# Patient Record
Sex: Female | Born: 1970
Health system: Southern US, Community
[De-identification: ages and names within clinical notes are randomized; demographics above are authoritative.]

## PROBLEM LIST (undated history)

## (undated) DIAGNOSIS — K594 Anal spasm: Secondary | ICD-10-CM

## (undated) DIAGNOSIS — G4730 Sleep apnea, unspecified: Secondary | ICD-10-CM

## (undated) DIAGNOSIS — M545 Low back pain, unspecified: Secondary | ICD-10-CM

## (undated) DIAGNOSIS — M1990 Unspecified osteoarthritis, unspecified site: Secondary | ICD-10-CM

## (undated) DIAGNOSIS — K219 Gastro-esophageal reflux disease without esophagitis: Secondary | ICD-10-CM

## (undated) DIAGNOSIS — K2960 Other gastritis without bleeding: Secondary | ICD-10-CM

## (undated) DIAGNOSIS — N809 Endometriosis, unspecified: Secondary | ICD-10-CM

## (undated) DIAGNOSIS — G4733 Obstructive sleep apnea (adult) (pediatric): Secondary | ICD-10-CM

## (undated) DIAGNOSIS — G8929 Other chronic pain: Secondary | ICD-10-CM

## (undated) DIAGNOSIS — R519 Headache, unspecified: Secondary | ICD-10-CM

## (undated) DIAGNOSIS — G43909 Migraine, unspecified, not intractable, without status migrainosus: Secondary | ICD-10-CM

## (undated) DIAGNOSIS — K644 Residual hemorrhoidal skin tags: Secondary | ICD-10-CM

## (undated) DIAGNOSIS — N83202 Unspecified ovarian cyst, left side: Secondary | ICD-10-CM

## (undated) DIAGNOSIS — T7840XA Allergy, unspecified, initial encounter: Secondary | ICD-10-CM

## (undated) DIAGNOSIS — E079 Disorder of thyroid, unspecified: Secondary | ICD-10-CM

## (undated) DIAGNOSIS — D649 Anemia, unspecified: Secondary | ICD-10-CM

## (undated) DIAGNOSIS — J45909 Unspecified asthma, uncomplicated: Secondary | ICD-10-CM

## (undated) DIAGNOSIS — Z9989 Dependence on other enabling machines and devices: Secondary | ICD-10-CM

## (undated) DIAGNOSIS — R51 Headache: Secondary | ICD-10-CM

## (undated) DIAGNOSIS — Z87898 Personal history of other specified conditions: Secondary | ICD-10-CM

## (undated) HISTORY — DX: Allergy, unspecified, initial encounter: T78.40XA

## (undated) HISTORY — DX: Anal spasm: K59.4

## (undated) HISTORY — DX: Unspecified ovarian cyst, left side: N83.202

## (undated) HISTORY — DX: Residual hemorrhoidal skin tags: K64.4

## (undated) HISTORY — DX: Unspecified asthma, uncomplicated: J45.909

## (undated) HISTORY — DX: Other gastritis without bleeding: K29.60

## (undated) HISTORY — PX: NASAL SEPTUM SURGERY: SHX37

## (undated) HISTORY — DX: Disorder of thyroid, unspecified: E07.9

## (undated) HISTORY — PX: LAPAROSCOPIC OVARIAN CYSTECTOMY: SUR786

## (undated) HISTORY — DX: Anemia, unspecified: D64.9

## (undated) HISTORY — PX: TEMPOROMANDIBULAR JOINT SURGERY: SHX35

## (undated) HISTORY — DX: Endometriosis, unspecified: N80.9

## (undated) HISTORY — DX: Personal history of other specified conditions: Z87.898

## (undated) HISTORY — PX: COLONOSCOPY: SHX174

## (undated) HISTORY — DX: Sleep apnea, unspecified: G47.30

## (undated) HISTORY — PX: UPPER GASTROINTESTINAL ENDOSCOPY: SHX188

---
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
## 1997-10-06 ENCOUNTER — Ambulatory Visit (HOSPITAL_COMMUNITY): Admission: RE | Admit: 1997-10-06 | Discharge: 1997-10-06 | Payer: Self-pay | Admitting: Obstetrics and Gynecology

## 1997-10-16 ENCOUNTER — Inpatient Hospital Stay (HOSPITAL_COMMUNITY): Admission: AD | Admit: 1997-10-16 | Discharge: 1997-10-16 | Payer: Self-pay | Admitting: Obstetrics and Gynecology

## 1997-10-23 ENCOUNTER — Inpatient Hospital Stay (HOSPITAL_COMMUNITY): Admission: AD | Admit: 1997-10-23 | Discharge: 1997-10-25 | Payer: Self-pay | Admitting: Obstetrics and Gynecology

## 1998-01-31 ENCOUNTER — Ambulatory Visit (HOSPITAL_COMMUNITY): Admission: RE | Admit: 1998-01-31 | Discharge: 1998-01-31 | Payer: Self-pay | Admitting: Obstetrics and Gynecology

## 1998-06-06 ENCOUNTER — Emergency Department (HOSPITAL_COMMUNITY): Admission: EM | Admit: 1998-06-06 | Discharge: 1998-06-06 | Payer: Self-pay | Admitting: Emergency Medicine

## 2000-04-10 ENCOUNTER — Encounter: Admission: RE | Admit: 2000-04-10 | Discharge: 2000-04-10 | Payer: Self-pay | Admitting: Surgery

## 2000-04-10 ENCOUNTER — Encounter: Payer: Self-pay | Admitting: Surgery

## 2000-10-01 ENCOUNTER — Encounter: Payer: Self-pay | Admitting: Surgery

## 2000-10-01 ENCOUNTER — Encounter: Admission: RE | Admit: 2000-10-01 | Discharge: 2000-10-01 | Payer: Self-pay | Admitting: Surgery

## 2000-10-05 ENCOUNTER — Other Ambulatory Visit: Admission: RE | Admit: 2000-10-05 | Discharge: 2000-10-05 | Payer: Self-pay | Admitting: Surgery

## 2000-10-05 ENCOUNTER — Encounter: Admission: RE | Admit: 2000-10-05 | Discharge: 2000-10-05 | Payer: Self-pay | Admitting: Surgery

## 2000-10-05 ENCOUNTER — Encounter: Payer: Self-pay | Admitting: Surgery

## 2000-10-05 ENCOUNTER — Encounter (INDEPENDENT_AMBULATORY_CARE_PROVIDER_SITE_OTHER): Payer: Self-pay | Admitting: *Deleted

## 2000-10-05 HISTORY — PX: BREAST BIOPSY: SHX20

## 2000-10-14 ENCOUNTER — Emergency Department (HOSPITAL_COMMUNITY): Admission: EM | Admit: 2000-10-14 | Discharge: 2000-10-15 | Payer: Self-pay | Admitting: Emergency Medicine

## 2000-10-15 ENCOUNTER — Encounter: Payer: Self-pay | Admitting: Emergency Medicine

## 2003-08-30 ENCOUNTER — Ambulatory Visit (HOSPITAL_COMMUNITY): Admission: RE | Admit: 2003-08-30 | Discharge: 2003-08-30 | Payer: Self-pay | Admitting: Family Medicine

## 2007-07-27 ENCOUNTER — Emergency Department (HOSPITAL_COMMUNITY): Admission: EM | Admit: 2007-07-27 | Discharge: 2007-07-27 | Payer: Self-pay | Admitting: Emergency Medicine

## 2007-11-04 ENCOUNTER — Ambulatory Visit (HOSPITAL_COMMUNITY): Admission: EM | Admit: 2007-11-04 | Discharge: 2007-11-04 | Payer: Self-pay | Admitting: Emergency Medicine

## 2007-11-07 ENCOUNTER — Emergency Department (HOSPITAL_COMMUNITY): Admission: EM | Admit: 2007-11-07 | Discharge: 2007-11-07 | Payer: Self-pay | Admitting: Emergency Medicine

## 2007-11-09 ENCOUNTER — Ambulatory Visit (HOSPITAL_COMMUNITY): Admission: RE | Admit: 2007-11-09 | Discharge: 2007-11-09 | Payer: Self-pay | Admitting: Internal Medicine

## 2007-11-10 ENCOUNTER — Ambulatory Visit (HOSPITAL_COMMUNITY): Admission: RE | Admit: 2007-11-10 | Discharge: 2007-11-10 | Payer: Self-pay | Admitting: Internal Medicine

## 2007-11-11 DIAGNOSIS — N83209 Unspecified ovarian cyst, unspecified side: Secondary | ICD-10-CM | POA: Insufficient documentation

## 2007-11-15 ENCOUNTER — Ambulatory Visit: Payer: Self-pay | Admitting: Gastroenterology

## 2007-11-15 DIAGNOSIS — K219 Gastro-esophageal reflux disease without esophagitis: Secondary | ICD-10-CM | POA: Insufficient documentation

## 2007-11-15 DIAGNOSIS — R079 Chest pain, unspecified: Secondary | ICD-10-CM | POA: Insufficient documentation

## 2007-11-15 DIAGNOSIS — R1011 Right upper quadrant pain: Secondary | ICD-10-CM | POA: Insufficient documentation

## 2007-11-15 DIAGNOSIS — K5900 Constipation, unspecified: Secondary | ICD-10-CM | POA: Insufficient documentation

## 2007-11-16 ENCOUNTER — Encounter: Payer: Self-pay | Admitting: Internal Medicine

## 2007-11-16 ENCOUNTER — Ambulatory Visit: Payer: Self-pay | Admitting: Gastroenterology

## 2007-12-07 ENCOUNTER — Telehealth: Payer: Self-pay | Admitting: Gastroenterology

## 2008-01-06 ENCOUNTER — Ambulatory Visit: Payer: Self-pay | Admitting: Gastroenterology

## 2008-01-06 DIAGNOSIS — R109 Unspecified abdominal pain: Secondary | ICD-10-CM | POA: Insufficient documentation

## 2008-01-06 DIAGNOSIS — F411 Generalized anxiety disorder: Secondary | ICD-10-CM | POA: Insufficient documentation

## 2008-01-06 DIAGNOSIS — K594 Anal spasm: Secondary | ICD-10-CM | POA: Insufficient documentation

## 2008-01-10 ENCOUNTER — Telehealth: Payer: Self-pay | Admitting: Gastroenterology

## 2008-01-11 ENCOUNTER — Telehealth: Payer: Self-pay | Admitting: Gastroenterology

## 2008-01-11 ENCOUNTER — Ambulatory Visit: Payer: Self-pay | Admitting: Gastroenterology

## 2008-01-24 ENCOUNTER — Emergency Department (HOSPITAL_COMMUNITY): Admission: EM | Admit: 2008-01-24 | Discharge: 2008-01-24 | Payer: Self-pay | Admitting: Emergency Medicine

## 2008-02-23 ENCOUNTER — Encounter: Admission: RE | Admit: 2008-02-23 | Discharge: 2008-04-24 | Payer: Self-pay | Admitting: Neurology

## 2008-06-19 ENCOUNTER — Emergency Department (HOSPITAL_COMMUNITY): Admission: EM | Admit: 2008-06-19 | Discharge: 2008-06-19 | Payer: Self-pay | Admitting: Emergency Medicine

## 2008-07-25 ENCOUNTER — Telehealth: Payer: Self-pay | Admitting: Gastroenterology

## 2008-08-08 ENCOUNTER — Telehealth: Payer: Self-pay | Admitting: Internal Medicine

## 2008-08-08 ENCOUNTER — Encounter: Payer: Self-pay | Admitting: Gastroenterology

## 2008-08-10 ENCOUNTER — Ambulatory Visit: Payer: Self-pay | Admitting: Gastroenterology

## 2008-08-10 DIAGNOSIS — K2970 Gastritis, unspecified, without bleeding: Secondary | ICD-10-CM | POA: Insufficient documentation

## 2008-08-10 DIAGNOSIS — K2990 Gastroduodenitis, unspecified, without bleeding: Secondary | ICD-10-CM

## 2008-10-09 ENCOUNTER — Emergency Department (HOSPITAL_COMMUNITY): Admission: EM | Admit: 2008-10-09 | Discharge: 2008-10-09 | Payer: Self-pay | Admitting: Emergency Medicine

## 2008-11-10 ENCOUNTER — Ambulatory Visit: Payer: Self-pay | Admitting: Pulmonary Disease

## 2008-11-10 ENCOUNTER — Telehealth: Payer: Self-pay | Admitting: Pulmonary Disease

## 2008-11-10 DIAGNOSIS — R091 Pleurisy: Secondary | ICD-10-CM | POA: Insufficient documentation

## 2008-11-16 LAB — CONVERTED CEMR LAB: Anti Nuclear Antibody(ANA): NEGATIVE

## 2009-01-15 ENCOUNTER — Encounter: Admission: RE | Admit: 2009-01-15 | Discharge: 2009-01-15 | Payer: Self-pay | Admitting: Surgery

## 2010-05-16 ENCOUNTER — Emergency Department (HOSPITAL_COMMUNITY)
Admission: EM | Admit: 2010-05-16 | Discharge: 2010-05-16 | Payer: Self-pay | Source: Home / Self Care | Admitting: Emergency Medicine

## 2010-08-20 LAB — COMPREHENSIVE METABOLIC PANEL
ALT: 13 U/L (ref 0–35)
Alkaline Phosphatase: 80 U/L (ref 39–117)
BUN: 8 mg/dL (ref 6–23)
CO2: 28 mEq/L (ref 19–32)
Chloride: 107 mEq/L (ref 96–112)
GFR calc non Af Amer: >60 mL/min (ref >60)
Glucose, Bld: 95 mg/dL (ref 70–99)
Potassium: 4 mEq/L (ref 3.5–5.1)
Sodium: 140 mEq/L (ref 135–145)
Total Bilirubin: 0.4 mg/dL (ref 0.3–1.2)

## 2010-08-20 LAB — POCT CARDIAC MARKERS: Myoglobin, poc: 49.8 ng/mL (ref 12–200)

## 2010-09-23 LAB — POCT I-STAT, CHEM 8
BUN: 13 mg/dL (ref 6–23)
Calcium, Ion: 1.14 mmol/L (ref 1.12–1.32)
Chloride: 105 mEq/L (ref 96–112)
Glucose, Bld: 91 mg/dL (ref 70–99)
TCO2: 23 mmol/L (ref 0–100)

## 2010-09-23 LAB — CBC
HCT: 34.6 % — ABNORMAL LOW (ref 36.0–46.0)
MCHC: 33.9 g/dL (ref 30.0–36.0)
MCV: 80.5 fL (ref 78.0–100.0)
Platelets: 358 10*3/uL (ref 150–400)

## 2010-09-23 LAB — DIFFERENTIAL
Basophils Relative: 0 % (ref 0–1)
Eosinophils Absolute: 0.2 10*3/uL (ref 0.0–0.7)
Eosinophils Relative: 2 % (ref 0–5)
Lymphs Abs: 1.7 10*3/uL (ref 0.7–4.0)
Monocytes Relative: 7 % (ref 3–12)
Neutrophils Relative %: 73 % (ref 43–77)

## 2010-09-23 LAB — URINALYSIS, ROUTINE W REFLEX MICROSCOPIC
Glucose, UA: NEGATIVE mg/dL
Hgb urine dipstick: NEGATIVE
Ketones, ur: NEGATIVE mg/dL
Protein, ur: NEGATIVE mg/dL
Urobilinogen, UA: 0.2 mg/dL (ref 0.0–1.0)

## 2010-09-23 LAB — POCT PREGNANCY, URINE: Preg Test, Ur: NEGATIVE

## 2011-02-18 ENCOUNTER — Emergency Department (HOSPITAL_BASED_OUTPATIENT_CLINIC_OR_DEPARTMENT_OTHER)
Admission: EM | Admit: 2011-02-18 | Discharge: 2011-02-18 | Disposition: A | Discharge status: Home / Self Care | Payer: BC Managed Care – PPO | Attending: Emergency Medicine | Admitting: Emergency Medicine

## 2011-02-18 ENCOUNTER — Emergency Department (INDEPENDENT_AMBULATORY_CARE_PROVIDER_SITE_OTHER): Payer: BC Managed Care – PPO

## 2011-02-18 ENCOUNTER — Encounter (HOSPITAL_BASED_OUTPATIENT_CLINIC_OR_DEPARTMENT_OTHER): Payer: Self-pay | Admitting: Emergency Medicine

## 2011-02-18 DIAGNOSIS — W208XXA Other cause of strike by thrown, projected or falling object, initial encounter: Secondary | ICD-10-CM | POA: Insufficient documentation

## 2011-02-18 DIAGNOSIS — R110 Nausea: Secondary | ICD-10-CM

## 2011-02-18 DIAGNOSIS — R42 Dizziness and giddiness: Secondary | ICD-10-CM

## 2011-02-18 DIAGNOSIS — S060X9A Concussion with loss of consciousness of unspecified duration, initial encounter: Secondary | ICD-10-CM

## 2011-02-18 DIAGNOSIS — H538 Other visual disturbances: Secondary | ICD-10-CM

## 2011-02-18 DIAGNOSIS — S098XXA Other specified injuries of head, initial encounter: Secondary | ICD-10-CM

## 2011-02-18 DIAGNOSIS — R51 Headache: Secondary | ICD-10-CM

## 2011-02-18 DIAGNOSIS — S060XAA Concussion with loss of consciousness status unknown, initial encounter: Secondary | ICD-10-CM | POA: Insufficient documentation

## 2011-02-18 LAB — CBC
HCT: 37 % (ref 36.0–46.0)
MCH: 26.7 pg (ref 26.0–34.0)
MCHC: 32.7 g/dL (ref 30.0–36.0)
MCV: 81.5 fL (ref 78.0–100.0)
Platelets: 387 10*3/uL (ref 150–400)
RDW: 14.7 % (ref 11.5–15.5)

## 2011-02-18 LAB — DIFFERENTIAL
Basophils Absolute: 0 10*3/uL (ref 0.0–0.1)
Basophils Relative: 1 % (ref 0–1)
Eosinophils Absolute: 0.2 10*3/uL (ref 0.0–0.7)
Eosinophils Relative: 2 % (ref 0–5)
Monocytes Absolute: 0.6 10*3/uL (ref 0.1–1.0)

## 2011-02-18 MED ORDER — HYDROCODONE-ACETAMINOPHEN 5-500 MG PO TABS: 1.0000 | ORAL_TABLET | Freq: Four times a day (QID) | ORAL | Status: AC | PRN

## 2011-02-18 MED ORDER — SODIUM CHLORIDE 0.9 % IV BOLUS (SEPSIS)
1000.0000 mL | Freq: Once | INTRAVENOUS | Status: AC
  Administered 2011-02-18: 1000 mL via INTRAVENOUS

## 2011-02-18 MED ORDER — ONDANSETRON HCL 4 MG/2ML IJ SOLN
4.0000 mg | Freq: Once | INTRAMUSCULAR | Status: AC
  Administered 2011-02-18: 4 mg via INTRAVENOUS
  Filled 2011-02-18: qty 2

## 2011-02-18 MED ORDER — KETOROLAC TROMETHAMINE 30 MG/ML IJ SOLN
30.0000 mg | Freq: Once | INTRAMUSCULAR | Status: AC
  Administered 2011-02-18: 30 mg via INTRAVENOUS
  Filled 2011-02-18: qty 1

## 2011-02-18 MED ORDER — IBUPROFEN 600 MG PO TABS: 600.0000 mg | ORAL_TABLET | Freq: Four times a day (QID) | ORAL | Status: AC | PRN

## 2011-02-18 MED ORDER — MORPHINE SULFATE 4 MG/ML IJ SOLN
4.0000 mg | Freq: Once | INTRAMUSCULAR | Status: AC
  Administered 2011-02-18: 4 mg via INTRAVENOUS
  Filled 2011-02-18: qty 1

## 2011-02-18 MED ORDER — ONDANSETRON 4 MG PO TBDP: 4.0000 mg | ORAL_TABLET | Freq: Three times a day (TID) | ORAL | Status: AC | PRN

## 2011-02-18 NOTE — ED Provider Notes (Signed)
History     CSN: [id number] Arrival date & time: 02/18/2011 10:51 AM  Chief Complaint  Patient presents with  . Head Injury    Pt reports being hit by solid glass to R side of Body and head   HPI Comments: 40yoF previously healthy pw headache and nausea. Per pt 2 days ago she was working on [place] cabinet and the door fell on her back/occiput. The glass did shatter. C/O headache since that time, diffuse. Worse with movement. +Nausea, no vomiting. Denies numbness/tingling/weakness extremities. C/O min numbness R face. C/O diffuse upper back soreness +laceration to R shoulder which is now healing. Denies change in vision, fever/chills/neck stiffness. No recent illness. Family requesting check CBC as family member recently died 2 days after being diagnosed with leukemia Tet UTD. The injury was not to the Rt side of the body as noted below  Marinell Blight, NT 02/18/2011 10:45     Pt reports Blunt injury to R side of Body with increased headache and body aches    Patient is a 40 y.o. female presenting with head injury.  Head Injury     History reviewed. No pertinent past medical history.  Past Surgical History  Procedure Date  . Cesarean section     History reviewed. No pertinent family history.  History  Substance Use Topics  . Smoking status: Never Smoker   . Smokeless tobacco: Not on file  . Alcohol Use: No    OB History    Grav Para Term Preterm Abortions TAB SAB Ect Mult Living                  Review of Systems  All other systems reviewed and are negative.  except as noted HPI   Physical Exam  BP 127/77  Pulse 88  Temp(Src) 98.6 F (37 C) (Oral)  Resp 18  SpO2 100%  LMP 02/18/2011  Physical Exam  Nursing note and vitals reviewed. Constitutional: She is oriented to person, place, and time. She appears well-developed.  HENT:  Head: Normocephalic and atraumatic.  Right Ear: External ear normal.  Left Ear: External ear normal.  Nose: Nose normal.    Mouth/Throat: Oropharynx is clear and moist. No oropharyngeal exudate.       No scalp hematoma +ttp posterior occiput No wound noted  Eyes: Conjunctivae and EOM are normal. Pupils are equal, round, and reactive to light.  Neck: Normal range of motion. Neck supple.       No c/t/l/s ttp  Cardiovascular: Normal rate, regular rhythm, normal heart sounds and intact distal pulses.   Pulmonary/Chest: Effort normal and breath sounds normal. No respiratory distress. She has no wheezes. She has no rales.  Abdominal: Soft. She exhibits no distension. There is no tenderness. There is no rebound and no guarding.  Musculoskeletal: Normal range of motion. She exhibits tenderness. She exhibits no edema.       +diffuse upper back ttp  Neurological: She is alert and oriented to person, place, and time. No cranial nerve deficit. She exhibits normal muscle tone. Coordination normal.       Strength 5/5 all extremities No pronator drift No facial droop   Skin: Skin is warm and dry. No rash noted.       R shoulder with healing superficial abrasion. No surrounding erythema   Psychiatric: She has a normal mood and affect.   Results for orders placed during the hospital encounter of 02/18/11  CBC      Component Value Range  WBC 8.0  4.0 - 10.5 (K/uL)   RBC 4.54  3.87 - 5.11 (MIL/uL)   Hemoglobin 12.1  12.0 - 15.0 (g/dL)   HCT 16.1  09.6 - 04.5 (%)   MCV 81.5  78.0 - 100.0 (fL)   MCH 26.7  26.0 - 34.0 (pg)   MCHC 32.7  30.0 - 36.0 (g/dL)   RDW 40.9  81.1 - 91.4 (%)   Platelets 387  150 - 400 (K/uL)  DIFFERENTIAL      Component Value Range   Neutrophils Relative 66  43 - 77 (%)   Neutro Abs 5.3  1.7 - 7.7 (K/uL)   Lymphocytes Relative 23  12 - 46 (%)   Lymphs Abs 1.9  0.7 - 4.0 (K/uL)   Monocytes Relative 8  3 - 12 (%)   Monocytes Absolute 0.6  0.1 - 1.0 (K/uL)   Eosinophils Relative 2  0 - 5 (%)   Eosinophils Absolute 0.2  0.0 - 0.7 (K/uL)   Basophils Relative 1  0 - 1 (%)   Basophils Absolute  0.0  0.0 - 0.1 (K/uL)   Ct Head Wo Contrast  02/18/2011  *RADIOLOGY REPORT*  Clinical Data: Blunt trauma to head two days ago.  Headache, dizziness, blurred vision and nausea.  CT HEAD WITHOUT CONTRAST  Technique:  Contiguous axial images were obtained from the base of the skull through the vertex without contrast.  Comparison: MRI brain 01/28/2008.  Findings: There is no evidence for acute infarction, intracranial hemorrhage, mass lesion, hydrocephalus, or extra-axial fluid. There is no atrophy or white matter disease.  I see no radiopaque foreign body in the scalp or significant scalp hematoma.  Calvarium is intact.  Sinuses and mastoids are clear. There is been previous facial surgery and there is evidence of prior nasal fractures.  IMPRESSION: Negative exam.  No change from prior normal MR.  Original Report Authenticated By: Elsie Stain, M.D.     ED Course  Procedures  MDM S/p blunt head trauma pw persistent headache, nausea.  DDx incl post concussive syndrome v/s ICH. Will treat with morphine, IVF, zofran. Reassess. Will check CBC per family's request. Anticipate d/c home with PMD f/u  Stefano Gaul, MD   1:20 PM  CT reviewed. No ICH. Pain improved but cont 5/10. Will give toradol. Pain control and dc home   2:02 PM  Feeling better. Still has "some burning at the back of my head". Feels comfortable with discharge. Will have her f/u PMD  Stefano Gaul, MD   Forbes Cellar, MD 02/18/11 [phone]

## 2011-02-18 NOTE — ED Notes (Signed)
Pt reports Blunt injury to R side of Body with increased headache and body aches

## 2011-02-18 NOTE — ED Notes (Signed)
Patient is resting comfortably.Markedly Improved overall

## 2011-02-18 NOTE — ED Notes (Signed)
Patient is resting comfortably.Pt states "medication helped a lot Im feeling better"

## 2011-02-18 NOTE — ED Notes (Signed)
Pt reports "feeling Better" tolerating PO fluids well

## 2011-03-05 LAB — COMPREHENSIVE METABOLIC PANEL
ALT: 11
AST: 16
Albumin: 3.7
Calcium: 8.7
Chloride: 103
Creatinine, Ser: 0.85
GFR calc Af Amer: >60
Sodium: 138
Total Bilirubin: 0.7

## 2011-03-05 LAB — URINALYSIS, ROUTINE W REFLEX MICROSCOPIC
Glucose, UA: NEGATIVE
Ketones, ur: NEGATIVE
Leukocytes, UA: NEGATIVE
Nitrite: NEGATIVE
Specific Gravity, Urine: 1.011
pH: 6

## 2011-03-05 LAB — URINE MICROSCOPIC-ADD ON

## 2011-03-05 LAB — CBC
MCV: 84.7
Platelets: 354
WBC: 9.8

## 2011-03-05 LAB — DIFFERENTIAL
Eosinophils Absolute: 0.2
Eosinophils Relative: 3
Lymphocytes Relative: 29
Lymphs Abs: 2.8
Monocytes Absolute: 0.7

## 2011-07-14 ENCOUNTER — Other Ambulatory Visit: Payer: Self-pay | Admitting: Specialist

## 2011-07-14 DIAGNOSIS — M5126 Other intervertebral disc displacement, lumbar region: Secondary | ICD-10-CM

## 2011-07-31 ENCOUNTER — Other Ambulatory Visit: Payer: BC Managed Care – PPO

## 2011-07-31 ENCOUNTER — Ambulatory Visit
Admission: RE | Admit: 2011-07-31 | Discharge: 2011-07-31 | Disposition: A | Discharge status: Home / Self Care | Payer: BC Managed Care – PPO | Source: Ambulatory Visit | Attending: Specialist | Admitting: Specialist

## 2011-07-31 VITALS — BP 106/60 | HR 73

## 2011-07-31 DIAGNOSIS — M549 Dorsalgia, unspecified: Secondary | ICD-10-CM

## 2011-07-31 DIAGNOSIS — M5126 Other intervertebral disc displacement, lumbar region: Secondary | ICD-10-CM

## 2011-07-31 MED ORDER — DIAZEPAM 5 MG PO TABS
10.0000 mg | ORAL_TABLET | Freq: Once | ORAL | Status: AC
  Administered 2011-07-31: 10 mg via ORAL

## 2011-07-31 MED ORDER — MEPERIDINE HCL 100 MG/ML IJ SOLN
75.0000 mg | Freq: Once | INTRAMUSCULAR | Status: AC
  Administered 2011-07-31: 75 mg via INTRAMUSCULAR

## 2011-07-31 MED ORDER — RANITIDINE HCL 150 MG/10ML PO SYRP
150.0000 mg | ORAL_SOLUTION | Freq: Once | ORAL | Status: AC
  Administered 2011-07-31: 150 mg via ORAL

## 2011-07-31 MED ORDER — ONDANSETRON HCL 8 MG PO TABS
8.0000 mg | ORAL_TABLET | Freq: Once | ORAL | Status: AC
  Administered 2011-07-31: 8 mg via ORAL

## 2011-07-31 NOTE — Discharge Instructions (Signed)
Myelogram Discharge Instructions  1. Go home and rest quietly for the next 24 hours.  It is important to lie flat for the next 24 hours.  Get up only to go to the restroom.  You may lie in the bed or on a couch on your back, your stomach, your left side or your right side.  You may have one pillow under your head.  You may have pillows between your knees while you are on your side or under your knees while you are on your back.  2. DO NOT drive today.  Recline the seat as far back as it will go, while still wearing your seat belt, on the way home.  3. You may get up to go to the bathroom as needed.  You may sit up for 10 minutes to eat.  You may resume your normal diet and medications unless otherwise indicated.  Drink lots of extra fluids today and tomorrow.  4. The incidence of headache, nausea, or vomiting is about 5% (one in 20 patients).  If you develop a headache, lie flat and drink plenty of fluids until the headache goes away.  Caffeinated beverages may be helpful.  If you develop severe nausea and vomiting or a headache that does not go away with flat bed rest, call [phone].  5. You may resume normal activities after your 24 hours of bed rest is over; however, do not exert yourself strongly or do any heavy lifting tomorrow.  6. Call your physician for a follow-up appointment.  The results of your myelogram will be sent directly to your physician by the following day.  7. If you have any questions or if complications develop after you arrive home, please call [phone].  Discharge instructions have been explained to the patient.  The patient, or the person responsible for the patient, fully understands these instructions.      

## 2012-04-05 ENCOUNTER — Encounter (HOSPITAL_BASED_OUTPATIENT_CLINIC_OR_DEPARTMENT_OTHER): Payer: Self-pay | Admitting: *Deleted

## 2012-04-05 ENCOUNTER — Emergency Department (HOSPITAL_BASED_OUTPATIENT_CLINIC_OR_DEPARTMENT_OTHER)
Admission: EM | Admit: 2012-04-05 | Discharge: 2012-04-06 | Disposition: A | Discharge status: Home / Self Care | Payer: BC Managed Care – PPO | Attending: Emergency Medicine | Admitting: Emergency Medicine

## 2012-04-05 ENCOUNTER — Emergency Department (HOSPITAL_BASED_OUTPATIENT_CLINIC_OR_DEPARTMENT_OTHER): Payer: BC Managed Care – PPO

## 2012-04-05 DIAGNOSIS — Z791 Long term (current) use of non-steroidal anti-inflammatories (NSAID): Secondary | ICD-10-CM | POA: Insufficient documentation

## 2012-04-05 DIAGNOSIS — R109 Unspecified abdominal pain: Secondary | ICD-10-CM | POA: Insufficient documentation

## 2012-04-05 LAB — URINALYSIS, ROUTINE W REFLEX MICROSCOPIC
Bilirubin Urine: NEGATIVE
Nitrite: NEGATIVE
Specific Gravity, Urine: 1.017 (ref 1.005–1.030)
Urobilinogen, UA: 0.2 mg/dL (ref 0.0–1.0)

## 2012-04-05 NOTE — ED Notes (Signed)
Left upper leg pain x 5 days. Pain has gradually gotten worse over the past 2 days. No known injury. Pain goes into her vagina. She denies vaginal discharge or swelling.

## 2012-04-05 NOTE — ED Provider Notes (Addendum)
History   This chart was scribed for [name]. This patient was seen in room Room/bed info not found and the patient's care was started at 10:34 PM.    CSN: [id number]  Arrival date & time 04/05/12  2207   None     Chief Complaint  Patient presents with  . Leg Pain    The history is provided by the patient. No language interpreter was used.    Lorraine Conrad is a 41 y.o. female who presents to the Emergency Department complaining of 5 days of gradual onset, intermittent leg pain that radiates to her suprapubic region. Pain has gradually gotten worse for the past 2 days. Pt rates her pain as 10/10 when she first got here and now she rates it as 8/10. She denies any known injuries. She states frequent urination started tonight. Pt denies fever, sore thorat, cough, CP, vomit, diarrhea, and rash. Pt denies smoking and alcohol use.    History reviewed. No pertinent past medical history.  Past Surgical History  Procedure Date  . Cesarean section     No family history on file.  History  Substance Use Topics  . Smoking status: Never Smoker   . Smokeless tobacco: Not on file  . Alcohol Use: No    No OB histroy provided  Review of Systems  Constitutional: Negative for fever and chills.  HENT: Negative for sore throat, rhinorrhea and neck pain.   Gastrointestinal: Negative for nausea, vomiting, diarrhea and blood in stool.  Genitourinary: Positive for frequency (started tonight).    Allergies  Promethazine hcl; Codeine; and Azithromycin  Home Medications   Current Outpatient Rx  Name Route Sig Dispense Refill  . NABUMETONE 500 MG PO TABS Oral Take 500 mg by mouth 2 (two) times daily.      BP 141/91  Pulse 95  Temp 98 F (36.7 C) (Oral)  Resp 20  SpO2 99%  LMP 03/22/2012  Physical Exam  Constitutional: She is oriented to person, place, and time. She appears distressed (moderate ).       Mild obesity   HENT:  Head:  Normocephalic and atraumatic.  Right Ear: External ear normal.  Left Ear: External ear normal.  Mouth/Throat: Oropharynx is clear and moist.  Eyes: EOM are normal. Pupils are equal, round, and reactive to light.  Neck: Normal range of motion. Neck supple.  Cardiovascular: Normal rate, regular rhythm and normal heart sounds.   Pulmonary/Chest: Effort normal and breath sounds normal.  Abdominal: Soft. She exhibits no distension.       Mild suprapubic tenderness, no mass or rebound.  Genitourinary:       Suprapubic region pain with no mass or tenderness.   Musculoskeletal: She exhibits no edema and no tenderness.       Mild tenderness over left thigh over saphenous vein.   Neurological: She is alert and oriented to person, place, and time.       No sensory or motor deficit.  Skin: Skin is warm and dry.  Psychiatric: She has a normal mood and affect. Her behavior is normal.    ED Course  Procedures (including critical care time)  Labs Reviewed - No data to display No results found.  DIAGNOSTIC STUDIES: Oxygen Saturation is 99% on room air, normal by my interpretation.    COORDINATION OF CARE: 10:37 PM Discussed treatment plan which includes test for DVT with pt at bedside and pt agreed to plan.  12:23 AM  UA and UPG negative.  Venous doppler study of left leg negative.  CBC normal.    12:36 AM Results for orders placed during the hospital encounter of 04/05/12  CBC WITH DIFFERENTIAL      Component Value Range   WBC 9.9  4.0 - 10.5 K/uL   RBC 4.41  3.87 - 5.11 MIL/uL   Hemoglobin 11.8 (*) 12.0 - 15.0 g/dL   HCT 16.1  09.6 - 04.5 %   MCV 82.3  78.0 - 100.0 fL   MCH 26.8  26.0 - 34.0 pg   MCHC 32.5  30.0 - 36.0 g/dL   RDW 40.9  81.1 - 91.4 %   Platelets 406 (*) 150 - 400 K/uL   Neutrophils Relative 65  43 - 77 %   Neutro Abs 6.5  1.7 - 7.7 K/uL   Lymphocytes Relative 25  12 - 46 %   Lymphs Abs 2.5  0.7 - 4.0 K/uL   Monocytes Relative 7  3 - 12 %   Monocytes Absolute 0.7   0.1 - 1.0 K/uL   Eosinophils Relative 2  0 - 5 %   Eosinophils Absolute 0.2  0.0 - 0.7 K/uL   Basophils Relative 0  0 - 1 %   Basophils Absolute 0.0  0.0 - 0.1 K/uL  COMPREHENSIVE METABOLIC PANEL      Component Value Range   Sodium 138  135 - 145 mEq/L   Potassium 3.7  3.5 - 5.1 mEq/L   Chloride 103  96 - 112 mEq/L   CO2 27  19 - 32 mEq/L   Glucose, Bld 101 (*) 70 - 99 mg/dL   BUN 7  6 - 23 mg/dL   Creatinine, Ser 7.82  0.50 - 1.10 mg/dL   Calcium 9.0  8.4 - 95.6 mg/dL   Total Protein 7.1  6.0 - 8.3 g/dL   Albumin 3.6  3.5 - 5.2 g/dL   AST 12  0 - 37 U/L   ALT 12  0 - 35 U/L   Alkaline Phosphatase 86  39 - 117 U/L   Total Bilirubin 0.2 (*) 0.3 - 1.2 mg/dL   GFR calc non Af Amer 80 (*) >90 mL/min   GFR calc Af Amer >90  >90 mL/min  LIPASE, BLOOD      Component Value Range   Lipase 26  11 - 59 U/L  URINALYSIS, ROUTINE W REFLEX MICROSCOPIC      Component Value Range   Color, Urine YELLOW  YELLOW   APPearance CLEAR  CLEAR   Specific Gravity, Urine 1.017  1.005 - 1.030   pH 7.0  5.0 - 8.0   Glucose, UA NEGATIVE  NEGATIVE mg/dL   Hgb urine dipstick NEGATIVE  NEGATIVE   Bilirubin Urine NEGATIVE  NEGATIVE   Ketones, ur NEGATIVE  NEGATIVE mg/dL   Protein, ur NEGATIVE  NEGATIVE mg/dL   Urobilinogen, UA 0.2  0.0 - 1.0 mg/dL   Nitrite NEGATIVE  NEGATIVE   Leukocytes, UA NEGATIVE  NEGATIVE  PREGNANCY, URINE      Component Value Range   Preg Test, Ur NEGATIVE  NEGATIVE   US Venous Img Lower Unilateral Left  04/05/2012  *RADIOLOGY REPORT*  Clinical Data: Leg pain.  LEFT LOWER EXTREMITY VENOUS DUPLEX ULTRASOUND  Technique:  Gray-scale sonography with graded compression, as well as color Doppler and duplex ultrasound were performed to evaluate the deep venous system of the lower extremity from the level of the common femoral vein through the popliteal and proximal  calf veins. Spectral Doppler was utilized to evaluate flow at rest and with distal augmentation maneuvers.  Comparison:   None.  Findings:  Normal compressibility of the common femoral, superficial femoral, and popliteal veins is demonstrated, as well as the visualized proximal calf veins.  No filling defects to suggest DVT on grayscale or color Doppler imaging.  Doppler waveforms show normal direction of venous flow, normal respiratory phasicity and response to augmentation.  IMPRESSION: No evidence of lower extremity deep vein thrombosis.   Original Report Authenticated By: Bernadene Bell. Maricela Curet, M.D.     Lab tests did not show a cause for pt's pain in the lower abdomen and left thigh.  I advised that we could to pelvic exam to check for a cause of her pain in her female organs, but she preferred to followup that avenue with her OBGYN, Dr. Allena Katz.  Rx hydrocodone-acetaminophen q4h prn pain.    1. Abdominal pain of unknown etiology    I personally performed the services described in this documentation, which was scribed in my presence. The recorded information has been reviewed and considered.  Osvaldo Human, MD          Carleene Cooper III, MD 04/06/12 0045  Carleene Cooper III, MD 04/06/12 [phone]

## 2012-04-06 LAB — CBC WITH DIFFERENTIAL/PLATELET
Eosinophils Absolute: 0.2 10*3/uL (ref 0.0–0.7)
Eosinophils Relative: 2 % (ref 0–5)
Hemoglobin: 11.8 g/dL — ABNORMAL LOW (ref 12.0–15.0)
Lymphs Abs: 2.5 10*3/uL (ref 0.7–4.0)
MCH: 26.8 pg (ref 26.0–34.0)
MCV: 82.3 fL (ref 78.0–100.0)
Monocytes Absolute: 0.7 10*3/uL (ref 0.1–1.0)
Monocytes Relative: 7 % (ref 3–12)
RBC: 4.41 MIL/uL (ref 3.87–5.11)

## 2012-04-06 LAB — COMPREHENSIVE METABOLIC PANEL
Alkaline Phosphatase: 86 U/L (ref 39–117)
BUN: 7 mg/dL (ref 6–23)
Calcium: 9 mg/dL (ref 8.4–10.5)
Creatinine, Ser: 0.9 mg/dL (ref 0.50–1.10)
GFR calc Af Amer: >90 mL/min (ref >90)
Glucose, Bld: 101 mg/dL — ABNORMAL HIGH (ref 70–99)
Total Protein: 7.1 g/dL (ref 6.0–8.3)

## 2012-04-06 LAB — LIPASE, BLOOD: Lipase: 26 U/L (ref 11–59)

## 2012-04-06 MED ORDER — HYDROCODONE-ACETAMINOPHEN 5-325 MG PO TABS
1.0000 | ORAL_TABLET | Freq: Once | ORAL | Status: AC
  Administered 2012-04-06: 1 via ORAL
  Filled 2012-04-06: qty 1

## 2012-04-06 MED ORDER — HYDROCODONE-ACETAMINOPHEN 5-325 MG PO TABS: 1.0000 | ORAL_TABLET | ORAL | Status: DC | PRN

## 2012-04-07 LAB — URINE CULTURE

## 2012-06-09 DIAGNOSIS — Z87898 Personal history of other specified conditions: Secondary | ICD-10-CM

## 2012-06-09 HISTORY — DX: Personal history of other specified conditions: Z87.898

## 2012-09-09 ENCOUNTER — Other Ambulatory Visit: Payer: Self-pay

## 2012-09-09 DIAGNOSIS — Z1231 Encounter for screening mammogram for malignant neoplasm of breast: Secondary | ICD-10-CM

## 2012-10-09 ENCOUNTER — Emergency Department (HOSPITAL_COMMUNITY)
Admission: EM | Admit: 2012-10-09 | Discharge: 2012-10-09 | Disposition: A | Discharge status: Home / Self Care | Payer: BC Managed Care – PPO | Attending: Emergency Medicine | Admitting: Emergency Medicine

## 2012-10-09 ENCOUNTER — Emergency Department (HOSPITAL_COMMUNITY): Payer: BC Managed Care – PPO

## 2012-10-09 ENCOUNTER — Encounter (HOSPITAL_COMMUNITY): Payer: Self-pay | Admitting: Emergency Medicine

## 2012-10-09 DIAGNOSIS — R110 Nausea: Secondary | ICD-10-CM | POA: Insufficient documentation

## 2012-10-09 DIAGNOSIS — K2100 Gastro-esophageal reflux disease with esophagitis, without bleeding: Secondary | ICD-10-CM

## 2012-10-09 DIAGNOSIS — G43909 Migraine, unspecified, not intractable, without status migrainosus: Secondary | ICD-10-CM | POA: Insufficient documentation

## 2012-10-09 HISTORY — DX: Gastro-esophageal reflux disease without esophagitis: K21.9

## 2012-10-09 LAB — CBC WITH DIFFERENTIAL/PLATELET
Eosinophils Absolute: 0.2 10*3/uL (ref 0.0–0.7)
Eosinophils Relative: 2 % (ref 0–5)
HCT: 36 % (ref 36.0–46.0)
Lymphocytes Relative: 26 % (ref 12–46)
Lymphs Abs: 2.7 10*3/uL (ref 0.7–4.0)
MCH: 27.8 pg (ref 26.0–34.0)
MCV: 82 fL (ref 78.0–100.0)
Monocytes Absolute: 0.6 10*3/uL (ref 0.1–1.0)
RBC: 4.39 MIL/uL (ref 3.87–5.11)
RDW: 14.1 % (ref 11.5–15.5)
WBC: 10.5 10*3/uL (ref 4.0–10.5)

## 2012-10-09 LAB — COMPREHENSIVE METABOLIC PANEL
AST: 14 U/L (ref 0–37)
Albumin: 3.8 g/dL (ref 3.5–5.2)
Alkaline Phosphatase: 92 U/L (ref 39–117)
BUN: 6 mg/dL (ref 6–23)
Chloride: 100 mEq/L (ref 96–112)
Potassium: 4 mEq/L (ref 3.5–5.1)
Sodium: 137 mEq/L (ref 135–145)
Total Bilirubin: 0.2 mg/dL — ABNORMAL LOW (ref 0.3–1.2)
Total Protein: 7.5 g/dL (ref 6.0–8.3)

## 2012-10-09 MED ORDER — OMEPRAZOLE 20 MG PO CPDR: 20.0000 mg | DELAYED_RELEASE_CAPSULE | Freq: Every day | ORAL | Status: DC

## 2012-10-09 MED ORDER — ONDANSETRON 4 MG PO TBDP
4.0000 mg | ORAL_TABLET | Freq: Once | ORAL | Status: AC
  Administered 2012-10-09: 4 mg via ORAL
  Filled 2012-10-09: qty 1

## 2012-10-09 MED ORDER — HYDROCODONE-ACETAMINOPHEN 5-325 MG PO TABS: 2.0000 | ORAL_TABLET | ORAL | Status: DC | PRN

## 2012-10-09 MED ORDER — ONDANSETRON 4 MG PO TBDP: 4.0000 mg | ORAL_TABLET | Freq: Once | ORAL | Status: DC

## 2012-10-09 NOTE — ED Notes (Signed)
Pt c/o center chest pain that radiated down left arm and through shoulder blades. Pain accompanied by diaphoresis, shortness of breath and nausea onset last Saturday. Pt reports experienced headache on Monday and Tuesday. Family reports pt has had chest pain on and off all week. Pt took 1000 mg of tylenol earlier today.

## 2012-10-09 NOTE — ED Provider Notes (Signed)
History     CSN: [id number]  Arrival date & time 10/09/12  1753   First MD Initiated Contact with Patient 10/09/12 1759      Chief Complaint  Patient presents with  . Chest Pain  . Nausea     HPI Pt c/o center chest pain that radiated down left arm and through shoulder blades. Pain accompanied by diaphoresis, shortness of breath and nausea onset last Saturday. Pt reports experienced headache on Monday and Tuesday. Family reports pt has had chest pain on and off all week. Pt took 1000 mg of tylenol earlier today.  Past Medical History  Diagnosis Date  . GERD (gastroesophageal reflux disease)   . Headache     Past Surgical History  Procedure Laterality Date  . Cesarean section      No family history on file.  History  Substance Use Topics  . Smoking status: Never Smoker   . Smokeless tobacco: Not on file  . Alcohol Use: No    OB History   Grav Para Term Preterm Abortions TAB SAB Ect Mult Living                  Review of Systems All other systems reviewed and are negative Allergies  Promethazine hcl; Codeine; and Azithromycin  Home Medications   Current Outpatient Rx  Name  Route  Sig  Dispense  Refill  . acetaminophen (TYLENOL) 500 MG tablet   Oral   Take 1,000 mg by mouth 2 (two) times daily as needed for pain. For pain         . ibuprofen (ADVIL,MOTRIN) 200 MG tablet   Oral   Take 400 mg by mouth every 6 (six) hours as needed for pain. For pain         . HYDROcodone-acetaminophen (NORCO/VICODIN) 5-325 MG per tablet   Oral   Take 2 tablets by mouth every 4 (four) hours as needed for pain.   10 tablet   0   . omeprazole (PRILOSEC) 20 MG capsule   Oral   Take 1 capsule (20 mg total) by mouth daily.   30 capsule   0   . ondansetron (ZOFRAN-ODT) 4 MG disintegrating tablet   Oral   Take 1 tablet (4 mg total) by mouth once.   30 tablet   1     BP 129/83  Pulse 91  Resp 20  SpO2 98%  LMP 09/21/2012  Physical Exam  Nursing note and  vitals reviewed. Constitutional: She is oriented to person, place, and time. She appears well-developed and well-nourished. No distress.  HENT:  Head: Normocephalic and atraumatic.  Eyes: Pupils are equal, round, and reactive to light.  Neck: Normal range of motion.  Cardiovascular: Normal rate and intact distal pulses.   Pulmonary/Chest: No respiratory distress.  Abdominal: Normal appearance. She exhibits no distension. There is no tenderness. There is no rebound.  Musculoskeletal: Normal range of motion.  Neurological: She is alert and oriented to person, place, and time. No cranial nerve deficit.  Skin: Skin is warm and dry. No rash noted.  Psychiatric: She has a normal mood and affect. Her behavior is normal.    ED Course  Procedures (including critical care time)  Date: 10/09/2012  Rate: 88  Rhythm: normal sinus rhythm  QRS Axis: normal  Intervals: normal  ST/T Wave abnormalities: Nonspecific T wave abnormality in lead 3  Conduction Disutrbances: none  Narrative Interpretation: Nonspecific T wave abnormality     Labs Reviewed  COMPREHENSIVE METABOLIC  PANEL - Abnormal; Notable for the following:    Total Bilirubin 0.2 (*)    GFR calc non Af Amer 77 (*)    GFR calc Af Amer 89 (*)    All other components within normal limits  CBC WITH DIFFERENTIAL - Abnormal; Notable for the following:    Platelets 418 (*)    All other components within normal limits  D-DIMER, QUANTITATIVE  POCT I-STAT TROPONIN I   Ct Head Wo Contrast  10/09/2012  *RADIOLOGY REPORT*  Clinical Data: Head pain, pressure  CT HEAD WITHOUT CONTRAST  Technique:  Contiguous axial images were obtained from the base of the skull through the vertex without contrast.  Comparison: Prior head CT 02/18/2011  Findings: No acute intracranial hemorrhage, acute infarction, mass lesion, mass effect, midline shift or hydrocephalus.  Gray-white differentiation is preserved throughout.  No focal soft tissue or calvarial  abnormality.  The globes are intact, the orbits are unremarkable.  Normal aeration of the mastoid air cells and paranasal sinuses.  IMPRESSION: Negative head CT.   Original Report Authenticated By: Malachy Moan, M.D.      1. Reflux esophagitis   2. Migraine       MDM         [name], MD 10/09/12 2056

## 2012-10-09 NOTE — ED Notes (Signed)
Assisted up to the bathroom.  Back to bed and stated that her head was throbbing.

## 2012-10-09 NOTE — ED Notes (Addendum)
Patient transported to CT. Pt reports that when she was getting the pressure on the right side of her head that the right side of her face would feel numb. Pt reports waking up yesterday with heaviness feeling on both sides of her face. Dr. Radford Pax made aware

## 2012-10-19 ENCOUNTER — Other Ambulatory Visit: Payer: Self-pay | Admitting: Obstetrics and Gynecology

## 2012-10-19 ENCOUNTER — Ambulatory Visit
Admission: RE | Admit: 2012-10-19 | Discharge: 2012-10-19 | Disposition: A | Discharge status: Home / Self Care | Payer: BC Managed Care – PPO | Source: Ambulatory Visit

## 2012-10-19 DIAGNOSIS — N63 Unspecified lump in unspecified breast: Secondary | ICD-10-CM

## 2012-10-19 DIAGNOSIS — Z1231 Encounter for screening mammogram for malignant neoplasm of breast: Secondary | ICD-10-CM

## 2012-11-02 ENCOUNTER — Ambulatory Visit
Admission: RE | Admit: 2012-11-02 | Discharge: 2012-11-02 | Disposition: A | Discharge status: Home / Self Care | Payer: BC Managed Care – PPO | Source: Ambulatory Visit | Attending: Obstetrics and Gynecology | Admitting: Obstetrics and Gynecology

## 2012-11-02 DIAGNOSIS — N63 Unspecified lump in unspecified breast: Secondary | ICD-10-CM

## 2012-11-22 ENCOUNTER — Telehealth: Payer: Self-pay | Admitting: *Deleted

## 2012-11-22 NOTE — Telephone Encounter (Signed)
Message copied by Marella Bile on Mon Nov 22, 2012 10:16 AM ------      Message from: Theador Hawthorne      Created: Tue Nov 16, 2012 12:57 PM       She is our patient.  I am showing that we received an order on 4/2 to get a download asap and we have left her 3 messages since then to try and get one.  She has not called [place] back.            Thx      Betsy            ----- Message -----         From: Marella Bile, RN         Sent: 11/16/2012  12:34 PM           To: Theador Hawthorne            Faythe Dingwall! Can you check and see if you guys follow this lady.  If you do, has she had a recent download?  Thx!       ------

## 2012-11-22 NOTE — Telephone Encounter (Signed)
I called patient to find out what was the situation with her CPAP machine.  She needs to have a download because she was having problems at last office visit

## 2013-02-15 ENCOUNTER — Telehealth: Payer: Self-pay | Admitting: Gastroenterology

## 2013-02-15 NOTE — Telephone Encounter (Signed)
Patient with Ruq pain.  Would like to be seen asap.  She will come in and see Willette Cluster RNP tomorrow at 76

## 2013-02-16 ENCOUNTER — Encounter: Payer: Self-pay | Admitting: *Deleted

## 2013-02-16 ENCOUNTER — Encounter: Payer: Self-pay | Admitting: Nurse Practitioner

## 2013-02-16 ENCOUNTER — Other Ambulatory Visit (INDEPENDENT_AMBULATORY_CARE_PROVIDER_SITE_OTHER): Payer: BC Managed Care – PPO

## 2013-02-16 ENCOUNTER — Ambulatory Visit (INDEPENDENT_AMBULATORY_CARE_PROVIDER_SITE_OTHER): Payer: BC Managed Care – PPO | Admitting: Nurse Practitioner

## 2013-02-16 VITALS — BP 130/74 | HR 88 | Ht 64.0 in | Wt 209.7 lb

## 2013-02-16 DIAGNOSIS — R109 Unspecified abdominal pain: Secondary | ICD-10-CM

## 2013-02-16 DIAGNOSIS — R112 Nausea with vomiting, unspecified: Secondary | ICD-10-CM

## 2013-02-16 DIAGNOSIS — G8929 Other chronic pain: Secondary | ICD-10-CM

## 2013-02-16 DIAGNOSIS — R110 Nausea: Secondary | ICD-10-CM

## 2013-02-16 DIAGNOSIS — R1011 Right upper quadrant pain: Secondary | ICD-10-CM

## 2013-02-16 DIAGNOSIS — R1031 Right lower quadrant pain: Secondary | ICD-10-CM

## 2013-02-16 DIAGNOSIS — K5900 Constipation, unspecified: Secondary | ICD-10-CM

## 2013-02-16 LAB — CBC WITH DIFFERENTIAL/PLATELET
Basophils Relative: 0.4 % (ref 0.0–3.0)
Eosinophils Absolute: 0.1 10*3/uL (ref 0.0–0.7)
Eosinophils Relative: 1 % (ref 0.0–5.0)
HCT: 40.8 % (ref 36.0–46.0)
Lymphs Abs: 2.7 10*3/uL (ref 0.7–4.0)
MCHC: 33.7 g/dL (ref 30.0–36.0)
MCV: 81.7 fl (ref 78.0–100.0)
Monocytes Absolute: 0.6 10*3/uL (ref 0.1–1.0)
Neutro Abs: 9.2 10*3/uL — ABNORMAL HIGH (ref 1.4–7.7)
RBC: 4.99 Mil/uL (ref 3.87–5.11)
WBC: 12.7 10*3/uL — ABNORMAL HIGH (ref 4.5–10.5)

## 2013-02-16 LAB — COMPREHENSIVE METABOLIC PANEL
Albumin: 4.3 g/dL (ref 3.5–5.2)
Alkaline Phosphatase: 86 U/L (ref 39–117)
BUN: 10 mg/dL (ref 6–23)
Creatinine, Ser: 1 mg/dL (ref 0.4–1.2)
Glucose, Bld: 91 mg/dL (ref 70–99)
Total Bilirubin: 0.8 mg/dL (ref 0.3–1.2)

## 2013-02-16 MED ORDER — ONDANSETRON 4 MG PO TBDP: 4.0000 mg | ORAL_TABLET | Freq: Once | ORAL | Status: DC

## 2013-02-16 NOTE — Progress Notes (Signed)
HPI :  Patient is a 42 year old female known remotely to Dr. Russella Dar. In June 2009 she underwent upper endoscopy for evaluation of right upper quadrant pain and reflux symptoms. Findings included erosive gastritis. Following that, in August 2009 patient had a colonoscopy for a family history of colon polyps as well as for evaluation of abdominal pain, bloating, and bowel change. Colonoscopy was normal.  Patient here with husband for evaluation of abdominal pain and nausea. A few weeks ago she developed nausea and RLQ pain. Evaluation by GYN,including what sounds like a transvaginal ultrasound and urinalysis was normal.  Patient's menstrual cycle began and pain improved, mainly just noticeable when in sitting position. Unfortunately the nausea persisted and she developed RUQ pain several days ago. She went back to GYN this am. He apparently did an abdominal ultrasound and ruled out gallstones. Pain is almost constant but definitely worse with meals. Patient cannot recall if pain similar to what she was evaluated for in 2009.  Patient takes NSAIDS several days a week. She takes PPI only as needed. No black stools or rectal bleeding. She has been slightly constipated for a week or so. No urinary symptoms.  Patient does recall falling down several weeks ago. She did land on right side but had no significant pain following the fall. Her right sided abdominal pain didn't start for several days later.   Past Medical History  Diagnosis Date  . GERD (gastroesophageal reflux disease)   . Headache(784.0)   . Endometriosis   . Ovarian cyst, left   . External hemorrhoids   . Erosive gastritis   . Proctalgia fugax   . Anxiety     Family History  Problem Relation Age of Onset  . Breast cancer Maternal Grandmother   . Colon cancer Neg Hx   . Prostate cancer Paternal Grandfather   . Colon polyps Father    History  Substance Use Topics  . Smoking status: Never Smoker   . Smokeless tobacco: Not on file  .  Alcohol Use: Yes   Current Outpatient Prescriptions  Medication Sig Dispense Refill  . acetaminophen (TYLENOL) 500 MG tablet Take 1,000 mg by mouth 2 (two) times daily as needed for pain. For pain      . ibuprofen (ADVIL,MOTRIN) 200 MG tablet Take 400 mg by mouth every 6 (six) hours as needed for pain. For pain      . omeprazole (PRILOSEC) 20 MG capsule Take 1 capsule (20 mg total) by mouth daily.  30 capsule  0  . ondansetron (ZOFRAN-ODT) 4 MG disintegrating tablet Take 1 tablet (4 mg total) by mouth once.  30 tablet  1   No current facility-administered medications for this visit.   Allergies  Allergen Reactions  . Promethazine Hcl Other (See Comments)    Violent tremors   . Codeine Other (See Comments)    Was a child when she took this.  . Azithromycin Rash    Head to toe rash   Review of Systems: Positive for allergy and sinus trouble, fatigue, headaches, Mr. Suzie Portela, muscle pain, night sweats, nosebleed, shortness of breath, skin rash and urine leakage. Positive for swelling of feet and legs. All other systems reviewed and negative except where noted in HPI.    Physical Exam: BP 130/74  Pulse 88  Ht 5\' 4"  (1.626 m)  Wt 209 lb 11.2 oz (95.119 kg)  BMI 35.98 kg/m2 Constitutional: Pleasant,obese white female in no acute distress. HEENT: Normocephalic and atraumatic. Conjunctivae are normal. No scleral icterus. Neck  supple.  Cardiovascular: Normal rate, regular rhythm.  Pulmonary/chest: Effort normal and breath sounds normal. No wheezing, rales or rhonchi. Abdominal: Soft, nondistended, RUQ and RLQ tenderness. Bowel sounds active throughout. There are no masses palpable. No hepatomegaly. Extremities: no edema Lymphadenopathy: No cervical adenopathy noted. Neurological: Alert and oriented to person place and time. Skin: Skin is warm and dry. No rashes noted. Psychiatric: Normal mood and affect. Behavior is normal.   ASSESSMENT AND PLAN:  67. 42 year old female with a  several week history of right sided abdominal pain and nausea. Etiology not clear as pain initially started in RLQ, now predominantly RUQ and worse with meals. Assuming upper and lower abdominal pains are unrelated then PUD should be excluded given her nausea and NSAID use.  Advised discontinuation of NSAIDS, begin BID Prilosec. Will obtain basic labs. I have requested the ultrasound report from GYN's office. For further evaluation patient will be scheduled for EGD. The benefits, risks, and potential complications of EGD with possible biopsies were discussed with the patient and she agrees to proceed. If EGD negative then perhaps CT scan would be helpful. Of note, musculoskeletal pain not excluded given fall several weeks ago.   2. Constipation. Recommended daily MIralax. Abdominal pain preceded constipation but need to make sure constipation not exacerbating her symptoms.

## 2013-02-16 NOTE — Patient Instructions (Addendum)
Please go to the basement level to have your labs drawn.  We sent a refill to [facility] [address] for the Zofran ( Ondansetron ).   Take Miralax daily, 17 grams in 8 oz of water of juice. Hold the ibuprofen for now.  You have been scheduled for an endoscopy with propofol. Please follow written instructions given to you at your visit today. If you use inhalers (even only as needed), please bring them with you on the day of your procedure. Your physician has requested that you go to www.startemmi.com and enter the access code given to you at your visit today. This web site gives a general overview about your procedure. However, you should still follow specific instructions given to you by our office regarding your preparation for the procedure.

## 2013-02-17 ENCOUNTER — Telehealth: Payer: Self-pay | Admitting: Nurse Practitioner

## 2013-02-17 DIAGNOSIS — R1011 Right upper quadrant pain: Secondary | ICD-10-CM

## 2013-02-17 DIAGNOSIS — R110 Nausea: Secondary | ICD-10-CM | POA: Insufficient documentation

## 2013-02-17 NOTE — Telephone Encounter (Signed)
Patient wants to know if her WBC's being elevated could mean she had appendicitis. Please, advise.

## 2013-02-17 NOTE — Telephone Encounter (Signed)
Per Willette Cluster, NP,would not have a high suspicion due to 3 week long episode, lower abdominal pain got better after she had a period and the pain being upper abdominal that is worse with eating. Spoke with patient and gave her this information. She states she is in bed and still has some lower abdominal pain. States she is not eating much either. States she is not worse but not any better either.

## 2013-02-17 NOTE — Telephone Encounter (Signed)
Per Willette Cluster, NP,get CT abdomen, pelvis. Patient aware that we will be scheduling this.

## 2013-02-17 NOTE — Progress Notes (Signed)
Reviewed and agree with management plan.  Blakeleigh Domek T. Ashiya Kinkead, MD FACG 

## 2013-02-18 ENCOUNTER — Telehealth: Payer: Self-pay | Admitting: Nurse Practitioner

## 2013-02-18 ENCOUNTER — Ambulatory Visit (INDEPENDENT_AMBULATORY_CARE_PROVIDER_SITE_OTHER)
Admission: RE | Admit: 2013-02-18 | Discharge: 2013-02-18 | Disposition: A | Discharge status: Home / Self Care | Payer: BC Managed Care – PPO | Source: Ambulatory Visit | Attending: Nurse Practitioner | Admitting: Nurse Practitioner

## 2013-02-18 DIAGNOSIS — R1011 Right upper quadrant pain: Secondary | ICD-10-CM

## 2013-02-18 MED ORDER — IOHEXOL 300 MG/ML  SOLN
100.0000 mL | Freq: Once | INTRAMUSCULAR | Status: AC | PRN
  Administered 2013-02-18: 100 mL via INTRAVENOUS

## 2013-02-18 NOTE — Telephone Encounter (Signed)
Scheduled CT of abd/pelvis today at 2:30 PM. Contrast 2 and 1 hour prior. NPO 4 hours prior. Patient aware. LMP 02/08/13. Rose notified. Contrast up front for pick up.

## 2013-02-18 NOTE — Telephone Encounter (Signed)
Per Willette Cluster, NP, negative CT scan. Keep procedure as scheduled. Patient notified.

## 2013-02-21 ENCOUNTER — Ambulatory Visit (AMBULATORY_SURGERY_CENTER): Payer: BC Managed Care – PPO | Admitting: Gastroenterology

## 2013-02-21 ENCOUNTER — Encounter: Payer: Self-pay | Admitting: Gastroenterology

## 2013-02-21 VITALS — BP 114/62 | HR 77 | Temp 97.5°F | Resp 21 | Ht 64.0 in | Wt 209.0 lb

## 2013-02-21 DIAGNOSIS — R109 Unspecified abdominal pain: Secondary | ICD-10-CM

## 2013-02-21 DIAGNOSIS — R1011 Right upper quadrant pain: Secondary | ICD-10-CM

## 2013-02-21 DIAGNOSIS — R112 Nausea with vomiting, unspecified: Secondary | ICD-10-CM

## 2013-02-21 DIAGNOSIS — R110 Nausea: Secondary | ICD-10-CM

## 2013-02-21 DIAGNOSIS — R1031 Right lower quadrant pain: Secondary | ICD-10-CM

## 2013-02-21 MED ORDER — GLYCOPYRROLATE 1 MG PO TABS: 1.0000 mg | ORAL_TABLET | Freq: Two times a day (BID) | ORAL | Status: DC

## 2013-02-21 MED ORDER — SODIUM CHLORIDE 0.9 % IV SOLN: 500.0000 mL | INTRAVENOUS | Status: DC

## 2013-02-21 NOTE — Progress Notes (Signed)
Jennye Boroughs, RN went over [employer] with pt and her husband.  No complaints noted in the recovery room. Maw

## 2013-02-21 NOTE — Op Note (Signed)
[facility] [address] [place], [address]   ENDOSCOPY PROCEDURE REPORT  PATIENT: Lorraine Conrad, Lorraine Conrad  MR#: [id number] BIRTHDATE: September 16, 1970 , 42  yrs. old GENDER: Female ENDOSCOPIST: Meryl Dare, MD, [facility] PROCEDURE DATE:  02/21/2013 PROCEDURE:  EGD, diagnostic ASA CLASS:     Class II INDICATIONS:  abdominal pain in the upper right quadrant. abdominal pain in the lower right quadrant.   Nausea. MEDICATIONS: MAC sedation, administered by CRNA and propofol (Diprivan) 200mg  IV TOPICAL ANESTHETIC: none DESCRIPTION OF PROCEDURE: After the risks benefits and alternatives of the procedure were thoroughly explained, informed consent was obtained.  The LB [id number] endoscope was introduced through the mouth and advanced to the second portion of the duodenum  without limitations.  The instrument was slowly withdrawn as the mucosa was fully examined.  DUODENUM: Mild duodenal inflammation was found in the duodenal bulb. The duodenal mucosa showed no abnormalities in the 2nd part of the duodenum. STOMACH: The mucosa and folds of the stomach appeared normal. ESOPHAGUS: The mucosa of the esophagus appeared normal. Retroflexed views revealed no abnormalities. The scope was then withdrawn from the patient and the procedure completed.  COMPLICATIONS: There were no complications.  ENDOSCOPIC IMPRESSION: 1.   Mild duodenitis in the bulb  RECOMMENDATIONS: 1.  Anti-reflux regimen 2.  Continue PPI 3.  Robinul 1 mg po bid, 1 year of refills   eSigned:  Meryl Dare, MD, [facility] 02/21/2013 10:20 AM

## 2013-02-21 NOTE — Progress Notes (Signed)
  [facility] Anesthesia Post-op Note  Patient: Lorraine Conrad  Procedure(s) Performed: endoscopy  Patient Location: [facility] - Recovery Area  Anesthesia Type: Deep Sedation/Propofol  Level of Consciousness: awake, oriented and patient cooperative  Airway and Oxygen Therapy: Patient Spontanous Breathing  Post-op Pain: none  Post-op Assessment:  Post-op Vital signs reviewed, Patient's Cardiovascular Status Stable, Respiratory Function Stable, Patent Airway, No signs of Nausea or vomiting and Pain level controlled  Post-op Vital Signs: Reviewed and stable  Complications: No apparent anesthesia complications  Zalma Channing E 10:18 AM

## 2013-02-21 NOTE — Patient Instructions (Addendum)

## 2013-02-22 ENCOUNTER — Telehealth: Payer: Self-pay

## 2013-02-22 NOTE — Telephone Encounter (Signed)
  Follow up Call-  Call back number 02/21/2013  Post procedure Call Back phone  # [phone]  Permission to leave phone message Yes     Patient questions:  Do you have a fever, pain , or abdominal swelling? no Pain Score  0 *  Have you tolerated food without any problems? yes  Have you been able to return to your normal activities? yes  Do you have any questions about your discharge instructions: Diet   no Medications  no Follow up visit  no  Do you have questions or concerns about your Care? no  Actions: * If pain score is 4 or above: No action needed, pain <4.  The pt had questions about the robinul prescribed ans also previced.  I answered to the best of my knowledge.  I recommeded she call the pharmacist for futher knowledge about these meds.  She said she would.maw

## 2013-03-15 ENCOUNTER — Other Ambulatory Visit: Payer: Self-pay | Admitting: Obstetrics and Gynecology

## 2013-03-15 DIAGNOSIS — N6001 Solitary cyst of right breast: Secondary | ICD-10-CM

## 2013-03-18 ENCOUNTER — Encounter: Payer: Self-pay | Admitting: Family Medicine

## 2013-03-18 ENCOUNTER — Ambulatory Visit (INDEPENDENT_AMBULATORY_CARE_PROVIDER_SITE_OTHER): Payer: BC Managed Care – PPO | Admitting: Family Medicine

## 2013-03-18 ENCOUNTER — Other Ambulatory Visit: Payer: Self-pay | Admitting: Family Medicine

## 2013-03-18 VITALS — BP 130/80 | HR 85 | Temp 98.1°F | Resp 16 | Ht 65.0 in | Wt 216.1 lb

## 2013-03-18 DIAGNOSIS — K589 Irritable bowel syndrome without diarrhea: Secondary | ICD-10-CM | POA: Insufficient documentation

## 2013-03-18 DIAGNOSIS — R1031 Right lower quadrant pain: Secondary | ICD-10-CM

## 2013-03-18 DIAGNOSIS — F411 Generalized anxiety disorder: Secondary | ICD-10-CM

## 2013-03-18 DIAGNOSIS — M722 Plantar fascial fibromatosis: Secondary | ICD-10-CM

## 2013-03-18 LAB — CBC WITH DIFFERENTIAL/PLATELET
Basophils Absolute: 0.1 10*3/uL (ref 0.0–0.1)
Eosinophils Absolute: 0.1 10*3/uL (ref 0.0–0.7)
Lymphocytes Relative: 17.2 % (ref 12.0–46.0)
MCHC: 33.4 g/dL (ref 30.0–36.0)
Monocytes Relative: 4.8 % (ref 3.0–12.0)
Neutrophils Relative %: 76.1 % (ref 43.0–77.0)
Platelets: 469 10*3/uL — ABNORMAL HIGH (ref 150.0–400.0)
RDW: 15.1 % — ABNORMAL HIGH (ref 11.5–14.6)

## 2013-03-18 MED ORDER — DICYCLOMINE HCL 20 MG PO TABS: 20.0000 mg | ORAL_TABLET | Freq: Four times a day (QID) | ORAL | Status: DC

## 2013-03-18 NOTE — Assessment & Plan Note (Signed)
New.  Pt reports alternating bouts of diarrhea and constipation along w/ abdominal cramping.  Start Bentyl.  Reviewed lifestyle modifications.  Will follow.

## 2013-03-18 NOTE — Assessment & Plan Note (Signed)
New.  Following w/ foot center in [place].  They supposedly referred pt to me.  Currently in foot/ankle brace and has separate brace for night.  Also receiving injxns.  Will follow along.

## 2013-03-18 NOTE — Progress Notes (Signed)
  Subjective:    Patient ID: Lorraine Conrad, female    DOB: 1971-04-11, 42 y.o.   MRN: [id number]  HPI New to establish.  Previous MD- [facility]  GI- Russella Dar  GYN- Dr Allena Katz ([facility])  Pt preparing for hysterectomy.  1 month ago fell, developed back pain on R side.  Within a few days 'i couldn't stand up, i couldn't eat anything.  i had extreme pain'.  Saw GYN- had R ovarian inflammation and adhesions to uterus.  Saw Dr Russella Dar.  Continues to have 'extreme pain' on R side- occurred this AM when lying down.  6 yrs ago had episode of severe back pain.  Was 'dragging my legs'.  Saw neuro, saw MS specialist.  Had multiple MRIs- no clear results w/ exception of bulging discs at L4-5, 'something at the braline and upper neck'.  'i've never gotten back to where i was'.  Was referred here by 'Dr Maisie Fus something at the [facility] in [place]'.  Also referred to Neuro (appt next week).  Has seen cards ([facility]) for SOB work up- this was normal.  Current sxs are R abd pain.  Has seen GI, has hysterectomy pending.  Pain is associated w/ nausea, intermittent diarrhea/constipation.  Had CT scan last month that was normal.  Currently on Prilosec.  Pain will worsen w/ fatty foods.   Review of Systems For ROS see HPI     Objective:   Physical Exam  Vitals reviewed. Constitutional: She is oriented to person, place, and time. She appears well-developed and well-nourished. No distress.  HENT:  Head: Normocephalic and atraumatic.  Neck: Normal range of motion. Neck supple. No thyromegaly present.  Cardiovascular: Normal rate, regular rhythm and normal heart sounds.   Pulmonary/Chest: Effort normal and breath sounds normal. No respiratory distress. She has no wheezes. She has no rales.  Abdominal: Soft. Bowel sounds are normal. She exhibits no distension. There is no tenderness. There is no rebound and no guarding.  Musculoskeletal: She exhibits no edema.  L foot/ankle brace in place  Neurological: She is alert and  oriented to person, place, and time.  Skin: Skin is warm and dry.  Psychiatric:  Flat affect          Assessment & Plan:

## 2013-03-18 NOTE — Patient Instructions (Signed)
We'll notify you of your lab results and notify you of your next steps Increase your water and fiber intake Add the bentyl- this is as needed for abdominal/colon spasm Please have your doctors send me copies of their notes so I can follow along Call with any questions or concerns Hang in there!

## 2013-03-18 NOTE — Assessment & Plan Note (Signed)
New to provider.  Ongoing for pt.  Has seen GI, GYN.  Has upcoming hysterectomy.  Pain is more R central than either upper or lower.  Check labs to r/o pancreatitis, liver dysfunction, infxn.  Pt may need HIDA scan to assess gallbladder function in setting of normal CT scan.  Will follow.

## 2013-03-18 NOTE — Assessment & Plan Note (Signed)
This seems to play a large role in pt's list of medical concerns- particularly if she has seen multiple specialists as described, had multiple imaging studies and tests, and she has yet to get a diagnosis.  Pt denies sxs but has a flat affect and appears to be withdrawn.  Will follow closely.

## 2013-03-21 LAB — BASIC METABOLIC PANEL
BUN: 9 mg/dL (ref 6–23)
Calcium: 9.2 mg/dL (ref 8.4–10.5)
Chloride: 102 mEq/L (ref 96–112)
Creat: 0.95 mg/dL (ref 0.50–1.10)
Glucose, Bld: 90 mg/dL (ref 70–99)

## 2013-03-21 LAB — HEPATIC FUNCTION PANEL
ALT: 8 U/L (ref 0–35)
AST: 10 U/L (ref 0–37)
Albumin: 3.9 g/dL (ref 3.5–5.2)
Bilirubin, Direct: 0.1 mg/dL (ref 0.0–0.3)
Total Protein: 6.9 g/dL (ref 6.0–8.3)

## 2013-03-21 LAB — LIPASE: Lipase: 17 U/L (ref 0–75)

## 2013-03-21 LAB — ANA: Anti Nuclear Antibody(ANA): NEGATIVE

## 2013-03-21 LAB — TSH: TSH: 1.949 u[IU]/mL (ref 0.350–4.500)

## 2013-03-22 ENCOUNTER — Other Ambulatory Visit: Payer: Self-pay | Admitting: Family Medicine

## 2013-03-22 DIAGNOSIS — D72829 Elevated white blood cell count, unspecified: Secondary | ICD-10-CM

## 2013-03-28 ENCOUNTER — Encounter: Payer: Self-pay | Admitting: General Practice

## 2013-03-28 ENCOUNTER — Other Ambulatory Visit (INDEPENDENT_AMBULATORY_CARE_PROVIDER_SITE_OTHER): Payer: BC Managed Care – PPO

## 2013-03-28 DIAGNOSIS — D72829 Elevated white blood cell count, unspecified: Secondary | ICD-10-CM

## 2013-03-28 LAB — CBC WITH DIFFERENTIAL/PLATELET
Basophils Absolute: 0 10*3/uL (ref 0.0–0.1)
Hemoglobin: 11.8 g/dL — ABNORMAL LOW (ref 12.0–15.0)
Lymphocytes Relative: 23.1 % (ref 12.0–46.0)
MCHC: 33.4 g/dL (ref 30.0–36.0)
Monocytes Relative: 5.7 % (ref 3.0–12.0)
Platelets: 420 10*3/uL — ABNORMAL HIGH (ref 150.0–400.0)
RDW: 15.2 % — ABNORMAL HIGH (ref 11.5–14.6)
WBC: 9.1 10*3/uL (ref 4.5–10.5)

## 2013-03-30 ENCOUNTER — Telehealth: Payer: Self-pay | Admitting: *Deleted

## 2013-03-30 NOTE — Telephone Encounter (Signed)
Called and LMOVM for pt to return call.  

## 2013-03-30 NOTE — Telephone Encounter (Signed)
Patient called regarding lab results. Results were given. Patient wanted to know if she needed to come back to get her CBC rechecked in a few weeks to ensure that her WBC is staying within the normal range. Please advise.

## 2013-03-30 NOTE — Telephone Encounter (Signed)
No need to repeat CBC.  We will do this regularly at physicals.

## 2013-03-31 NOTE — Telephone Encounter (Signed)
Called pt on both home and cell phones. Could not leave a message. Left a call back number. Closing encounter until pt return call.

## 2013-04-04 ENCOUNTER — Other Ambulatory Visit: Payer: BC Managed Care – PPO

## 2013-04-09 HISTORY — PX: ABDOMINAL HYSTERECTOMY: SHX81

## 2013-04-11 ENCOUNTER — Ambulatory Visit
Admission: RE | Admit: 2013-04-11 | Discharge: 2013-04-11 | Disposition: A | Discharge status: Home / Self Care | Payer: BC Managed Care – PPO | Source: Ambulatory Visit | Attending: Obstetrics and Gynecology | Admitting: Obstetrics and Gynecology

## 2013-04-11 ENCOUNTER — Other Ambulatory Visit: Payer: Self-pay | Admitting: Obstetrics and Gynecology

## 2013-04-11 DIAGNOSIS — N6001 Solitary cyst of right breast: Secondary | ICD-10-CM

## 2013-06-09 HISTORY — PX: OOPHORECTOMY: SHX86

## 2013-09-21 ENCOUNTER — Other Ambulatory Visit: Payer: Self-pay | Admitting: Internal Medicine

## 2013-09-21 ENCOUNTER — Other Ambulatory Visit: Payer: Self-pay | Admitting: *Deleted

## 2013-09-21 DIAGNOSIS — D249 Benign neoplasm of unspecified breast: Secondary | ICD-10-CM

## 2013-09-21 DIAGNOSIS — N6001 Solitary cyst of right breast: Secondary | ICD-10-CM

## 2013-09-22 ENCOUNTER — Other Ambulatory Visit: Payer: Self-pay | Admitting: *Deleted

## 2013-09-22 DIAGNOSIS — N6001 Solitary cyst of right breast: Secondary | ICD-10-CM

## 2013-09-26 ENCOUNTER — Other Ambulatory Visit: Payer: Self-pay | Admitting: Obstetrics and Gynecology

## 2013-09-26 DIAGNOSIS — N6001 Solitary cyst of right breast: Secondary | ICD-10-CM

## 2013-09-29 ENCOUNTER — Ambulatory Visit: Payer: BC Managed Care – PPO | Admitting: Family Medicine

## 2013-10-11 ENCOUNTER — Ambulatory Visit (INDEPENDENT_AMBULATORY_CARE_PROVIDER_SITE_OTHER): Payer: BC Managed Care – PPO | Admitting: Family Medicine

## 2013-10-11 ENCOUNTER — Encounter: Payer: Self-pay | Admitting: Family Medicine

## 2013-10-11 VITALS — BP 128/80 | HR 80 | Temp 98.4°F | Resp 16 | Wt 213.0 lb

## 2013-10-11 DIAGNOSIS — R300 Dysuria: Secondary | ICD-10-CM

## 2013-10-11 DIAGNOSIS — H04209 Unspecified epiphora, unspecified lacrimal gland: Secondary | ICD-10-CM

## 2013-10-11 DIAGNOSIS — H04203 Unspecified epiphora, bilateral lacrimal glands: Secondary | ICD-10-CM

## 2013-10-11 DIAGNOSIS — R7989 Other specified abnormal findings of blood chemistry: Secondary | ICD-10-CM

## 2013-10-11 DIAGNOSIS — R319 Hematuria, unspecified: Secondary | ICD-10-CM

## 2013-10-11 LAB — POCT URINALYSIS DIPSTICK
Bilirubin, UA: NEGATIVE
Glucose, UA: NEGATIVE
Ketones, UA: NEGATIVE
Leukocytes, UA: NEGATIVE
NITRITE UA: NEGATIVE
SPEC GRAV UA: 1.01
Urobilinogen, UA: 0.2
pH, UA: 6.5

## 2013-10-11 LAB — CBC WITH DIFFERENTIAL/PLATELET
Basophils Absolute: 0 10*3/uL (ref 0.0–0.1)
Basophils Relative: 0.3 % (ref 0.0–3.0)
EOS ABS: 0.1 10*3/uL (ref 0.0–0.7)
Eosinophils Relative: 1.4 % (ref 0.0–5.0)
HCT: 38 % (ref 36.0–46.0)
Hemoglobin: 12.5 g/dL (ref 12.0–15.0)
Lymphocytes Relative: 20.3 % (ref 12.0–46.0)
Lymphs Abs: 1.9 10*3/uL (ref 0.7–4.0)
MCHC: 33 g/dL (ref 30.0–36.0)
MCV: 84.4 fl (ref 78.0–100.0)
MONO ABS: 0.6 10*3/uL (ref 0.1–1.0)
Monocytes Relative: 6.6 % (ref 3.0–12.0)
NEUTROS PCT: 71.4 % (ref 43.0–77.0)
Neutro Abs: 6.7 10*3/uL (ref 1.4–7.7)
PLATELETS: 389 10*3/uL (ref 150.0–400.0)
RBC: 4.51 Mil/uL (ref 3.87–5.11)
RDW: 14.8 % (ref 11.5–15.5)
WBC: 9.4 10*3/uL (ref 4.0–10.5)

## 2013-10-11 MED ORDER — CEPHALEXIN 500 MG PO CAPS: 500.0000 mg | ORAL_CAPSULE | Freq: Two times a day (BID) | ORAL | Status: AC

## 2013-10-11 NOTE — Patient Instructions (Signed)
Schedule your complete physical in 3 months We'll notify you of your lab results and make any changes if needed Start OTC Claritin or Zyrtec for the allergy component of your watery eyes Start the Keflex for probably UTI- follow up w/ urology as scheduled Call with any questions or concerns [place]!

## 2013-10-11 NOTE — Progress Notes (Signed)
   Subjective:    Patient ID: Lorraine Conrad, female    DOB: 19-Nov-1970, 43 y.o.   MRN: [id number]  HPI Dysuria- ongoing issue since hysterectomy Nov 2014.  Pt reports burning w/ each urination.  Has appt w/ [facility] upcoming.  Had multiple UTIs after surgery.  Pt doesn't feel like she has current UTI.  Watery eyes- L sided predominant.  Hx of seasonal allergies.  Difficulty breathing through nose.  Eyes do not itch.  Do not feel gritty.   Review of Systems For ROS see HPI     Objective:   Physical Exam  Vitals reviewed. Constitutional: She appears well-developed and well-nourished. No distress.  HENT:  Head: Normocephalic and atraumatic.  Right Ear: Tympanic membrane normal.  Left Ear: Tympanic membrane normal.  Nose: Mucosal edema and rhinorrhea present. Right sinus exhibits no maxillary sinus tenderness and no frontal sinus tenderness. Left sinus exhibits no maxillary sinus tenderness and no frontal sinus tenderness.  Mouth/Throat: Mucous membranes are normal. Posterior oropharyngeal erythema (w/ PND) present.  Eyes: Conjunctivae and EOM are normal. Pupils are equal, round, and reactive to light.  Neck: Normal range of motion. Neck supple.  Cardiovascular: Normal rate, regular rhythm and normal heart sounds.   Pulmonary/Chest: Effort normal and breath sounds normal. No respiratory distress. She has no wheezes. She has no rales.  Abdominal: She exhibits no distension. There is no tenderness. There is no rebound.  Lymphadenopathy:    She has no cervical adenopathy.          Assessment & Plan:

## 2013-10-11 NOTE — Assessment & Plan Note (Signed)
New.  Suspect this is due to pt's untreated seasonal allergies.  Start OTC antihistamine.  Monitor for symptom improvement.

## 2013-10-11 NOTE — Progress Notes (Signed)
Pre visit review using our clinic review tool, if applicable. No additional management support is needed unless otherwise documented below in the visit note. 

## 2013-10-11 NOTE — Assessment & Plan Note (Signed)
Noted on previous labs.  Pt interested in repeating today to assess.  Will follow.

## 2013-10-11 NOTE — Assessment & Plan Note (Signed)
New.  Pt's UA suspicious for infxn.  Start abx.  Pt to f/u w/ urology as already scheduled.

## 2013-10-12 ENCOUNTER — Other Ambulatory Visit: Payer: BC Managed Care – PPO

## 2013-10-12 ENCOUNTER — Encounter: Payer: Self-pay | Admitting: General Practice

## 2013-10-13 LAB — URINE CULTURE: Colony Count: 5000

## 2013-10-14 ENCOUNTER — Encounter: Payer: Self-pay | Admitting: General Practice

## 2013-11-04 ENCOUNTER — Ambulatory Visit
Admission: RE | Admit: 2013-11-04 | Discharge: 2013-11-04 | Disposition: A | Discharge status: Home / Self Care | Payer: BC Managed Care – PPO | Source: Ambulatory Visit | Attending: *Deleted | Admitting: *Deleted

## 2013-11-04 DIAGNOSIS — N6001 Solitary cyst of right breast: Secondary | ICD-10-CM

## 2013-11-14 DIAGNOSIS — N3010 Interstitial cystitis (chronic) without hematuria: Secondary | ICD-10-CM | POA: Insufficient documentation

## 2013-11-22 ENCOUNTER — Encounter: Payer: Self-pay | Admitting: Family Medicine

## 2013-12-26 ENCOUNTER — Ambulatory Visit (HOSPITAL_BASED_OUTPATIENT_CLINIC_OR_DEPARTMENT_OTHER)
Admission: RE | Admit: 2013-12-26 | Discharge: 2013-12-26 | Disposition: A | Discharge status: Home / Self Care | Payer: BC Managed Care – PPO | Source: Ambulatory Visit | Attending: Physician Assistant | Admitting: Physician Assistant

## 2013-12-26 ENCOUNTER — Ambulatory Visit (INDEPENDENT_AMBULATORY_CARE_PROVIDER_SITE_OTHER): Payer: BC Managed Care – PPO | Admitting: Physician Assistant

## 2013-12-26 ENCOUNTER — Encounter: Payer: Self-pay | Admitting: Physician Assistant

## 2013-12-26 VITALS — BP 117/79 | HR 85 | Temp 98.6°F | Resp 16 | Ht 65.0 in | Wt 208.4 lb

## 2013-12-26 DIAGNOSIS — M542 Cervicalgia: Secondary | ICD-10-CM

## 2013-12-26 DIAGNOSIS — M62838 Other muscle spasm: Secondary | ICD-10-CM

## 2013-12-26 MED ORDER — CYCLOBENZAPRINE HCL 10 MG PO TABS: 10.0000 mg | ORAL_TABLET | Freq: Three times a day (TID) | ORAL | Status: DC | PRN

## 2013-12-26 NOTE — Patient Instructions (Signed)
Please rest.  Move around as tolerated.  Use Flexeril as directed for muscle spasm.  Aleve daily with food. Apply topical Aspercreme or Icy Hot to neck. Please go to [facility] for x-ray.  I will call you with your results.
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
## 2013-12-26 NOTE — Progress Notes (Signed)
Pre visit review using our clinic review tool, if applicable. No additional management support is needed unless otherwise documented below in the visit note/SLS  

## 2013-12-27 ENCOUNTER — Telehealth: Payer: Self-pay

## 2013-12-27 NOTE — Telephone Encounter (Signed)
Pt was contacted and VM was left asking Pt to contact office to recieve results from X-Ray

## 2014-01-01 DIAGNOSIS — M542 Cervicalgia: Secondary | ICD-10-CM | POA: Insufficient documentation

## 2014-01-01 NOTE — Assessment & Plan Note (Addendum)
Also with LUE and LLE pain with ROM and palpation.  MSK in nature with some noted spasm.  Will obtain x-ray of cervical spine. Rx medications given.  Stay well-hydrated.  Topical Aspercreme.  Avoid heavy lifting or overexertion.  Activity as tolerated.  Return precautions discussed with patient.

## 2014-01-16 ENCOUNTER — Ambulatory Visit (INDEPENDENT_AMBULATORY_CARE_PROVIDER_SITE_OTHER): Payer: BC Managed Care – PPO | Admitting: Family Medicine

## 2014-01-16 ENCOUNTER — Encounter: Payer: Self-pay | Admitting: Family Medicine

## 2014-01-16 VITALS — BP 118/78 | HR 93 | Temp 98.0°F | Ht 64.75 in | Wt 212.6 lb

## 2014-01-16 DIAGNOSIS — Z23 Encounter for immunization: Secondary | ICD-10-CM

## 2014-01-16 DIAGNOSIS — L408 Other psoriasis: Secondary | ICD-10-CM

## 2014-01-16 DIAGNOSIS — Z0000 Encounter for general adult medical examination without abnormal findings: Secondary | ICD-10-CM | POA: Insufficient documentation

## 2014-01-16 DIAGNOSIS — L409 Psoriasis, unspecified: Secondary | ICD-10-CM

## 2014-01-16 DIAGNOSIS — E669 Obesity, unspecified: Secondary | ICD-10-CM | POA: Insufficient documentation

## 2014-01-16 NOTE — Patient Instructions (Signed)
Follow up in 1 year or as needed We'll notify you of your lab results and make any changes if needed We'll call you with your dermatology appt Try and make healthy food choices and get regular exercise Call with any questions or concerns Enjoy the rest of your summer!!!

## 2014-01-16 NOTE — Assessment & Plan Note (Signed)
New.  No strong clinical evidence today but pt was told by podiatry that she had 'borderline blood work' and he suspected her rash was psoriasis.  Will refer to derm for complete evaluation.

## 2014-01-16 NOTE — Assessment & Plan Note (Signed)
Pt's PE WNL w/ exception of obesity.  UTD on GYN.  Check labs.  Anticipatory guidance provided. Tdap updated.

## 2014-01-16 NOTE — Addendum Note (Signed)
Addended by: Harl [name] on: 01/16/2014 10:15 AM   Modules accepted: Orders

## 2014-01-16 NOTE — Progress Notes (Signed)
Pre visit review using our clinic review tool, if applicable. No additional management support is needed unless otherwise documented below in the visit note. 

## 2014-01-16 NOTE — Progress Notes (Signed)
   Subjective:    Patient ID: Lorraine Conrad, female    DOB: Apr 02, 1971, 43 y.o.   MRN: [id number]  HPI CPE- UTD on GYN w/ Dr Posey Pronto ([facility]).  No concerns   Review of Systems Patient reports no vision/ hearing changes, adenopathy,fever, weight change,  persistant/recurrent hoarseness , swallowing issues, chest pain, palpitations, edema, persistant/recurrent cough, hemoptysis, dyspnea (rest/exertional/paroxysmal nocturnal), gastrointestinal bleeding (melena, rectal bleeding), abdominal pain, significant heartburn, bowel changes, GU symptoms (dysuria, hematuria, incontinence), Gyn symptoms (abnormal  bleeding, pain),  syncope, focal weakness, memory loss, numbness & tingling, hair/nail changes, abnormal bruising or bleeding, anxiety, or depression.  Skin changes- pt was told by Podiatrist that she has psoriasis.  Improves w/ topical steroid creams.     Objective:   Physical Exam General Appearance:    Alert, cooperative, no distress, appears stated age  Head:    Normocephalic, without obvious abnormality, atraumatic  Eyes:    PERRL, conjunctiva/corneas clear, EOM's intact, fundi    benign, both eyes  Ears:    Normal TM's and external ear canals, both ears  Nose:   Nares normal, septum midline, mucosa normal, no drainage    or sinus tenderness  Throat:   Lips, mucosa, and tongue normal; teeth and gums normal  Neck:   Supple, symmetrical, trachea midline, no adenopathy;    Thyroid: no enlargement/tenderness/nodules  Back:     Symmetric, no curvature, ROM normal, no CVA tenderness  Lungs:     Clear to auscultation bilaterally, respirations unlabored  Chest Wall:    No tenderness or deformity   Heart:    Regular rate and rhythm, S1 and S2 normal, no murmur, rub   or gallop  Breast Exam:    Deferred to GYN  Abdomen:     Soft, non-tender, bowel sounds active all four quadrants,    no masses, no organomegaly  Genitalia:    Deferred to GYN  Rectal:    Extremities:   Extremities normal,  atraumatic, no cyanosis or edema  Pulses:   2+ and symmetric all extremities  Skin:   Skin color, texture, turgor normal, faint rash on feet bilaterally  Lymph nodes:   Cervical, supraclavicular, and axillary nodes normal  Neurologic:   CNII-XII intact, normal strength, sensation and reflexes    throughout          Assessment & Plan:

## 2014-01-17 ENCOUNTER — Encounter: Payer: Self-pay | Admitting: General Practice

## 2014-01-17 LAB — LIPID PANEL
Cholesterol: 223 mg/dL — ABNORMAL HIGH (ref 0–200)
HDL: 37.5 mg/dL — ABNORMAL LOW (ref >39.00)
LDL Cholesterol: 150 mg/dL — ABNORMAL HIGH (ref 0–99)
NonHDL: 185.5
Total CHOL/HDL Ratio: 6
Triglycerides: 178 mg/dL — ABNORMAL HIGH (ref 0.0–149.0)
VLDL: 35.6 mg/dL (ref 0.0–40.0)

## 2014-01-17 LAB — BASIC METABOLIC PANEL
BUN: 7 mg/dL (ref 6–23)
CHLORIDE: 104 meq/L (ref 96–112)
CO2: 24 meq/L (ref 19–32)
Calcium: 8.9 mg/dL (ref 8.4–10.5)
Creatinine, Ser: 0.9 mg/dL (ref 0.4–1.2)
GFR: 77.1 mL/min (ref >60.00)
Glucose, Bld: 64 mg/dL — ABNORMAL LOW (ref 70–99)
Potassium: 4.3 mEq/L (ref 3.5–5.1)
SODIUM: 137 meq/L (ref 135–145)

## 2014-01-17 LAB — CBC WITH DIFFERENTIAL/PLATELET
BASOS PCT: 0.4 % (ref 0.0–3.0)
Basophils Absolute: 0 10*3/uL (ref 0.0–0.1)
Eosinophils Absolute: 0.2 10*3/uL (ref 0.0–0.7)
Eosinophils Relative: 1.9 % (ref 0.0–5.0)
HCT: 40.7 % (ref 36.0–46.0)
Hemoglobin: 13.4 g/dL (ref 12.0–15.0)
Lymphocytes Relative: 22.7 % (ref 12.0–46.0)
Lymphs Abs: 2.1 10*3/uL (ref 0.7–4.0)
MCHC: 33.1 g/dL (ref 30.0–36.0)
MCV: 86.3 fl (ref 78.0–100.0)
MONO ABS: 0.4 10*3/uL (ref 0.1–1.0)
Monocytes Relative: 4.8 % (ref 3.0–12.0)
NEUTROS PCT: 70.2 % (ref 43.0–77.0)
Neutro Abs: 6.5 10*3/uL (ref 1.4–7.7)
PLATELETS: 406 10*3/uL — AB (ref 150.0–400.0)
RBC: 4.71 Mil/uL (ref 3.87–5.11)
RDW: 15.3 % (ref 11.5–15.5)
WBC: 9.2 10*3/uL (ref 4.0–10.5)

## 2014-01-17 LAB — HEPATIC FUNCTION PANEL
ALT: 12 U/L (ref 0–35)
AST: 17 U/L (ref 0–37)
Albumin: 3.6 g/dL (ref 3.5–5.2)
Alkaline Phosphatase: 75 U/L (ref 39–117)
BILIRUBIN DIRECT: 0 mg/dL (ref 0.0–0.3)
BILIRUBIN TOTAL: 0.5 mg/dL (ref 0.2–1.2)
Total Protein: 7.3 g/dL (ref 6.0–8.3)

## 2014-01-17 LAB — TSH: TSH: 0.43 u[IU]/mL (ref 0.35–4.50)

## 2014-01-17 LAB — VITAMIN D 25 HYDROXY (VIT D DEFICIENCY, FRACTURES): VITD: 30.18 ng/mL (ref 30.00–100.00)

## 2014-02-04 DIAGNOSIS — N809 Endometriosis, unspecified: Secondary | ICD-10-CM | POA: Insufficient documentation

## 2014-02-23 ENCOUNTER — Encounter: Payer: Self-pay | Admitting: Family Medicine

## 2014-02-23 ENCOUNTER — Ambulatory Visit (INDEPENDENT_AMBULATORY_CARE_PROVIDER_SITE_OTHER): Payer: BC Managed Care – PPO | Admitting: Family Medicine

## 2014-02-23 VITALS — BP 134/84 | HR 110 | Temp 98.4°F | Resp 16 | Wt 209.4 lb

## 2014-02-23 DIAGNOSIS — M62838 Other muscle spasm: Secondary | ICD-10-CM

## 2014-02-23 DIAGNOSIS — R519 Headache, unspecified: Secondary | ICD-10-CM | POA: Insufficient documentation

## 2014-02-23 DIAGNOSIS — M549 Dorsalgia, unspecified: Secondary | ICD-10-CM

## 2014-02-23 DIAGNOSIS — R319 Hematuria, unspecified: Secondary | ICD-10-CM

## 2014-02-23 DIAGNOSIS — R51 Headache: Secondary | ICD-10-CM | POA: Insufficient documentation

## 2014-02-23 DIAGNOSIS — IMO0001 Reserved for inherently not codable concepts without codable children: Secondary | ICD-10-CM

## 2014-02-23 LAB — CBC WITH DIFFERENTIAL/PLATELET
Basophils Absolute: 0 10*3/uL (ref 0.0–0.1)
Basophils Relative: 0.3 % (ref 0.0–3.0)
Eosinophils Absolute: 0.1 10*3/uL (ref 0.0–0.7)
Eosinophils Relative: 0.9 % (ref 0.0–5.0)
HEMATOCRIT: 39.7 % (ref 36.0–46.0)
Hemoglobin: 13.1 g/dL (ref 12.0–15.0)
Lymphocytes Relative: 15.2 % (ref 12.0–46.0)
Lymphs Abs: 1.6 10*3/uL (ref 0.7–4.0)
MCHC: 33.1 g/dL (ref 30.0–36.0)
MCV: 85.7 fl (ref 78.0–100.0)
MONO ABS: 0.5 10*3/uL (ref 0.1–1.0)
MONOS PCT: 4.6 % (ref 3.0–12.0)
Neutro Abs: 8.5 10*3/uL — ABNORMAL HIGH (ref 1.4–7.7)
Neutrophils Relative %: 79 % — ABNORMAL HIGH (ref 43.0–77.0)
PLATELETS: 429 10*3/uL — AB (ref 150.0–400.0)
RBC: 4.63 Mil/uL (ref 3.87–5.11)
RDW: 13.6 % (ref 11.5–15.5)
WBC: 10.8 10*3/uL — ABNORMAL HIGH (ref 4.0–10.5)

## 2014-02-23 LAB — POCT URINALYSIS DIPSTICK
Bilirubin, UA: NEGATIVE
Glucose, UA: NEGATIVE
Ketones, UA: NEGATIVE
LEUKOCYTES UA: NEGATIVE
Nitrite, UA: NEGATIVE
PH UA: 7
Protein, UA: NEGATIVE
Spec Grav, UA: 1.01
Urobilinogen, UA: 0.2

## 2014-02-23 LAB — SEDIMENTATION RATE: SED RATE: 7 mm/h (ref 0–22)

## 2014-02-23 MED ORDER — NAPROXEN 500 MG PO TABS: 500.0000 mg | ORAL_TABLET | Freq: Two times a day (BID) | ORAL | Status: DC

## 2014-02-23 MED ORDER — CYCLOBENZAPRINE HCL 10 MG PO TABS: 10.0000 mg | ORAL_TABLET | Freq: Three times a day (TID) | ORAL | Status: DC | PRN

## 2014-02-23 MED ORDER — MELOXICAM 15 MG PO TABS: 15.0000 mg | ORAL_TABLET | Freq: Every day | ORAL | Status: DC

## 2014-02-23 MED ORDER — KETOROLAC TROMETHAMINE 60 MG/2ML IM SOLN
60.0000 mg | Freq: Once | INTRAMUSCULAR | Status: AC
  Administered 2014-02-23: 60 mg via INTRAMUSCULAR

## 2014-02-23 NOTE — Patient Instructions (Signed)
Follow up as needed Start the Meloxicam (Mobic) tomorrow morning- take w/ food to avoid upset stomach Use the flexeril (muscle relaxer) as needed for pain and spasm- will cause drowsiness We'll notify you of your lab results and make any changes if needed HEAT for pain relief If your symptoms change or worsen, please call or go to the ER Tylenol as needed for fever- no ibuprofen since you had the shot and start the mobic tomorrow Call with any questions or concerns Hang in there!

## 2014-02-23 NOTE — Progress Notes (Signed)
   Subjective:    Patient ID: Lorraine Conrad, female    DOB: Dec 03, 1970, 43 y.o.   MRN: [id number]  HPI sxs started Sunday w/ leg pain.  Now w/ severe HA since Monday.  No sinus pain/pressure.  Bilateral ear pain.  Mild sore throat.  No cough.  Tm 100.4 last night w/ chills.  Increased body aches.  Mild nausea, no vomiting.  No diarrhea.  Pt having very difficult time answering any questions- does this feel like your previous migraines?  'i don't know'.  Which symptom is most concerning?  'i don't know'.  What made you schedule this appt today?  'i can't say'.  Tangential thought process- 'i started playing tennis 2 weeks ago'.   Review of Systems For ROS see HPI     Objective:   Physical Exam  Vitals reviewed. Constitutional: She is oriented to person, place, and time. She appears well-developed and well-nourished. No distress.  HENT:  Head: Normocephalic and atraumatic.  Mouth/Throat: Oropharynx is clear and moist. No oropharyngeal exudate.  No TTP over sinuses Normal TMs bilaterally  Eyes: Conjunctivae and EOM are normal. Pupils are equal, round, and reactive to light.  Neck: Normal range of motion. Neck supple.  No meningeal signs  Cardiovascular: Normal rate, regular rhythm, normal heart sounds and intact distal pulses.   Pulmonary/Chest: Effort normal and breath sounds normal. No respiratory distress. She has no wheezes. She has no rales.  Abdominal: Soft. Bowel sounds are normal. She exhibits no distension. There is no tenderness. There is no rebound.  Musculoskeletal: She exhibits no edema.  Lymphadenopathy:    She has no cervical adenopathy.  Neurological: She is alert and oriented to person, place, and time. No cranial nerve deficit.  Antalgic gait  Skin: Skin is warm and dry.  Psychiatric:  Flat affect, withdrawn Tangential thought process Difficulty answering questions          Assessment & Plan:

## 2014-02-23 NOTE — Progress Notes (Signed)
Pre visit review using our clinic review tool, if applicable. No additional management support is needed unless otherwise documented below in the visit note. 

## 2014-02-24 ENCOUNTER — Encounter (HOSPITAL_BASED_OUTPATIENT_CLINIC_OR_DEPARTMENT_OTHER): Payer: Self-pay | Admitting: Emergency Medicine

## 2014-02-24 ENCOUNTER — Telehealth: Payer: Self-pay | Admitting: Family Medicine

## 2014-02-24 ENCOUNTER — Emergency Department (HOSPITAL_BASED_OUTPATIENT_CLINIC_OR_DEPARTMENT_OTHER)
Admission: EM | Admit: 2014-02-24 | Discharge: 2014-02-24 | Disposition: A | Discharge status: Home / Self Care | Payer: BC Managed Care – PPO | Attending: Emergency Medicine | Admitting: Emergency Medicine

## 2014-02-24 DIAGNOSIS — Z8659 Personal history of other mental and behavioral disorders: Secondary | ICD-10-CM | POA: Insufficient documentation

## 2014-02-24 DIAGNOSIS — G44209 Tension-type headache, unspecified, not intractable: Secondary | ICD-10-CM | POA: Diagnosis not present

## 2014-02-24 DIAGNOSIS — Z8742 Personal history of other diseases of the female genital tract: Secondary | ICD-10-CM | POA: Diagnosis not present

## 2014-02-24 DIAGNOSIS — Z862 Personal history of diseases of the blood and blood-forming organs and certain disorders involving the immune mechanism: Secondary | ICD-10-CM | POA: Insufficient documentation

## 2014-02-24 DIAGNOSIS — K219 Gastro-esophageal reflux disease without esophagitis: Secondary | ICD-10-CM | POA: Insufficient documentation

## 2014-02-24 DIAGNOSIS — Z79899 Other long term (current) drug therapy: Secondary | ICD-10-CM | POA: Insufficient documentation

## 2014-02-24 DIAGNOSIS — M129 Arthropathy, unspecified: Secondary | ICD-10-CM | POA: Diagnosis not present

## 2014-02-24 DIAGNOSIS — M542 Cervicalgia: Secondary | ICD-10-CM | POA: Insufficient documentation

## 2014-02-24 DIAGNOSIS — Z8679 Personal history of other diseases of the circulatory system: Secondary | ICD-10-CM | POA: Diagnosis not present

## 2014-02-24 DIAGNOSIS — IMO0002 Reserved for concepts with insufficient information to code with codable children: Secondary | ICD-10-CM | POA: Diagnosis not present

## 2014-02-24 DIAGNOSIS — Z791 Long term (current) use of non-steroidal anti-inflammatories (NSAID): Secondary | ICD-10-CM | POA: Insufficient documentation

## 2014-02-24 LAB — CBC WITH DIFFERENTIAL/PLATELET
BASOS PCT: 0 % (ref 0–1)
Basophils Absolute: 0 10*3/uL (ref 0.0–0.1)
Eosinophils Absolute: 0.3 10*3/uL (ref 0.0–0.7)
Eosinophils Relative: 3 % (ref 0–5)
HCT: 40.5 % (ref 36.0–46.0)
Hemoglobin: 13.2 g/dL (ref 12.0–15.0)
Lymphocytes Relative: 23 % (ref 12–46)
Lymphs Abs: 2.3 10*3/uL (ref 0.7–4.0)
MCH: 28.8 pg (ref 26.0–34.0)
MCHC: 32.6 g/dL (ref 30.0–36.0)
MCV: 88.2 fL (ref 78.0–100.0)
Monocytes Absolute: 0.8 10*3/uL (ref 0.1–1.0)
Monocytes Relative: 8 % (ref 3–12)
NEUTROS PCT: 66 % (ref 43–77)
Neutro Abs: 6.7 10*3/uL (ref 1.7–7.7)
PLATELETS: 428 10*3/uL — AB (ref 150–400)
RBC: 4.59 MIL/uL (ref 3.87–5.11)
RDW: 13.8 % (ref 11.5–15.5)
WBC: 10.2 10*3/uL (ref 4.0–10.5)

## 2014-02-24 LAB — BASIC METABOLIC PANEL
Anion gap: 13 (ref 5–15)
BUN: 6 mg/dL (ref 6–23)
CO2: 25 mEq/L (ref 19–32)
Calcium: 9.1 mg/dL (ref 8.4–10.5)
Chloride: 101 mEq/L (ref 96–112)
Creatinine, Ser: 0.9 mg/dL (ref 0.50–1.10)
GFR calc Af Amer: >90 mL/min (ref >90)
GFR, EST NON AFRICAN AMERICAN: 78 mL/min — AB (ref >90)
Glucose, Bld: 96 mg/dL (ref 70–99)
POTASSIUM: 3.8 meq/L (ref 3.7–5.3)
SODIUM: 139 meq/L (ref 137–147)

## 2014-02-24 MED ORDER — DEXAMETHASONE SODIUM PHOSPHATE 4 MG/ML IJ SOLN
INTRAMUSCULAR | Status: AC
  Administered 2014-02-24: 10 mg
  Filled 2014-02-24: qty 3

## 2014-02-24 MED ORDER — MORPHINE SULFATE 4 MG/ML IJ SOLN
4.0000 mg | Freq: Once | INTRAMUSCULAR | Status: AC
  Administered 2014-02-24: 4 mg via INTRAVENOUS
  Filled 2014-02-24: qty 1

## 2014-02-24 MED ORDER — DIPHENHYDRAMINE HCL 50 MG/ML IJ SOLN
12.5000 mg | Freq: Once | INTRAMUSCULAR | Status: AC
  Administered 2014-02-24: 12.5 mg via INTRAVENOUS
  Filled 2014-02-24: qty 1

## 2014-02-24 MED ORDER — METOCLOPRAMIDE HCL 5 MG/ML IJ SOLN
10.0000 mg | Freq: Once | INTRAMUSCULAR | Status: AC
  Administered 2014-02-24: 10 mg via INTRAVENOUS
  Filled 2014-02-24: qty 2

## 2014-02-24 MED ORDER — KETOROLAC TROMETHAMINE 30 MG/ML IJ SOLN
30.0000 mg | Freq: Once | INTRAMUSCULAR | Status: AC
  Administered 2014-02-24: 30 mg via INTRAVENOUS
  Filled 2014-02-24: qty 1

## 2014-02-24 MED ORDER — SODIUM CHLORIDE 0.9 % IV BOLUS (SEPSIS)
1000.0000 mL | Freq: Once | INTRAVENOUS | Status: AC
  Administered 2014-02-24: 1000 mL via INTRAVENOUS

## 2014-02-24 MED ORDER — DEXAMETHASONE SODIUM PHOSPHATE 10 MG/ML IJ SOLN: 10.0000 mg | Freq: Once | INTRAMUSCULAR | Status: AC

## 2014-02-24 MED ORDER — BUTALBITAL-APAP-CAFFEINE 50-325-40 MG PO TABS
1.0000 | ORAL_TABLET | ORAL | Status: DC
  Filled 2014-02-24: qty 1

## 2014-02-24 MED ORDER — BUTALBITAL-APAP-CAFFEINE 50-325-40 MG PO TABS: 1.0000 | ORAL_TABLET | Freq: Four times a day (QID) | ORAL | Status: DC | PRN

## 2014-02-24 NOTE — Telephone Encounter (Signed)
Headache and body aches are back, the pain is so bad she can barely open her eyes. Can something be called in? Does she need to go to er? Please advise

## 2014-02-24 NOTE — Discharge Instructions (Signed)
Tension Headache °A tension headache is pain, pressure, or aching felt over the front and sides of the head. Tension headaches often come after stress, feeling worried (anxiety), or feeling sad or down for a while (depressed). °HOME CARE °· Only take medicine as told by your doctor. °· Lie down in a dark, quiet room when you have a headache. °· Keep a journal to find out if certain things bring on headaches. For example, write down: °¨ What you eat and drink. °¨ How much sleep you get. °¨ Any change to your diet or medicines. °· Relax by getting a massage or doing other relaxing activities. °· Put ice or heat packs on the head and neck area as told by your doctor. °· Lessen stress. °· Sit up straight. Do not tighten (tense) your muscles. °· Quit smoking if you smoke. °· Lessen how much alcohol you drink. °· Lessen how much caffeine you drink, or stop drinking caffeine. °· Eat and exercise regularly. °· Get enough sleep. °· Avoid using too much pain medicine. °GET HELP RIGHT AWAY IF:  °· Your headache becomes really bad. °· You have a fever. °· You have a stiff neck. °· You have trouble seeing. °· Your muscles are weak, or you lose muscle control. °· You lose your balance or have trouble walking. °· You feel like you will pass out (faint), or you pass out. °· You have really bad symptoms that are different than your first symptoms. °· You have problems with the medicines given to you by your doctor. °· Your medicines do not work. °· Your headache feels different than the other headaches. °· You feel sick to your stomach (nauseous) or throw up (vomit). °MAKE SURE YOU:  °· Understand these instructions. °· Will watch your condition. °· Will get help right away if you are not doing well or get worse. °Document Released: 08/20/2009 Document Revised: 08/18/2011 Document Reviewed: 05/16/2011 °[employer]® Patient Information ©2015 [employer]. This information is not intended to replace advice given to you by your health  care provider. Make sure you discuss any questions you have with your health care provider. ° °

## 2014-02-24 NOTE — Assessment & Plan Note (Signed)
Pt has hx of migraines but pt is unable to relate whether this is similar.  No meningeal signs on PE.  Pt does have photophobia in office.  Will give toradol for HA and pain.  Get labs to r/o infxn and systemic inflammation.  Reviewed supportive care and red flags that should prompt return.  Pt expressed understanding and is in agreement w/ plan.

## 2014-02-24 NOTE — Telephone Encounter (Signed)
Based on pt's description of visual changes, 9/10 pain, and associated vomiting- pt needs to go to ER for evaluation

## 2014-02-24 NOTE — ED Notes (Addendum)
Pt c/o neck pain and upper back pain x 6 days fever at home 100.4

## 2014-02-24 NOTE — ED Notes (Signed)
MD at bedside. 

## 2014-02-24 NOTE — ED Provider Notes (Signed)
CSN: [id number]     Arrival date & time 02/24/14  1743 History   First MD Initiated Contact with Patient 02/24/14 1801     Chief Complaint  Patient presents with  . Neck Pain     (Consider location/radiation/quality/duration/timing/severity/associated sxs/prior Treatment) Patient is a 43 y.o. female presenting with neck pain. The history is provided by the patient and the spouse. No language interpreter was used.  Neck Pain Pain location:  Occipital region Quality:  Aching Pain severity:  Moderate Onset quality:  Gradual Duration:  6 days Timing:  Constant Associated symptoms: headaches   Associated symptoms: no chest pain, no fever and no weakness   Associated symptoms comment:  Pain in neck radiating forward to parietal and temporal scalp causing a moderate to severe headache for the past 6 days. She has had chills with report of low grade fever at home. She has a remote history of headaches but 'different' than current headache. Nausea without vomiting. No cough, congestion or sore throat. She was seen by her PCP Birdie Riddle) yesterday and given IM Toradol which offered partial and temporary relief, but pain returned over last night and she was instructed to come to ED for further evaluation today by her doctor. No rash, known tick exposure. No sick contacts.    Past Medical History  Diagnosis Date  . GERD (gastroesophageal reflux disease)   . Headache(784.0)   . Endometriosis   . Ovarian cyst, left   . External hemorrhoids   . Erosive gastritis   . Proctalgia fugax   . Allergy     SEASONAL  . Anemia   . Anxiety     PT. DENIES   Past Surgical History  Procedure Laterality Date  . Cesarean section      x 2  . Temporomandibular joint surgery    . Sinus surgery with instatrak    . Laparoscopic ovarian cystectomy    . Upper gastrointestinal endoscopy    . Colonoscopy    . Abdominal hysterectomy     Family History  Problem Relation Age of Onset  . Breast cancer Maternal  Grandmother   . Colon cancer Neg Hx   . Prostate cancer Paternal Grandfather   . Colon polyps Father   . Heart disease Father    History  Substance Use Topics  . Smoking status: Never Smoker   . Smokeless tobacco: Never Used  . Alcohol Use: Yes     Comment: SOCIALLY   OB History   Grav Para Term Preterm Abortions TAB SAB Ect Mult Living                 Review of Systems  Constitutional: Positive for chills. Negative for fever.  HENT: Negative for congestion, sore throat and trouble swallowing.   Respiratory: Negative.  Negative for cough and shortness of breath.   Cardiovascular: Negative.  Negative for chest pain.  Gastrointestinal: Positive for nausea. Negative for vomiting and abdominal pain.  Genitourinary: Negative.  Negative for dysuria.  Musculoskeletal: Positive for neck pain.  Skin: Negative for rash.  Neurological: Positive for headaches. Negative for dizziness, syncope, speech difficulty and weakness.      Allergies  Promethazine hcl; Codeine; Dilaudid; and Azithromycin  Home Medications   Prior to Admission medications   Medication Sig Start Date End Date Taking? Authorizing Provider  acetaminophen (TYLENOL) 500 MG tablet Take 1,000 mg by mouth 2 (two) times daily as needed for pain. For pain    Historical Provider, MD  clobetasol cream (TEMOVATE)  0.05 %  11/17/12   Historical Provider, MD  cyclobenzaprine (FLEXERIL) 10 MG tablet Take 1 tablet (10 mg total) by mouth 3 (three) times daily as needed for muscle spasms. 02/23/14   Midge Minium, MD  dicyclomine (BENTYL) 20 MG tablet Take 20 mg by mouth every 6 (six) hours as needed. 03/18/13   Midge Minium, MD  glycopyrrolate (ROBINUL) 1 MG tablet Take 1 mg by mouth 2 (two) times daily as needed. 02/21/13   Ladene Artist, MD  ibuprofen (ADVIL,MOTRIN) 200 MG tablet Take 400 mg by mouth every 6 (six) hours as needed for pain. For pain    Historical Provider, MD  Meth-Hyo-M Bl-Na Phos-Ph Sal (URIBEL) 118 MG  CAPS Take 2 capsules by mouth as needed. 10/28/13   Historical Provider, MD  naproxen (NAPROSYN) 500 MG tablet Take 1 tablet (500 mg total) by mouth 2 (two) times daily with a meal. 02/23/14 02/23/15  Midge Minium, MD  omeprazole (PRILOSEC) 20 MG capsule Take 1 capsule (20 mg total) by mouth daily. 10/09/12   Dot Lanes, MD  ondansetron (ZOFRAN-ODT) 4 MG disintegrating tablet Take 4 mg by mouth as needed. 02/16/13   Willia Craze, NP   BP 138/88  Pulse 113  Temp(Src) 99.2 F (37.3 C) (Oral)  Resp 18  Ht 5\' 4"  (1.626 m)  SpO2 100%  LMP 02/08/2013 Physical Exam  Constitutional: She is oriented to person, place, and time. She appears well-developed and well-nourished.  HENT:  Head: Normocephalic.  Neck: Normal range of motion. Neck supple.  No meningeal signs.   Cardiovascular: Normal rate and regular rhythm.   Pulmonary/Chest: Effort normal and breath sounds normal.  Abdominal: Soft. Bowel sounds are normal. There is no tenderness. There is no rebound and no guarding.  Musculoskeletal: Normal range of motion.  Mild neck tenderness bilaterally without swelling.  Lymphadenopathy:    She has no cervical adenopathy.  Neurological: She is alert and oriented to person, place, and time.  Skin: Skin is warm and dry. No rash noted.  Psychiatric: She has a normal mood and affect.    ED Course  Procedures (including critical care time) Labs Review Labs Reviewed  CBC WITH DIFFERENTIAL - Abnormal; Notable for the following:    Platelets 428 (*)    All other components within normal limits  BASIC METABOLIC PANEL - Abnormal; Notable for the following:    GFR calc non Af Amer 78 (*)    All other components within normal limits   Results for orders placed during the hospital encounter of 02/24/14  CBC WITH DIFFERENTIAL      Result Value Ref Range   WBC 10.2  4.0 - 10.5 K/uL   RBC 4.59  3.87 - 5.11 MIL/uL   Hemoglobin 13.2  12.0 - 15.0 g/dL   HCT 40.5  36.0 - 46.0 %   MCV 88.2   78.0 - 100.0 fL   MCH 28.8  26.0 - 34.0 pg   MCHC 32.6  30.0 - 36.0 g/dL   RDW 13.8  11.5 - 15.5 %   Platelets 428 (*) 150 - 400 K/uL   Neutrophils Relative % 66  43 - 77 %   Neutro Abs 6.7  1.7 - 7.7 K/uL   Lymphocytes Relative 23  12 - 46 %   Lymphs Abs 2.3  0.7 - 4.0 K/uL   Monocytes Relative 8  3 - 12 %   Monocytes Absolute 0.8  0.1 - 1.0 K/uL   Eosinophils  Relative 3  0 - 5 %   Eosinophils Absolute 0.3  0.0 - 0.7 K/uL   Basophils Relative 0  0 - 1 %   Basophils Absolute 0.0  0.0 - 0.1 K/uL  BASIC METABOLIC PANEL      Result Value Ref Range   Sodium 139  137 - 147 mEq/L   Potassium 3.8  3.7 - 5.3 mEq/L   Chloride 101  96 - 112 mEq/L   CO2 25  19 - 32 mEq/L   Glucose, Bld 96  70 - 99 mg/dL   BUN 6  6 - 23 mg/dL   Creatinine, Ser 0.90  0.50 - 1.10 mg/dL   Calcium 9.1  8.4 - 10.5 mg/dL   GFR calc non Af Amer 78 (*) >90 mL/min   GFR calc Af Amer >90  >90 mL/min   Anion gap 13  5 - 15    Imaging Review No results found.   EKG Interpretation None      MDM   Final diagnoses:  None    1. Tension headache  No meningeal signs on exam. No fever, or abnormal lab studies. Do not suspect infectious process. There is neck and scalp tenderness to suggest muscular component, ?tension type headache.   Pain medications in ED (Toradol, Reglan, Benadryl, Morphine and Decadron) with improvement in symptoms. The patient is examined by Dr. Aline Brochure. Plan of care, including discharge home and PCP follow up discussed and agreeable to patient and husband. Vital signs improved.     Dewaine Oats, PA-C 02/24/14 2150

## 2014-02-24 NOTE — Telephone Encounter (Signed)
C/o: Pt continues to experience excruciating HA, neck pain, and pain down her back and legs.  Overall pain rated 9/10. Describes HA as a constant pressure.  It hurts to open her eyes. Eyes are sensitive to lights.  States that when she turns her head to the side she sees iridescent colors similars to the colors seen on bubbles.  Denies n/v today, however during the middle of the night last night she was nauseated and was vomiting bile.  Low grade fever-100.4.  Lack of appetite and energy.  She has been laying around all day. Denies  Pt was seen yesterday and was given Toradol.  Toradol helped, but pain returned during the middle of the night last night.    Please advise.

## 2014-02-24 NOTE — Assessment & Plan Note (Signed)
New.  Pt is having diffuse body aches- leg, back, headache.  Pt unable to provide much of a history.  Suspect viral etiology but will get labs to r/o systemic inflammation.  toradol for pain.  Reviewed supportive care and red flags that should prompt return.  Pt expressed understanding and is in agreement w/ plan.

## 2014-02-24 NOTE — ED Notes (Signed)
Pt reports diffuse headache x 1 week. Alternating tylenol and ibuprofen with no relief.  Reports light sensitivity and some nausea. Toradol provided yesterday gave some relief but came back in the night.

## 2014-02-24 NOTE — ED Notes (Signed)
PA at bedside.

## 2014-02-24 NOTE — Telephone Encounter (Signed)
Pt notified and made aware.  She stated understanding and agreed with plan.  Pt plans to call her husband and go to the nearest ER.

## 2014-02-24 NOTE — Telephone Encounter (Signed)
Please call and triage pt.  sxs were very vague in office yesterday and labs were normal.

## 2014-02-25 LAB — URINE CULTURE: Colony Count: 9000

## 2014-02-25 NOTE — ED Provider Notes (Signed)
Medical screening examination/treatment/procedure(s) were conducted as a shared visit with non-physician practitioner(s) and myself.  I personally evaluated the patient during the encounter.   EKG Interpretation None      I interviewed and examined the patient. Lungs are CTAB. Cardiac exam wnl. Abdomen soft.  Normal rom of neck. Pt w/ occipital HA. Pt w/ hx of HA's. Do not think imaging warranted and I had this discussion w/ the pt who agrees. Pain improved w/ tx here. Will rec f/u w/ pcp as needed, return for worsening.   Pamella Pert, MD 02/25/14 1017

## 2014-02-27 ENCOUNTER — Encounter: Payer: Self-pay | Admitting: Medical

## 2014-02-27 ENCOUNTER — Encounter (HOSPITAL_BASED_OUTPATIENT_CLINIC_OR_DEPARTMENT_OTHER): Payer: Self-pay | Admitting: Emergency Medicine

## 2014-02-27 ENCOUNTER — Ambulatory Visit (INDEPENDENT_AMBULATORY_CARE_PROVIDER_SITE_OTHER): Payer: BC Managed Care – PPO | Admitting: Medical

## 2014-02-27 ENCOUNTER — Emergency Department (HOSPITAL_BASED_OUTPATIENT_CLINIC_OR_DEPARTMENT_OTHER)
Admission: EM | Admit: 2014-02-27 | Discharge: 2014-02-27 | Disposition: A | Discharge status: Home / Self Care | Payer: BC Managed Care – PPO | Attending: Emergency Medicine | Admitting: Emergency Medicine

## 2014-02-27 ENCOUNTER — Ambulatory Visit (HOSPITAL_BASED_OUTPATIENT_CLINIC_OR_DEPARTMENT_OTHER)
Admission: RE | Admit: 2014-02-27 | Discharge: 2014-02-27 | Disposition: A | Discharge status: Home / Self Care | Payer: BC Managed Care – PPO | Source: Ambulatory Visit | Attending: Medical | Admitting: Medical

## 2014-02-27 VITALS — BP 118/88 | HR 106 | Temp 98.5°F | Ht 64.75 in | Wt 212.2 lb

## 2014-02-27 DIAGNOSIS — R51 Headache: Secondary | ICD-10-CM

## 2014-02-27 DIAGNOSIS — K2960 Other gastritis without bleeding: Secondary | ICD-10-CM | POA: Diagnosis not present

## 2014-02-27 DIAGNOSIS — Z8742 Personal history of other diseases of the female genital tract: Secondary | ICD-10-CM | POA: Diagnosis not present

## 2014-02-27 DIAGNOSIS — K219 Gastro-esophageal reflux disease without esophagitis: Secondary | ICD-10-CM | POA: Insufficient documentation

## 2014-02-27 DIAGNOSIS — Z862 Personal history of diseases of the blood and blood-forming organs and certain disorders involving the immune mechanism: Secondary | ICD-10-CM | POA: Diagnosis not present

## 2014-02-27 DIAGNOSIS — Z79899 Other long term (current) drug therapy: Secondary | ICD-10-CM | POA: Insufficient documentation

## 2014-02-27 DIAGNOSIS — Z8679 Personal history of other diseases of the circulatory system: Secondary | ICD-10-CM | POA: Diagnosis not present

## 2014-02-27 DIAGNOSIS — Z8659 Personal history of other mental and behavioral disorders: Secondary | ICD-10-CM | POA: Insufficient documentation

## 2014-02-27 DIAGNOSIS — G4459 Other complicated headache syndrome: Secondary | ICD-10-CM

## 2014-02-27 DIAGNOSIS — Z791 Long term (current) use of non-steroidal anti-inflammatories (NSAID): Secondary | ICD-10-CM | POA: Diagnosis not present

## 2014-02-27 LAB — BASIC METABOLIC PANEL
Anion gap: 11 (ref 5–15)
BUN: 8 mg/dL (ref 6–23)
CO2: 27 mEq/L (ref 19–32)
Calcium: 8.9 mg/dL (ref 8.4–10.5)
Chloride: 100 mEq/L (ref 96–112)
Creatinine, Ser: 0.9 mg/dL (ref 0.50–1.10)
GFR calc Af Amer: >90 mL/min (ref >90)
GFR calc non Af Amer: 78 mL/min — ABNORMAL LOW (ref >90)
Glucose, Bld: 89 mg/dL (ref 70–99)
Potassium: 4.1 mEq/L (ref 3.7–5.3)
Sodium: 138 mEq/L (ref 137–147)

## 2014-02-27 LAB — CBC WITH DIFFERENTIAL/PLATELET
Basophils Absolute: 0 10*3/uL (ref 0.0–0.1)
Basophils Relative: 0 % (ref 0–1)
EOS ABS: 0.2 10*3/uL (ref 0.0–0.7)
Eosinophils Relative: 2 % (ref 0–5)
HCT: 38.1 % (ref 36.0–46.0)
Hemoglobin: 12.4 g/dL (ref 12.0–15.0)
LYMPHS ABS: 2.4 10*3/uL (ref 0.7–4.0)
Lymphocytes Relative: 22 % (ref 12–46)
MCH: 28.7 pg (ref 26.0–34.0)
MCHC: 32.5 g/dL (ref 30.0–36.0)
MCV: 88.2 fL (ref 78.0–100.0)
Monocytes Absolute: 0.7 10*3/uL (ref 0.1–1.0)
Monocytes Relative: 7 % (ref 3–12)
Neutro Abs: 7.3 10*3/uL (ref 1.7–7.7)
Neutrophils Relative %: 69 % (ref 43–77)
PLATELETS: 390 10*3/uL (ref 150–400)
RBC: 4.32 MIL/uL (ref 3.87–5.11)
RDW: 13.7 % (ref 11.5–15.5)
WBC: 10.7 10*3/uL — AB (ref 4.0–10.5)

## 2014-02-27 MED ORDER — METOCLOPRAMIDE HCL 5 MG/ML IJ SOLN
10.0000 mg | Freq: Once | INTRAMUSCULAR | Status: AC
  Administered 2014-02-27: 10 mg via INTRAVENOUS
  Filled 2014-02-27: qty 2

## 2014-02-27 MED ORDER — MORPHINE SULFATE 2 MG/ML IJ SOLN
2.0000 mg | Freq: Once | INTRAMUSCULAR | Status: AC
  Administered 2014-02-27: 2 mg via INTRAVENOUS
  Filled 2014-02-27: qty 1

## 2014-02-27 MED ORDER — SODIUM CHLORIDE 0.9 % IV SOLN
INTRAVENOUS | Status: DC
  Administered 2014-02-27: 18:00:00 via INTRAVENOUS

## 2014-02-27 MED ORDER — KETOROLAC TROMETHAMINE 60 MG/2ML IM SOLN
60.0000 mg | Freq: Once | INTRAMUSCULAR | Status: AC
  Administered 2014-02-27: 60 mg via INTRAMUSCULAR

## 2014-02-27 MED ORDER — SUMATRIPTAN SUCCINATE 50 MG PO TABS: 50.0000 mg | ORAL_TABLET | ORAL | Status: DC | PRN

## 2014-02-27 NOTE — ED Provider Notes (Signed)
CSN: [id number]     Arrival date & time 02/27/14  1637 History   First MD Initiated Contact with Patient 02/27/14 1732     Chief Complaint  Patient presents with  . Headache     (Consider location/radiation/quality/duration/timing/severity/associated sxs/prior Treatment) Patient is a 43 y.o. female presenting with headaches. The history is provided by the patient.  Headache Associated symptoms: nausea    Lorraine Conrad is a 43 y.o. female who presents to the ED with a headache. She was evaluated by her PCP today and sent for a CT of her head. She was evaluated here in the ED 3 days ago for same. She has been taking ibuprofen and flexeril without relief. She was given Rx for Fioricet but has not started it. She was given Rx for Imitrex by her PCP.  Past Medical History  Diagnosis Date  . GERD (gastroesophageal reflux disease)   . Headache(784.0)   . Endometriosis   . Ovarian cyst, left   . External hemorrhoids   . Erosive gastritis   . Proctalgia fugax   . Allergy     SEASONAL  . Anemia   . Anxiety     PT. DENIES   Past Surgical History  Procedure Laterality Date  . Cesarean section      x 2  . Temporomandibular joint surgery    . Sinus surgery with instatrak    . Laparoscopic ovarian cystectomy    . Upper gastrointestinal endoscopy    . Colonoscopy    . Abdominal hysterectomy     Family History  Problem Relation Age of Onset  . Breast cancer Maternal Grandmother   . Colon cancer Neg Hx   . Prostate cancer Paternal Grandfather   . Colon polyps Father   . Heart disease Father    History  Substance Use Topics  . Smoking status: Never Smoker   . Smokeless tobacco: Never Used  . Alcohol Use: Yes     Comment: SOCIALLY   OB History   Grav Para Term Preterm Abortions TAB SAB Ect Mult Living                 Review of Systems  Gastrointestinal: Positive for nausea.  Neurological: Positive for headaches.  All other systems negative    Allergies  Promethazine  hcl; Codeine; Dilaudid; and Azithromycin  Home Medications   Prior to Admission medications   Medication Sig Start Date End Date Taking? Authorizing Provider  acetaminophen (TYLENOL) 500 MG tablet Take 1,000 mg by mouth 2 (two) times daily as needed for pain. For pain    Historical Provider, MD  butalbital-acetaminophen-caffeine (FIORICET) 50-325-40 MG per tablet Take 1-2 tablets by mouth every 6 (six) hours as needed for headache. 02/24/14 02/24/15  Nehemiah Settle A Upstill, PA-C  clobetasol cream (TEMOVATE) 0.05 %  11/17/12   Historical Provider, MD  cyclobenzaprine (FLEXERIL) 10 MG tablet Take 1 tablet (10 mg total) by mouth 3 (three) times daily as needed for muscle spasms. 02/23/14   Midge Minium, MD  dicyclomine (BENTYL) 20 MG tablet Take 20 mg by mouth every 6 (six) hours as needed. 03/18/13   Midge Minium, MD  glycopyrrolate (ROBINUL) 1 MG tablet Take 1 mg by mouth 2 (two) times daily as needed. 02/21/13   Ladene Artist, MD  ibuprofen (ADVIL,MOTRIN) 200 MG tablet Take 400 mg by mouth every 6 (six) hours as needed for pain. For pain    Historical Provider, MD  lansoprazole (PREVACID) 30 MG capsule Take 30  mg by mouth daily at 12 noon.    Historical Provider, MD  Meth-Hyo-M Bl-Na Phos-Ph Sal (URIBEL) 118 MG CAPS Take 2 capsules by mouth as needed. 10/28/13   Historical Provider, MD  naproxen (NAPROSYN) 500 MG tablet Take 1 tablet (500 mg total) by mouth 2 (two) times daily with a meal. 02/23/14 02/23/15  Midge Minium, MD  ondansetron (ZOFRAN-ODT) 4 MG disintegrating tablet Take 4 mg by mouth as needed. 02/16/13   Willia Craze, NP  SUMAtriptan (IMITREX) 50 MG tablet Take 1 tablet (50 mg total) by mouth every 2 (two) hours as needed for migraine or headache. May repeat in 2 hours if headache persists or recurs. Max dose is 2 tablets within 24 hour period. 02/27/14   Meriam Sprague Saguier, PA-C   BP 119/87  Pulse 94  Temp(Src) 98.2 F (36.8 C) (Oral)  Resp 16  Ht _0  (1.626 m)  Wt 212  lb (96.163 kg)  BMI 36.37 kg/m2  SpO2 100%  LMP 02/08/2013 Physical Exam  Constitutional: She is oriented to person, place, and time. She appears well-developed and well-nourished. No distress.  HENT:  Head: Normocephalic and atraumatic.  Right Ear: Tympanic membrane normal.  Left Ear: Tympanic membrane normal.  Nose: Nose normal.  Mouth/Throat: Uvula is midline, oropharynx is clear and moist and mucous membranes are normal.  Eyes: Conjunctivae and EOM are normal.  Neck: Normal range of motion. Neck supple.  Cardiovascular: Normal rate and regular rhythm.   Pulmonary/Chest: Effort normal. She has no wheezes. She has no rales.  Abdominal: Soft. Bowel sounds are normal. She exhibits no mass. There is no tenderness.  Musculoskeletal: She exhibits no edema.  Radial and pedal pulses strong, adequate circulation, good touch sensation.  Neurological: She is alert and oriented to person, place, and time. She has normal strength. No cranial nerve deficit or sensory deficit. She displays a negative Romberg sign. Gait normal.  Reflex Scores:      Bicep reflexes are 2+ on the right side and 2+ on the left side.      Brachioradialis reflexes are 2+ on the right side and 2+ on the left side.      Patellar reflexes are 2+ on the right side and 2+ on the left side.      Achilles reflexes are 2+ on the right side and 2+ on the left side. Rapid alternating movement without difficulty. Stands on one foot without difficulty.  Psychiatric: She has a normal mood and affect. Her behavior is normal.    ED Course  Procedures (including critical care time) Results for orders placed during the hospital encounter of 02/27/14 (from the past 72 hour(s))  CBC WITH DIFFERENTIAL     Status: Abnormal   Collection Time    02/27/14  6:15 PM      Result Value Ref Range   WBC 10.7 (*) 4.0 - 10.5 K/uL   RBC 4.32  3.87 - 5.11 MIL/uL   Hemoglobin 12.4  12.0 - 15.0 g/dL   HCT 38.1  36.0 - 46.0 %   MCV 88.2  78.0 -  100.0 fL   MCH 28.7  26.0 - 34.0 pg   MCHC 32.5  30.0 - 36.0 g/dL   RDW 13.7  11.5 - 15.5 %   Platelets 390  150 - 400 K/uL   Neutrophils Relative % 69  43 - 77 %   Neutro Abs 7.3  1.7 - 7.7 K/uL   Lymphocytes Relative 22  12 - 46 %  Lymphs Abs 2.4  0.7 - 4.0 K/uL   Monocytes Relative 7  3 - 12 %   Monocytes Absolute 0.7  0.1 - 1.0 K/uL   Eosinophils Relative 2  0 - 5 %   Eosinophils Absolute 0.2  0.0 - 0.7 K/uL   Basophils Relative 0  0 - 1 %   Basophils Absolute 0.0  0.0 - 0.1 K/uL  BASIC METABOLIC PANEL     Status: Abnormal   Collection Time    02/27/14  6:15 PM      Result Value Ref Range   Sodium 138  137 - 147 mEq/L   Potassium 4.1  3.7 - 5.3 mEq/L   Chloride 100  96 - 112 mEq/L   CO2 27  19 - 32 mEq/L   Glucose, Bld 89  70 - 99 mg/dL   BUN 8  6 - 23 mg/dL   Creatinine, Ser 0.90  0.50 - 1.10 mg/dL   Calcium 8.9  8.4 - 10.5 mg/dL   GFR calc non Af Amer 78 (*) >90 mL/min   GFR calc Af Amer >90  >90 mL/min   Comment: (NOTE)     The eGFR has been calculated using the CKD EPI equation.     This calculation has not been validated in all clinical situations.     eGFR's persistently <90 mL/min signify possible Chronic Kidney     Disease.   Anion gap 11  5 - 15   Iabs, IV fluids, pain management Labs Review Labs Reviewed - No data to display  Imaging Review Ct Head Wo Contrast  02/27/2014   CLINICAL DATA:  Headaches.  EXAM: CT HEAD WITHOUT CONTRAST  TECHNIQUE: Contiguous axial images were obtained from the base of the skull through the vertex without intravenous contrast.  COMPARISON:  10/09/2012.  FINDINGS: The ventricles are normal in size and configuration. No extra-axial fluid collections are identified. The gray-white differentiation is normal. No CT findings for acute intracranial process such as hemorrhage or infarction. No mass lesions. The brainstem and cerebellum are grossly normal.  The bony structures are intact. The paranasal sinuses and mastoid air cells are  clear. The globes are intact.  IMPRESSION: No acute intracranial findings or mass lesions to account for the patient's headaches.   Electronically Signed   By: Kalman Jewels M.D.   On: 02/27/2014 16:08   Re evaluation: after IV fluids, pain medication and medication for nausea the patient is feeling better and stable for discharge. Patient is scheduled for referral to a neurologist for further evaluation.  MDM  43 y.o. female with headache. Stable for discharge without neuro deficits. Patient to follow up with neuro as planned. She will return here as needed for any problems. I have reviewed this patient's vital signs, nurses notes, appropriate labs and imaging.  I have discussed findings and plan of care with the patient and she voices understanding.      [facility] Bunnie Pion, NP 03/02/14 1020

## 2014-02-27 NOTE — Assessment & Plan Note (Signed)
Headache has been moderate to severe since past Thursday. This is relatively new recurrent headache since Thursday. Very remote headache in the past about 17 years ago and then suddenly she got recurrent headaches again. She's been diet related by Dr. Birdie Riddle, the emergency department and myself. I gave her Toradol in the office and sent her down for stat CT of the head without contrast. I had a plan to treat her if her headache has subsided some and the test was negative. However she she told may on the way down to the CT she felt like she almost didn't make it. She states her headache was 9 at 10 despite the use of Toradol. So I decided to send her to the emergency department for further evaluation since her headache was severe. It is possible that the did an MRI or they may have evaluated her for meningitis to the fact that she does have some neck pain on exam.

## 2014-02-27 NOTE — ED Notes (Signed)
Pts PMD called, stating that the CT head was NEGATIVE

## 2014-02-27 NOTE — ED Notes (Addendum)
C/o HA x 1 week-was seen by PCP in the building-received toradol injection and sent to the ED

## 2014-02-27 NOTE — Discharge Instructions (Signed)
CONTINUE TO TAKE THE MEDICATIONS AS DIRECTED FOR MUSCLE RELAXANT AND PAIN. FOLLOW UP WITH YOUR DOCTOR. RETURN HERE AS NEEDED.

## 2014-02-27 NOTE — Patient Instructions (Addendum)
I want you to get a ct of your head stat now in radiology. Stay there until I go over results and see how your are doing. There is a chance if your ha worsens or symptoms change despite toradol im then will advise to go to ED. Continue flexeril and try imitrex as I rx'd. Update me tomorrow on your level of HA. If pain not diminished significantly or resolved will likely refer to neurologist.   Also if you get ha with stiff neck that would be ED evalution.  Follow up in 7 days or as needed.   The above was my plan before she had no improvement whatsoever of headache with the Toradol. So I did send her to the emergency department.

## 2014-02-27 NOTE — Progress Notes (Signed)
   Subjective:    Patient ID: Lorraine Conrad, female    DOB: 1971/03/11, 43 y.o.   MRN: [id number]  HPI  Pt states saw Dr. Birdie Riddle on this past Thursday. Pt got toradol im injection on Thursday. By Friday, she did not get better. Ha returned in early am on Friday. She called here and our office told her to go to the emergency department. Pt went to ED and no imaging of her head was done. Only given medications. She was diagnosed with tension type ha.Pt was given some decadron and morphine in the ED. Also they gave her some toradol. Pt was told possible migraine. Pt used some butalbital over the weekend and flexeril. She states it did not seem to help much. Pt states HA intensity started to progressively get worse on Sunday.  Pt states sound senstitive  over weekend did bother her ha and light senstitive ha.  Pt states history of HA when about 17 yrs ago. But now ha returns and lasting a lot longer. And stronger.   Hysterectomy in November.   Level 9 ha.      Review of Systems     Objective:   Physical Exam  General Mental Status- Alert. General Appearance- Not in acute distress.   Skin General: Color- Normal Color. Moisture- Normal Moisture.  Neck Carotid Arteries- Normal color. Moisture- Normal Moisture. No carotid bruits. No JVD. Pt posterior neck. Trapezius where inserts into occipital region is tender.  Chest and Lung Exam Auscultation: Breath Sounds:-Normal.  Cardiovascular Auscultation:Rythm- Regular. Murmurs & Other Heart Sounds:Auscultation of the heart reveals- No Murmurs.  Abdomen Inspection:-Inspeection Normal. Palpation/Percussion:Note:No mass. Palpation and Percussion of the abdomen reveal- Non Tender, Non Distended + BS, no rebound or guarding.    Neurologic Cranial Nerve exam:- CN III-XII intact(No nystagmus), symmetric smile. Drift Test:- No drift. Romberg Exam:- Negative.  Heal to Toe Gait exam:-Normal. Finger to Nose:- Normal/Intact Strength:- 5/5  equal and symmetric strength both upper and lower extremities.        Assessment & Plan:

## 2014-02-28 ENCOUNTER — Encounter (HOSPITAL_COMMUNITY): Payer: Self-pay | Admitting: Emergency Medicine

## 2014-02-28 ENCOUNTER — Telehealth: Payer: Self-pay

## 2014-02-28 DIAGNOSIS — G43901 Migraine, unspecified, not intractable, with status migrainosus: Secondary | ICD-10-CM | POA: Diagnosis present

## 2014-02-28 DIAGNOSIS — K219 Gastro-esophageal reflux disease without esophagitis: Secondary | ICD-10-CM | POA: Diagnosis present

## 2014-02-28 DIAGNOSIS — R51 Headache: Secondary | ICD-10-CM | POA: Diagnosis not present

## 2014-02-28 DIAGNOSIS — D72829 Elevated white blood cell count, unspecified: Secondary | ICD-10-CM | POA: Diagnosis present

## 2014-02-28 DIAGNOSIS — D473 Essential (hemorrhagic) thrombocythemia: Secondary | ICD-10-CM | POA: Diagnosis present

## 2014-02-28 DIAGNOSIS — A879 Viral meningitis, unspecified: Principal | ICD-10-CM | POA: Diagnosis present

## 2014-02-28 DIAGNOSIS — G4733 Obstructive sleep apnea (adult) (pediatric): Secondary | ICD-10-CM | POA: Diagnosis present

## 2014-02-28 DIAGNOSIS — Z79899 Other long term (current) drug therapy: Secondary | ICD-10-CM

## 2014-02-28 MED ORDER — FENTANYL CITRATE 0.05 MG/ML IJ SOLN
50.0000 ug | Freq: Once | INTRAMUSCULAR | Status: AC
  Administered 2014-02-28: 50 ug via NASAL
  Filled 2014-02-28: qty 2

## 2014-02-28 MED ORDER — ONDANSETRON 4 MG PO TBDP
8.0000 mg | ORAL_TABLET | Freq: Once | ORAL | Status: AC
  Administered 2014-02-28: 8 mg via ORAL
  Filled 2014-02-28: qty 2

## 2014-02-28 MED ORDER — ONDANSETRON 4 MG PO TBDP: 4.0000 mg | ORAL_TABLET | ORAL | Status: DC | PRN

## 2014-02-28 NOTE — Telephone Encounter (Signed)
Lorraine Conrad [phone] [facility]  Lorraine Conrad has called back about Lorraine Conrad, she is throwing up about every hour since last night when they returned from ED, can we call some Zofram in for her.

## 2014-02-28 NOTE — Telephone Encounter (Signed)
Lakira Ogando Spouse [phone]  Corene Cornea called to say that Zoeie had been up all night throwing up from injection she received in emergency room. He was wondering is there any way we could call in some Zofran, she is allergic to [employer]

## 2014-02-28 NOTE — ED Notes (Signed)
Pt. reports persistent migraine headache with emesis and photophobia for 1 1/2 weeks , seen at [facility] yesterday - CT head done and received Morphine and Reglan . Denies fever or chills. No head injury.  Alert and oriented /respirations unlabored .

## 2014-02-28 NOTE — Telephone Encounter (Signed)
Please advise 

## 2014-02-28 NOTE — Addendum Note (Signed)
Addended by: Debbrah Alar on: 02/28/2014 02:01 PM   Modules accepted: Orders

## 2014-02-28 NOTE — Telephone Encounter (Signed)
Handled under separate encounter.

## 2014-02-28 NOTE — Telephone Encounter (Signed)
Medication ordered by Debbrah Alar. Spouse notified.

## 2014-02-28 NOTE — Telephone Encounter (Signed)
Caller name: Kandler,Jason Relation to pt: spouse  Call back number:[phone]   Reason for call: please call pt spouse mobile regarding wife condition.

## 2014-03-01 ENCOUNTER — Inpatient Hospital Stay (HOSPITAL_COMMUNITY)
Admission: EM | Admit: 2014-03-01 | Discharge: 2014-03-04 | DRG: 076 | Disposition: A | Discharge status: Home / Self Care | Payer: BC Managed Care – PPO | Attending: Internal Medicine | Admitting: Internal Medicine

## 2014-03-01 ENCOUNTER — Encounter (HOSPITAL_COMMUNITY): Payer: Self-pay | Admitting: General Practice

## 2014-03-01 ENCOUNTER — Inpatient Hospital Stay (HOSPITAL_COMMUNITY): Payer: BC Managed Care – PPO

## 2014-03-01 DIAGNOSIS — G43911 Migraine, unspecified, intractable, with status migrainosus: Secondary | ICD-10-CM

## 2014-03-01 DIAGNOSIS — K219 Gastro-esophageal reflux disease without esophagitis: Secondary | ICD-10-CM | POA: Diagnosis present

## 2014-03-01 DIAGNOSIS — G43901 Migraine, unspecified, not intractable, with status migrainosus: Secondary | ICD-10-CM | POA: Insufficient documentation

## 2014-03-01 DIAGNOSIS — R51 Headache: Secondary | ICD-10-CM | POA: Diagnosis present

## 2014-03-01 DIAGNOSIS — K2100 Gastro-esophageal reflux disease with esophagitis, without bleeding: Secondary | ICD-10-CM

## 2014-03-01 DIAGNOSIS — G4733 Obstructive sleep apnea (adult) (pediatric): Secondary | ICD-10-CM | POA: Diagnosis present

## 2014-03-01 DIAGNOSIS — R519 Headache, unspecified: Secondary | ICD-10-CM | POA: Diagnosis present

## 2014-03-01 DIAGNOSIS — D72829 Elevated white blood cell count, unspecified: Secondary | ICD-10-CM | POA: Diagnosis present

## 2014-03-01 DIAGNOSIS — A879 Viral meningitis, unspecified: Secondary | ICD-10-CM

## 2014-03-01 DIAGNOSIS — D473 Essential (hemorrhagic) thrombocythemia: Secondary | ICD-10-CM | POA: Diagnosis present

## 2014-03-01 DIAGNOSIS — Z79899 Other long term (current) drug therapy: Secondary | ICD-10-CM | POA: Diagnosis not present

## 2014-03-01 DIAGNOSIS — K589 Irritable bowel syndrome without diarrhea: Secondary | ICD-10-CM

## 2014-03-01 HISTORY — DX: Headache: R51

## 2014-03-01 HISTORY — DX: Low back pain, unspecified: M54.50

## 2014-03-01 HISTORY — DX: Obstructive sleep apnea (adult) (pediatric): G47.33

## 2014-03-01 HISTORY — DX: Other chronic pain: G89.29

## 2014-03-01 HISTORY — DX: Migraine, unspecified, not intractable, without status migrainosus: G43.909

## 2014-03-01 HISTORY — DX: Low back pain: M54.5

## 2014-03-01 HISTORY — DX: Dependence on other enabling machines and devices: Z99.89

## 2014-03-01 HISTORY — DX: Unspecified osteoarthritis, unspecified site: M19.90

## 2014-03-01 HISTORY — DX: Headache, unspecified: R51.9

## 2014-03-01 LAB — CSF CELL COUNT WITH DIFFERENTIAL
EOS CSF: 0 % (ref 0–1)
LYMPHS CSF: 93 % — AB (ref 40–80)
Monocyte-Macrophage-Spinal Fluid: 6 % — ABNORMAL LOW (ref 15–45)
RBC Count, CSF: 6 /mm3 — ABNORMAL HIGH
Segmented Neutrophils-CSF: 1 % (ref 0–6)
TUBE #: 4
WBC, CSF: 610 /mm3 (ref 0–5)

## 2014-03-01 LAB — COMPREHENSIVE METABOLIC PANEL
ALK PHOS: 74 U/L (ref 39–117)
ALT: 7 U/L (ref 0–35)
AST: 10 U/L (ref 0–37)
Albumin: 3.5 g/dL (ref 3.5–5.2)
Anion gap: 13 (ref 5–15)
BUN: 9 mg/dL (ref 6–23)
CO2: 24 meq/L (ref 19–32)
Calcium: 8.6 mg/dL (ref 8.4–10.5)
Chloride: 100 mEq/L (ref 96–112)
Creatinine, Ser: 0.72 mg/dL (ref 0.50–1.10)
Glucose, Bld: 101 mg/dL — ABNORMAL HIGH (ref 70–99)
POTASSIUM: 3.8 meq/L (ref 3.7–5.3)
SODIUM: 137 meq/L (ref 137–147)
Total Bilirubin: 0.3 mg/dL (ref 0.3–1.2)
Total Protein: 7.2 g/dL (ref 6.0–8.3)

## 2014-03-01 LAB — URINALYSIS, ROUTINE W REFLEX MICROSCOPIC
Bilirubin Urine: NEGATIVE
Glucose, UA: NEGATIVE mg/dL
Ketones, ur: >80 mg/dL — AB
Leukocytes, UA: NEGATIVE
Nitrite: NEGATIVE
PH: 6 (ref 5.0–8.0)
Protein, ur: NEGATIVE mg/dL
SPECIFIC GRAVITY, URINE: 1.016 (ref 1.005–1.030)
Urobilinogen, UA: 0.2 mg/dL (ref 0.0–1.0)

## 2014-03-01 LAB — CBC WITH DIFFERENTIAL/PLATELET
Basophils Absolute: 0 10*3/uL (ref 0.0–0.1)
Basophils Relative: 0 % (ref 0–1)
Eosinophils Absolute: 0 10*3/uL (ref 0.0–0.7)
Eosinophils Relative: 0 % (ref 0–5)
HCT: 37.2 % (ref 36.0–46.0)
Hemoglobin: 12.6 g/dL (ref 12.0–15.0)
LYMPHS PCT: 8 % — AB (ref 12–46)
Lymphs Abs: 1.3 10*3/uL (ref 0.7–4.0)
MCH: 28.7 pg (ref 26.0–34.0)
MCHC: 33.9 g/dL (ref 30.0–36.0)
MCV: 84.7 fL (ref 78.0–100.0)
Monocytes Absolute: 0.7 10*3/uL (ref 0.1–1.0)
Monocytes Relative: 4 % (ref 3–12)
NEUTROS PCT: 88 % — AB (ref 43–77)
Neutro Abs: 14.2 10*3/uL — ABNORMAL HIGH (ref 1.7–7.7)
PLATELETS: 414 10*3/uL — AB (ref 150–400)
RBC: 4.39 MIL/uL (ref 3.87–5.11)
RDW: 13.5 % (ref 11.5–15.5)
WBC: 16.3 10*3/uL — ABNORMAL HIGH (ref 4.0–10.5)

## 2014-03-01 LAB — PROTEIN AND GLUCOSE, CSF
Glucose, CSF: 53 mg/dL (ref 43–76)
Total  Protein, CSF: 135 mg/dL — ABNORMAL HIGH (ref 15–45)

## 2014-03-01 LAB — HIV ANTIBODY (ROUTINE TESTING W REFLEX): HIV: NONREACTIVE

## 2014-03-01 LAB — GRAM STAIN

## 2014-03-01 LAB — PROTIME-INR
INR: 1.07 (ref 0.00–1.49)
Prothrombin Time: 13.9 seconds (ref 11.6–15.2)

## 2014-03-01 LAB — LACTIC ACID, PLASMA: Lactic Acid, Venous: 1.1 mmol/L (ref 0.5–2.2)

## 2014-03-01 LAB — LIPASE, BLOOD: LIPASE: 17 U/L (ref 11–59)

## 2014-03-01 LAB — PREGNANCY, URINE: Preg Test, Ur: NEGATIVE

## 2014-03-01 LAB — URINE MICROSCOPIC-ADD ON

## 2014-03-01 LAB — RPR

## 2014-03-01 MED ORDER — HEPARIN SODIUM (PORCINE) 5000 UNIT/ML IJ SOLN
5000.0000 [IU] | Freq: Three times a day (TID) | INTRAMUSCULAR | Status: DC
  Administered 2014-03-01 – 2014-03-04 (×9): 5000 [IU] via SUBCUTANEOUS
  Filled 2014-03-01 (×14): qty 1

## 2014-03-01 MED ORDER — DEXTROSE 5 % IV SOLN
2.0000 g | Freq: Once | INTRAVENOUS | Status: AC
  Administered 2014-03-01: 2 g via INTRAVENOUS
  Filled 2014-03-01: qty 2

## 2014-03-01 MED ORDER — METOCLOPRAMIDE HCL 5 MG/ML IJ SOLN
10.0000 mg | Freq: Once | INTRAMUSCULAR | Status: AC
  Administered 2014-03-01: 10 mg via INTRAVENOUS
  Filled 2014-03-01: qty 2

## 2014-03-01 MED ORDER — DEXAMETHASONE SODIUM PHOSPHATE 10 MG/ML IJ SOLN
10.0000 mg | Freq: Four times a day (QID) | INTRAMUSCULAR | Status: DC
  Administered 2014-03-01 (×2): 10 mg via INTRAVENOUS
  Filled 2014-03-01 (×5): qty 1

## 2014-03-01 MED ORDER — KETOROLAC TROMETHAMINE 30 MG/ML IJ SOLN
30.0000 mg | Freq: Once | INTRAMUSCULAR | Status: AC
  Administered 2014-03-01: 30 mg via INTRAVENOUS
  Filled 2014-03-01: qty 1

## 2014-03-01 MED ORDER — SODIUM CHLORIDE 0.9 % IV BOLUS (SEPSIS)
1000.0000 mL | Freq: Once | INTRAVENOUS | Status: AC
  Administered 2014-03-01: 1000 mL via INTRAVENOUS

## 2014-03-01 MED ORDER — MORPHINE SULFATE 2 MG/ML IJ SOLN
1.0000 mg | INTRAMUSCULAR | Status: DC | PRN
  Administered 2014-03-01 – 2014-03-02 (×5): 1 mg via INTRAVENOUS
  Filled 2014-03-01 (×5): qty 1

## 2014-03-01 MED ORDER — LIDOCAINE-EPINEPHRINE 1 %-1:100000 IJ SOLN
10.0000 mL | Freq: Once | INTRAMUSCULAR | Status: AC
  Administered 2014-03-01: 10 mL
  Filled 2014-03-01: qty 1

## 2014-03-01 MED ORDER — PANTOPRAZOLE SODIUM 40 MG PO TBEC
40.0000 mg | DELAYED_RELEASE_TABLET | Freq: Every day | ORAL | Status: DC
  Administered 2014-03-01 – 2014-03-04 (×4): 40 mg via ORAL
  Filled 2014-03-01 (×4): qty 1

## 2014-03-01 MED ORDER — DIPHENHYDRAMINE HCL 50 MG/ML IJ SOLN
25.0000 mg | Freq: Once | INTRAMUSCULAR | Status: AC
  Administered 2014-03-01: 25 mg via INTRAVENOUS
  Filled 2014-03-01: qty 1

## 2014-03-01 MED ORDER — ACYCLOVIR SODIUM 50 MG/ML IV SOLN
550.0000 mg | Freq: Three times a day (TID) | INTRAVENOUS | Status: DC
  Administered 2014-03-01 – 2014-03-02 (×4): 550 mg via INTRAVENOUS
  Filled 2014-03-01 (×8): qty 11

## 2014-03-01 MED ORDER — DEXAMETHASONE SODIUM PHOSPHATE 4 MG/ML IJ SOLN
10.0000 mg | Freq: Four times a day (QID) | INTRAMUSCULAR | Status: DC
  Administered 2014-03-01 – 2014-03-03 (×7): 10 mg via INTRAVENOUS
  Filled 2014-03-01 (×11): qty 2.5

## 2014-03-01 MED ORDER — CYCLOBENZAPRINE HCL 10 MG PO TABS
10.0000 mg | ORAL_TABLET | Freq: Three times a day (TID) | ORAL | Status: DC | PRN
  Administered 2014-03-01 (×2): 10 mg via ORAL
  Filled 2014-03-01 (×3): qty 1

## 2014-03-01 MED ORDER — SODIUM CHLORIDE 0.9 % IV SOLN: 250.0000 mL | INTRAVENOUS | Status: DC | PRN

## 2014-03-01 MED ORDER — SODIUM CHLORIDE 0.9 % IJ SOLN: 3.0000 mL | INTRAMUSCULAR | Status: DC | PRN

## 2014-03-01 MED ORDER — SODIUM CHLORIDE 0.9 % IV SOLN
INTRAVENOUS | Status: DC
  Administered 2014-03-01: 08:00:00 via INTRAVENOUS
  Administered 2014-03-01: 1000 mL via INTRAVENOUS
  Administered 2014-03-03 (×3): via INTRAVENOUS

## 2014-03-01 MED ORDER — DEXTROSE 5 % IV SOLN
2.0000 g | Freq: Two times a day (BID) | INTRAVENOUS | Status: DC
  Administered 2014-03-01 – 2014-03-03 (×4): 2 g via INTRAVENOUS
  Filled 2014-03-01 (×5): qty 2

## 2014-03-01 MED ORDER — VANCOMYCIN HCL 10 G IV SOLR
2000.0000 mg | Freq: Once | INTRAVENOUS | Status: AC
  Administered 2014-03-01: 2000 mg via INTRAVENOUS
  Filled 2014-03-01: qty 2000

## 2014-03-01 MED ORDER — SODIUM CHLORIDE 0.9 % IJ SOLN
3.0000 mL | Freq: Two times a day (BID) | INTRAMUSCULAR | Status: DC
  Administered 2014-03-03 – 2014-03-04 (×2): 3 mL via INTRAVENOUS

## 2014-03-01 MED ORDER — ONDANSETRON HCL 4 MG PO TABS: 4.0000 mg | ORAL_TABLET | Freq: Four times a day (QID) | ORAL | Status: DC | PRN

## 2014-03-01 MED ORDER — VANCOMYCIN HCL IN DEXTROSE 1-5 GM/200ML-% IV SOLN
1000.0000 mg | Freq: Three times a day (TID) | INTRAVENOUS | Status: DC
  Administered 2014-03-01 – 2014-03-03 (×5): 1000 mg via INTRAVENOUS
  Filled 2014-03-01 (×7): qty 200

## 2014-03-01 MED ORDER — ONDANSETRON HCL 4 MG/2ML IJ SOLN
4.0000 mg | Freq: Four times a day (QID) | INTRAMUSCULAR | Status: DC | PRN
  Administered 2014-03-01 – 2014-03-03 (×3): 4 mg via INTRAVENOUS
  Filled 2014-03-01 (×3): qty 2

## 2014-03-01 NOTE — Progress Notes (Signed)
Patient trasfered from ED to [phone] via stretcher; alert and oriented x 4;  complaints of headache; IV in RAC running NS@100cc /hr; skin intact. Orient patient to room and unit; watch safety video; gave patient care guide; instructed how to use the call bell and  fall risk precautions. Will continue to monitor the patient.

## 2014-03-01 NOTE — ED Notes (Signed)
Patient transported to X-ray 

## 2014-03-01 NOTE — Consult Note (Signed)
NEURO HOSPITALIST CONSULT NOTE    Reason for Consult: HA and possible meningitis  HPI:                                                                                                                                          Lorraine Conrad is an 43 y.o. female who has history of HA in past but states she has never been diagnosed with migraine.  Her past HA usually lasted one day and required her to go into a dark room and sleep for about one night. She states about ten days ago she started to have a HA.  Last Friday she noted the HA was worsening and she had sensitivity to light and a constant HA in the back of her head and in her neck.  She went to urgent care where she received (Toradol, Reglan, Benadryl, Morphine and Decadron)  which relieved her HA for a short period of time but she was again woken by the HA later that night. Over the weekend she used some Butalbital and Flexeril with some relief but states it did not seem to help much.  Due to not having relief, she went to her PCP and again received a migraine cocktail with no relief.  She was to see a neurologist but due to HA not improving she came to ED.  Currently she is stating she has a 6/10 HA which is constant and located in the back of her head along with posterior neck discomfort which is bilateral.  While in ED labs were drawn showing elevated WBC 16.3, 88% neutrophils, 8 lymphocytes. Due to concern for meningitis patient has been placed on Acyclovir, Ceftriaxone and Vancomycin. LP was attempted in ED but unsuccessful and patient will be going to IR for LP.   Past Medical History  Diagnosis Date  . GERD (gastroesophageal reflux disease)   . Headache(784.0)   . Endometriosis   . Ovarian cyst, left   . External hemorrhoids   . Erosive gastritis   . Proctalgia fugax   . Allergy     SEASONAL  . Anemia   . Anxiety     PT. DENIES    Past Surgical History  Procedure Laterality Date  . Cesarean section      x  2  . Temporomandibular joint surgery    . Sinus surgery with instatrak    . Laparoscopic ovarian cystectomy    . Upper gastrointestinal endoscopy    . Colonoscopy    . Abdominal hysterectomy      Family History  Problem Relation Age of Onset  . Breast cancer Maternal Grandmother   . Colon cancer Neg Hx   . Prostate cancer Paternal Grandfather   . Colon polyps Father   . Heart disease Father  Social History:  reports that she has never smoked. She has never used smokeless tobacco. She reports that she drinks alcohol. She reports that she does not use illicit drugs.  Allergies  Allergen Reactions  . Promethazine Hcl Other (See Comments)    Violent tremors   . Codeine Other (See Comments)    Was a child when she took this.  . Dilaudid [Hydromorphone] Nausea And Vomiting  . Azithromycin Rash    Head to toe rash    MEDICATIONS:                                                                                                                     Current Facility-Administered Medications  Medication Dose Route Frequency Provider Last Rate Last Dose  . 0.9 %  sodium chloride infusion   Intravenous Continuous Domenic Polite, MD 125 mL/hr at 03/01/14 [phone]    . 0.9 %  sodium chloride infusion  250 mL Intravenous PRN Ivor Costa, MD      . acyclovir (ZOVIRAX) 550 mg in dextrose 5 % 100 mL IVPB  550 mg Intravenous Q8H Domenic Polite, MD 111 mL/hr at 03/01/14 1140 550 mg at 03/01/14 1140  . cefTRIAXone (ROCEPHIN) 2 g in dextrose 5 % 50 mL IVPB  2 g Intravenous Q12H Domenic Polite, MD      . cyclobenzaprine (FLEXERIL) tablet 10 mg  10 mg Oral TID PRN Ivor Costa, MD   10 mg at 03/01/14 1006  . dexamethasone (DECADRON) injection 10 mg  10 mg Intravenous 4 times per day Ivor Costa, MD   10 mg at 03/01/14 1138  . heparin injection 5,000 Units  5,000 Units Subcutaneous 3 times per day Ivor Costa, MD   5,000 Units at 03/01/14 [phone]  . morphine 2 MG/ML injection 1 mg  1 mg Intravenous Q4H PRN Domenic Polite, MD      . ondansetron [facility]) tablet 4 mg  4 mg Oral Q6H PRN Ivor Costa, MD       Or  . ondansetron [facility]) injection 4 mg  4 mg Intravenous Q6H PRN Ivor Costa, MD      . pantoprazole (PROTONIX) EC tablet 40 mg  40 mg Oral Daily Ivor Costa, MD   40 mg at 03/01/14 1012  . sodium chloride 0.9 % injection 3 mL  3 mL Intravenous Q12H Ivor Costa, MD      . sodium chloride 0.9 % injection 3 mL  3 mL Intravenous PRN Ivor Costa, MD      . vancomycin (VANCOCIN) IVPB 1000 mg/200 mL premix  1,000 mg Intravenous Q8H Domenic Polite, MD       Current Outpatient Prescriptions  Medication Sig Dispense Refill  . acetaminophen (TYLENOL) 500 MG tablet Take 1,000 mg by mouth 2 (two) times daily as needed for pain. For pain      . butalbital-acetaminophen-caffeine (FIORICET) 50-325-40 MG per tablet Take 1-2 tablets by mouth every 6 (six) hours as needed for headache.  20 tablet  0  .  cyclobenzaprine (FLEXERIL) 10 MG tablet Take 1 tablet (10 mg total) by mouth 3 (three) times daily as needed for muscle spasms.  45 tablet  0  . ibuprofen (ADVIL,MOTRIN) 200 MG tablet Take 400 mg by mouth every 6 (six) hours as needed for pain. For pain      . lansoprazole (PREVACID) 30 MG capsule Take 30 mg by mouth daily at 12 noon.      . naproxen (NAPROSYN) 500 MG tablet Take 1 tablet (500 mg total) by mouth 2 (two) times daily with a meal.  60 tablet  0  . ondansetron (ZOFRAN-ODT) 4 MG disintegrating tablet Take 4 mg by mouth every 8 (eight) hours as needed for nausea or vomiting.      . SUMAtriptan (IMITREX) 50 MG tablet Take 1 tablet (50 mg total) by mouth every 2 (two) hours as needed for migraine or headache. May repeat in 2 hours if headache persists or recurs. Max dose is 2 tablets within 24 hour period.  10 tablet  0      ROS:                                                                                                                                       History obtained from the patient  General ROS: negative  for - chills, fatigue, fever, night sweats, weight gain or weight loss Psychological ROS: negative for - behavioral disorder, hallucinations, memory difficulties, mood swings or suicidal ideation Ophthalmic ROS: negative for - blurry vision, double vision, eye pain or loss of vision ENT ROS: negative for - epistaxis, nasal discharge, oral lesions, sore throat, tinnitus or vertigo Allergy and Immunology ROS: negative for - hives or itchy/watery eyes Hematological and Lymphatic ROS: negative for - bleeding problems, bruising or swollen lymph nodes Endocrine ROS: negative for - galactorrhea, hair pattern changes, polydipsia/polyuria or temperature intolerance Respiratory ROS: negative for - cough, hemoptysis, shortness of breath or wheezing Cardiovascular ROS: negative for - chest pain, dyspnea on exertion, edema or irregular heartbeat Gastrointestinal ROS: negative for - abdominal pain, diarrhea, hematemesis, nausea/vomiting or stool incontinence Genito-Urinary ROS: negative for - dysuria, hematuria, incontinence or urinary frequency/urgency Musculoskeletal ROS: negative for - joint swelling or muscular weakness Neurological ROS: as noted in HPI Dermatological ROS: negative for rash and skin lesion changes   Blood pressure 94/47, pulse 72, temperature 98.7 F (37.1 C), temperature source Oral, resp. rate 22, height 5\' 4"  (1.626 m), weight 96.163 kg (212 lb), last menstrual period 02/08/2013, SpO2 95.00%.   Neurologic Examination:  General: NAD Mental Status: Alert, oriented, thought content appropriate.  Speech fluent without evidence of aphasia.  Able to follow 3 step commands without difficulty. Cranial Nerves: II: Discs flat bilaterally; Visual fields grossly normal, pupils equal, round, reactive to light and accommodation III,IV, VI: ptosis not present, extra-ocular motions intact  bilaterally V,VII: smile symmetric, facial light touch sensation normal bilaterally VIII: hearing normal bilaterally IX,X: gag reflex present XI: bilateral shoulder shrug XII: midline tongue extension without atrophy or fasciculations  Motor: Right : Upper extremity   5/5    Left:     Upper extremity   5/5  Lower extremity   5/5     Lower extremity   5/5 Tone and bulk:normal tone throughout; no atrophy noted Sensory: Pinprick and light touch intact throughout, bilaterally Deep Tendon Reflexes:  Right: Upper Extremity   Left: Upper extremity   biceps (C-5 to C-6) 2/4   biceps (C-5 to C-6) 2/4 tricep (C7) 2/4    triceps (C7) 2/4 Brachioradialis (C6) 2/4  Brachioradialis (C6) 2/4  Lower Extremity Lower Extremity  quadriceps (L-2 to L-4) 2/4   quadriceps (L-2 to L-4) 2/4 Achilles (S1) 2/4   Achilles (S1) 2/4  Plantars: Right: downgoing   Left: downgoing Cerebellar: normal finger-to-nose,  normal heel-to-shin test Gait: not tested CV: pulses palpable throughout    Lab Results: Basic Metabolic Panel:  Recent Labs Lab 02/24/14 1915 02/27/14 1815 03/01/14 0130  NA 139 138 137  K 3.8 4.1 3.8  CL 101 100 100  CO2 25 27 24   GLUCOSE 96 89 101*  BUN 6 8 9   CREATININE 0.90 0.90 0.72  CALCIUM 9.1 8.9 8.6    Liver Function Tests:  Recent Labs Lab 03/01/14 0130  AST 10  ALT 7  ALKPHOS 74  BILITOT 0.3  PROT 7.2  ALBUMIN 3.5    Recent Labs Lab 03/01/14 0130  LIPASE 17   No results found for this basename: AMMONIA,  in the last 168 hours  CBC:  Recent Labs Lab 02/23/14 1359 02/24/14 1915 02/27/14 1815 03/01/14 0130  WBC 10.8* 10.2 10.7* 16.3*  NEUTROABS 8.5* 6.7 7.3 14.2*  HGB 13.1 13.2 12.4 12.6  HCT 39.7 40.5 38.1 37.2  MCV 85.7 88.2 88.2 84.7  PLT 429.0* 428* 390 414*    Cardiac Enzymes: No results found for this basename: CKTOTAL, CKMB, CKMBINDEX, TROPONINI,  in the last 168 hours  Lipid Panel: No results found for this basename: CHOL, TRIG,  HDL, CHOLHDL, VLDL, LDLCALC,  in the last 168 hours  CBG: No results found for this basename: GLUCAP,  in the last 168 hours  Microbiology: Results for orders placed in visit on 02/23/14  URINE CULTURE     Status: None   Collection Time    02/23/14  2:00 PM      Result Value Ref Range Status   Colony Count 9,000 COLONIES/ML   Final   Organism ID, Bacteria Insignificant Growth   Final    Coagulation Studies:  Recent Labs  03/01/14 0130  LABPROT 13.9  INR 1.07    Imaging: Ct Head Wo Contrast  02/27/2014   CLINICAL DATA:  Headaches.  EXAM: CT HEAD WITHOUT CONTRAST  TECHNIQUE: Contiguous axial images were obtained from the base of the skull through the vertex without intravenous contrast.  COMPARISON:  10/09/2012.  FINDINGS: The ventricles are normal in size and configuration. No extra-axial fluid collections are identified. The gray-white differentiation is normal. No CT findings for acute intracranial process such as hemorrhage or  infarction. No mass lesions. The brainstem and cerebellum are grossly normal.  The bony structures are intact. The paranasal sinuses and mastoid air cells are clear. The globes are intact.  IMPRESSION: No acute intracranial findings or mass lesions to account for the patient's headaches.   Electronically Signed   By: Kalman Jewels M.D.   On: 02/27/2014 16:08    Etta Quill PA-C [facility] [phone]  03/01/2014, 1:48 PM  Patient seen and examined.  Clinical course and management discussed.  Necessary edits performed.  I agree with the above.  Assessment and plan of care developed and discussed below.     Assessment/Plan:  43 YO female with a ten day history of headache with associated posterior neck discomfort. She has been treated symptomatically at multiple facilities with no significant relief. Outpatient head CT performed on 02/27/14 reviewed and shows no abnormalities.  Neurological examination is non focal.  Suspect status  migrainosus.  Infection less likely with patient's length of symptoms and less than malignant presentation.    Recommendations: 1.  MRI of the brain with and without contrast 2.  Depacon 500mg  q 8hrs tro 15 minutes 3.  Continue Flexeril 4.  Will follow up results of LP 5.  Consider discontinuation of antibiotics and antiviral therapy   Alexis Goodell, MD [facility] [phone]  03/01/2014  2:52 PM

## 2014-03-01 NOTE — Progress Notes (Signed)
Pt seen and examined admitted this am per Dr.Niu with Severe headache, photophobia, neck stiffness, low grade fevers and chills with some nausea and vomiting. Head CT 9/21 unremarkable Mild leukocytosis and thrombocytosis noted on labs Will request LP per IR under fluro, attempted per EDP unsuccessful Continue empiric Abx for now Neuro consulted, d/w dr.Reynolds Check RMSF titres, Lymes serology, ehrlichia panel Supportive care  Domenic Polite, MD [phone]

## 2014-03-01 NOTE — Progress Notes (Signed)
CRITICAL VALUE ALERT  Critical value received:  WBC spinal fluid 610  Date of notification:  03/01/2014  Time of notification:  16:34  Critical value read back:Yes.    Nurse who received alert:  Alphonsus Sias  MD notified (1st page):  Fanny Bien  Time of first page:  16:35  MD notified (2nd page):  Time of second page:  Responding MD:  Continue ABx  Time MD responded:  16:40

## 2014-03-01 NOTE — ED Notes (Signed)
Pt doesn't want morphine at this time. Made aware available if needed.

## 2014-03-01 NOTE — H&P (Addendum)
[facility] History and Physical  Gwenna Fuston RXV:[id number] DOB: [birth year] DOA: 03/01/2014  Referring physician: ED physician PCP: Annye Asa, MD  Specialists:   Chief Complaint: Severe headache   HPI: Lorraine Conrad is a 43 y.o. female with past medical history of GERD, IBS, migraine headaches, who presents with worsening headache.  Patient reports that she has migraine headache which was moderate usually. In the past 10 days, she has been having severe headache which is much worse than her usual migraine headache. Headache is constant, 10 out of 10 in severity, pressure-like, radiating to the back of her neck. It is not aggravated or alleviated with any known factors. She also has nausea and frequent vomiting. She has photophobia, fever and chills. Denies abdominal pain or diarrhea. No rashes. Patient does not have shortness of breath, cough, runny nose, sore throat, chest pain.   She was seen by her PCP on Monday and treated with Toradol injection after she had negative CT head. She was given referral to neurology, but not seen yet. Because of worsening headache, patient comes to the emergency room for further evaluation. Patient was found to have leukocytosis with WBC 16.3. Physical examination is consistent with meningitis. IV antibiotics were started in ED. Patient is admitted to inpatient for further evaluation and treatment.   Review of Systems: As presented in the history of presenting illness, rest negative.  Where does patient live?  Lives with her husband at home Can patient participate in ADLs? Yes  Allergy:  Allergies  Allergen Reactions  . Promethazine Hcl Other (See Comments)    Violent tremors   . Codeine Other (See Comments)    Was a child when she took this.  . Dilaudid [Hydromorphone] Nausea And Vomiting  . Azithromycin Rash    Head to toe rash    Past Medical History  Diagnosis Date  . GERD (gastroesophageal reflux disease)   . Headache(784.0)    . Endometriosis   . Ovarian cyst, left   . External hemorrhoids   . Erosive gastritis   . Proctalgia fugax   . Allergy     SEASONAL  . Anemia   . Anxiety     PT. DENIES    Past Surgical History  Procedure Laterality Date  . Cesarean section      x 2  . Temporomandibular joint surgery    . Sinus surgery with instatrak    . Laparoscopic ovarian cystectomy    . Upper gastrointestinal endoscopy    . Colonoscopy    . Abdominal hysterectomy      Social History:  reports that she has never smoked. She has never used smokeless tobacco. She reports that she drinks alcohol. She reports that she does not use illicit drugs.  Family History:  Family History  Problem Relation Age of Onset  . Breast cancer Maternal Grandmother   . Colon cancer Neg Hx   . Prostate cancer Paternal Grandfather   . Colon polyps Father   . Heart disease Father      Prior to Admission medications   Medication Sig Start Date End Date Taking? Authorizing Provider  acetaminophen (TYLENOL) 500 MG tablet Take 1,000 mg by mouth 2 (two) times daily as needed for pain. For pain   Yes Historical Provider, MD  butalbital-acetaminophen-caffeine (FIORICET) 50-325-40 MG per tablet Take 1-2 tablets by mouth every 6 (six) hours as needed for headache. 02/24/14 02/24/15 Yes Shari A Upstill, PA-C  cyclobenzaprine (FLEXERIL) 10 MG tablet Take 1 tablet (10 mg total)  by mouth 3 (three) times daily as needed for muscle spasms. 02/23/14  Yes Midge Minium, MD  ibuprofen (ADVIL,MOTRIN) 200 MG tablet Take 400 mg by mouth every 6 (six) hours as needed for pain. For pain   Yes Historical Provider, MD  lansoprazole (PREVACID) 30 MG capsule Take 30 mg by mouth daily at 12 noon.   Yes Historical Provider, MD  naproxen (NAPROSYN) 500 MG tablet Take 1 tablet (500 mg total) by mouth 2 (two) times daily with a meal. 02/23/14 02/23/15 Yes Midge Minium, MD  ondansetron (ZOFRAN-ODT) 4 MG disintegrating tablet Take 4 mg by mouth every 8  (eight) hours as needed for nausea or vomiting.   Yes Historical Provider, MD  SUMAtriptan (IMITREX) 50 MG tablet Take 1 tablet (50 mg total) by mouth every 2 (two) hours as needed for migraine or headache. May repeat in 2 hours if headache persists or recurs. Max dose is 2 tablets within 24 hour period. 02/27/14   [place], PA-C    Physical Exam: Marcelline Mates:   03/01/14 0545 03/01/14 0600 03/01/14 0606 03/01/14 0615  BP: 100/77 98/55  116/68  Pulse: 89 63  67  Temp:   98 F (36.7 C)   TempSrc:   Oral   Resp: 20 21  24   Height:      Weight:      SpO2: 98% 97%  97%   General: Not in acute distress HEENT:       Eyes: PERRL, EOMI, no scleral icterus       ENT: No discharge from the ears and nose, no pharynx injection, no tonsillar enlargement.        Neck: No JVD, no bruit, no mass felt. Cardiac: S1/S2, RRR, No murmurs, gallops or rubs Pulm: Good air movement bilaterally. Clear to auscultation bilaterally. No rales, wheezing, rhonchi or rubs. Abd: Soft, nondistended, nontender, no rebound pain, no organomegaly, BS present Ext: No edema. 2+DP/PT pulse bilaterally Musculoskeletal: No joint deformities, erythema, or stiffness, ROM full Skin: No rashes.  Neuro: Alert and oriented X3, cranial nerves II-XII grossly intact, muscle strength 5/5 in all extremeties, sensation to light touch intact. Brachial reflex 2+ bilaterally. Knee reflex 1+ bilaterally. Negative Babinski's sign. Positive Brudzinski sign. Psych: Patient is not psychotic, no suicidal or hemocidal ideation.  Labs on Admission:  Basic Metabolic Panel:  Recent Labs Lab 02/24/14 1915 02/27/14 1815 03/01/14 0130  NA 139 138 137  K 3.8 4.1 3.8  CL 101 100 100  CO2 25 27 24   GLUCOSE 96 89 101*  BUN 6 8 9   CREATININE 0.90 0.90 0.72  CALCIUM 9.1 8.9 8.6   Liver Function Tests:  Recent Labs Lab 03/01/14 0130  AST 10  ALT 7  ALKPHOS 74  BILITOT 0.3  PROT 7.2  ALBUMIN 3.5    Recent Labs Lab  03/01/14 0130  LIPASE 17   No results found for this basename: AMMONIA,  in the last 168 hours CBC:  Recent Labs Lab 02/23/14 1359 02/24/14 1915 02/27/14 1815 03/01/14 0130  WBC 10.8* 10.2 10.7* 16.3*  NEUTROABS 8.5* 6.7 7.3 14.2*  HGB 13.1 13.2 12.4 12.6  HCT 39.7 40.5 38.1 37.2  MCV 85.7 88.2 88.2 84.7  PLT 429.0* 428* 390 414*   Cardiac Enzymes: No results found for this basename: CKTOTAL, CKMB, CKMBINDEX, TROPONINI,  in the last 168 hours  BNP (last 3 results) No results found for this basename: PROBNP,  in the last 8760 hours CBG: No results found for  this basename: GLUCAP,  in the last 168 hours  Radiological Exams on Admission: Ct Head Wo Contrast  02/27/2014   CLINICAL DATA:  Headaches.  EXAM: CT HEAD WITHOUT CONTRAST  TECHNIQUE: Contiguous axial images were obtained from the base of the skull through the vertex without intravenous contrast.  COMPARISON:  10/09/2012.  FINDINGS: The ventricles are normal in size and configuration. No extra-axial fluid collections are identified. The gray-white differentiation is normal. No CT findings for acute intracranial process such as hemorrhage or infarction. No mass lesions. The brainstem and cerebellum are grossly normal.  The bony structures are intact. The paranasal sinuses and mastoid air cells are clear. The globes are intact.  IMPRESSION: No acute intracranial findings or mass lesions to account for the patient's headaches.   Electronically Signed   By: Kalman Jewels M.D.   On: 02/27/2014 16:08    EKG: Independently reviewed.   Assessment/Plan Principal Problem:   Headache(784.0) Active Problems:   GERD  1. severe headache: Her severe headache, leukocytosis, fever, plus positive Brudzinski sign on physical examination indicate that patient likely to have meningitis. Dr. Claudine Mouton attempted lumbar puncture without success due to big body habitus. Another differential diagnoses is severe migraine headache given her history of  migraine headache, but her fever and leukocytosis are not consistent with this diagnosis.  - admit to med-surg bed - Empiric IV vancomycin, IV Rocephin, IV acyclovir - IV Dexamethasone (DECADRON) injection 10 mg, q6h - Aggressive IV fluid hydration, - Culture:  blood X 2. - need to consult to  critical care or intervention radiology for lumbar puncture in AM - check HIV, RPR before csf can be obtained. - zofran for nausea  2. GERD: stable - continue PPI  DVT ppx: SQ Heparin  Code Status: Full code Family Communication:  Yes, patient's husband   at bed side Disposition Plan: Admit to inpatient  Ivor Costa [facility] Pager [phone]  If 7PM-7AM, please contact night-coverage [email or web address] Password [email or web address] 03/01/2014, 7:07 AM

## 2014-03-01 NOTE — Progress Notes (Signed)
ANTIBIOTIC CONSULT NOTE - INITIAL  Pharmacy Consult for Vancomycin/Rocephin/Acyclovir Indication:meningitis  Allergies  Allergen Reactions  . Promethazine Hcl Other (See Comments)    Violent tremors   . Codeine Other (See Comments)    Was a child when she took this.  . Dilaudid [Hydromorphone] Nausea And Vomiting  . Azithromycin Rash    Head to toe rash    Patient Measurements: Height: 5\' 4"  (162.6 cm) Weight: 212 lb (96.163 kg) IBW/kg (Calculated) : 54.7 Adjusted Body Weight: 75 kg  Vital Signs: Temp: 98 F (36.7 C) (09/23 0606) Temp src: Oral (09/23 0606) BP: 116/68 mmHg (09/23 0615) Pulse Rate: 67 (09/23 0615) Intake/Output from previous day:   Intake/Output from this shift:    Labs:  Recent Labs  02/27/14 1815 03/01/14 0130  WBC 10.7* 16.3*  HGB 12.4 12.6  PLT 390 414*  CREATININE 0.90 0.72   Estimated Creatinine Clearance: 104.2 ml/min (by C-G formula based on Cr of 0.72). No results found for this basename: VANCOTROUGH, Corlis Leak, VANCORANDOM, GENTTROUGH, GENTPEAK, GENTRANDOM, TOBRATROUGH, TOBRAPEAK, TOBRARND, AMIKACINPEAK, AMIKACINTROU, AMIKACIN,  in the last 72 hours   Microbiology: Recent Results (from the past 720 hour(s))  URINE CULTURE     Status: None   Collection Time    02/23/14  2:00 PM      Result Value Ref Range Status   Colony Count 9,000 COLONIES/ML   Final   Organism ID, Bacteria Insignificant Growth   Final    Medical History: Past Medical History  Diagnosis Date  . GERD (gastroesophageal reflux disease)   . Headache(784.0)   . Endometriosis   . Ovarian cyst, left   . External hemorrhoids   . Erosive gastritis   . Proctalgia fugax   . Allergy     SEASONAL  . Anemia   . Anxiety     PT. DENIES    Medications:  APAP  Fioricet  FLexeril  Motrin  Prevacid  Imitrex    Assessment: 43 yo female with fever/headache, possible meningitis, for empiric antibiotics  Vancomycin 2 g IV given in ED at  0445  Goal of Therapy:   Vancomycin trough level 15-20 mcg/ml  Plan:  Vancomycin 1 g IV q8h Rocephin 2 g IV q12h Acyclovir 550 mg IV q8h  Caryl Pina 03/01/2014,7:20 AM

## 2014-03-01 NOTE — ED Notes (Signed)
Spoke with IR. Reports 30 more minutes before, pt coming over to unit. Pt made aware. Neuro is at bedside.

## 2014-03-01 NOTE — ED Notes (Signed)
hospitalist at the bedside 

## 2014-03-01 NOTE — ED Provider Notes (Signed)
CSN: [id number]     Arrival date & time 02/28/14  2019 History   First MD Initiated Contact with Patient 03/01/14 0032     Chief Complaint  Patient presents with  . Migraine     (Consider location/radiation/quality/duration/timing/severity/associated sxs/prior Treatment) HPI Lorraine Conrad is a 43 y.o. female with no significant past medical history coming in with headache. Patient states she normally does not get headaches and her last one was when she was 43 years old. Her headache is diffuse and radiates into her neck. This has been going on for the past week. She admits to fevers and chills during the interval as well. Last night she began vomiting, every hour all way up until today. She does admit to next is Mrs. well. At home her temperature was as high as 100.4. Patient has been seen by primary care physician and obtain a CT scan that was negative. She has no rales he followup, presents emergency department out of concern for persistent headache and vomiting. She denies any other neurological symptoms such as blurry vision weakness or numbness. Patient has no further complaints.  10 Systems reviewed and are negative for acute change except as noted in the HPI.     Past Medical History  Diagnosis Date  . GERD (gastroesophageal reflux disease)   . Headache(784.0)   . Endometriosis   . Ovarian cyst, left   . External hemorrhoids   . Erosive gastritis   . Proctalgia fugax   . Allergy     SEASONAL  . Anemia   . Anxiety     PT. DENIES   Past Surgical History  Procedure Laterality Date  . Cesarean section      x 2  . Temporomandibular joint surgery    . Sinus surgery with instatrak    . Laparoscopic ovarian cystectomy    . Upper gastrointestinal endoscopy    . Colonoscopy    . Abdominal hysterectomy     Family History  Problem Relation Age of Onset  . Breast cancer Maternal Grandmother   . Colon cancer Neg Hx   . Prostate cancer Paternal Grandfather   . Colon polyps  Father   . Heart disease Father    History  Substance Use Topics  . Smoking status: Never Smoker   . Smokeless tobacco: Never Used  . Alcohol Use: Yes     Comment: SOCIALLY   OB History   Grav Para Term Preterm Abortions TAB SAB Ect Mult Living                 Review of Systems    Allergies  Promethazine hcl; Codeine; Dilaudid; and Azithromycin  Home Medications   Prior to Admission medications   Medication Sig Start Date End Date Taking? Authorizing Provider  acetaminophen (TYLENOL) 500 MG tablet Take 1,000 mg by mouth 2 (two) times daily as needed for pain. For pain   Yes Historical Provider, MD  butalbital-acetaminophen-caffeine (FIORICET) 50-325-40 MG per tablet Take 1-2 tablets by mouth every 6 (six) hours as needed for headache. 02/24/14 02/24/15 Yes Shari A Upstill, PA-C  cyclobenzaprine (FLEXERIL) 10 MG tablet Take 1 tablet (10 mg total) by mouth 3 (three) times daily as needed for muscle spasms. 02/23/14  Yes Midge Minium, MD  ibuprofen (ADVIL,MOTRIN) 200 MG tablet Take 400 mg by mouth every 6 (six) hours as needed for pain. For pain   Yes Historical Provider, MD  lansoprazole (PREVACID) 30 MG capsule Take 30 mg by mouth daily at 12  noon.   Yes Historical Provider, MD  naproxen (NAPROSYN) 500 MG tablet Take 1 tablet (500 mg total) by mouth 2 (two) times daily with a meal. 02/23/14 02/23/15 Yes Midge Minium, MD  ondansetron (ZOFRAN-ODT) 4 MG disintegrating tablet Take 4 mg by mouth every 8 (eight) hours as needed for nausea or vomiting.   Yes Historical Provider, MD  SUMAtriptan (IMITREX) 50 MG tablet Take 1 tablet (50 mg total) by mouth every 2 (two) hours as needed for migraine or headache. May repeat in 2 hours if headache persists or recurs. Max dose is 2 tablets within 24 hour period. 02/27/14   Meriam Sprague Saguier, PA-C   BP 121/82  Pulse 58  Temp(Src) 97.9 F (36.6 C) (Oral)  Resp 21  Ht 5\' 4"  (1.626 m)  Wt 212 lb (96.163 kg)  BMI 36.37 kg/m2  SpO2 98%   LMP 02/08/2013 Physical Exam  Nursing note and vitals reviewed. Constitutional: She is oriented to person, place, and time. She appears well-developed and well-nourished. No distress.  HENT:  Head: Normocephalic and atraumatic.  Nose: Nose normal.  Mouth/Throat: Oropharynx is clear and moist. No oropharyngeal exudate.  Eyes: Conjunctivae and EOM are normal. Pupils are equal, round, and reactive to light. No scleral icterus.  Delayed extraocular movements on right lateral gaze.  Neck: Normal range of motion. Neck supple. No JVD present. No tracheal deviation present. No thyromegaly present.  Cardiovascular: Normal rate, regular rhythm and normal heart sounds.  Exam reveals no gallop and no friction rub.   No murmur heard. Pulmonary/Chest: Effort normal and breath sounds normal. No respiratory distress. She has no wheezes. She exhibits no tenderness.  Abdominal: Soft. Bowel sounds are normal. She exhibits no distension and no mass. There is no tenderness. There is no rebound and no guarding.  Musculoskeletal: Normal range of motion. She exhibits no edema and no tenderness.  Lymphadenopathy:    She has no cervical adenopathy.  Neurological: She is alert and oriented to person, place, and time. No cranial nerve deficit. She exhibits normal muscle tone. Coordination normal.  Normal strength and sensation x4 extremities. Normal cerebellar testing.  Skin: Skin is warm and dry. No rash noted. She is not diaphoretic. No erythema. No pallor.    ED Course  LUMBAR PUNCTURE Date/Time: 03/01/2014 7:25 PM Performed by: Everlene Balls Authorized by: Everlene Balls Consent: written consent obtained. Risks and benefits: risks, benefits and alternatives were discussed Consent given by: patient Patient understanding: patient states understanding of the procedure being performed Patient consent: the patient's understanding of the procedure matches consent given Procedure consent: procedure consent matches  procedure scheduled Relevant documents: relevant documents present and verified Test results: test results available and properly labeled Site marked: the operative site was marked Imaging studies: imaging studies available Patient identity confirmed: verbally with patient, arm band and hospital-assigned identification number Time out: Immediately prior to procedure a "time out" was called to verify the correct patient, procedure, equipment, support staff and site/side marked as required. Indications: evaluation for infection Anesthesia: local infiltration Local anesthetic: lidocaine 1% with epinephrine Anesthetic total: 10 ml Patient sedated: no Preparation: Patient was prepped and draped in the usual sterile fashion. Lumbar space: L3-L4 interspace Patient's position: left lateral decubitus Needle gauge: 20 Needle type: spinal needle - Quincke tip Needle length: 3.5 in Number of attempts: 4 Post-procedure: site cleaned, pressure dressing applied and adhesive bandage applied Patient tolerance: Patient tolerated the procedure well with no immediate complications. Comments: Failed attempts   (including critical care  time) Labs Review Labs Reviewed  CBC WITH DIFFERENTIAL - Abnormal; Notable for the following:    WBC 16.3 (*)    Platelets 414 (*)    Neutrophils Relative % 88 (*)    Neutro Abs 14.2 (*)    Lymphocytes Relative 8 (*)    All other components within normal limits  COMPREHENSIVE METABOLIC PANEL - Abnormal; Notable for the following:    Glucose, Bld 101 (*)    All other components within normal limits  CSF CULTURE  GRAM STAIN  LACTIC ACID, PLASMA  LIPASE, BLOOD  PROTIME-INR  URINALYSIS, ROUTINE W REFLEX MICROSCOPIC  PREGNANCY, URINE  CSF CELL COUNT WITH DIFFERENTIAL  GLUCOSE, CSF  PROTEIN, CSF    Imaging Review Ct Head Wo Contrast  02/27/2014   CLINICAL DATA:  Headaches.  EXAM: CT HEAD WITHOUT CONTRAST  TECHNIQUE: Contiguous axial images were obtained from the  base of the skull through the vertex without intravenous contrast.  COMPARISON:  10/09/2012.  FINDINGS: The ventricles are normal in size and configuration. No extra-axial fluid collections are identified. The gray-white differentiation is normal. No CT findings for acute intracranial process such as hemorrhage or infarction. No mass lesions. The brainstem and cerebellum are grossly normal.  The bony structures are intact. The paranasal sinuses and mastoid air cells are clear. The globes are intact.  IMPRESSION: No acute intracranial findings or mass lesions to account for the patient's headaches.   Electronically Signed   By: Kalman Jewels M.D.   On: 02/27/2014 16:08     EKG Interpretation None      MDM   Final diagnoses:  None    Patient does emergency department out of concern for fever headache and vomiting. I do have concern for meningitis in this patient. He also has an abnormal neurological exam with a delayed and lagging right lateral gaze. This often causes her headache and makes her close her eyes when I test this. CT scan from 2 days ago was negative, doubt the need for repeat today. Will obtain lumbar puncture and admit the patient.  LP was attempted but failed.  Patient placed on antibiotics for suspicion of meningitis and admitted for LP done by radiology.  Her symptoms persist despite headache therapy.  WBC is 16, no documented fever here.  She will be admitted to hospitalist service.  Everlene Balls, MD 03/01/14 [phone]

## 2014-03-02 ENCOUNTER — Inpatient Hospital Stay (HOSPITAL_COMMUNITY): Payer: BC Managed Care – PPO

## 2014-03-02 DIAGNOSIS — A879 Viral meningitis, unspecified: Secondary | ICD-10-CM

## 2014-03-02 LAB — COMPREHENSIVE METABOLIC PANEL
ALBUMIN: 3.2 g/dL — AB (ref 3.5–5.2)
ALT: 26 U/L (ref 0–35)
AST: 21 U/L (ref 0–37)
Alkaline Phosphatase: 80 U/L (ref 39–117)
Anion gap: 11 (ref 5–15)
BUN: 12 mg/dL (ref 6–23)
CALCIUM: 8.7 mg/dL (ref 8.4–10.5)
CHLORIDE: 102 meq/L (ref 96–112)
CO2: 23 meq/L (ref 19–32)
CREATININE: 0.73 mg/dL (ref 0.50–1.10)
GFR calc Af Amer: >90 mL/min (ref >90)
Glucose, Bld: 165 mg/dL — ABNORMAL HIGH (ref 70–99)
Potassium: 3.9 mEq/L (ref 3.7–5.3)
SODIUM: 136 meq/L — AB (ref 137–147)
Total Bilirubin: <0.2 mg/dL — ABNORMAL LOW (ref 0.3–1.2)
Total Protein: 7 g/dL (ref 6.0–8.3)

## 2014-03-02 LAB — CBC
HCT: 39.1 % (ref 36.0–46.0)
HEMOGLOBIN: 12.6 g/dL (ref 12.0–15.0)
MCH: 28.1 pg (ref 26.0–34.0)
MCHC: 32.2 g/dL (ref 30.0–36.0)
MCV: 87.1 fL (ref 78.0–100.0)
Platelets: 419 10*3/uL — ABNORMAL HIGH (ref 150–400)
RBC: 4.49 MIL/uL (ref 3.87–5.11)
RDW: 13.6 % (ref 11.5–15.5)
WBC: 15.5 10*3/uL — ABNORMAL HIGH (ref 4.0–10.5)

## 2014-03-02 LAB — PATHOLOGIST SMEAR REVIEW: PATH REVIEW: INCREASED

## 2014-03-02 LAB — ROCKY MTN SPOTTED FVR AB, IGM-BLOOD: RMSF IgM: 0.33 IV (ref 0.00–0.89)

## 2014-03-02 LAB — APTT: APTT: 31 s (ref 24–37)

## 2014-03-02 LAB — ROCKY MTN SPOTTED FVR AB, IGG-BLOOD: RMSF IgG: 0.13 IV

## 2014-03-02 MED ORDER — VALPROATE SODIUM 500 MG/5ML IV SOLN
500.0000 mg | Freq: Three times a day (TID) | INTRAVENOUS | Status: AC
  Administered 2014-03-02 – 2014-03-03 (×3): 500 mg via INTRAVENOUS
  Filled 2014-03-02 (×3): qty 5

## 2014-03-02 MED ORDER — GADOBENATE DIMEGLUMINE 529 MG/ML IV SOLN
20.0000 mL | Freq: Once | INTRAVENOUS | Status: AC | PRN
Start: 2014-03-02 — End: 2014-03-02
  Administered 2014-03-02: 20 mL via INTRAVENOUS

## 2014-03-02 NOTE — Progress Notes (Signed)
Utilization review completed.  

## 2014-03-02 NOTE — Progress Notes (Addendum)
[facility] PROGRESS NOTE  Lorraine Conrad WER:[id number] DOB: 23-Feb-1971 DOA: 03/01/2014 PCP: Annye Asa, MD  Assessment/Plan: 1. Viral meningitis -LP with lymphocytosis -asked lab to add on HSV PCR to CSF -check MRI -await Cx, if negative DC Abx -neuro following -supportive care -decadron  2. GERD -continue PPI  Code Status: Full COde Family Communication: d/w spouse at bedside Disposition Plan: home when improved   Consultants:  Neuro  Procedures:  LP  Antibiotics:  Vanc/Rocephin/Acyclovir  HPI/Subjective: Starting to feel better now  Objective: Filed Vitals:   03/02/14 1452  BP: 122/75  Pulse: 91  Temp: 98.3 F (36.8 C)  Resp: 20    Intake/Output Summary (Last 24 hours) at 03/02/14 1507 Last data filed at 03/02/14 1046  Gross per 24 hour  Intake    944 ml  Output      0 ml  Net    944 ml   Filed Weights   02/28/14 2024 03/01/14 1543  Weight: 96.163 kg (212 lb) 96.163 kg (212 lb)    Exam:   General:  AAOx3, more comfortable appearing  Cardiovascular: S1S2/RRR  Respiratory: CTAB  Abdomen: soft, NT, BS present  Musculoskeletal: no edema c/c   Data Reviewed: Basic Metabolic Panel:  Recent Labs Lab 02/24/14 1915 02/27/14 1815 03/01/14 0130 03/02/14 0711  NA 139 138 137 136*  K 3.8 4.1 3.8 3.9  CL 101 100 100 102  CO2 25 27 24 23   GLUCOSE 96 89 101* 165*  BUN 6 8 9 12   CREATININE 0.90 0.90 0.72 0.73  CALCIUM 9.1 8.9 8.6 8.7   Liver Function Tests:  Recent Labs Lab 03/01/14 0130 03/02/14 0711  AST 10 21  ALT 7 26  ALKPHOS 74 80  BILITOT 0.3 <0.2*  PROT 7.2 7.0  ALBUMIN 3.5 3.2*    Recent Labs Lab 03/01/14 0130  LIPASE 17   No results found for this basename: AMMONIA,  in the last 168 hours CBC:  Recent Labs Lab 02/24/14 1915 02/27/14 1815 03/01/14 0130 03/02/14 0711  WBC 10.2 10.7* 16.3* 15.5*  NEUTROABS 6.7 7.3 14.2*  --   HGB 13.2 12.4 12.6 12.6  HCT 40.5 38.1 37.2 39.1  MCV 88.2 88.2  84.7 87.1  PLT 428* 390 414* 419*   Cardiac Enzymes: No results found for this basename: CKTOTAL, CKMB, CKMBINDEX, TROPONINI,  in the last 168 hours BNP (last 3 results) No results found for this basename: PROBNP,  in the last 8760 hours CBG: No results found for this basename: GLUCAP,  in the last 168 hours  Recent Results (from the past 240 hour(s))  URINE CULTURE     Status: None   Collection Time    02/23/14  2:00 PM      Result Value Ref Range Status   Colony Count 9,000 COLONIES/ML   Final   Organism ID, Bacteria Insignificant Growth   Final  CULTURE, BLOOD (ROUTINE X 2)     Status: None   Collection Time    03/01/14  4:15 AM      Result Value Ref Range Status   Specimen Description BLOOD LEFT ANTECUBITAL   Final   Special Requests BOTTLES DRAWN AEROBIC ONLY 10CC   Final   Culture  Setup Time     Final   Value: 03/01/2014 08:38     Performed at [facility]   Culture     Final   Value:        BLOOD CULTURE RECEIVED NO GROWTH TO DATE CULTURE  WILL BE HELD FOR 5 DAYS BEFORE ISSUING A FINAL NEGATIVE REPORT     Performed at [facility]   Report Status PENDING   Incomplete  CULTURE, BLOOD (ROUTINE X 2)     Status: None   Collection Time    03/01/14  4:37 AM      Result Value Ref Range Status   Specimen Description BLOOD HAND RIGHT   Final   Special Requests BOTTLES DRAWN AEROBIC ONLY 5CC   Final   Culture  Setup Time     Final   Value: 03/01/2014 08:38     Performed at [facility]   Culture     Final   Value:        BLOOD CULTURE RECEIVED NO GROWTH TO DATE CULTURE WILL BE HELD FOR 5 DAYS BEFORE ISSUING A FINAL NEGATIVE REPORT     Performed at [facility]   Report Status PENDING   Incomplete  CSF CULTURE     Status: None   Collection Time    03/01/14  3:00 PM      Result Value Ref Range Status   Specimen Description CSF   Final   Special Requests 3CC   Final   Gram Stain     Final   Value: WBC PRESENT, PREDOMINANTLY MONONUCLEAR      NO ORGANISMS SEEN     CYTOSPIN Performed at [facility]     Performed at [facility]   Culture PENDING   Incomplete   Report Status PENDING   Incomplete  GRAM STAIN     Status: None   Collection Time    03/01/14  3:19 PM      Result Value Ref Range Status   Specimen Description CSF   Final   Special Requests NONE 3CC   Final   Gram Stain     Final   Value: CYTOSPIN PREP     WBC PRESENT, PREDOMINANTLY MONONUCLEAR     NO ORGANISMS SEEN   Report Status 03/01/2014 FINAL   Final     Studies: Dg Fluoro Guide Lumbar Puncture  03/01/2014   CLINICAL DATA:  Headache.  Possible meningitis.  EXAM: DIAGNOSTIC LUMBAR PUNCTURE UNDER FLUOROSCOPIC GUIDANCE  FLUOROSCOPY TIME:  0 min 46 seconds.  PROCEDURE: Informed consent was obtained from the patient prior to the procedure, including potential complications of headache, allergy, and pain. With the patient prone, the lower back was prepped with Betadine. 1% Lidocaine was used for local anesthesia. Lumbar puncture was performed at the L3-L4 level using a 22 gauge needle with return of CSF CSF with an opening pressure of 14 cm water. 12.61ml of CSF were obtained for laboratory studies. The patient tolerated the procedure well and there were no apparent complications.  IMPRESSION: Successful fluoroscopic directed lumbar puncture.   Electronically Signed   By: Marcello Moores  Register   On: 03/01/2014 15:35    Scheduled Meds: . cefTRIAXone (ROCEPHIN)  IV  2 g Intravenous Q12H  . dexamethasone  10 mg Intravenous 4 times per day  . heparin  5,000 Units Subcutaneous 3 times per day  . pantoprazole  40 mg Oral Daily  . sodium chloride  3 mL Intravenous Q12H  . valproate sodium  500 mg Intravenous 3 times per day  . vancomycin  1,000 mg Intravenous Q8H   Continuous Infusions: . sodium chloride 1,000 mL (03/01/14 1719)   Antibiotics Given (last 72 hours)   Date/Time Action Medication Dose Rate   03/01/14 1815  Given   cefTRIAXone (ROCEPHIN) 2 g in  dextrose 5 % 50 mL IVPB 2 g 100 mL/hr   03/01/14 2157 Given   vancomycin (VANCOCIN) IVPB 1000 mg/200 mL premix 1,000 mg 200 mL/hr   03/02/14 8563 Given   cefTRIAXone (ROCEPHIN) 2 g in dextrose 5 % 50 mL IVPB 2 g 100 mL/hr   03/02/14 0618 Given   vancomycin (VANCOCIN) IVPB 1000 mg/200 mL premix 1,000 mg 200 mL/hr      Principal Problem:   Headache(784.0) Active Problems:   GERD   Migraine with status migrainosus   Meningitis, viral    Time spent: 4min    Lorraine Conrad  [facility] Pager [phone]. If 7PM-7AM, please contact night-coverage at [email or web address], password [facility] 03/02/2014, 3:07 PM  LOS: 1 day

## 2014-03-02 NOTE — Care Management Note (Signed)
    Page 1 of 1   03/02/2014     2:22:42 PM CARE MANAGEMENT NOTE 03/02/2014  Patient:  Lorraine Conrad, Lorraine Conrad   Account Number:  [id number]  Date Initiated:  03/02/2014  Documentation initiated by:  Tomi Bamberger  Subjective/Objective Assessment:   dx ha  admit- lives with family.     Action/Plan:   Anticipated DC Date:  03/04/2014   Anticipated DC Plan:  [place]  CM consult      Choice offered to / List presented to:             Status of service:  In process, will continue to follow Medicare Important Message given?  NO (If response is "NO", the following Medicare IM given date fields will be blank) Date Medicare IM given:   Medicare IM given by:   Date Additional Medicare IM given:   Additional Medicare IM given by:    Discharge Disposition:    Per UR Regulation:  Reviewed for med. necessity/level of care/duration of stay  If discussed at [place] of Stay Meetings, dates discussed:    Comments:

## 2014-03-02 NOTE — Progress Notes (Signed)
Subjective: Patient reports that headache waxes and wanes.  Rates current headache at a 6/10.  Was able to eat without nausea and vomiting last night.  Feels better overall.    Objective: Current vital signs: BP 117/68  Pulse 61  Temp(Src) 97.8 F (36.6 C) (Oral)  Resp 20  Ht 5\' 4"  (1.626 m)  Wt 96.163 kg (212 lb)  BMI 36.37 kg/m2  SpO2 95%  LMP 02/08/2013 Vital signs in last 24 hours: Temp:  [97.8 F (36.6 C)-98.8 F (37.1 C)] 97.8 F (36.6 C) (09/24 0532) Pulse Rate:  [61-82] 61 (09/24 0532) Resp:  [20-22] 20 (09/24 0532) BP: (94-117)/(47-68) 117/68 mmHg (09/24 0532) SpO2:  [94 %-98 %] 95 % (09/24 0532) Weight:  [96.163 kg (212 lb)] 96.163 kg (212 lb) (09/23 1543)  Intake/Output from previous day: 09/23 0701 - 09/24 0700 In: 833 [IV Piggyback:833] Out: -  Intake/Output this shift:   Nutritional status: General  Neurologic Exam: Mental Status:  Alert, oriented, thought content appropriate. Speech fluent without evidence of aphasia. Able to follow 3 step commands without difficulty.  Cranial Nerves:  II: Discs flat bilaterally; Visual fields grossly normal, pupils equal, round, reactive to light and accommodation  III,IV, VI: ptosis not present, extra-ocular motions intact bilaterally  V,VII: smile symmetric, facial light touch sensation normal bilaterally  VIII: hearing normal bilaterally  IX,X: gag reflex present  XI: bilateral shoulder shrug  XII: midline tongue extension without atrophy or fasciculations  Motor:  5/5 throughout; no atrophy noted  Sensory: Pinprick and light touch intact throughout, bilaterally  Deep Tendon Reflexes:  2+ throughout  Plantars:  Right: downgoing Left: downgoing  Cerebellar:  normal finger-to-nose, normal heel-to-shin testing   Lab Results: Basic Metabolic Panel:  Recent Labs Lab 02/24/14 1915 02/27/14 1815 03/01/14 0130 03/02/14 0711  NA 139 138 137 136*  K 3.8 4.1 3.8 3.9  CL 101 100 100 102  CO2 25 27 24 23    GLUCOSE 96 89 101* 165*  BUN 6 8 9 12   CREATININE 0.90 0.90 0.72 0.73  CALCIUM 9.1 8.9 8.6 8.7    Liver Function Tests:  Recent Labs Lab 03/01/14 0130 03/02/14 0711  AST 10 21  ALT 7 26  ALKPHOS 74 80  BILITOT 0.3 <0.2*  PROT 7.2 7.0  ALBUMIN 3.5 3.2*    Recent Labs Lab 03/01/14 0130  LIPASE 17   No results found for this basename: AMMONIA,  in the last 168 hours  CBC:  Recent Labs Lab 02/23/14 1359 02/24/14 1915 02/27/14 1815 03/01/14 0130 03/02/14 0711  WBC 10.8* 10.2 10.7* 16.3* 15.5*  NEUTROABS 8.5* 6.7 7.3 14.2*  --   HGB 13.1 13.2 12.4 12.6 12.6  HCT 39.7 40.5 38.1 37.2 39.1  MCV 85.7 88.2 88.2 84.7 87.1  PLT 429.0* 428* 390 414* 419*    Cardiac Enzymes: No results found for this basename: CKTOTAL, CKMB, CKMBINDEX, TROPONINI,  in the last 168 hours  Lipid Panel: No results found for this basename: CHOL, TRIG, HDL, CHOLHDL, VLDL, LDLCALC,  in the last 168 hours  CBG: No results found for this basename: GLUCAP,  in the last 168 hours  Microbiology: Results for orders placed during the hospital encounter of 03/01/14  CULTURE, BLOOD (ROUTINE X 2)     Status: None   Collection Time    03/01/14  4:15 AM      Result Value Ref Range Status   Specimen Description BLOOD LEFT ANTECUBITAL   Final   Special Requests BOTTLES DRAWN AEROBIC ONLY  10CC   Final   Culture  Setup Time     Final   Value: 03/01/2014 08:38     Performed at [facility]   Culture     Final   Value:        BLOOD CULTURE RECEIVED NO GROWTH TO DATE CULTURE WILL BE HELD FOR 5 DAYS BEFORE ISSUING A FINAL NEGATIVE REPORT     Performed at [facility]   Report Status PENDING   Incomplete  CULTURE, BLOOD (ROUTINE X 2)     Status: None   Collection Time    03/01/14  4:37 AM      Result Value Ref Range Status   Specimen Description BLOOD HAND RIGHT   Final   Special Requests BOTTLES DRAWN AEROBIC ONLY 5CC   Final   Culture  Setup Time     Final   Value: 03/01/2014 08:38      Performed at [facility]   Culture     Final   Value:        BLOOD CULTURE RECEIVED NO GROWTH TO DATE CULTURE WILL BE HELD FOR 5 DAYS BEFORE ISSUING A FINAL NEGATIVE REPORT     Performed at [facility]   Report Status PENDING   Incomplete  CSF CULTURE     Status: None   Collection Time    03/01/14  3:00 PM      Result Value Ref Range Status   Specimen Description CSF   Final   Special Requests 3CC   Final   Gram Stain     Final   Value: WBC PRESENT, PREDOMINANTLY MONONUCLEAR     NO ORGANISMS SEEN     CYTOSPIN Performed at [facility]     Performed at [facility]   Culture PENDING   Incomplete   Report Status PENDING   Incomplete  GRAM STAIN     Status: None   Collection Time    03/01/14  3:19 PM      Result Value Ref Range Status   Specimen Description CSF   Final   Special Requests NONE 3CC   Final   Gram Stain     Final   Value: CYTOSPIN PREP     WBC PRESENT, PREDOMINANTLY MONONUCLEAR     NO ORGANISMS SEEN   Report Status 03/01/2014 FINAL   Final    Coagulation Studies:  Recent Labs  03/01/14 0130  LABPROT 13.9  INR 1.07    Imaging: Dg Fluoro Guide Lumbar Puncture  03/01/2014   CLINICAL DATA:  Headache.  Possible meningitis.  EXAM: DIAGNOSTIC LUMBAR PUNCTURE UNDER FLUOROSCOPIC GUIDANCE  FLUOROSCOPY TIME:  0 min 46 seconds.  PROCEDURE: Informed consent was obtained from the patient prior to the procedure, including potential complications of headache, allergy, and pain. With the patient prone, the lower back was prepped with Betadine. 1% Lidocaine was used for local anesthesia. Lumbar puncture was performed at the L3-L4 level using a 22 gauge needle with return of CSF CSF with an opening pressure of 14 cm water. 12.72ml of CSF were obtained for laboratory studies. The patient tolerated the procedure well and there were no apparent complications.  IMPRESSION: Successful fluoroscopic directed lumbar puncture.   Electronically Signed    By: Marcello Moores  Register   On: 03/01/2014 15:35    Medications:  I have reviewed the patient's current medications. Scheduled: . acyclovir  550 mg Intravenous Q8H  . cefTRIAXone (ROCEPHIN)  IV  2 g Intravenous Q12H  .  dexamethasone  10 mg Intravenous 4 times per day  . heparin  5,000 Units Subcutaneous 3 times per day  . pantoprazole  40 mg Oral Daily  . sodium chloride  3 mL Intravenous Q12H  . vancomycin  1,000 mg Intravenous Q8H    Assessment/Plan: Neurological examination remains non focal.  LP results reviewed and show an elevated protein at 135.  Glucose is normal.  WBC's 610 (93% lymphs) and RBC's 6.  Findings consistent with a viral meningitis.  Doubt herpes encephalitis based on duration of symptoms prior to presentation.    Recommendations: 1.  Would D/C Acyclovir 2.  After culture negative for 24 hours would discontinue other antibiotics as well if remain negative  3.  Depacon 500mg  q 8 hours 4.  Would continue Flexeril prn and consider po analgesia to obtain pain control po prior to discharge.   LOS: 1 day   Alexis Goodell, MD [facility] [phone] 03/02/2014  10:22 AM

## 2014-03-02 NOTE — ED Provider Notes (Signed)
Medical screening examination/treatment/procedure(s) were performed by non-physician practitioner and as supervising physician I was immediately available for consultation/collaboration.     Veryl Speak, MD 03/02/14 [phone]

## 2014-03-03 LAB — HERPES SIMPLEX VIRUS(HSV) DNA BY PCR
HSV 1 DNA: NOT DETECTED
HSV 2 DNA: NOT DETECTED

## 2014-03-03 LAB — LYME DISEASE DNA BY PCR(BORRELIA BURG): Lyme Disease(B.burgdorferi)PCR: NOT DETECTED

## 2014-03-03 MED ORDER — DEXAMETHASONE SODIUM PHOSPHATE 4 MG/ML IJ SOLN
4.0000 mg | Freq: Two times a day (BID) | INTRAMUSCULAR | Status: AC
  Administered 2014-03-03: 4 mg via INTRAVENOUS
  Filled 2014-03-03: qty 1

## 2014-03-03 MED ORDER — DEXAMETHASONE SODIUM PHOSPHATE 4 MG/ML IJ SOLN
4.0000 mg | Freq: Two times a day (BID) | INTRAMUSCULAR | Status: DC
  Filled 2014-03-03: qty 1

## 2014-03-03 MED ORDER — ACETAMINOPHEN 325 MG PO TABS
650.0000 mg | ORAL_TABLET | Freq: Four times a day (QID) | ORAL | Status: DC | PRN
  Administered 2014-03-03 – 2014-03-04 (×2): 650 mg via ORAL
  Filled 2014-03-03 (×2): qty 2

## 2014-03-03 NOTE — Progress Notes (Signed)
Subjective: Headache started to improve as of yesterday. Patient rates headache this morning at a 2/10.  Has been out of bed and ambulating around the room.  Able to eat.    Objective: Current vital signs: BP 113/64  Pulse 65  Temp(Src) 98 F (36.7 C) (Oral)  Resp 21  Ht 5\' 4"  (1.626 m)  Wt 96.163 kg (212 lb)  BMI 36.37 kg/m2  SpO2 93%  LMP 02/08/2013 Vital signs in last 24 hours: Temp:  [97.5 F (36.4 C)-98.5 F (36.9 C)] 98 F (36.7 C) (09/25 0612) Pulse Rate:  [65-91] 65 (09/25 0612) Resp:  [20-24] 21 (09/25 0612) BP: (113-142)/(64-92) 113/64 mmHg (09/25 0612) SpO2:  [93 %-94 %] 93 % (09/25 0612)  Intake/Output from previous day: 09/24 0701 - 09/25 0700 In: 6391 [P.O.:600; I.V.:4870; IV Piggyback:921] Out: -  Intake/Output this shift:   Nutritional status: General  Neurologic Exam: Mental Status:  Alert, oriented, thought content appropriate. Speech fluent without evidence of aphasia. Able to follow 3 step commands without difficulty.  Cranial Nerves:  II: Discs flat bilaterally; Visual fields grossly normal, pupils equal, round, reactive to light and accommodation  III,IV, VI: ptosis not present, extra-ocular motions intact bilaterally  V,VII: smile symmetric, facial light touch sensation normal bilaterally  VIII: hearing normal bilaterally  IX,X: gag reflex present  XI: bilateral shoulder shrug  XII: midline tongue extension without atrophy or fasciculations  Motor:  5/5 throughout; no atrophy noted  Sensory: Pinprick and light touch intact throughout, bilaterally  Deep Tendon Reflexes:  2+ throughout  Plantars:  Right: downgoing Left: downgoing  Cerebellar:  normal finger-to-nose, normal heel-to-shin testing   Lab Results: Basic Metabolic Panel:  Recent Labs Lab 02/24/14 1915 02/27/14 1815 03/01/14 0130 03/02/14 0711  NA 139 138 137 136*  K 3.8 4.1 3.8 3.9  CL 101 100 100 102  CO2 25 27 24 23   GLUCOSE 96 89 101* 165*  BUN 6 8 9 12    CREATININE 0.90 0.90 0.72 0.73  CALCIUM 9.1 8.9 8.6 8.7    Liver Function Tests:  Recent Labs Lab 03/01/14 0130 03/02/14 0711  AST 10 21  ALT 7 26  ALKPHOS 74 80  BILITOT 0.3 <0.2*  PROT 7.2 7.0  ALBUMIN 3.5 3.2*    Recent Labs Lab 03/01/14 0130  LIPASE 17   No results found for this basename: AMMONIA,  in the last 168 hours  CBC:  Recent Labs Lab 02/24/14 1915 02/27/14 1815 03/01/14 0130 03/02/14 0711  WBC 10.2 10.7* 16.3* 15.5*  NEUTROABS 6.7 7.3 14.2*  --   HGB 13.2 12.4 12.6 12.6  HCT 40.5 38.1 37.2 39.1  MCV 88.2 88.2 84.7 87.1  PLT 428* 390 414* 419*    Cardiac Enzymes: No results found for this basename: CKTOTAL, CKMB, CKMBINDEX, TROPONINI,  in the last 168 hours  Lipid Panel: No results found for this basename: CHOL, TRIG, HDL, CHOLHDL, VLDL, LDLCALC,  in the last 168 hours  CBG: No results found for this basename: GLUCAP,  in the last 168 hours  Microbiology: Results for orders placed during the hospital encounter of 03/01/14  CULTURE, BLOOD (ROUTINE X 2)     Status: None   Collection Time    03/01/14  4:15 AM      Result Value Ref Range Status   Specimen Description BLOOD LEFT ANTECUBITAL   Final   Special Requests BOTTLES DRAWN AEROBIC ONLY 10CC   Final   Culture  Setup Time     Final  Value: 03/01/2014 08:38     Performed at [facility]   Culture     Final   Value:        BLOOD CULTURE RECEIVED NO GROWTH TO DATE CULTURE WILL BE HELD FOR 5 DAYS BEFORE ISSUING A FINAL NEGATIVE REPORT     Performed at [facility]   Report Status PENDING   Incomplete  CULTURE, BLOOD (ROUTINE X 2)     Status: None   Collection Time    03/01/14  4:37 AM      Result Value Ref Range Status   Specimen Description BLOOD HAND RIGHT   Final   Special Requests BOTTLES DRAWN AEROBIC ONLY 5CC   Final   Culture  Setup Time     Final   Value: 03/01/2014 08:38     Performed at [facility]   Culture     Final   Value:        BLOOD  CULTURE RECEIVED NO GROWTH TO DATE CULTURE WILL BE HELD FOR 5 DAYS BEFORE ISSUING A FINAL NEGATIVE REPORT     Performed at [facility]   Report Status PENDING   Incomplete  CSF CULTURE     Status: None   Collection Time    03/01/14  3:00 PM      Result Value Ref Range Status   Specimen Description CSF   Final   Special Requests 3CC   Final   Gram Stain     Final   Value: WBC PRESENT, PREDOMINANTLY MONONUCLEAR     NO ORGANISMS SEEN     CYTOSPIN Performed at [facility]     Performed at [facility]   Culture PENDING   Incomplete   Report Status PENDING   Incomplete  GRAM STAIN     Status: None   Collection Time    03/01/14  3:19 PM      Result Value Ref Range Status   Specimen Description CSF   Final   Special Requests NONE 3CC   Final   Gram Stain     Final   Value: CYTOSPIN PREP     WBC PRESENT, PREDOMINANTLY MONONUCLEAR     NO ORGANISMS SEEN   Report Status 03/01/2014 FINAL   Final    Coagulation Studies:  Recent Labs  03/01/14 0130  LABPROT 13.9  INR 1.07    Imaging: Mr Kizzie Fantasia Contrast  03/02/2014   CLINICAL DATA:  43 year old female with severe headache, worsening. Query viral meningitis. Initial encounter.  EXAM: MRI HEAD WITHOUT AND WITH CONTRAST  TECHNIQUE: Multiplanar, multiecho pulse sequences of the brain and surrounding structures were obtained without and with intravenous contrast.  CONTRAST:  10mL MULTIHANCE GADOBENATE DIMEGLUMINE 529 MG/ML IV SOLN  COMPARISON:  Head CT without contrast 02/27/2014. [name] brain MRI 01/28/2008.  FINDINGS: Major intracranial vascular flow voids are stable. Cerebral volume is normal. No restricted diffusion to suggest acute infarction. No midline shift, mass effect, evidence of mass lesion, ventriculomegaly, extra-axial collection or acute intracranial hemorrhage. Cervicomedullary junction and pituitary are within normal limits.  No abnormal enhancement identified. Negative visualized  cervical spine.  Pearline Cables and white matter signal is within normal limits throughout the brain.  Visible internal auditory structures appear normal. Trace right mastoid fluid, new since 2009. Stable and negative visualized nasopharynx. Other Visualized paranasal sinuses and mastoids are clear. Visualized orbit soft tissues are within normal limits. Visualized scalp soft tissues are within normal limits.  IMPRESSION: 1.  Stable and normal MRI appearance of the brain. Note that Brain MRI is generally only positive in cases of severe/complicated meningitis. 2. Trace right mastoid effusion.   Electronically Signed   By: Lars Pinks M.D.   On: 03/02/2014 16:45   Dg Fluoro Guide Lumbar Puncture  03/01/2014   CLINICAL DATA:  Headache.  Possible meningitis.  EXAM: DIAGNOSTIC LUMBAR PUNCTURE UNDER FLUOROSCOPIC GUIDANCE  FLUOROSCOPY TIME:  0 min 46 seconds.  PROCEDURE: Informed consent was obtained from the patient prior to the procedure, including potential complications of headache, allergy, and pain. With the patient prone, the lower back was prepped with Betadine. 1% Lidocaine was used for local anesthesia. Lumbar puncture was performed at the L3-L4 level using a 22 gauge needle with return of CSF CSF with an opening pressure of 14 cm water. 12.55ml of CSF were obtained for laboratory studies. The patient tolerated the procedure well and there were no apparent complications.  IMPRESSION: Successful fluoroscopic directed lumbar puncture.   Electronically Signed   By: Marcello Moores  Register   On: 03/01/2014 15:35    Medications:  I have reviewed the patient's current medications. Scheduled: . cefTRIAXone (ROCEPHIN)  IV  2 g Intravenous Q12H  . dexamethasone  10 mg Intravenous 4 times per day  . heparin  5,000 Units Subcutaneous 3 times per day  . pantoprazole  40 mg Oral Daily  . sodium chloride  3 mL Intravenous Q12H  . vancomycin  1,000 mg Intravenous Q8H    Assessment/Plan: Headache improving.  Preliminary cultures  are negative.  Bacterial meningitis unlikely.  [place]y [place] spotted fever negative.  Herpes titer pending.    Recommendations: 1.  Discontinue Rocephin and Vancomycin 2.  Taper Dexamethasone 3.  Patient OOB today  Case discussed with Dr. Broadus John   LOS: 2 days   Alexis Goodell, MD [facility] [phone] 03/03/2014  9:00 AM

## 2014-03-03 NOTE — Progress Notes (Signed)
[facility] PROGRESS NOTE  Lorraine Conrad WNI:[id number] DOB: 22-Sep-1970 DOA: 03/01/2014 PCP: Annye Asa, MD  Assessment/Plan: 1. Viral meningitis -LP with lymphocytosis -asked lab to add on HSV PCR to CSF yesterday, still pending -Cx negative, will DC Abx -neuro following -supportive care -wean off decadron -improving  2. GERD -continue PPI  Code Status: Full COde Family Communication: d/w spouse at bedside Disposition Plan: home tomorrow   Consultants:  Neuro  Procedures:  LP  Antibiotics:  Vanc/Rocephin/Acyclovir-9/24  HPI/Subjective: Feels much better, HA 2/10  Objective: Filed Vitals:   03/03/14 0612  BP: 113/64  Pulse: 65  Temp: 98 F (36.7 C)  Resp: 21    Intake/Output Summary (Last 24 hours) at 03/03/14 1153 Last data filed at 03/03/14 0900  Gross per 24 hour  Intake   6280 ml  Output      0 ml  Net   6280 ml   Filed Weights   02/28/14 2024 03/01/14 1543  Weight: 96.163 kg (212 lb) 96.163 kg (212 lb)    Exam:   General:  AAOx3, more comfortable appearing  Cardiovascular: S1S2/RRR  Respiratory: CTAB  Abdomen: soft, NT, BS present  Musculoskeletal: no edema c/c   Data Reviewed: Basic Metabolic Panel:  Recent Labs Lab 02/24/14 1915 02/27/14 1815 03/01/14 0130 03/02/14 0711  NA 139 138 137 136*  K 3.8 4.1 3.8 3.9  CL 101 100 100 102  CO2 25 27 24 23   GLUCOSE 96 89 101* 165*  BUN 6 8 9 12   CREATININE 0.90 0.90 0.72 0.73  CALCIUM 9.1 8.9 8.6 8.7   Liver Function Tests:  Recent Labs Lab 03/01/14 0130 03/02/14 0711  AST 10 21  ALT 7 26  ALKPHOS 74 80  BILITOT 0.3 <0.2*  PROT 7.2 7.0  ALBUMIN 3.5 3.2*    Recent Labs Lab 03/01/14 0130  LIPASE 17   No results found for this basename: AMMONIA,  in the last 168 hours CBC:  Recent Labs Lab 02/24/14 1915 02/27/14 1815 03/01/14 0130 03/02/14 0711  WBC 10.2 10.7* 16.3* 15.5*  NEUTROABS 6.7 7.3 14.2*  --   HGB 13.2 12.4 12.6 12.6  HCT 40.5 38.1  37.2 39.1  MCV 88.2 88.2 84.7 87.1  PLT 428* 390 414* 419*   Cardiac Enzymes: No results found for this basename: CKTOTAL, CKMB, CKMBINDEX, TROPONINI,  in the last 168 hours BNP (last 3 results) No results found for this basename: PROBNP,  in the last 8760 hours CBG: No results found for this basename: GLUCAP,  in the last 168 hours  Recent Results (from the past 240 hour(s))  URINE CULTURE     Status: None   Collection Time    02/23/14  2:00 PM      Result Value Ref Range Status   Colony Count 9,000 COLONIES/ML   Final   Organism ID, Bacteria Insignificant Growth   Final  CULTURE, BLOOD (ROUTINE X 2)     Status: None   Collection Time    03/01/14  4:15 AM      Result Value Ref Range Status   Specimen Description BLOOD LEFT ANTECUBITAL   Final   Special Requests BOTTLES DRAWN AEROBIC ONLY 10CC   Final   Culture  Setup Time     Final   Value: 03/01/2014 08:38     Performed at [facility]   Culture     Final   Value:        BLOOD CULTURE RECEIVED NO GROWTH TO DATE CULTURE  WILL BE HELD FOR 5 DAYS BEFORE ISSUING A FINAL NEGATIVE REPORT     Performed at [facility]   Report Status PENDING   Incomplete  CULTURE, BLOOD (ROUTINE X 2)     Status: None   Collection Time    03/01/14  4:37 AM      Result Value Ref Range Status   Specimen Description BLOOD HAND RIGHT   Final   Special Requests BOTTLES DRAWN AEROBIC ONLY 5CC   Final   Culture  Setup Time     Final   Value: 03/01/2014 08:38     Performed at [facility]   Culture     Final   Value:        BLOOD CULTURE RECEIVED NO GROWTH TO DATE CULTURE WILL BE HELD FOR 5 DAYS BEFORE ISSUING A FINAL NEGATIVE REPORT     Performed at [facility]   Report Status PENDING   Incomplete  CSF CULTURE     Status: None   Collection Time    03/01/14  3:00 PM      Result Value Ref Range Status   Specimen Description CSF   Final   Special Requests 3CC   Final   Gram Stain     Final   Value: WBC PRESENT,  PREDOMINANTLY MONONUCLEAR     NO ORGANISMS SEEN     CYTOSPIN Performed at [facility]     Performed at [facility]   Culture     Final   Value: NO GROWTH 1 DAY     Performed at [facility]   Report Status PENDING   Incomplete  GRAM STAIN     Status: None   Collection Time    03/01/14  3:19 PM      Result Value Ref Range Status   Specimen Description CSF   Final   Special Requests NONE 3CC   Final   Gram Stain     Final   Value: CYTOSPIN PREP     WBC PRESENT, PREDOMINANTLY MONONUCLEAR     NO ORGANISMS SEEN   Report Status 03/01/2014 FINAL   Final     Studies: Mr Lorraine Conrad Contrast  March 08, 2014   CLINICAL DATA:  43 year old female with severe headache, worsening. Query viral meningitis. Initial encounter.  EXAM: MRI HEAD WITHOUT AND WITH CONTRAST  TECHNIQUE: Multiplanar, multiecho pulse sequences of the brain and surrounding structures were obtained without and with intravenous contrast.  CONTRAST:  57mL MULTIHANCE GADOBENATE DIMEGLUMINE 529 MG/ML IV SOLN  COMPARISON:  Head CT without contrast 02/27/2014. [facility] brain MRI 01/28/2008.  FINDINGS: Major intracranial vascular flow voids are stable. Cerebral volume is normal. No restricted diffusion to suggest acute infarction. No midline shift, mass effect, evidence of mass lesion, ventriculomegaly, extra-axial collection or acute intracranial hemorrhage. Cervicomedullary junction and pituitary are within normal limits.  No abnormal enhancement identified. Negative visualized cervical spine.  Pearline Cables and white matter signal is within normal limits throughout the brain.  Visible internal auditory structures appear normal. Trace right mastoid fluid, new since 2009. Stable and negative visualized nasopharynx. Other Visualized paranasal sinuses and mastoids are clear. Visualized orbit soft tissues are within normal limits. Visualized scalp soft tissues are within normal limits.  IMPRESSION: 1. Stable and normal  MRI appearance of the brain. Note that Brain MRI is generally only positive in cases of severe/complicated meningitis. 2. Trace right mastoid effusion.   Electronically Signed   By: Lezlie Octave.D.  On: 03/02/2014 16:45   Dg Fluoro Guide Lumbar Puncture  03/01/2014   CLINICAL DATA:  Headache.  Possible meningitis.  EXAM: DIAGNOSTIC LUMBAR PUNCTURE UNDER FLUOROSCOPIC GUIDANCE  FLUOROSCOPY TIME:  0 min 46 seconds.  PROCEDURE: Informed consent was obtained from the patient prior to the procedure, including potential complications of headache, allergy, and pain. With the patient prone, the lower back was prepped with Betadine. 1% Lidocaine was used for local anesthesia. Lumbar puncture was performed at the L3-L4 level using a 22 gauge needle with return of CSF CSF with an opening pressure of 14 cm water. 12.57ml of CSF were obtained for laboratory studies. The patient tolerated the procedure well and there were no apparent complications.  IMPRESSION: Successful fluoroscopic directed lumbar puncture.   Electronically Signed   By: Marcello Moores  Register   On: 03/01/2014 15:35    Scheduled Meds: . cefTRIAXone (ROCEPHIN)  IV  2 g Intravenous Q12H  . dexamethasone  4 mg Intravenous Q12H  . heparin  5,000 Units Subcutaneous 3 times per day  . pantoprazole  40 mg Oral Daily  . sodium chloride  3 mL Intravenous Q12H  . vancomycin  1,000 mg Intravenous Q8H   Continuous Infusions: . sodium chloride 100 mL/hr at 03/03/14 0356   Antibiotics Given (last 72 hours)   Date/Time Action Medication Dose Rate   03/01/14 1815 Given   cefTRIAXone (ROCEPHIN) 2 g in dextrose 5 % 50 mL IVPB 2 g 100 mL/hr   03/01/14 2157 Given   vancomycin (VANCOCIN) IVPB 1000 mg/200 mL premix 1,000 mg 200 mL/hr   03/02/14 0527 Given   cefTRIAXone (ROCEPHIN) 2 g in dextrose 5 % 50 mL IVPB 2 g 100 mL/hr   03/02/14 0618 Given   vancomycin (VANCOCIN) IVPB 1000 mg/200 mL premix 1,000 mg 200 mL/hr   03/02/14 1716 Given   vancomycin (VANCOCIN)  IVPB 1000 mg/200 mL premix 1,000 mg 200 mL/hr   03/02/14 2014 Given   cefTRIAXone (ROCEPHIN) 2 g in dextrose 5 % 50 mL IVPB 2 g 100 mL/hr   03/03/14 0012 Given   vancomycin (VANCOCIN) IVPB 1000 mg/200 mL premix 1,000 mg 200 mL/hr   03/03/14 5035 Given   cefTRIAXone (ROCEPHIN) 2 g in dextrose 5 % 50 mL IVPB 2 g 100 mL/hr   03/03/14 0601 Given   vancomycin (VANCOCIN) IVPB 1000 mg/200 mL premix 1,000 mg 200 mL/hr      Principal Problem:   Headache(784.0) Active Problems:   GERD   Migraine with status migrainosus   Meningitis, viral    Time spent: 38min    Cayman Kielbasa  [facility] Pager [phone]. If 7PM-7AM, please contact night-coverage at [email or web address], password [facility] 03/03/2014, 11:53 AM  LOS: 2 days

## 2014-03-04 MED ORDER — ACETAMINOPHEN 500 MG PO TABS: 500.0000 mg | ORAL_TABLET | Freq: Four times a day (QID) | ORAL | Status: DC | PRN

## 2014-03-04 NOTE — Progress Notes (Signed)
03/04/14 Patient discharge home today, IV site removed, and discharge instructions reviewed with patient.

## 2014-03-04 NOTE — Discharge Summary (Signed)
Physician Discharge Summary  Lorraine Conrad YSA:[id number] DOB: 1970-09-14 DOA: 03/01/2014  PCP: Annye Asa, MD  Admit date: 03/01/2014 Discharge date: 03/04/2014  Time spent: 45 minutes  Recommendations for Outpatient Follow-up:  1. PCP in 1 week  Discharge Diagnoses:  Principal Problem:   Headache(784.0)   Viral meningitis   GERD   OSA   Remote h/o migraines   Discharge Condition: stable  Diet recommendation: regular  Filed Weights   02/28/14 2024 03/01/14 1543  Weight: 96.163 kg (212 lb) 96.163 kg (212 lb)    History of present illness:  Lorraine Conrad is a 43 y.o. female with past medical history of GERD, IBS, migraine headaches, who presented with worsening headache.  In the past 10 days, she has been having severe headache which is much worse than her usual migraine headache. Headache is constant, 10 out of 10 in severity, pressure-like, radiating to the back of her neck. It is not aggravated or alleviated with any known factors. She also has nausea and frequent vomiting. She has photophobia, fever and chills.   She was seen by her PCP on Monday and treated with Toradol injection after she had negative CT head. She was given referral to neurology, but has not been seen yet. Because of worsening headache, patient came to the emergency room for further evaluation  Hospital Course:  1. Viral meningitis -LP with elevated WBC and protein and differential with lymphocytosis  -HSV PCR of CSF negative -CSF cultures negative, hence Antibiotics stopped -Followed by Neurology as well -improved with supportive care -discharged home in a stable condition  2. GERD  -continue PPI   Procedures:  LP  Consultations:  Neurology  Discharge Exam: Filed Vitals:   03/04/14 0622  BP: 131/84  Pulse: 56  Temp: 98.3 F (36.8 C)  Resp: 17    General: AAOx3 Cardiovascular: S1S2/RRR Respiratory: CTAB  Discharge Instructions You were cared for by a hospitalist during your  hospital stay. If you have any questions about your discharge medications or the care you received while you were in the hospital after you are discharged, you can call the unit and asked to speak with the hospitalist on call if the hospitalist that took care of you is not available. Once you are discharged, your primary care physician will handle any further medical issues. Please note that NO REFILLS for any discharge medications will be authorized once you are discharged, as it is imperative that you return to your primary care physician (or establish a relationship with a primary care physician if you do not have one) for your aftercare needs so that they can reassess your need for medications and monitor your lab values.  Discharge Instructions   Diet general    Complete by:  As directed      Increase activity slowly    Complete by:  As directed           Current Discharge Medication List    CONTINUE these medications which have CHANGED   Details  acetaminophen (TYLENOL) 500 MG tablet Take 1 tablet (500 mg total) by mouth every 6 (six) hours as needed for mild pain or headache. For pain Qty: 30 tablet, Refills: 0      CONTINUE these medications which have NOT CHANGED   Details  ibuprofen (ADVIL,MOTRIN) 200 MG tablet Take 400 mg by mouth every 6 (six) hours as needed for pain. For pain    lansoprazole (PREVACID) 30 MG capsule Take 30 mg by mouth daily at 12 noon.  ondansetron (ZOFRAN-ODT) 4 MG disintegrating tablet Take 4 mg by mouth every 8 (eight) hours as needed for nausea or vomiting.      STOP taking these medications     butalbital-acetaminophen-caffeine (FIORICET) 50-325-40 MG per tablet      cyclobenzaprine (FLEXERIL) 10 MG tablet      naproxen (NAPROSYN) 500 MG tablet      SUMAtriptan (IMITREX) 50 MG tablet        Allergies  Allergen Reactions  . Promethazine Hcl Other (See Comments)    Violent tremors   . Codeine Other (See Comments)    Was a child when  she took this.  . Dilaudid [Hydromorphone] Nausea And Vomiting  . Azithromycin Rash    Head to toe rash   Follow-up Information   Follow up with Annye Asa, MD. Schedule an appointment as soon as possible for a visit in 1 week.   Specialty:  Family Medicine   Contact information:   [place] [phone]        The results of significant diagnostics from this hospitalization (including imaging, microbiology, ancillary and laboratory) are listed below for reference.    Significant Diagnostic Studies: Ct Head Wo Contrast  02/27/2014   CLINICAL DATA:  Headaches.  EXAM: CT HEAD WITHOUT CONTRAST  TECHNIQUE: Contiguous axial images were obtained from the base of the skull through the vertex without intravenous contrast.  COMPARISON:  10/09/2012.  FINDINGS: The ventricles are normal in size and configuration. No extra-axial fluid collections are identified. The gray-white differentiation is normal. No CT findings for acute intracranial process such as hemorrhage or infarction. No mass lesions. The brainstem and cerebellum are grossly normal.  The bony structures are intact. The paranasal sinuses and mastoid air cells are clear. The globes are intact.  IMPRESSION: No acute intracranial findings or mass lesions to account for the patient's headaches.   Electronically Signed   By: Kalman Jewels M.D.   On: 02/27/2014 16:08   Mr Jeri Cos SW Contrast  03/02/2014   CLINICAL DATA:  43 year old female with severe headache, worsening. Query viral meningitis. Initial encounter.  EXAM: MRI HEAD WITHOUT AND WITH CONTRAST  TECHNIQUE: Multiplanar, multiecho pulse sequences of the brain and surrounding structures were obtained without and with intravenous contrast.  CONTRAST:  57mL MULTIHANCE GADOBENATE DIMEGLUMINE 529 MG/ML IV SOLN  COMPARISON:  Head CT without contrast 02/27/2014. [name] brain MRI 01/28/2008.  FINDINGS: Major intracranial vascular  flow voids are stable. Cerebral volume is normal. No restricted diffusion to suggest acute infarction. No midline shift, mass effect, evidence of mass lesion, ventriculomegaly, extra-axial collection or acute intracranial hemorrhage. Cervicomedullary junction and pituitary are within normal limits.  No abnormal enhancement identified. Negative visualized cervical spine.  Pearline Cables and white matter signal is within normal limits throughout the brain.  Visible internal auditory structures appear normal. Trace right mastoid fluid, new since 2009. Stable and negative visualized nasopharynx. Other Visualized paranasal sinuses and mastoids are clear. Visualized orbit soft tissues are within normal limits. Visualized scalp soft tissues are within normal limits.  IMPRESSION: 1. Stable and normal MRI appearance of the brain. Note that Brain MRI is generally only positive in cases of severe/complicated meningitis. 2. Trace right mastoid effusion.   Electronically Signed   By: Lars Pinks M.D.   On: 03/02/2014 16:45   Dg Fluoro Guide Lumbar Puncture  03/01/2014   CLINICAL DATA:  Headache.  Possible meningitis.  EXAM: DIAGNOSTIC LUMBAR PUNCTURE UNDER FLUOROSCOPIC  GUIDANCE  FLUOROSCOPY TIME:  0 min 46 seconds.  PROCEDURE: Informed consent was obtained from the patient prior to the procedure, including potential complications of headache, allergy, and pain. With the patient prone, the lower back was prepped with Betadine. 1% Lidocaine was used for local anesthesia. Lumbar puncture was performed at the L3-L4 level using a 22 gauge needle with return of CSF CSF with an opening pressure of 14 cm water. 12.17ml of CSF were obtained for laboratory studies. The patient tolerated the procedure well and there were no apparent complications.  IMPRESSION: Successful fluoroscopic directed lumbar puncture.   Electronically Signed   By: Marcello Moores  Register   On: 03/01/2014 15:35    Microbiology: Recent Results (from the past 240 hour(s))  URINE  CULTURE     Status: None   Collection Time    02/23/14  2:00 PM      Result Value Ref Range Status   Colony Count 9,000 COLONIES/ML   Final   Organism ID, Bacteria Insignificant Growth   Final  CULTURE, BLOOD (ROUTINE X 2)     Status: None   Collection Time    03/01/14  4:15 AM      Result Value Ref Range Status   Specimen Description BLOOD LEFT ANTECUBITAL   Final   Special Requests BOTTLES DRAWN AEROBIC ONLY 10CC   Final   Culture  Setup Time     Final   Value: 03/01/2014 08:38     Performed at [facility]   Culture     Final   Value:        BLOOD CULTURE RECEIVED NO GROWTH TO DATE CULTURE WILL BE HELD FOR 5 DAYS BEFORE ISSUING A FINAL NEGATIVE REPORT     Performed at [facility]   Report Status PENDING   Incomplete  CULTURE, BLOOD (ROUTINE X 2)     Status: None   Collection Time    03/01/14  4:37 AM      Result Value Ref Range Status   Specimen Description BLOOD HAND RIGHT   Final   Special Requests BOTTLES DRAWN AEROBIC ONLY 5CC   Final   Culture  Setup Time     Final   Value: 03/01/2014 08:38     Performed at [facility]   Culture     Final   Value:        BLOOD CULTURE RECEIVED NO GROWTH TO DATE CULTURE WILL BE HELD FOR 5 DAYS BEFORE ISSUING A FINAL NEGATIVE REPORT     Performed at [facility]   Report Status PENDING   Incomplete  CSF CULTURE     Status: None   Collection Time    03/01/14  3:00 PM      Result Value Ref Range Status   Specimen Description CSF   Final   Special Requests 3CC   Final   Gram Stain     Final   Value: WBC PRESENT, PREDOMINANTLY MONONUCLEAR     NO ORGANISMS SEEN     CYTOSPIN Performed at [facility]     Performed at [facility]   Culture     Final   Value: NO GROWTH 1 DAY     Performed at [facility]   Report Status PENDING   Incomplete  GRAM STAIN     Status: None   Collection Time    03/01/14  3:19 PM      Result Value Ref Range Status   Specimen  Description CSF   Final    Special Requests NONE 3CC   Final   Gram Stain     Final   Value: CYTOSPIN PREP     WBC PRESENT, PREDOMINANTLY MONONUCLEAR     NO ORGANISMS SEEN   Report Status 03/01/2014 FINAL   Final     Labs: Basic Metabolic Panel:  Recent Labs Lab 02/27/14 1815 03/01/14 0130 03/02/14 0711  NA 138 137 136*  K 4.1 3.8 3.9  CL 100 100 102  CO2 27 24 23   GLUCOSE 89 101* 165*  BUN 8 9 12   CREATININE 0.90 0.72 0.73  CALCIUM 8.9 8.6 8.7   Liver Function Tests:  Recent Labs Lab 03/01/14 0130 03/02/14 0711  AST 10 21  ALT 7 26  ALKPHOS 74 80  BILITOT 0.3 <0.2*  PROT 7.2 7.0  ALBUMIN 3.5 3.2*    Recent Labs Lab 03/01/14 0130  LIPASE 17   No results found for this basename: AMMONIA,  in the last 168 hours CBC:  Recent Labs Lab 02/27/14 1815 03/01/14 0130 03/02/14 0711  WBC 10.7* 16.3* 15.5*  NEUTROABS 7.3 14.2*  --   HGB 12.4 12.6 12.6  HCT 38.1 37.2 39.1  MCV 88.2 84.7 87.1  PLT 390 414* 419*   Cardiac Enzymes: No results found for this basename: CKTOTAL, CKMB, CKMBINDEX, TROPONINI,  in the last 168 hours BNP: BNP (last 3 results) No results found for this basename: PROBNP,  in the last 8760 hours CBG: No results found for this basename: GLUCAP,  in the last 168 hours     Signed:  Lindsey Hommel  [facility] 03/04/2014, 9:57 AM

## 2014-03-04 NOTE — Progress Notes (Signed)
Subjective: Patient reports that she continues to improve.  She was able to get up on yesterday and walk the hallways.  Headache is intermittent.    Objective: Current vital signs: BP 131/84  Pulse 56  Temp(Src) 98.3 F (36.8 C) (Oral)  Resp 17  Ht 5\' 4"  (1.626 m)  Wt 96.163 kg (212 lb)  BMI 36.37 kg/m2  SpO2 95%  LMP 02/08/2013 Vital signs in last 24 hours: Temp:  [98.3 F (36.8 C)-98.7 F (37.1 C)] 98.3 F (36.8 C) (09/26 0622) Pulse Rate:  [56-80] 56 (09/26 0622) Resp:  [17-20] 17 (09/26 0622) BP: (121-140)/(66-84) 131/84 mmHg (09/26 0622) SpO2:  [94 %-95 %] 95 % (09/26 0622)  Intake/Output from previous day: 09/25 0701 - 09/26 0700 In: 2665.3 [P.O.:1062; I.V.:1603.3] Out: -  Intake/Output this shift: Total I/O In: 251.7 [I.V.:251.7] Out: -  Nutritional status: General  Neurologic Exam: Mental Status:  Alert, oriented, thought content appropriate. Speech fluent without evidence of aphasia. Able to follow 3 step commands without difficulty.  Cranial Nerves:  II: Discs flat bilaterally; Visual fields grossly normal, pupils equal, round, reactive to light and accommodation  III,IV, VI: ptosis not present, extra-ocular motions intact bilaterally  V,VII: smile symmetric, facial light touch sensation normal bilaterally  VIII: hearing normal bilaterally  IX,X: gag reflex present  XI: bilateral shoulder shrug  XII: midline tongue extension without atrophy or fasciculations  Motor:  5/5 throughout; no atrophy noted  Sensory: Pinprick and light touch intact throughout, bilaterally  Deep Tendon Reflexes:  2+ throughout  Plantars:  Right: downgoing   Left: downgoing  Cerebellar:  normal finger-to-nose, normal heel-to-shin testing      Lab Results: Basic Metabolic Panel:  Recent Labs Lab 02/27/14 1815 03/01/14 0130 03/02/14 0711  NA 138 137 136*  K 4.1 3.8 3.9  CL 100 100 102  CO2 27 24 23   GLUCOSE 89 101* 165*  BUN 8 9 12   CREATININE 0.90 0.72 0.73   CALCIUM 8.9 8.6 8.7    Liver Function Tests:  Recent Labs Lab 03/01/14 0130 03/02/14 0711  AST 10 21  ALT 7 26  ALKPHOS 74 80  BILITOT 0.3 <0.2*  PROT 7.2 7.0  ALBUMIN 3.5 3.2*    Recent Labs Lab 03/01/14 0130  LIPASE 17   No results found for this basename: AMMONIA,  in the last 168 hours  CBC:  Recent Labs Lab 02/27/14 1815 03/01/14 0130 03/02/14 0711  WBC 10.7* 16.3* 15.5*  NEUTROABS 7.3 14.2*  --   HGB 12.4 12.6 12.6  HCT 38.1 37.2 39.1  MCV 88.2 84.7 87.1  PLT 390 414* 419*    Cardiac Enzymes: No results found for this basename: CKTOTAL, CKMB, CKMBINDEX, TROPONINI,  in the last 168 hours  Lipid Panel: No results found for this basename: CHOL, TRIG, HDL, CHOLHDL, VLDL, LDLCALC,  in the last 168 hours  CBG: No results found for this basename: GLUCAP,  in the last 168 hours  Microbiology: Results for orders placed during the hospital encounter of 03/01/14  CULTURE, BLOOD (ROUTINE X 2)     Status: None   Collection Time    03/01/14  4:15 AM      Result Value Ref Range Status   Specimen Description BLOOD LEFT ANTECUBITAL   Final   Special Requests BOTTLES DRAWN AEROBIC ONLY 10CC   Final   Culture  Setup Time     Final   Value: 03/01/2014 08:38     Performed at [facility]  Final   Value:        BLOOD CULTURE RECEIVED NO GROWTH TO DATE CULTURE WILL BE HELD FOR 5 DAYS BEFORE ISSUING A FINAL NEGATIVE REPORT     Performed at [facility]   Report Status PENDING   Incomplete  CULTURE, BLOOD (ROUTINE X 2)     Status: None   Collection Time    03/01/14  4:37 AM      Result Value Ref Range Status   Specimen Description BLOOD HAND RIGHT   Final   Special Requests BOTTLES DRAWN AEROBIC ONLY 5CC   Final   Culture  Setup Time     Final   Value: 03/01/2014 08:38     Performed at [facility]   Culture     Final   Value:        BLOOD CULTURE RECEIVED NO GROWTH TO DATE CULTURE WILL BE HELD FOR 5 DAYS BEFORE ISSUING A  FINAL NEGATIVE REPORT     Performed at [facility]   Report Status PENDING   Incomplete  CSF CULTURE     Status: None   Collection Time    03/01/14  3:00 PM      Result Value Ref Range Status   Specimen Description CSF   Final   Special Requests 3CC   Final   Gram Stain     Final   Value: WBC PRESENT, PREDOMINANTLY MONONUCLEAR     NO ORGANISMS SEEN     CYTOSPIN Performed at [facility]     Performed at [facility]   Culture     Final   Value: NO GROWTH 1 DAY     Performed at [facility]   Report Status PENDING   Incomplete  GRAM STAIN     Status: None   Collection Time    03/01/14  3:19 PM      Result Value Ref Range Status   Specimen Description CSF   Final   Special Requests NONE 3CC   Final   Gram Stain     Final   Value: CYTOSPIN PREP     WBC PRESENT, PREDOMINANTLY MONONUCLEAR     NO ORGANISMS SEEN   Report Status 03/01/2014 FINAL   Final    Coagulation Studies: No results found for this basename: LABPROT, INR,  in the last 72 hours  Imaging: Mr Kizzie Fantasia Contrast  03/02/2014   CLINICAL DATA:  43 year old female with severe headache, worsening. Query viral meningitis. Initial encounter.  EXAM: MRI HEAD WITHOUT AND WITH CONTRAST  TECHNIQUE: Multiplanar, multiecho pulse sequences of the brain and surrounding structures were obtained without and with intravenous contrast.  CONTRAST:  42mL MULTIHANCE GADOBENATE DIMEGLUMINE 529 MG/ML IV SOLN  COMPARISON:  Head CT without contrast 02/27/2014. [name] brain MRI 01/28/2008.  FINDINGS: Major intracranial vascular flow voids are stable. Cerebral volume is normal. No restricted diffusion to suggest acute infarction. No midline shift, mass effect, evidence of mass lesion, ventriculomegaly, extra-axial collection or acute intracranial hemorrhage. Cervicomedullary junction and pituitary are within normal limits.  No abnormal enhancement identified. Negative visualized cervical spine.  Pearline Cables  and white matter signal is within normal limits throughout the brain.  Visible internal auditory structures appear normal. Trace right mastoid fluid, new since 2009. Stable and negative visualized nasopharynx. Other Visualized paranasal sinuses and mastoids are clear. Visualized orbit soft tissues are within normal limits. Visualized scalp soft tissues are within normal limits.  IMPRESSION: 1. Stable and  normal MRI appearance of the brain. Note that Brain MRI is generally only positive in cases of severe/complicated meningitis. 2. Trace right mastoid effusion.   Electronically Signed   By: Lars Pinks M.D.   On: 03/02/2014 16:45    Medications:  I have reviewed the patient's current medications. Scheduled: . heparin  5,000 Units Subcutaneous 3 times per day  . pantoprazole  40 mg Oral Daily  . sodium chloride  3 mL Intravenous Q12H    Assessment/Plan: Patient improved.  Herpes titer and cultures are negative.  RMSF titer is normal as well.  Patient without new complaints.    Recommendations: 1.  No further neurological intervention recommended at this time.  Agree with discharge.   LOS: 3 days   Alexis Goodell, MD [facility] [phone] 03/04/2014  12:05 PM

## 2014-03-06 ENCOUNTER — Telehealth: Payer: Self-pay

## 2014-03-06 DIAGNOSIS — D72829 Elevated white blood cell count, unspecified: Secondary | ICD-10-CM

## 2014-03-06 LAB — CSF CULTURE W GRAM STAIN

## 2014-03-06 LAB — CSF CULTURE: CULTURE: NO GROWTH

## 2014-03-06 NOTE — Telephone Encounter (Signed)
Admit date: 03/01/2014  Discharge date: 03/04/2014  Reason for admission:  Headache---Viral meningitis  Called both numbers listed.  No answer.  Unable to leave a voice message.  Mailbox full.

## 2014-03-07 LAB — CULTURE, BLOOD (ROUTINE X 2)
CULTURE: NO GROWTH
CULTURE: NO GROWTH

## 2014-03-07 NOTE — Telephone Encounter (Signed)
Admit date: 03/01/2014  Discharge date: 03/04/2014   Reason for admission: Headache---Viral meningitis  Transition Care Management Follow-up Telephone Call  How have you been since you were released from the hospital?  Continues to have head and neck pain.  Described as burning sensation.  Feels shaky at times. States that she's eating some and staying hydrated.  She still feels weak and had trouble sleeping last night due to not being able to get cool.  Unsure of whether or not she has a fever.  She is taking tylenol and ibuprofen.      Do you understand why you were in the hospital? yes   Do you understand the discharge instrcutions? yes  Items Reviewed:  Medications reviewed: yes  Allergies reviewed: yes  Dietary changes reviewed: yes  Referrals reviewed: yes, neurology referral?  Referral not in chart.     Functional Questionnaire:   Activities of Daily Living (ADLs):   She states they are independent in the following: all ADLs  States they require assistance with the following: none   Any transportation issues/concerns?: she may have to have someone drive her to her appointment.  She was encouraged to do so.     Any patient concerns? no   Confirmed importance and date/time of follow-up visits scheduled: yes   Confirmed with patient if condition begins to worsen call PCP or go to the ER.  Patient was given the [place] line [phone]: yes  Hospital follow up appointment scheduled for 03/09/14 at 11:30 am with Mackie Pai, PA-C.

## 2014-03-09 ENCOUNTER — Encounter: Payer: Self-pay | Admitting: Medical

## 2014-03-09 ENCOUNTER — Ambulatory Visit (INDEPENDENT_AMBULATORY_CARE_PROVIDER_SITE_OTHER): Payer: BC Managed Care – PPO | Admitting: Medical

## 2014-03-09 VITALS — BP 128/86 | HR 107 | Temp 98.7°F | Ht 65.5 in | Wt 207.0 lb

## 2014-03-09 DIAGNOSIS — R509 Fever, unspecified: Secondary | ICD-10-CM | POA: Insufficient documentation

## 2014-03-09 DIAGNOSIS — R232 Flushing: Secondary | ICD-10-CM | POA: Insufficient documentation

## 2014-03-09 DIAGNOSIS — B029 Zoster without complications: Secondary | ICD-10-CM | POA: Insufficient documentation

## 2014-03-09 DIAGNOSIS — B379 Candidiasis, unspecified: Secondary | ICD-10-CM | POA: Insufficient documentation

## 2014-03-09 DIAGNOSIS — N951 Menopausal and female climacteric states: Secondary | ICD-10-CM

## 2014-03-09 DIAGNOSIS — A879 Viral meningitis, unspecified: Secondary | ICD-10-CM

## 2014-03-09 LAB — CBC WITH DIFFERENTIAL/PLATELET
BASOS ABS: 0 10*3/uL (ref 0.0–0.1)
BASOS PCT: 0.3 % (ref 0.0–3.0)
Eosinophils Absolute: 0.3 10*3/uL (ref 0.0–0.7)
Eosinophils Relative: 2.6 % (ref 0.0–5.0)
HCT: 41.8 % (ref 36.0–46.0)
Hemoglobin: 13.9 g/dL (ref 12.0–15.0)
LYMPHS PCT: 20.5 % (ref 12.0–46.0)
Lymphs Abs: 2.6 10*3/uL (ref 0.7–4.0)
MCHC: 33.4 g/dL (ref 30.0–36.0)
MCV: 85.9 fl (ref 78.0–100.0)
MONOS PCT: 5.4 % (ref 3.0–12.0)
Monocytes Absolute: 0.7 10*3/uL (ref 0.1–1.0)
NEUTROS ABS: 9.1 10*3/uL — AB (ref 1.4–7.7)
Neutrophils Relative %: 71.2 % (ref 43.0–77.0)
Platelets: 437 10*3/uL — ABNORMAL HIGH (ref 150.0–400.0)
RBC: 4.86 Mil/uL (ref 3.87–5.11)
RDW: 14.6 % (ref 11.5–15.5)
WBC: 12.8 10*3/uL — ABNORMAL HIGH (ref 4.0–10.5)

## 2014-03-09 MED ORDER — CYCLOBENZAPRINE HCL 10 MG PO TABS: 10.0000 mg | ORAL_TABLET | Freq: Three times a day (TID) | ORAL | Status: DC | PRN

## 2014-03-09 MED ORDER — CYCLOBENZAPRINE HCL 5 MG PO TABS: 5.0000 mg | ORAL_TABLET | Freq: Three times a day (TID) | ORAL | Status: DC | PRN

## 2014-03-09 MED ORDER — FLUCONAZOLE 150 MG PO TABS: 150.0000 mg | ORAL_TABLET | Freq: Once | ORAL | Status: DC

## 2014-03-09 MED ORDER — FAMCICLOVIR 500 MG PO TABS: 500.0000 mg | ORAL_TABLET | Freq: Three times a day (TID) | ORAL | Status: DC

## 2014-03-09 MED ORDER — DICLOFENAC SODIUM 75 MG PO TBEC: 75.0000 mg | DELAYED_RELEASE_TABLET | Freq: Two times a day (BID) | ORAL | Status: DC

## 2014-03-09 NOTE — Assessment & Plan Note (Signed)
Rx Diflucan for yeast infection

## 2014-03-09 NOTE — Assessment & Plan Note (Addendum)
She reports some subjective fever but no associated infectious symptoms. Did decide to go ahead and do a CBC today and check her WBC. Patient asked to notify us if she has any new signs and symptoms indicating infection. And please note the area on her back is not warm to touch nor is it tender presently. Does not appear cellulitis like.

## 2014-03-09 NOTE — Assessment & Plan Note (Signed)
This area on her left thorax is not classic for shingles but the area did a route 2 days ago and therefore I want her to start and famvir today and notify us if the area changes or worsens.

## 2014-03-09 NOTE — Assessment & Plan Note (Signed)
She was in for followup and doing well. I did go and make a referral to neurology and hopefully she will see them  within the next 2-3 weeks.

## 2014-03-09 NOTE — Assessment & Plan Note (Signed)
Patient today has tension headache type features and prescribe diclofenac. Also prescription of Flexeril given. If her headache features change more migraine-like she does have prescription of Imitrex available at the pharmacy. She will use that as directed. I did go ahead and make a referral to neurology since he had recent meningitis and was hospitalized.

## 2014-03-09 NOTE — Patient Instructions (Signed)
For you recent severe ha and possible meningitis, I will refer you to neurology. If you have increasing severe ha during interim let us know. Any severe as before with neck stiffness then ED evaluation.  Your ha now seem tension type but some of your features when I last saw you seemed migraine like. Currently, I want you to take diclofenac rx nsaid and cyclobenzaprene muscle relaxant. Use imitrex for severe ha with light or sound sensitivity.  Your rt side posterior thorax rash has some features suspicious for shingles so will rx famvir. Start today since you are 48 hours into onset of rash. If rash worsens or changes notify us.  For your yeast infection, I am prescribing diflucan.  For your subjective fever, I will get cbc today. Also since you describe some sweating and only one ovary I will also get fsh today.  Follow up in 2-3 weeks or as needed.

## 2014-03-09 NOTE — Progress Notes (Signed)
   Subjective:    Patient ID: Lorraine Conrad, female    DOB: 03-29-1971, 43 y.o.   MRN: [id number]  HPI  Pt in for follow up from ha and hospitalization. Pt states they though viral meningitis. Pt states no longer severe level ha. She has off and on level 3 ha since discharge. No reported light sensitivity or sound sensitivity. Pt has some back lt side occiptial region pain today. No nausea, no vomiting. No gross motor or sesnory function defictis.  Since dc ha every day mild ha level 3-4. Pain occipital at times. Other times higher up top of scalp. No gross motor or sensory function deficits. Lamonte Sakai will respond to otc nsaids and subsides after brief period.  2 days ago mild itching to skin lt side thorax. Then red area came up. Pt put an icepack. Then hydrocortisone.   Yeast infection- itching and discharge.  Subjective fever. No obvious infection signs and symptoms.   Hot flashes maybe recently.     Review of Systems  Constitutional: Positive for fatigue. Negative for fever, chills and diaphoresis.       Mild slight fatigue since dc.  Respiratory: Negative for cough, choking, chest tightness, shortness of breath and wheezing.   Cardiovascular: Negative for chest pain and palpitations.  Gastrointestinal: Negative.   Genitourinary: Negative.   Musculoskeletal:       Lt trapezius region pain. Up toward posterior occipital region  Skin:       Rash on left side thorax over latisumuss dorsi area.  Neurological: Positive for headaches. Negative for dizziness, tremors, seizures, syncope, facial asymmetry, speech difficulty, light-headedness and numbness.       Minimal faint presently since this am.  Hematological: Negative for adenopathy. Does not bruise/bleed easily.  Psychiatric/Behavioral: Negative for suicidal ideas, behavioral problems, sleep disturbance, dysphoric mood, decreased concentration and agitation. The patient is not nervous/anxious.        Objective:   Physical  Exam  General Mental Status- Alert. General Appearance- Not in acute distress.   Skin General: Color- Normal Color. Moisture- Normal Moisture.  Neck Carotid Arteries- Normal color. Moisture- Normal Moisture. No carotid bruits. No JVD.  Chest and Lung Exam Auscultation: Breath Sounds:-Normal, clear even and unlabored.  Cardiovascular Auscultation:Rythm- Regular, rate and rhythm. Murmurs & Other Heart Sounds:Auscultation of the heart reveals- No Murmurs.  Abdomen Inspection:-Inspeection Normal. Palpation/Percussion:Note:No mass. Palpation and Percussion of the abdomen reveal- Non Tender, Non Distended + BS, no rebound or guarding.    Neurologic Cranial Nerve exam:- CN III-XII intact(No nystagmus), symmetric smile. Drift Test:- No drift. Romberg Exam:- Negative.  Finger to Nose:- Normal/Intact Strength:- 5/5 equal and symmetric strength both upper and lower extremities.  Skin- Lt latisimuss  4cm x 2 cm faint red area with mild scattered appearance of vesicles. Area is not warm or tender to palpation.  Neck- from, no nuccal rigidity. Lt trapezius is tender to palpation. Where inserts into occipital area.      Assessment & Plan:

## 2014-03-09 NOTE — Assessment & Plan Note (Signed)
Patient also states she'll break out in a cold sweat intermittently. As stated I am checking her CBC/WBC. Patient does only have one ovary and based on her description that decided to go ahead and get an [facility] today.

## 2014-03-10 LAB — FOLLICLE STIMULATING HORMONE: FSH: 14 m[IU]/mL

## 2014-03-12 NOTE — Telephone Encounter (Signed)
Future lab in one week. Cbc.

## 2014-03-15 LAB — EHRLICHIA ANTIBODY PANEL: E chaffeensis (HGE) Ab, IgM: <1:20 {titer}

## 2014-03-20 ENCOUNTER — Telehealth: Payer: Self-pay | Admitting: Family Medicine

## 2014-03-20 ENCOUNTER — Other Ambulatory Visit (INDEPENDENT_AMBULATORY_CARE_PROVIDER_SITE_OTHER): Payer: BC Managed Care – PPO

## 2014-03-20 DIAGNOSIS — D72829 Elevated white blood cell count, unspecified: Secondary | ICD-10-CM

## 2014-03-20 LAB — CBC WITH DIFFERENTIAL/PLATELET
BASOS PCT: 0.3 % (ref 0.0–3.0)
Basophils Absolute: 0 10*3/uL (ref 0.0–0.1)
EOS PCT: 4.6 % (ref 0.0–5.0)
Eosinophils Absolute: 0.4 10*3/uL (ref 0.0–0.7)
HCT: 38.6 % (ref 36.0–46.0)
HEMOGLOBIN: 12.5 g/dL (ref 12.0–15.0)
LYMPHS PCT: 23.7 % (ref 12.0–46.0)
Lymphs Abs: 2.1 10*3/uL (ref 0.7–4.0)
MCHC: 32.4 g/dL (ref 30.0–36.0)
MCV: 86.8 fl (ref 78.0–100.0)
Monocytes Absolute: 0.5 10*3/uL (ref 0.1–1.0)
Monocytes Relative: 5.5 % (ref 3.0–12.0)
Neutro Abs: 5.8 10*3/uL (ref 1.4–7.7)
Neutrophils Relative %: 65.9 % (ref 43.0–77.0)
Platelets: 385 10*3/uL (ref 150.0–400.0)
RBC: 4.45 Mil/uL (ref 3.87–5.11)
RDW: 14.5 % (ref 11.5–15.5)
WBC: 8.7 10*3/uL (ref 4.0–10.5)

## 2014-03-20 NOTE — Telephone Encounter (Signed)
Advised patient per note that she needs to be seen in office due to recent Meningitis. Patient scheduled with ES on 03/27/14. Advised patient to go to ER if symptoms worsen before then. States she would prefer to see him since he knows what is going on with her.

## 2014-03-20 NOTE — Telephone Encounter (Signed)
Advising as Lorraine Conrad is out of office.  I have never seen patient.  Usually ringing in the ears can be from a vitamin deficiency or fluid behind ears.  Can also be from other issues.  I recommend she come in for assessment due to her history of recent viral meningitis, unless she is seeing the Neurologist in the next day or so.

## 2014-03-20 NOTE — Telephone Encounter (Signed)
Pt states that she has finished all of her medication however she has a ringing in her ears, mostly the left ear, pt would like to know if this is something she should be concerned about. Pt states she is still taking the meds for inflammation. Pt has been seeing edward.

## 2014-03-20 NOTE — Telephone Encounter (Signed)
[place] returned phone call

## 2014-03-27 ENCOUNTER — Ambulatory Visit (INDEPENDENT_AMBULATORY_CARE_PROVIDER_SITE_OTHER): Payer: BC Managed Care – PPO | Admitting: Medical

## 2014-03-27 ENCOUNTER — Telehealth: Payer: Self-pay | Admitting: Family Medicine

## 2014-03-27 ENCOUNTER — Encounter: Payer: Self-pay | Admitting: Medical

## 2014-03-27 VITALS — BP 118/87 | HR 128 | Temp 98.4°F | Ht 65.5 in | Wt 211.4 lb

## 2014-03-27 DIAGNOSIS — R5382 Chronic fatigue, unspecified: Secondary | ICD-10-CM

## 2014-03-27 DIAGNOSIS — R300 Dysuria: Secondary | ICD-10-CM

## 2014-03-27 DIAGNOSIS — R739 Hyperglycemia, unspecified: Secondary | ICD-10-CM

## 2014-03-27 DIAGNOSIS — M549 Dorsalgia, unspecified: Secondary | ICD-10-CM | POA: Insufficient documentation

## 2014-03-27 DIAGNOSIS — H9319 Tinnitus, unspecified ear: Secondary | ICD-10-CM | POA: Insufficient documentation

## 2014-03-27 DIAGNOSIS — R5383 Other fatigue: Secondary | ICD-10-CM | POA: Insufficient documentation

## 2014-03-27 DIAGNOSIS — H9313 Tinnitus, bilateral: Secondary | ICD-10-CM

## 2014-03-27 DIAGNOSIS — M546 Pain in thoracic spine: Secondary | ICD-10-CM

## 2014-03-27 LAB — COMPREHENSIVE METABOLIC PANEL
ALT: 33 U/L (ref 0–35)
AST: 28 U/L (ref 0–37)
Albumin: 3.6 g/dL (ref 3.5–5.2)
Alkaline Phosphatase: 82 U/L (ref 39–117)
BUN: 7 mg/dL (ref 6–23)
CALCIUM: 9.7 mg/dL (ref 8.4–10.5)
CO2: 26 meq/L (ref 19–32)
Chloride: 100 mEq/L (ref 96–112)
Creatinine, Ser: 0.8 mg/dL (ref 0.4–1.2)
GFR: 79.15 mL/min (ref >60.00)
GLUCOSE: 94 mg/dL (ref 70–99)
POTASSIUM: 4.1 meq/L (ref 3.5–5.1)
Sodium: 137 mEq/L (ref 135–145)
Total Bilirubin: 0.4 mg/dL (ref 0.2–1.2)
Total Protein: 8.1 g/dL (ref 6.0–8.3)

## 2014-03-27 LAB — CBC WITH DIFFERENTIAL/PLATELET
BASOS PCT: 0.4 % (ref 0.0–3.0)
Basophils Absolute: 0 10*3/uL (ref 0.0–0.1)
EOS PCT: 3.3 % (ref 0.0–5.0)
Eosinophils Absolute: 0.4 10*3/uL (ref 0.0–0.7)
HCT: 43 % (ref 36.0–46.0)
Hemoglobin: 14.1 g/dL (ref 12.0–15.0)
LYMPHS PCT: 20.8 % (ref 12.0–46.0)
Lymphs Abs: 2.3 10*3/uL (ref 0.7–4.0)
MCHC: 32.9 g/dL (ref 30.0–36.0)
MCV: 86.2 fl (ref 78.0–100.0)
Monocytes Absolute: 0.7 10*3/uL (ref 0.1–1.0)
Monocytes Relative: 6.6 % (ref 3.0–12.0)
Neutro Abs: 7.5 10*3/uL (ref 1.4–7.7)
Neutrophils Relative %: 68.9 % (ref 43.0–77.0)
Platelets: 502 10*3/uL — ABNORMAL HIGH (ref 150.0–400.0)
RBC: 4.98 Mil/uL (ref 3.87–5.11)
RDW: 14.9 % (ref 11.5–15.5)
WBC: 10.9 10*3/uL — AB (ref 4.0–10.5)

## 2014-03-27 LAB — POCT URINALYSIS DIPSTICK
Bilirubin, UA: NEGATIVE
Blood, UA: NEGATIVE
Glucose, UA: NEGATIVE
KETONES UA: NEGATIVE
LEUKOCYTES UA: NEGATIVE
Nitrite, UA: NEGATIVE
SPEC GRAV UA: 1.01
Urobilinogen, UA: 0.2
pH, UA: 5

## 2014-03-27 LAB — HEMOGLOBIN A1C: Hgb A1c MFr Bld: 5.4 % (ref 4.6–6.5)

## 2014-03-27 MED ORDER — DICLOFENAC SODIUM 75 MG PO TBEC: 75.0000 mg | DELAYED_RELEASE_TABLET | Freq: Two times a day (BID) | ORAL | Status: DC

## 2014-03-27 MED ORDER — CIPROFLOXACIN HCL 250 MG PO TABS: 250.0000 mg | ORAL_TABLET | Freq: Two times a day (BID) | ORAL | Status: DC

## 2014-03-27 NOTE — Progress Notes (Signed)
Pre visit review using our clinic review tool, if applicable. No additional management support is needed unless otherwise documented below in the visit note. 

## 2014-03-27 NOTE — Progress Notes (Signed)
Subjective:    Patient ID: Lorraine Conrad, female    DOB: December 13, 1970, 43 y.o.   MRN: [id number]  HPI  Pt in states she had some ringing in both ears for about weeks. She states history of chronic nasal congestion since youth and had sinus surgery in the past. Pt states ringing is coming and going and sporadic.But 2 weeks ago is constant.  Pt neurology appointment is 1st week of November. Her HA are less frequent and less severe. Last one she had was yesterday and mild. No gross motor or sensory function deficits.  Pt states tires easily since dc from hospital. Mild wbc elevation on last visit. In hospital and low na and mild high bs.  Pt states yesterday pain on urination. Pt states she has some intersititial cystitis. Told some IC November 2014. Pt told this by 2 specialist.    Sunday during day mid thoracic back pain. Worst last night. No fall or trauma. No rash. Was bowling on Friday. Pt states when she takes nsaid I gave her in the past it helps.    Past Medical History  Diagnosis Date  . GERD (gastroesophageal reflux disease)   . Endometriosis   . Ovarian cyst, left   . External hemorrhoids   . Erosive gastritis   . Proctalgia fugax   . Allergy     SEASONAL  . Anemia   . OSA on CPAP   . Migraine 1998; 02/2014    "this one's lasted 10 days straight" (03/01/2014)  . Headache     "probably monthly" (03/01/2014)  . Arthritis     "spine" (03/01/2014)  . Chronic lower back pain     History   Social History  . Marital Status: Married    Spouse Name: N/A    Number of Children: N/A  . Years of Education: N/A   Occupational History  . Not on file.   Social History Main Topics  . Smoking status: Never Smoker   . Smokeless tobacco: Never Used  . Alcohol Use: Yes     Comment: 03/01/2014 "I'll have 1-2 drinks maybe 3-4 times/yr"  . Drug Use: No  . Sexual Activity: Yes   Other Topics Concern  . Not on file   Social History Narrative   Daily caffiene use: 6 cups per day     Patient does not get regular excercise    Past Surgical History  Procedure Laterality Date  . Cesarean section  2002; 2006  . Temporomandibular joint surgery  ~ 1992  . Nasal septum surgery  ~ 1991  . Laparoscopic ovarian cystectomy  ~ 1999  . Upper gastrointestinal endoscopy    . Colonoscopy    . Abdominal hysterectomy  04/2013  . Breast biopsy Bilateral   . Breast lumpectomy Left ~ 1993    Family History  Problem Relation Age of Onset  . Breast cancer Maternal Grandmother   . Colon cancer Neg Hx   . Prostate cancer Paternal Grandfather   . Colon polyps Father   . Heart disease Father     Allergies  Allergen Reactions  . Promethazine Hcl Other (See Comments)    Violent tremors   . Codeine Other (See Comments)    Was a child when she took this.  . Dilaudid [Hydromorphone] Nausea And Vomiting  . Azithromycin Rash    Head to toe rash    Current Outpatient Prescriptions on File Prior to Visit  Medication Sig Dispense Refill  . acetaminophen (TYLENOL) 500 MG tablet  Take 1 tablet (500 mg total) by mouth every 6 (six) hours as needed for mild pain or headache. For pain  30 tablet  0  . cyclobenzaprine (FLEXERIL) 10 MG tablet Take 1 tablet (10 mg total) by mouth 3 (three) times daily as needed for muscle spasms.  30 tablet  0  . cyclobenzaprine (FLEXERIL) 5 MG tablet Take 1 tablet (5 mg total) by mouth 3 (three) times daily as needed for muscle spasms.  30 tablet  1  . famciclovir (FAMVIR) 500 MG tablet Take 1 tablet (500 mg total) by mouth 3 (three) times daily.  21 tablet  0  . fluconazole (DIFLUCAN) 150 MG tablet Take 1 tablet (150 mg total) by mouth once.  1 tablet  0  . ibuprofen (ADVIL,MOTRIN) 200 MG tablet Take 400 mg by mouth every 6 (six) hours as needed for pain. For pain      . lansoprazole (PREVACID) 30 MG capsule Take 30 mg by mouth daily at 12 noon.      . ondansetron (ZOFRAN-ODT) 4 MG disintegrating tablet Take 4 mg by mouth every 8 (eight) hours as needed for  nausea or vomiting.       No current facility-administered medications on file prior to visit.    BP 118/87  Pulse 128  Temp(Src) 98.4 F (36.9 C) (Oral)  Ht 5' 5.5" (1.664 m)  Wt 211 lb 6.4 oz (95.89 kg)  BMI 34.63 kg/m2  SpO2 99%  LMP 02/08/2013     Review of Systems  Constitutional: Positive for fatigue. Negative for fever and chills.  HENT: Positive for congestion and tinnitus.        Chronic but none recently but history of.  Eyes: Negative.   Respiratory: Negative for cough, choking, chest tightness and wheezing.   Cardiovascular: Negative for chest pain and palpitations.  Gastrointestinal: Negative.   Genitourinary: Positive for dysuria. Negative for urgency, frequency, flank pain, difficulty urinating and pelvic pain.  Musculoskeletal: Positive for back pain.  Neurological: Positive for headaches. Negative for dizziness, tremors, seizures, syncope, facial asymmetry, speech difficulty, weakness and numbness.  Hematological: Negative for adenopathy. Does not bruise/bleed easily.  Psychiatric/Behavioral:       None reported today.       Objective:   Physical Exam  General Mental Status- Alert. General Appearance- Not in acute distress.   Skin General: Color- Normal Color. Moisture- Normal Moisture.  Neck Carotid Arteries- Normal color. Moisture- Normal Moisture. No carotid bruits. No JVD.  Chest and Lung Exam Auscultation: Breath Sounds:-Normal, clear even and unlabored.  Cardiovascular Auscultation:Rythm- Regular, rate and rhythm. Murmurs & Other Heart Sounds:Auscultation of the heart reveals- No Murmurs.  Abdomen Inspection:-Inspeection Normal. Palpation/Percussion:Note:No mass. Palpation and Percussion of the abdomen reveal- Non Tender, Non Distended + BS, no rebound or guarding. But some midline suprapubic tenderness to palpation.  Back- mild mid tspine tenderness to palpation.    Neurologic Cranial Nerve exam:- CN III-XII intact(No  nystagmus), symmetric smile. Drift Test:- No drift. Romberg Exam:- Negative.  Heal to Toe Gait exam:-Normal. Finger to Nose:- Normal/Intact Strength:- 5/5 equal and symmetric strength both upper and lower extremities.      Assessment & Plan:

## 2014-03-27 NOTE — Telephone Encounter (Signed)
Caller name: Elois Relation to pt: self Call back number: [phone] Pharmacy:  Reason for call:   Patient wants to know if edward could send in a 15 day supply instead of 7 days of diclofenac since she will be out of town next week??

## 2014-03-27 NOTE — Assessment & Plan Note (Signed)
This is the case post dc from hospital. I am repeating her cbc to check her wbc that have been mild elevated with no obvious source of infection. Her na was low in hospital and her bs was high so I am including cmp and a1-c.

## 2014-03-27 NOTE — Patient Instructions (Addendum)
Your ringing of ears has almost stopped. If it persists or worsens let us know.  By exam you have some nasal congestion(possible allergies). I recommend fluticasone otc nasal spray.  Keep appointment with your neurologist. If you ha features worsen or change notify us. If severe then ED evaluation.  I will repeat cbc, and cmp for your fatigue. Since recent wbc elevation and low na in hospital. Will get a1-c to check your bs average since bs was high in hospital as well.  I am sending your your urine for culture. I am making cipro available if symptoms worsen pending urine culture.  For your back pain, you can take diclofenac for strain. But if pain exceeds 7 more days then recommend t-spine xray.  After hour note(Called and discussed with) Pt has some tachycardia when lpn checked but when I ausculated rate did not sound fast. I called pt and discussed this with her. She is asymptomatic. I asked her to come in on Thursday or sooner. She has appointment scheduled for Thursday and that was not cancelled. So she will keep and come in. She had mild tachycardia on last visit. So I think best to go ahead and get ekg.

## 2014-03-27 NOTE — Assessment & Plan Note (Signed)
Some today but also some history of IC per pt. Her urine does not look real suspicious but has some suprapubic pressure. So rx of cipro to start in event worsens prior to urine culture results.

## 2014-03-27 NOTE — Assessment & Plan Note (Signed)
Recent and started after bowling.(So probable strain injury) So did advise rest and diclofenac. If pain last more than 7 more days then get xray of tspine. Patient states noticed improving moderately already and diclofenac will almost stop pain completely.

## 2014-03-27 NOTE — Assessment & Plan Note (Signed)
Stable and less frequent but still has at times. Intensity much less. Pt has follow up neurologist end of November.

## 2014-03-27 NOTE — Assessment & Plan Note (Signed)
Much improved now than when started. If this persists then refer to ENT. Note this is not associated with HA. Pt did have imaging studies in hospital and studies were negative.

## 2014-03-28 ENCOUNTER — Telehealth: Payer: Self-pay | Admitting: Medical

## 2014-03-28 LAB — URINE CULTURE
COLONY COUNT: NO GROWTH
Organism ID, Bacteria: NO GROWTH

## 2014-03-28 MED ORDER — DICLOFENAC SODIUM 75 MG PO TBEC: 75.0000 mg | DELAYED_RELEASE_TABLET | Freq: Two times a day (BID) | ORAL | Status: DC

## 2014-03-28 NOTE — Telephone Encounter (Signed)
Called patient with lab results. States she will be in tomorrow for ECG.

## 2014-03-28 NOTE — Telephone Encounter (Signed)
Rx more diclofenac. For the week she will be out of town.

## 2014-03-29 ENCOUNTER — Telehealth: Payer: Self-pay | Admitting: Family Medicine

## 2014-03-29 ENCOUNTER — Encounter: Payer: Self-pay | Admitting: Medical

## 2014-03-29 ENCOUNTER — Ambulatory Visit (INDEPENDENT_AMBULATORY_CARE_PROVIDER_SITE_OTHER): Payer: BC Managed Care – PPO | Admitting: Medical

## 2014-03-29 VITALS — BP 132/92 | HR 116 | Temp 98.0°F | Ht 65.5 in | Wt 213.4 lb

## 2014-03-29 DIAGNOSIS — R000 Tachycardia, unspecified: Secondary | ICD-10-CM | POA: Insufficient documentation

## 2014-03-29 NOTE — Assessment & Plan Note (Signed)
Recent on last few visits some symptoms yesterday associated with but now none at all. I did not think current ekg and lack of symptoms warranted any blood work. I think this may be excess caffeine intake related. Explained to decrease caffeine intake significantly and I would go ahead and set up cardiologist referral for possible holter. i was considering beta blocker up until I found out how much caffeine she drinks. If any chest pain prior to cardio referral with any left arm pain then advised pt ED evaluation. She expressed understanding and agreed.

## 2014-03-29 NOTE — Progress Notes (Signed)
Subjective:    Patient ID: Lorraine Conrad, female    DOB: [birth year], 43 y.o.   MRN: [id number]  HPI   Pt in for follow up on her tachycardia. She had slight low grade increase pulse on prior exams. When I reviewed her notes the other day, I asked her to come in for ekg. Last night rolling in bed she could feel her heart race. Now she just feels jittery.  Pt has no chest pain or sob now. She feels ok now.  Pt felt off and on band across her lower rib area yesterday on and off. Pt thought maybe missing reflux medicine caused her symptoms. Yesterday with her lower rib region pain she had some transient pain in her left arm. But that is gone now.  Note at end of exam she admitted to drinking 4 cokes a day and a lot of ice tea.  Past Medical History  Diagnosis Date  . GERD (gastroesophageal reflux disease)   . Endometriosis   . Ovarian cyst, left   . External hemorrhoids   . Erosive gastritis   . Proctalgia fugax   . Allergy     SEASONAL  . Anemia   . OSA on CPAP   . Migraine 1998; 02/2014    "this one's lasted 10 days straight" (03/01/2014)  . Headache     "probably monthly" (03/01/2014)  . Arthritis     "spine" (03/01/2014)  . Chronic lower back pain     History   Social History  . Marital Status: Married    Spouse Name: N/A    Number of Children: N/A  . Years of Education: N/A   Occupational History  . Not on file.   Social History Main Topics  . Smoking status: Never Smoker   . Smokeless tobacco: Never Used  . Alcohol Use: Yes     Comment: 03/01/2014 "I'll have 1-2 drinks maybe 3-4 times/yr"  . Drug Use: No  . Sexual Activity: Yes   Other Topics Concern  . Not on file   Social History Narrative   Daily caffiene use: 6 cups per day   Patient does not get regular excercise    Past Surgical History  Procedure Laterality Date  . Cesarean section  2002; 2006  . Temporomandibular joint surgery  ~ 1992  . Nasal septum surgery  ~ 1991  . Laparoscopic ovarian  cystectomy  ~ 1999  . Upper gastrointestinal endoscopy    . Colonoscopy    . Abdominal hysterectomy  04/2013  . Breast biopsy Bilateral   . Breast lumpectomy Left ~ 1993    Family History  Problem Relation Age of Onset  . Breast cancer Maternal Grandmother   . Colon cancer Neg Hx   . Prostate cancer Paternal Grandfather   . Colon polyps Father   . Heart disease Father     Allergies  Allergen Reactions  . Promethazine Hcl Other (See Comments)    Violent tremors   . Codeine Other (See Comments)    Was a child when she took this.  . Dilaudid [Hydromorphone] Nausea And Vomiting  . Azithromycin Rash    Head to toe rash    Current Outpatient Prescriptions on File Prior to Visit  Medication Sig Dispense Refill  . acetaminophen (TYLENOL) 500 MG tablet Take 1 tablet (500 mg total) by mouth every 6 (six) hours as needed for mild pain or headache. For pain  30 tablet  0  . ciprofloxacin (CIPRO) 250 MG tablet Take  1 tablet (250 mg total) by mouth 2 (two) times daily.  6 tablet  0  . cyclobenzaprine (FLEXERIL) 10 MG tablet Take 1 tablet (10 mg total) by mouth 3 (three) times daily as needed for muscle spasms.  30 tablet  0  . cyclobenzaprine (FLEXERIL) 5 MG tablet Take 1 tablet (5 mg total) by mouth 3 (three) times daily as needed for muscle spasms.  30 tablet  1  . diclofenac (VOLTAREN) 75 MG EC tablet Take 1 tablet (75 mg total) by mouth 2 (two) times daily.  30 tablet  0  . diclofenac (VOLTAREN) 75 MG EC tablet Take 1 tablet (75 mg total) by mouth 2 (two) times daily.  30 tablet  0  . famciclovir (FAMVIR) 500 MG tablet Take 1 tablet (500 mg total) by mouth 3 (three) times daily.  21 tablet  0  . fluconazole (DIFLUCAN) 150 MG tablet Take 1 tablet (150 mg total) by mouth once.  1 tablet  0  . ibuprofen (ADVIL,MOTRIN) 200 MG tablet Take 400 mg by mouth every 6 (six) hours as needed for pain. For pain      . lansoprazole (PREVACID) 30 MG capsule Take 30 mg by mouth daily at 12 noon.      .  ondansetron (ZOFRAN-ODT) 4 MG disintegrating tablet Take 4 mg by mouth every 8 (eight) hours as needed for nausea or vomiting.       No current facility-administered medications on file prior to visit.    BP 132/92  Pulse 116  Temp(Src) 98 F (36.7 C) (Oral)  Ht 5' 5.5" (1.664 m)  Wt 213 lb 6.4 oz (96.798 kg)  BMI 34.96 kg/m2  SpO2 99%  LMP 02/08/2013     Review of Systems  Constitutional: Negative for fever, chills and fatigue.  HENT: Negative.   Respiratory: Negative for cough, choking, chest tightness, shortness of breath and wheezing.   Cardiovascular: Negative for chest pain and palpitations.       Tachycardia. Occasional symptoms.  Gastrointestinal: Negative for nausea, vomiting, abdominal pain, diarrhea, constipation, blood in stool, abdominal distention, anal bleeding and rectal pain.       Painful band across upper stomach and lower rib anterior yesterday but none today.  Musculoskeletal: Negative for back pain, gait problem, myalgias and neck pain.       Mild left arm pain yesterday but none today at all.  Hematological: Negative for adenopathy. Does not bruise/bleed easily.  Psychiatric/Behavioral:       Seems anxious today and sometimes overall demenour is anxious.       Objective:   Physical Exam  Constitutional: She is oriented to person, place, and time. She appears well-developed and well-nourished. No distress.  HENT:  Head: Normocephalic and atraumatic.  Right Ear: External ear normal.  Left Ear: External ear normal.  Eyes: Conjunctivae and EOM are normal. Pupils are equal, round, and reactive to light. Left eye exhibits no discharge.  Neck: Normal range of motion. Neck supple. No JVD present. No tracheal deviation present. No thyromegaly present.  Cardiovascular: Regular rhythm and normal heart sounds.   Mld tachycardic.  Pulmonary/Chest: Effort normal and breath sounds normal. No stridor. No respiratory distress. She has no wheezes. She has no rales.  She exhibits no tenderness.  No pain on palpation chest wall.  Abdominal: Soft. Bowel sounds are normal. She exhibits no distension and no mass. There is no tenderness. There is no rebound and no guarding.  Musculoskeletal:  No left arm pain on palpation  or rom.  Lymphadenopathy:    She has no cervical adenopathy.  Neurological: She is alert and oriented to person, place, and time. No cranial nerve deficit. Coordination normal.  Skin: Skin is warm and dry. She is not diaphoretic.  Psychiatric: She has a normal mood and affect. Her behavior is normal. Judgment and thought content normal.    .       Assessment & Plan:

## 2014-03-29 NOTE — Patient Instructions (Signed)
I will try to refer you quickly to cardiologist for evaluation(Holter monitor). You are currently asymptomatic. If you symptoms worsen as you described yesterday then ED evaluation.  Currently avoid any OTC decongestants and any caffeinated beverage.(no nicotine as well)  If appointment is delayed then I may give you a low dose medication to reduce your pulse.  Follow up with me in 3 wks.(hopefully after cardiology referral)

## 2014-03-29 NOTE — Telephone Encounter (Signed)
Caller name: Jenniferlynn Relation to pt: self Call back number: [phone] Pharmacy: [facility] on [address]  Reason for call:   Patient wants to know if edward was going to put her on a beta blocker? She also wants edward to know that after walking back to her car that her chest did start hurting(she could feel her heatbeat increase).

## 2014-03-29 NOTE — Progress Notes (Signed)
Pre visit review using our clinic review tool, if applicable. No additional management support is needed unless otherwise documented below in the visit note. 

## 2014-03-30 ENCOUNTER — Ambulatory Visit: Payer: BC Managed Care – PPO | Admitting: Medical

## 2014-03-30 ENCOUNTER — Ambulatory Visit: Payer: BC Managed Care – PPO | Admitting: Family Medicine

## 2014-03-31 MED ORDER — PROPRANOLOL HCL 20 MG PO TABS: 20.0000 mg | ORAL_TABLET | Freq: Two times a day (BID) | ORAL | Status: DC

## 2014-03-31 NOTE — Telephone Encounter (Signed)
Called patient regarding message from ES. States he would like for her to take Propanadol 20mg  bid instead of original message for qd. Patient would like to know if she could have something for pain in her shoulder blade. Flexerril not helping.

## 2014-03-31 NOTE — Telephone Encounter (Signed)
Will ask LPN to call pt. Advise will call in propanolol 20 mg q day rx. This may slow pulse little and may have prevent migraine ha. Still keep appointment with cardiologist.

## 2014-04-13 ENCOUNTER — Ambulatory Visit (INDEPENDENT_AMBULATORY_CARE_PROVIDER_SITE_OTHER): Payer: BC Managed Care – PPO | Admitting: Neurology

## 2014-04-13 ENCOUNTER — Encounter: Payer: Self-pay | Admitting: Neurology

## 2014-04-13 VITALS — BP 112/78 | HR 115 | Resp 16 | Ht 65.25 in | Wt 210.0 lb

## 2014-04-13 DIAGNOSIS — A879 Viral meningitis, unspecified: Secondary | ICD-10-CM

## 2014-04-13 DIAGNOSIS — G4452 New daily persistent headache (NDPH): Secondary | ICD-10-CM

## 2014-04-13 MED ORDER — NORTRIPTYLINE HCL 10 MG PO CAPS: ORAL_CAPSULE | ORAL | Status: DC

## 2014-04-13 NOTE — Progress Notes (Addendum)
NEUROLOGY CONSULTATION NOTE  Evanee Lubrano MRN: [id number] DOB: 07-16-1970  Referring provider: Mackie Pai, MA-C Primary care provider: Dr. Annye Asa  Reason for consult:  headaches  Thank you for your kind referral of Stefhanie Kachmar for consultation of the above symptoms. Although her history is well known to you, please allow me to reiterate it for the purpose of our medical record. Records and images were personally reviewed where available.  HISTORY OF PRESENT ILLNESS: This is a 43 year old right-handed woman with a history of migraines, tachycardia, back pain, who was admitted to [facility] last 03/01/14 after she had worsening headaches that had lasted for 10 days with associated vomiting. She had chills that week with a temperature of 101. She was diagnosed with viral meningitis, with CSF showing WBC 610, RBC 6, protein 135, glucose 53. Pathology showed increased mononuclear cells. Gram stain, culture, HSV, Lyme, RMSF, Ehrlichia,  HIV, RPR negative. MRI brain with and without contrast which I personally reviewed was normal. She received supportive care with improvement in symptoms. She denied any recent travels or sick contacts. She was concerned about a recently adopted cat. Since her hospital stay, she continues to have occipital and left parietal headaches, sometimes the inside of her ears start hurting. She reports that she has symptoms daily, with the walk from the parking lot to our office today, she has had 3 separate headaches, each lasting a few minutes. She has woken up with the chills or breathing weird. She has only vomited one more time at the beginning of the month, then had a headache the next day.  She has been taking 1-2 Tylenol daily for the past month.  She reports a history of migraines that started around age 64. She reports her migraines are different from her current headache. She would usually go to a dark room and headache improves. She has chronic back pain, and  has been evaluated at [facility] for episodes where her legs would go cold and she would be unable to walk. Records have been requested for review.   She feels much better since the hospitalization, but gets tired easily. She has noticed shakiness in her legs and hands. There is no family history of tremors. She has been taking Flexeril and Voltaren for "spinal pain," which helps with body pain but causes ankle swelling. She denies any diplopia, dysarthria, dysphagia. She has occasional tingling in her hands. She reports the neck pain has resolved. She has occasional pain on urination since her hysterectomy in November, no incontinence. She has noticed increased tinnitus since the meningitis. Her father had headaches. She has been prescribed Propranolol for tachycardia but has been scared to take this and will be seeing Cardiology at the end of the month. She reports sleep has been good with Flexeril, she has been using a CPAP for OSA for a couple of years.   At the end of the visit, she reported that 17 years ago, she had a band of numbness that went across her forehead, then started going down her face. She then had difficulties moving her legs a few years ago and had seen an MS specialist. She underwent extensive workup, a note from Dr. Diamantina Monks at [facility] in 2010 noted she had EMG/NCV, MRIs, extensive bloodwork (CBC, folate, thiamine, SPEP, ANA, ESR, B12, CK, hepatits, celiac, liver profile) which were unremarkable. According to his note, "this is certainly a rather fantastic and dramatic list of symptoms and pains and evaluations, and I  think it is unlikely there will be a unifying diagnosis, and certainly unlikely that there will be a unifying neurologic diagnosis. It is my impression that some of her symptomatology is likely due to hypervigilance, but I cannot exclude some other additional neurologic conditions." There was note of doing a skin biopsy for small fiber neuropathy, but patient did  not follow through.  Laboratory Data: Lab Results  Component Value Date   WBC 10.9* 03/27/2014   HGB 14.1 03/27/2014   HCT 43.0 03/27/2014   MCV 86.2 03/27/2014   PLT 502.0* 03/27/2014     Chemistry      Component Value Date/Time   NA 137 03/27/2014 1246   K 4.1 03/27/2014 1246   CL 100 03/27/2014 1246   CO2 26 03/27/2014 1246   BUN 7 03/27/2014 1246   CREATININE 0.8 03/27/2014 1246   CREATININE 0.95 03/18/2013 0849      Component Value Date/Time   CALCIUM 9.7 03/27/2014 1246   ALKPHOS 82 03/27/2014 1246   AST 28 03/27/2014 1246   ALT 33 03/27/2014 1246   BILITOT 0.4 03/27/2014 1246       PAST MEDICAL HISTORY: Past Medical History  Diagnosis Date  . GERD (gastroesophageal reflux disease)   . Endometriosis   . Ovarian cyst, left   . External hemorrhoids   . Erosive gastritis   . Proctalgia fugax   . Allergy     SEASONAL  . Anemia   . OSA on CPAP   . Migraine 1998; 02/2014    "this one's lasted 10 days straight" (03/01/2014)  . Headache     "probably monthly" (03/01/2014)  . Arthritis     "spine" (03/01/2014)  . Chronic lower back pain     PAST SURGICAL HISTORY: Past Surgical History  Procedure Laterality Date  . Cesarean section  2002; 2006  . Temporomandibular joint surgery  ~ 1992  . Nasal septum surgery  ~ 1991  . Laparoscopic ovarian cystectomy  ~ 1999  . Upper gastrointestinal endoscopy    . Colonoscopy    . Abdominal hysterectomy  04/2013  . Breast biopsy Bilateral   . Breast lumpectomy Left ~ 1993    MEDICATIONS: Current Outpatient Prescriptions on File Prior to Visit  Medication Sig Dispense Refill  . acetaminophen (TYLENOL) 500 MG tablet Take 1 tablet (500 mg total) by mouth every 6 (six) hours as needed for mild pain or headache. For pain 30 tablet 0  . cyclobenzaprine (FLEXERIL) 10 MG tablet Take 1 tablet (10 mg total) by mouth 3 (three) times daily as needed for muscle spasms. 30 tablet 0  . cyclobenzaprine (FLEXERIL) 5 MG tablet Take 1  tablet (5 mg total) by mouth 3 (three) times daily as needed for muscle spasms. 30 tablet 1  . lansoprazole (PREVACID) 30 MG capsule Take 30 mg by mouth daily at 12 noon.    . ondansetron (ZOFRAN-ODT) 4 MG disintegrating tablet Take 4 mg by mouth every 8 (eight) hours as needed for nausea or vomiting.    . diclofenac (VOLTAREN) 75 MG EC tablet Take 1 tablet (75 mg total) by mouth 2 (two) times daily. 30 tablet 0  . ibuprofen (ADVIL,MOTRIN) 200 MG tablet Take 400 mg by mouth every 6 (six) hours as needed for pain. For pain     No current facility-administered medications on file prior to visit.    ALLERGIES: Allergies  Allergen Reactions  . Promethazine Hcl Other (See Comments)    Violent tremors   . Codeine Other (  See Comments)    Was a child when she took this.  . Dilaudid [Hydromorphone] Nausea And Vomiting  . Azithromycin Rash    Head to toe rash    FAMILY HISTORY: Family History  Problem Relation Age of Onset  . Breast cancer Maternal Grandmother   . Colon cancer Neg Hx   . Prostate cancer Paternal Grandfather   . Colon polyps Father   . Heart disease Father     SOCIAL HISTORY: History   Social History  . Marital Status: Married    Spouse Name: N/A    Number of Children: N/A  . Years of Education: N/A   Occupational History  . Not on file.   Social History Main Topics  . Smoking status: Never Smoker   . Smokeless tobacco: Never Used  . Alcohol Use: 0.0 oz/week    0 Not specified per week     Comment: 03/01/2014 "I'll have 1-2 drinks maybe 3-4 times/yr"  . Drug Use: No  . Sexual Activity: Yes   Other Topics Concern  . Not on file   Social History Narrative   Daily caffiene use: 6 cups per day   Patient does not get regular excercise    REVIEW OF SYSTEMS: Constitutional: No fevers, chills, or sweats, no generalized fatigue, change in appetite Eyes: No visual changes, double vision, eye pain Ear, nose and throat: No hearing loss, ear pain, nasal  congestion, sore throat Cardiovascular: No chest pain, palpitations Respiratory:  No shortness of breath at rest or with exertion, wheezes GastrointestinaI: No nausea, vomiting, diarrhea, abdominal pain, fecal incontinence Genitourinary:  No dysuria, urinary retention or frequency Musculoskeletal:  No neck pain, +back pain Integumentary: No rash, pruritus, skin lesions Neurological: as above Psychiatric: No depression,+ insomnia, anxiety Endocrine: No palpitations, fatigue, diaphoresis, mood swings, change in appetite, change in weight, increased thirst Hematologic/Lymphatic:  No anemia, purpura, petechiae. Allergic/Immunologic: no itchy/runny eyes, nasal congestion, recent allergic reactions, rashes  PHYSICAL EXAM: Filed Vitals:   04/13/14 0957  BP: 112/78  Pulse: 115  Resp: 16   General: No acute distress Head:  Normocephalic/atraumatic Eyes: Fundoscopic exam shows bilateral sharp discs, no vessel changes, exudates, or hemorrhages Neck: supple, no paraspinal tenderness, full range of motion Back: No paraspinal tenderness Heart: regular rate and rhythm Lungs: Clear to auscultation bilaterally. Vascular: No carotid bruits. Skin/Extremities: No rash, no edema Neurological Exam: Mental status: alert and oriented to person, place, and time, no dysarthria or aphasia, Fund of knowledge is appropriate.  Recent and remote memory are intact.  Attention and concentration are normal.    Able to name objects and repeat phrases. Cranial nerves: CN I: not tested CN II: pupils equal, round and reactive to light, visual fields intact, fundi unremarkable. CN III, IV, VI:  full range of motion, no nystagmus, no ptosis CN V: facial sensation intact CN VII: upper and lower face symmetric CN VIII: hearing intact to finger rub CN IX, X: gag intact, uvula midline CN XI: sternocleidomastoid and trapezius muscles intact CN XII: tongue midline Bulk & Tone: normal, no fasciculations. Motor: 5/5  throughout with no pronator drift. Sensation: decreased pin on left forearm, otherwise intact to light touch, cold, pin, vibration and joint position sense.  No extinction to double simultaneous stimulation.  Romberg test negative Deep Tendon Reflexes: +2 throughout, no ankle clonus Plantar responses: downgoing bilaterally Cerebellar: no incoordination on finger to nose, heel to shin. No dysdiadochokinesia Gait: narrow-based and steady, able to tandem walk adequately. Tremor: none  IMPRESSION: This  is a 43 year old right-handed woman with a history of migraines, tachycardia, with extensive neurological workup several years ago for episodes of leg weakness and pain, who had worsening headaches last 03/01/14, found to have viral meningitis. Since hospital discharge, she continues to have frequent headaches. Her MRI brain and neurological exam today are unremarkable. We discussed symptomatic treatment of headaches, she endorses poor sleep as well, and may benefit from starting nortriptyline low dose with uptitration as tolerated. Side effects were discussed. She had been prescribed Propranolol for tachycardia, however she has been hesitant to take this. We discussed that this can help for headache prophylaxis and tremors (none noted today). She will speak with Cardiology first and if tricyclic is ineffective, she will consider beta-blocker. She knows to minimize rescue medication intake to 2-3 a week to avoid rebound headaches. At the end of the visit, she reported prior extensive workup at [facility], records will be requested for review. She will follow-up in 6 weeks.  Thank you for allowing me to participate in the care of this patient. Please do not hesitate to call for any questions or concerns.   Ellouise Newer, M.D.  CC: Dr. Birdie Riddle   Addendum 04/25/14: Records from [facility] reviewed. Patient seen in [facility] from 08/23/2008 to 03/30/2009. She has had an  extensive workup: MRI brain, cervical, lumbar spine within normal limits. She had a battery of evoked responses which were within normal litims. Bloodwork: B12, ANA, copper, ESR, CRP, thyroid function tests normal. Negative Lyme titer. She continued to report intermittent neurologic symptoms lasting several hours that resolve, most of the time takes the form of weakness in the lower extremities described as leg heaviness. No headache. Assessment: 43 year old female with intermittent episodes of leg weakness and other neurological symptoms without confirmed diagnosis as all testing to date has been normal. On one note, Dr. Jacqulynn Cadet wrote: At this point, I doubt that she has MS, but I will continue to follow her every 6 months." Plan was to continue to follow over time.

## 2014-04-13 NOTE — Patient Instructions (Addendum)
1. Start nortriptyline 10mg : Take 1 capsule at bedtime for 2 weeks, then increase to 2 capsules at bedtime 2. Minimize intake of Tylenol or any over the counter medication to 2-3 a week to avoid rebound headaches 3. We will obtain records from [facility] specialist 4. Follow-up in 6 weeks

## 2014-04-15 ENCOUNTER — Encounter: Payer: Self-pay | Admitting: Neurology

## 2014-04-19 ENCOUNTER — Ambulatory Visit: Payer: BC Managed Care – PPO | Admitting: Medical

## 2014-04-28 ENCOUNTER — Encounter: Payer: Self-pay | Admitting: Cardiology

## 2014-04-28 ENCOUNTER — Ambulatory Visit (INDEPENDENT_AMBULATORY_CARE_PROVIDER_SITE_OTHER): Payer: BC Managed Care – PPO | Admitting: Cardiology

## 2014-04-28 VITALS — BP 136/84 | HR 93 | Ht 65.25 in | Wt 214.0 lb

## 2014-04-28 DIAGNOSIS — R002 Palpitations: Secondary | ICD-10-CM

## 2014-04-28 DIAGNOSIS — R079 Chest pain, unspecified: Secondary | ICD-10-CM

## 2014-04-28 DIAGNOSIS — K219 Gastro-esophageal reflux disease without esophagitis: Secondary | ICD-10-CM

## 2014-04-28 DIAGNOSIS — R000 Tachycardia, unspecified: Secondary | ICD-10-CM

## 2014-04-28 NOTE — Progress Notes (Signed)
[place], [place], [place]  [address] Phone: [phone] Fax:  [phone]  Date:  04/28/2014   ID:  Lorraine Conrad, DOB 05/07/1971, MRN [id number]  PCP:  Annye Asa, MD  Cardiologist:  Fransico Him, MD    History of Present Illness: Lorraine Conrad is a 43 y.o. female with a history of GERD, OSA on CPAP and migraine HA's who was hospitalized in September with viral meningitis.  Since then she has had an episode of chest pain that she describes as a band around her chest that was a squeezing and pressure with pain down her left arm.  This occurred while she was in bed and lasted off and on all day.  She felt nauseated but no SOB or diaphoresis with it.  Since then she has not had any further episodes like that but has continued to have episodic pain down her left arm and pain under her left breast that comes and goes and is described as a sharp pain.  She has a very hard time describing it.  This occurs on a daily basis at some point.  She occasionally has some mild DOE.  She has noticed some LE edema for the last week.  She says that she can feel her heart racing when she lays flat in the bed.  She was referred her for further evaluation.   Wt Readings from Last 3 Encounters:  04/28/14 214 lb (97.07 kg)  04/13/14 210 lb (95.255 kg)  03/29/14 213 lb 6.4 oz (96.798 kg)     Past Medical History  Diagnosis Date  . GERD (gastroesophageal reflux disease)   . Endometriosis   . Ovarian cyst, left   . External hemorrhoids   . Erosive gastritis   . Proctalgia fugax   . Allergy     SEASONAL  . Anemia   . OSA on CPAP   . Migraine 1998; 02/2014    "this one's lasted 10 days straight" (03/01/2014)  . Headache     "probably monthly" (03/01/2014)  . Arthritis     "spine" (03/01/2014)  . Chronic lower back pain     Current Outpatient Prescriptions  Medication Sig Dispense Refill  . acetaminophen (TYLENOL) 500 MG tablet Take 1 tablet (500 mg total) by mouth every 6 (six)  hours as needed for mild pain or headache. For pain 30 tablet 0  . cyclobenzaprine (FLEXERIL) 10 MG tablet Take 1 tablet (10 mg total) by mouth 3 (three) times daily as needed for muscle spasms. 30 tablet 0  . cyclobenzaprine (FLEXERIL) 5 MG tablet Take 1 tablet (5 mg total) by mouth 3 (three) times daily as needed for muscle spasms. 30 tablet 1  . diclofenac (VOLTAREN) 75 MG EC tablet Take 1 tablet (75 mg total) by mouth 2 (two) times daily. (Patient taking differently: Take 75 mg by mouth 2 (two) times daily. Takes it every now and then due to the fact of  Thinking it may contribute to  tachycardia and weight gain) 30 tablet 0  . ibuprofen (ADVIL,MOTRIN) 200 MG tablet Take 400 mg by mouth every 6 (six) hours as needed for pain. For pain    . lansoprazole (PREVACID) 30 MG capsule Take 30 mg by mouth daily at 12 noon.    . ondansetron (ZOFRAN-ODT) 4 MG disintegrating tablet Take 4 mg by mouth every 8 (eight) hours as needed for nausea or vomiting.     No current facility-administered medications for this visit.    Allergies:  Allergies  Allergen Reactions  . Promethazine Hcl Other (See Comments)    Violent tremors   . Codeine Other (See Comments)    Was a child when she took this.  . Dilaudid [Hydromorphone] Nausea And Vomiting  . Azithromycin Rash    Head to toe rash    Social History:  The patient  reports that she has never smoked. She has never used smokeless tobacco. She reports that she drinks alcohol. She reports that she does not use illicit drugs.   Family History:  The patient's family history includes Breast cancer in her maternal grandmother; Colon polyps in her father; Heart disease in her father; Prostate cancer in her paternal grandfather. There is no history of Colon cancer.   ROS:  Please see the history of present illness.      All other systems reviewed and negative.   PHYSICAL EXAM: VS:  BP 136/84 mmHg  Pulse 93  Ht 5' 5.25" (1.657 m)  Wt 214 lb (97.07 kg)   BMI 35.35 kg/m2  LMP 02/08/2013 Well nourished, well developed, in no acute distress HEENT: normal Neck: no JVD Cardiac:  normal S1, S2; RRR; no murmur Lungs:  clear to auscultation bilaterally, no wheezing, rhonchi or rales Abd: soft, nontender, no hepatomegaly Ext: no edema Skin: warm and dry Neuro:  CNs 2-12 intact, no focal abnormalities noted  EKG:  NSR at 93bpm with no ST changes     ASSESSMENT AND PLAN:  1. Atypical chest pain that sounds more musculoskeletal but is associated with nausea and SOB.  It can be exertional or nonexetional. Her EKG is nonischemic.  I will get an ETT to rule out ischemia and 2D echo to assess LVF and pericardial effusion. 2. Palpitations with sinus tachycardia in the past.  Today's EKG showed NSR but her HR has gone up in the 120's before.  I will get a 24 hour Holter to assess her average HR 3. GERD  Followup with me PRN  Signed, Fransico Him, MD [facility] 04/28/2014 2:23 PM

## 2014-04-28 NOTE — Patient Instructions (Signed)
Your physician has requested that you have an exercise tolerance test. For further information please visit [email or web address]. Please also follow instruction sheet, as given.  Your physician has requested that you have an echocardiogram. Echocardiography is a painless test that uses sound waves to create images of your heart. It provides your doctor with information about the size and shape of your heart and how well your heart's chambers and valves are working. This procedure takes approximately one hour. There are no restrictions for this procedure.  Your physician has recommended that you wear a holter monitor. Holter monitors are medical devices that record the heart's electrical activity. Doctors most often use these monitors to diagnose arrhythmias. Arrhythmias are problems with the speed or rhythm of the heartbeat. The monitor is a small, portable device. You can wear one while you do your normal daily activities. This is usually used to diagnose what is causing palpitations/syncope (passing out).  Your physician recommends that you schedule a follow-up appointment AS NEEDED with Dr. Radford Pax.

## 2014-05-01 ENCOUNTER — Ambulatory Visit: Payer: BC Managed Care – PPO | Admitting: Neurology

## 2014-05-03 ENCOUNTER — Ambulatory Visit: Payer: BC Managed Care – PPO | Admitting: Medical

## 2014-05-08 ENCOUNTER — Ambulatory Visit (INDEPENDENT_AMBULATORY_CARE_PROVIDER_SITE_OTHER): Payer: BC Managed Care – PPO | Admitting: Medical

## 2014-05-08 ENCOUNTER — Encounter: Payer: Self-pay | Admitting: Medical

## 2014-05-08 ENCOUNTER — Telehealth: Payer: Self-pay | Admitting: Medical

## 2014-05-08 VITALS — BP 132/86 | HR 103 | Temp 98.5°F | Ht 65.0 in | Wt 214.8 lb

## 2014-05-08 DIAGNOSIS — G43009 Migraine without aura, not intractable, without status migrainosus: Secondary | ICD-10-CM

## 2014-05-08 DIAGNOSIS — R000 Tachycardia, unspecified: Secondary | ICD-10-CM

## 2014-05-08 DIAGNOSIS — M2550 Pain in unspecified joint: Secondary | ICD-10-CM

## 2014-05-08 LAB — SEDIMENTATION RATE: Sed Rate: 23 mm/hr — ABNORMAL HIGH (ref 0–22)

## 2014-05-08 LAB — C-REACTIVE PROTEIN: CRP: <0.5 mg/dL (ref 0.5–20.0)

## 2014-05-08 LAB — RHEUMATOID FACTOR

## 2014-05-08 MED ORDER — DICLOFENAC SODIUM 75 MG PO TBEC: 75.0000 mg | DELAYED_RELEASE_TABLET | Freq: Two times a day (BID) | ORAL | Status: DC

## 2014-05-08 NOTE — Assessment & Plan Note (Addendum)
Pt is being followed by neurologist. Pt will continue management by neurologist. Neuro advised nortriptyline and preventative type med if this does not help.

## 2014-05-08 NOTE — Assessment & Plan Note (Signed)
Upcoming appointment this week.  Per pt she will get a holter, echo and a stress test.

## 2014-05-08 NOTE — Progress Notes (Signed)
Subjective:    Patient ID: Lorraine Conrad, female    DOB: 1970-06-30, 43 y.o.   MRN: [id number]  HPI   Pt in states that she is doing better overall.   Pt states she has seen the neurologist and she did not start medications  that neurologist recommended.  I reviewed note last night in computer which I believe neurologist recommended nortriptylene for now and if HA perists then preventative type meds.  Pt is getting some daily milder side ha recently. She will take tylenol and within a hour pain subsides.   Pt does report some occasional joint pain. Reports some low back pain, lt hip pain, bilateral knee pain and hand pain. This has been going on for months. Mom and grand mom both have arthritis  Pt did see cardiologist. She ws having some episodes of tachycardia. Pt will see cardio tomorrow. She will get a holter, echo and a stress test. Pt never took the low dose beta blocker that I called her in.  Past Medical History  Diagnosis Date  . GERD (gastroesophageal reflux disease)   . Endometriosis   . Ovarian cyst, left   . External hemorrhoids   . Erosive gastritis   . Proctalgia fugax   . Allergy     SEASONAL  . Anemia   . OSA on CPAP   . Migraine 1998; 02/2014    "this one's lasted 10 days straight" (03/01/2014)  . Headache     "probably monthly" (03/01/2014)  . Arthritis     "spine" (03/01/2014)  . Chronic lower back pain     History   Social History  . Marital Status: Married    Spouse Name: N/A    Number of Children: N/A  . Years of Education: N/A   Occupational History  . Not on file.   Social History Main Topics  . Smoking status: Never Smoker   . Smokeless tobacco: Never Used  . Alcohol Use: 0.0 oz/week    0 Not specified per week     Comment: 03/01/2014 "I'll have 1-2 drinks maybe 3-4 times/yr"  . Drug Use: No  . Sexual Activity: Yes   Other Topics Concern  . Not on file   Social History Narrative   Daily caffiene use: 6 cups per day   Patient does  not get regular excercise    Past Surgical History  Procedure Laterality Date  . Cesarean section  2002; 2006  . Temporomandibular joint surgery  ~ 1992  . Nasal septum surgery  ~ 1991  . Laparoscopic ovarian cystectomy  ~ 1999  . Upper gastrointestinal endoscopy    . Colonoscopy    . Abdominal hysterectomy  04/2013  . Breast biopsy Bilateral   . Breast lumpectomy Left ~ 1993    Family History  Problem Relation Age of Onset  . Breast cancer Maternal Grandmother   . Colon cancer Neg Hx   . Prostate cancer Paternal Grandfather   . Colon polyps Father   . Heart disease Father     Allergies  Allergen Reactions  . Promethazine Hcl Other (See Comments)    Violent tremors   . Codeine Other (See Comments)    Was a child when she took this.  . Dilaudid [Hydromorphone] Nausea And Vomiting  . Azithromycin Rash    Head to toe rash    Current Outpatient Prescriptions on File Prior to Visit  Medication Sig Dispense Refill  . acetaminophen (TYLENOL) 500 MG tablet Take 1 tablet (500 mg  total) by mouth every 6 (six) hours as needed for mild pain or headache. For pain 30 tablet 0  . cyclobenzaprine (FLEXERIL) 10 MG tablet Take 1 tablet (10 mg total) by mouth 3 (three) times daily as needed for muscle spasms. 30 tablet 0  . cyclobenzaprine (FLEXERIL) 5 MG tablet Take 1 tablet (5 mg total) by mouth 3 (three) times daily as needed for muscle spasms. 30 tablet 1  . lansoprazole (PREVACID) 30 MG capsule Take 30 mg by mouth daily at 12 noon.    . ondansetron (ZOFRAN-ODT) 4 MG disintegrating tablet Take 4 mg by mouth every 8 (eight) hours as needed for nausea or vomiting.     No current facility-administered medications on file prior to visit.    BP 132/86 mmHg  Pulse 103  Temp(Src) 98.5 F (36.9 C) (Oral)  Ht 5\' 5"  (1.651 m)  Wt 214 lb 12.8 oz (97.433 kg)  BMI 35.74 kg/m2  SpO2 97%  LMP 02/08/2013       Review of Systems  Constitutional: Negative for fever, chills and fatigue.   Respiratory: Negative for cough, chest tightness, shortness of breath and wheezing.   Cardiovascular: Negative for chest pain and palpitations.       History of tachycardia.  Gastrointestinal: Negative for nausea, vomiting, abdominal pain and constipation.  Genitourinary: Negative for frequency, flank pain and dyspareunia.  Musculoskeletal: Positive for arthralgias.  Neurological: Negative for dizziness, seizures, facial asymmetry, weakness, light-headedness, numbness and headaches.       No has presently.  But see hpi  Hematological: Negative for adenopathy.  Psychiatric/Behavioral: Negative for suicidal ideas and dysphoric mood. The patient is not nervous/anxious.        Objective:   Physical Exam   General Mental Status- Alert. General Appearance- Not in acute distress.   Skin General: Color- Normal Color. Moisture- Normal Moisture.  Neck Carotid Arteries- Normal color. Moisture- Normal Moisture. No carotid bruits. No JVD.  Chest and Lung Exam Auscultation: Breath Sounds:-Normal. CTA  Cardiovascular Auscultation:Rythm- Regular, Rate and rhythm. Murmurs & Other Heart Sounds:Auscultation of the heart reveals- No Murmurs.  Abdomen Inspection:-Inspeection Normal. Palpation/Percussion:Note:No mass. Palpation and Percussion of the abdomen reveal- Non Tender, Non Distended + BS, no rebound or guarding.    Neurologic Cranial Nerve exam:- CN III-XII intact(No nystagmus), symmetric smile. Drift Test:- No drift. Romberg Exam:- Negative.  Heal to Toe Gait exam:-Normal. Finger to Nose:- Normal/Intact Strength:- 5/5 equal and symmetric strength both upper and lower extremities.  Musculoskeletal- today on range of motion hips and knees no obvious pain on rom. Hands- no obvious arthritic changes.        Assessment & Plan:

## 2014-05-08 NOTE — Patient Instructions (Addendum)
For your headaches which sometimes are migraine like, please follow neurologist advisement. Any severe HA with neurologic signs or symptoms then ED evalluation.  Please attend cardiologist appointment for work up of mild tachycardia.  For you arthralgias, I am ordering labs today and refilling your diclofenac.  Follow up in 2 months or as needed

## 2014-05-08 NOTE — Assessment & Plan Note (Signed)
  Pt does report some occasional joint pain. Reports some low back pain, lt hip pain, bilateral knee pain and hand pain. This has been going on for months. Mom and grandmom both have arthritis  Arthritis panel today and refilling her diclofenac.

## 2014-05-08 NOTE — Progress Notes (Signed)
Pre visit review using our clinic review tool, if applicable. No additional management support is needed unless otherwise documented below in the visit note. 

## 2014-05-08 NOTE — Telephone Encounter (Signed)
Sed rate elevated with some clinical feature of hand, knee and hip pain. Will refer to rheumatologist.

## 2014-05-09 ENCOUNTER — Ambulatory Visit (HOSPITAL_COMMUNITY): Payer: BC Managed Care – PPO | Attending: Cardiology | Admitting: Radiology

## 2014-05-09 ENCOUNTER — Encounter: Payer: Self-pay | Admitting: Radiology

## 2014-05-09 ENCOUNTER — Telehealth: Payer: Self-pay | Admitting: Cardiology

## 2014-05-09 ENCOUNTER — Encounter (INDEPENDENT_AMBULATORY_CARE_PROVIDER_SITE_OTHER): Payer: BC Managed Care – PPO

## 2014-05-09 DIAGNOSIS — R000 Tachycardia, unspecified: Secondary | ICD-10-CM

## 2014-05-09 DIAGNOSIS — R002 Palpitations: Secondary | ICD-10-CM

## 2014-05-09 DIAGNOSIS — R079 Chest pain, unspecified: Secondary | ICD-10-CM | POA: Insufficient documentation

## 2014-05-09 LAB — ANA: Anti Nuclear Antibody(ANA): NEGATIVE

## 2014-05-09 NOTE — Progress Notes (Signed)
E Cardo 24hr holter applied

## 2014-05-09 NOTE — Progress Notes (Signed)
Echocardiogram performed.  

## 2014-05-09 NOTE — Telephone Encounter (Signed)
New Msg  Patient calling with questions on how to read the time on her Holter monitor which she received today. Please contact patient at [phone].

## 2014-05-10 ENCOUNTER — Telehealth: Payer: Self-pay | Admitting: Family Medicine

## 2014-05-10 NOTE — Telephone Encounter (Signed)
Caller name: Reynalda Relation to pt: self Call back number: [phone] Pharmacy:  Reason for call:   Patient states that the medication that her neurologist put her on is nortriptylyn.

## 2014-05-10 NOTE — Telephone Encounter (Signed)
-----   Message from Sueanne Margarita, MD sent at 05/09/2014 11:35 PM EST ----- Please let patient know that echo was normal

## 2014-05-10 NOTE — Telephone Encounter (Signed)
Informed patient of normal ECHO results and verbal understanding expressed.  Patient concerned because she knows she did not have an episode while she had the 24 hour holter monitor on. She is inquiring about getting one for a longer period of time. To Dr. Radford Pax for review and recommendations.

## 2014-05-11 ENCOUNTER — Telehealth: Payer: Self-pay | Admitting: Cardiology

## 2014-05-11 NOTE — Telephone Encounter (Signed)
Walk in pt Form " Pt has concerns Of Monitor Time" Please call gave to Katy/KM

## 2014-05-11 NOTE — Telephone Encounter (Signed)
Informed patient 30 day monitor is being ordered and she will receive a call from [facility] for scheduling.  Patient grateful to Dr. Radford Pax.   Event monitor ordered.

## 2014-05-11 NOTE — Telephone Encounter (Signed)
OK to order 30 day event monitor

## 2014-05-12 ENCOUNTER — Telehealth: Payer: Self-pay

## 2014-05-12 NOTE — Telephone Encounter (Signed)
Patient calling back regarding this.  °

## 2014-05-12 NOTE — Telephone Encounter (Signed)
Lorraine Conrad [phone] [facility]  Wallace has got another yeast infection and she is wondering if you can call something in or does she need to come in.

## 2014-05-15 ENCOUNTER — Other Ambulatory Visit: Payer: Self-pay

## 2014-05-15 NOTE — Telephone Encounter (Signed)
Patient calling back regarding this. Best # [phone]

## 2014-05-15 NOTE — Telephone Encounter (Signed)
Patient called regarding medication be called in for yeast infection. Advised she would need to be seen by provider.  Patient states she will go to her GYN for exam. wanted to know what the name of medication was that she was given in the past. Advised her Fluconizole was the name.

## 2014-05-16 ENCOUNTER — Telehealth: Payer: Self-pay | Admitting: Cardiology

## 2014-05-16 ENCOUNTER — Encounter: Payer: Self-pay | Admitting: *Deleted

## 2014-05-16 ENCOUNTER — Encounter (INDEPENDENT_AMBULATORY_CARE_PROVIDER_SITE_OTHER): Payer: BC Managed Care – PPO

## 2014-05-16 DIAGNOSIS — R000 Tachycardia, unspecified: Secondary | ICD-10-CM

## 2014-05-16 NOTE — Telephone Encounter (Signed)
Please let patient know that holter monitor showed NSR wit average HR 98bpm with sinus tachycardia up to 134 bpm with occasional PAC's which are benign

## 2014-05-16 NOTE — Progress Notes (Signed)
Patient ID: Lorraine Conrad, female   DOB: May 07, 1971, 43 y.o.   MRN: [id number] [employer] 30 day cardiac event monitor applied to patient.

## 2014-05-17 NOTE — Telephone Encounter (Signed)
Patient informed of monitor results and verbal understanding expressed.   

## 2014-05-24 ENCOUNTER — Telehealth (HOSPITAL_COMMUNITY): Payer: Self-pay

## 2014-05-24 ENCOUNTER — Telehealth: Payer: Self-pay | Admitting: Neurology

## 2014-05-24 NOTE — Telephone Encounter (Signed)
Encounter complete. 

## 2014-05-24 NOTE — Telephone Encounter (Signed)
Pt canceled her f/u appt for 05/26/14. She did not give a reason. Pt will call the first of the year to r/s.

## 2014-05-25 ENCOUNTER — Telehealth (HOSPITAL_COMMUNITY): Payer: Self-pay

## 2014-05-25 NOTE — Telephone Encounter (Signed)
Encounter complete. 

## 2014-05-26 ENCOUNTER — Ambulatory Visit (HOSPITAL_COMMUNITY)
Admission: RE | Admit: 2014-05-26 | Discharge: 2014-05-26 | Disposition: A | Discharge status: Home / Self Care | Payer: BC Managed Care – PPO | Source: Ambulatory Visit | Attending: Cardiology | Admitting: Cardiology

## 2014-05-26 ENCOUNTER — Ambulatory Visit: Payer: BC Managed Care – PPO | Admitting: Neurology

## 2014-05-26 DIAGNOSIS — R079 Chest pain, unspecified: Secondary | ICD-10-CM | POA: Diagnosis present

## 2014-05-26 NOTE — Procedures (Signed)
Exercise Treadmill Test   Test  Exercise Tolerance Test Ordering MD: Fransico Him, MD  Interpreting MD:   Unique Test No: 1 Treadmill:  1  Indication for ETT: chest pain - rule out ischemia  Contraindication to ETT: No   Stress Modality: exercise - treadmill  Cardiac Imaging Performed: non   Protocol: standard Bruce - maximal  Max BP:  158/71  Max MPHR (bpm):  179 85% MPR (bpm): 152  MPHR obtained (bpm):  166 % MPHR obtained: 92  Reached 85% MPHR (min:sec):  5:55 Total Exercise Time (min-sec):  8:22  Workload in METS:  10.10 Borg Scale:   Reason ETT Terminated:  fatigue    ST Segment Analysis At Rest: normal ST segments - no evidence of significant ST depression With Exercise: no evidence of significant ST depression  Other Information Arrhythmia:  No Angina during ETT:  absent (0) Quality of ETT:  diagnostic  ETT Interpretation:  normal - no evidence of ischemia by ST analysis  Comments: Occasional PVC's Good exercise tolerance Normal BP response No chest pain  Pixie Casino, MD, [facility] Attending Cardiologist [place]

## 2014-06-08 ENCOUNTER — Telehealth: Payer: Self-pay

## 2014-06-08 MED ORDER — METOPROLOL SUCCINATE ER 25 MG PO TB24: 25.0000 mg | ORAL_TABLET | Freq: Every day | ORAL | Status: DC

## 2014-06-08 NOTE — Telephone Encounter (Signed)
Instructed patient to START Toprol XL 25 mg daily. Appt scheduled with Truitt Merle on 1/26 (soonest available). Instructed patient to call the office if symptoms worsen.

## 2014-06-08 NOTE — Telephone Encounter (Signed)
-----   Message from Sueanne Margarita, MD sent at 06/08/2014  1:21 PM EST ----- Start Toprol XL 25mg  daily and have her see PA in 2 weeks

## 2014-06-21 ENCOUNTER — Telehealth: Payer: Self-pay | Admitting: Cardiology

## 2014-06-21 NOTE — Telephone Encounter (Signed)
Mailbox is full. Will try to contact again later.

## 2014-06-21 NOTE — Telephone Encounter (Signed)
Please let patient know that heart monitor showed NSR with HR 94-119bpm and isolated PAC

## 2014-06-27 NOTE — Telephone Encounter (Signed)
Patient informed of monitor results and verbal understanding expressed.   

## 2014-07-04 ENCOUNTER — Ambulatory Visit: Payer: BC Managed Care – PPO | Admitting: Nurse Practitioner

## 2014-07-05 DIAGNOSIS — Z90710 Acquired absence of both cervix and uterus: Secondary | ICD-10-CM | POA: Insufficient documentation

## 2014-07-10 ENCOUNTER — Encounter: Payer: Self-pay | Admitting: Medical

## 2014-07-10 ENCOUNTER — Telehealth: Payer: Self-pay | Admitting: Gastroenterology

## 2014-07-10 ENCOUNTER — Ambulatory Visit (INDEPENDENT_AMBULATORY_CARE_PROVIDER_SITE_OTHER): Payer: BLUE CROSS/BLUE SHIELD | Admitting: Medical

## 2014-07-10 ENCOUNTER — Telehealth: Payer: Self-pay | Admitting: Medical

## 2014-07-10 VITALS — BP 123/84 | HR 93 | Temp 98.5°F | Ht 65.0 in | Wt 216.8 lb

## 2014-07-10 DIAGNOSIS — M2550 Pain in unspecified joint: Secondary | ICD-10-CM

## 2014-07-10 DIAGNOSIS — K648 Other hemorrhoids: Secondary | ICD-10-CM

## 2014-07-10 DIAGNOSIS — K649 Unspecified hemorrhoids: Secondary | ICD-10-CM

## 2014-07-10 DIAGNOSIS — G43909 Migraine, unspecified, not intractable, without status migrainosus: Secondary | ICD-10-CM | POA: Insufficient documentation

## 2014-07-10 DIAGNOSIS — G43009 Migraine without aura, not intractable, without status migrainosus: Secondary | ICD-10-CM

## 2014-07-10 DIAGNOSIS — R000 Tachycardia, unspecified: Secondary | ICD-10-CM

## 2014-07-10 NOTE — Progress Notes (Signed)
Pre visit review using our clinic review tool, if applicable. No additional management support is needed unless otherwise documented below in the visit note. 

## 2014-07-10 NOTE — Telephone Encounter (Signed)
Left message for patient to call back  

## 2014-07-10 NOTE — Assessment & Plan Note (Signed)
Refer to GI at pt request.

## 2014-07-10 NOTE — Telephone Encounter (Signed)
Referal for hemorrhoid.

## 2014-07-10 NOTE — Assessment & Plan Note (Signed)
Pt saw cardiologist for her tachycardia. They offered her toprol xl. She decided not to use that. She has appointment with them on 17 th of February. Pt admits sometimes feeling heart beating fast at times.

## 2014-07-10 NOTE — Telephone Encounter (Signed)
Patient wants her external hemorrhoids removed.  I explained that we can treat her for internal hemorrhoids with a banding procedure, but not "remove external".  She is advised to have her primary refer her to [facility], since she has had a recent eval with them.  She is not wanting treatment, but removal.  She will call back for any additional questions or concerns.

## 2014-07-10 NOTE — Progress Notes (Signed)
Subjective:    Patient ID: Lorraine Conrad, female    DOB: 27-Feb-1971, 44 y.o.   MRN: [id number]  HPI   Pt did see then neurologist. Pt has hx of migraines. They advised medication slow release med. But pt decided against. When she get ha she only takes tylenol.  Pt saw cardiologist for her tachycardia. They offered her toprol xl. She decided not to use that. She has appointment with them on 17 th of February.  Pt admits sometimes feeling heart beating fast at times.  Pt still has arthralgia.Still has hands and  elbow pain. Pt saw the rheumatologist for mild sed rate elevation. He thought pt may have had fibromyalgia. Pt doe not want to consider this as a diagnosis."Does not want this label".  Pt states does well with anti-inflammatory tabs on some days.    Pt has history of hemorrhoids. On and off for years. Pt feels these areas. Itch and bleed occasionally. Pt declines.     Review of Systems  Constitutional: Negative for fever, chills and fatigue.  Respiratory: Negative for cough, choking and shortness of breath.   Cardiovascular: Negative for chest pain and palpitations.       No tachycardia presently.  Gastrointestinal: Negative for abdominal pain.  Musculoskeletal: Positive for arthralgias. Negative for back pain.  Neurological: Negative for dizziness, tremors, seizures, facial asymmetry, speech difficulty, weakness, light-headedness, numbness and headaches.  Hematological: Negative for adenopathy. Does not bruise/bleed easily.  Psychiatric/Behavioral: Negative for hallucinations, behavioral problems, confusion, sleep disturbance, dysphoric mood and decreased concentration. The patient is not nervous/anxious and is not hyperactive.     Past Medical History  Diagnosis Date  . GERD (gastroesophageal reflux disease)   . Endometriosis   . Ovarian cyst, left   . External hemorrhoids   . Erosive gastritis   . Proctalgia fugax   . Allergy     SEASONAL  . Anemia   . OSA on CPAP    . Migraine 1998; 02/2014    "this one's lasted 10 days straight" (03/01/2014)  . Headache     "probably monthly" (03/01/2014)  . Arthritis     "spine" (03/01/2014)  . Chronic lower back pain     History   Social History  . Marital Status: Married    Spouse Name: N/A    Number of Children: N/A  . Years of Education: N/A   Occupational History  . Not on file.   Social History Main Topics  . Smoking status: Never Smoker   . Smokeless tobacco: Never Used  . Alcohol Use: 0.0 oz/week    0 Not specified per week     Comment: 03/01/2014 "I'll have 1-2 drinks maybe 3-4 times/yr"  . Drug Use: No  . Sexual Activity: Yes   Other Topics Concern  . Not on file   Social History Narrative   Daily caffiene use: 6 cups per day   Patient does not get regular excercise    Past Surgical History  Procedure Laterality Date  . Cesarean section  2002; 2006  . Temporomandibular joint surgery  ~ 1992  . Nasal septum surgery  ~ 1991  . Laparoscopic ovarian cystectomy  ~ 1999  . Upper gastrointestinal endoscopy    . Colonoscopy    . Abdominal hysterectomy  04/2013  . Breast biopsy Bilateral   . Breast lumpectomy Left ~ 1993    Family History  Problem Relation Age of Onset  . Breast cancer Maternal Grandmother   . Colon cancer Neg Hx   .  Prostate cancer Paternal Grandfather   . Colon polyps Father   . Heart disease Father     Allergies  Allergen Reactions  . Promethazine Hcl Other (See Comments)    Violent tremors   . Codeine Other (See Comments)    Was a child when she took this.  . Dilaudid [Hydromorphone] Nausea And Vomiting  . Azithromycin Rash    Head to toe rash    Current Outpatient Prescriptions on File Prior to Visit  Medication Sig Dispense Refill  . acetaminophen (TYLENOL) 500 MG tablet Take 1 tablet (500 mg total) by mouth every 6 (six) hours as needed for mild pain or headache. For pain 30 tablet 0  . cyclobenzaprine (FLEXERIL) 10 MG tablet Take 1 tablet (10 mg  total) by mouth 3 (three) times daily as needed for muscle spasms. 30 tablet 0  . diclofenac (VOLTAREN) 75 MG EC tablet Take 1 tablet (75 mg total) by mouth 2 (two) times daily. 30 tablet 0  . lansoprazole (PREVACID) 30 MG capsule Take 30 mg by mouth daily at 12 noon.    . metoprolol succinate (TOPROL XL) 25 MG 24 hr tablet Take 1 tablet (25 mg total) by mouth daily. (Patient not taking: Reported on 07/10/2014) 30 tablet 3  . ondansetron (ZOFRAN-ODT) 4 MG disintegrating tablet Take 4 mg by mouth every 8 (eight) hours as needed for nausea or vomiting.     No current facility-administered medications on file prior to visit.    BP 123/84 mmHg  Pulse 93  Temp(Src) 98.5 F (36.9 C) (Oral)  Ht 5\' 5"  (1.651 m)  Wt 216 lb 12.8 oz (98.34 kg)  BMI 36.08 kg/m2  SpO2 99%  LMP 02/08/2013       Objective:   Physical Exam  General Mental Status- Alert. General Appearance- Not in acute distress.   Skin General: Color- Normal Color. Moisture- Normal Moisture.  Neck Carotid Arteries- Normal color. Moisture- Normal Moisture. No carotid bruits. No JVD.  Chest and Lung Exam Auscultation: Breath Sounds:-Normal.  Cardiovascular Auscultation:Rythm- Regular. Murmurs & Other Heart Sounds:Auscultation of the heart reveals- No Murmurs.  Abdomen Inspection:-Inspeection Normal. Palpation/Percussion:Note:No mass. Palpation and Percussion of the abdomen reveal- Non Tender, Non Distended + BS, no rebound or guarding.    Neurologic Cranial Nerve exam:- CN III-XII intact(No nystagmus), symmetric smile. Drift Test:- No drift. Romberg Exam:- Negative.  Heal to Toe Gait exam:-Normal. Finger to Nose:- Normal/Intact Strength:- 5/5 equal and symmetric strength both upper and lower extremities.      Assessment & Plan:

## 2014-07-10 NOTE — Patient Instructions (Addendum)
You declined medication from the neurologist for migraine ha. You report satisfaction with only tylenol for occassonional. If ha get worse notify me and please reconsider the medication neurologist recommended.  For you tachycardia, see the cardiologist. Please note pamelor is the medication that neurologist considered for you ha. Please get opinion of cardioligist regarding this medication effect on your heart.  For you arthralgias, I would recommend periodic use of diclofenac. Repeat arthritis panel in a year see if markers change. Refer back to rheumatologist if clinically worsening or get second opinion.  Refer to GI for your hemorrhoids.  Follow up in 3-4 months or as needed.

## 2014-07-10 NOTE — Assessment & Plan Note (Signed)
Pt still has arthralgia.Still has hands and elbow pain. Pt saw the rheumatologist for mild sed rate elevation. He thought pt may have had fibromyalgia. Pt doe not want to consider this as a diagnosis."Does not want this label".  Pt states does well with anti-inflammatory tabs on some days

## 2014-07-10 NOTE — Assessment & Plan Note (Signed)
Pt did see then neurologist. Pt has hx of migraines. They advised medication slow release med. But pt decided against. When she get ha she only takes tylenol.

## 2014-07-11 NOTE — Telephone Encounter (Signed)
Referral faxed to [facility], awaiting appt °

## 2014-07-17 ENCOUNTER — Telehealth: Payer: Self-pay | Admitting: Family Medicine

## 2014-07-17 MED ORDER — DICLOFENAC SODIUM 75 MG PO TBEC: 75.0000 mg | DELAYED_RELEASE_TABLET | Freq: Two times a day (BID) | ORAL | Status: DC

## 2014-07-17 NOTE — Telephone Encounter (Signed)
Refill her diclofenac at her request.

## 2014-07-17 NOTE — Telephone Encounter (Signed)
Caller name: Marcell Relation to pt: self Call back number:  [phone] Pharmacy: [facility] [address]  Reason for call:   Requesting a refill of diclofenac (VOLTAREN)

## 2014-07-24 ENCOUNTER — Other Ambulatory Visit (INDEPENDENT_AMBULATORY_CARE_PROVIDER_SITE_OTHER): Payer: Self-pay | Admitting: Surgery

## 2014-07-24 ENCOUNTER — Telehealth: Payer: Self-pay | Admitting: Medical

## 2014-07-24 DIAGNOSIS — K644 Residual hemorrhoidal skin tags: Secondary | ICD-10-CM

## 2014-07-24 NOTE — Telephone Encounter (Signed)
Referral to surgeon for removal of external hemorrhoid.

## 2014-07-24 NOTE — H&P (Signed)
Lorraine Conrad 07/24/2014 2:56 PM Location: [facility] Patient #: [id number] DOB: [birth year] Married / Language: Cleophus Molt / Race: White Female History of Present Illness Adin Hector MD; 07/24/2014 3:42 PM) Patient words: hems.  The patient is a 44 year old female who presents with hemorrhoids. Patient sent for concern of hemorrhoids by primary care office, Meriam Sprague Saguier, PA-C  Pleasant active woman. Comes today with her husband. Has struggled with hemorrhoid flares since pregnancy of her daughter 13 years ago. Struggles was some occasionally loose and hard bowel movements. Usually has bowel movement twice a day. No severe problems with that. Family history of colon polyps. Had colonoscopy 2009 that was completely normal. No history of Crohn's or ulcerative colitis. No inflammatory bowel disease. Question of diagnosis of irritable bowel. She's been struggling with hemorrhoids especially last 6 months. Occasionally has sharp pain. Has history of painful razor blade like bowel movements in the distant past but not recently. Occasionally gets bleeding when she wipes. Usually hard to keep the area clean. Itching and irritation. Over-the-counter medications like Preparation H have not been very helpful. She was to have something more aggressively done. Good exercise tolerance. Has some reflux and gastritis treated with Prevacid. Allergies Elbert Ewings, [place]; 07/24/2014 2:58 PM) Codeine and Related Zithromax Z-Pak *MACROLIDES* Dilaudid *ANALGESICS - OPIOID*    Vitals Elbert Ewings CMA; 07/24/2014 2:59 PM) 07/24/2014 2:58 PM Weight: 213 lb Height: 64in Body Surface Area: 2.09 m Body Mass Index: 36.56 kg/m Temp.: 97.83F  Pulse: 60 (Regular)  Resp.: 18 (Unlabored)  BP: 112/60 (Sitting, Left Arm, Standard)     Physical Exam Adin Hector MD; 07/24/2014 3:37 PM)  General Mental Status-Alert. General Appearance-Not in acute distress, Not  Sickly. Orientation-Oriented X3. Hydration-Well hydrated. Voice-Normal.  Integumentary Global Assessment Upon inspection and palpation of skin surfaces of the - Axillae: non-tender, no inflammation or ulceration, no drainage. and Distribution of scalp and body hair is normal. General Characteristics Temperature - normal warmth is noted.  Head and Neck Head-normocephalic, atraumatic with no lesions or palpable masses. Face Global Assessment - atraumatic, no absence of expression. Neck Global Assessment - no abnormal movements, no bruit auscultated on the right, no bruit auscultated on the left, no decreased range of motion, non-tender. Trachea-midline. Thyroid Gland Characteristics - non-tender.  Eye Eyeball - Left-Extraocular movements intact, No Nystagmus. Eyeball - Right-Extraocular movements intact, No Nystagmus. Cornea - Left-No Hazy. Cornea - Right-No Hazy. Sclera/Conjunctiva - Left-No scleral icterus, No Discharge. Sclera/Conjunctiva - Right-No scleral icterus, No Discharge. Pupil - Left-Direct reaction to light normal. Pupil - Right-Direct reaction to light normal.  ENMT Ears Pinna - Left - no drainage observed, no generalized tenderness observed. Right - no drainage observed, no generalized tenderness observed. Nose and Sinuses External Inspection of the Nose - no destructive lesion observed. Inspection of the nares - Left - quiet respiration. Right - quiet respiration. Mouth and Throat Lips - Upper Lip - no fissures observed, no pallor noted. Lower Lip - no fissures observed, no pallor noted. Nasopharynx - no discharge present. Oral Cavity/Oropharynx - Tongue - no dryness observed. Oral Mucosa - no cyanosis observed. Hypopharynx - no evidence of airway distress observed.  Chest and Lung Exam Inspection Movements - Normal and Symmetrical. Accessory muscles - No use of accessory muscles in breathing. Palpation Palpation of the chest reveals  - Non-tender. Auscultation Breath sounds - Normal and Clear.  Cardiovascular Auscultation Rhythm - Regular. Murmurs & Other Heart Sounds - Auscultation of the heart reveals - No Murmurs  and No Systolic Clicks.  Abdomen Inspection Inspection of the abdomen reveals - No Visible peristalsis and No Abnormal pulsations. Umbilicus - No Bleeding, No Urine drainage. Palpation/Percussion Palpation and Percussion of the abdomen reveal - Soft, Non Tender, No Rebound tenderness, No Rigidity (guarding) and No Cutaneous hyperesthesia.  Female Genitourinary Sexual Maturity Tanner 5 - Adult hair pattern. Note: No vaginal bleeding nor discharge   Rectal Note: Perianal skin clear. No fistula. No pilonidal disease. Posterior midline and RIGHT anterior external hemorrhoids. Mild thinning and posterior anal canal suspicious of old anal fissure. No active fissure. Normal sphincter tone. Tolerated digital and anoscopic examination. Mild LEFT lateral internal hemorrhoid. Rest piles mildly enlarged. No mass or tumor.   Peripheral Vascular Upper Extremity Inspection - Left - No Cyanotic nailbeds, Not Ischemic. Right - No Cyanotic nailbeds, Not Ischemic.  Neurologic Neurologic evaluation reveals -normal attention span and ability to concentrate, able to name objects and repeat phrases. Appropriate fund of knowledge , normal sensation and normal coordination. Mental Status Affect - not angry, not paranoid. Cranial Nerves-Normal Bilaterally. Gait-Normal.  Neuropsychiatric Mental status exam performed with findings of-able to articulate well with normal speech/language, rate, volume and coherence, thought content normal with ability to perform basic computations and apply abstract reasoning and no evidence of hallucinations, delusions, obsessions or homicidal/suicidal ideation. Note: Mildly anxious but consolable.   Musculoskeletal Global Assessment Spine, Ribs and Pelvis - no instability,  subluxation or laxity. Right Upper Extremity - no instability, subluxation or laxity.  Lymphatic Head & Neck  General Head & Neck Lymphatics: Bilateral - Description - No Localized lymphadenopathy. Axillary  General Axillary Region: Bilateral - Description - No Localized lymphadenopathy. Femoral & Inguinal  Generalized Femoral & Inguinal Lymphatics: Left - Description - No Localized lymphadenopathy. Right - Description - No Localized lymphadenopathy.   Results Adin Hector MD; 07/24/2014 3:40 PM) Procedures  Name Value Date ANOSCOPY, DIAGNOSTIC [phone]) [ Hemorrhoids ] Procedure Anal exam: External Hemorrhoid Skin tag Internal exam: Internal Hemorroids ( non-bleeding) Other: Perianal skin clear. No fistula. No pilonidal disease. Posterior midline and RIGHT anterior external hemorrhoids. Mild thinning and posterior anal canal suspicious of old anal fissure. No active fissure. Normal sphincter tone. Tolerated digital and anoscopic examination. Mild LEFT lateral internal hemorrhoid. Rest piles mildly enlarged. No mass or tumor.  Performed: 07/24/2014 3:40 PM    Assessment & Plan Adin Hector MD; 07/24/2014 3:40 PM)  EXTERNAL HEMORRHOIDS WITH COMPLICATION (671.2  W58.0) Impression: Seems to be the main area of problems with itching and fair hygiene. No good answer aside from external hemorrhoidectomy. Would be outpatient surgery. Another option is try to improve bowel regimen and hygiene but she is already pretty much close to their. She's been struggling this for over a decade and persistent this past half year. She wishes to proceed with surgery. Questions answered. Her husband had many questions. I believe I answered their questions to their satisfaction.  Current Plans Schedule for Surgery Written instructions provided The anatomy & physiology of the anorectal region was discussed. The pathophysiology of hemorrhoids and differential diagnosis was  discussed. Natural history risks without surgery was discussed. I stressed the importance of a bowel regimen to have daily soft bowel movements to minimize progression of disease. Interventions such as sclerotherapy & banding were discussed.  The patient's symptoms are not adequately controlled by medicines and other non-operative treatments. I feel the risks & problems of no surgery outweigh the operative risks; therefore, I recommended surgery to treat the hemorrhoids by ligation, pexy,  and possible resection.  Risks such as bleeding, infection, urinary difficulties, need for further treatment, heart attack, death, and other risks were discussed. I noted a good likelihood this will help address the problem. Goals of post-operative recovery were discussed as well. Possibility that this will not correct all symptoms was explained. Post-operative pain, bleeding, constipation, and other problems after surgery were discussed. We will work to minimize complications. Educational handouts further explaining the pathology, treatment options, and bowel regimen were given as well. Questions were answered. The patient expresses understanding & wishes to proceed with surgery. Pt Education - [facility] Rectal Surgery HCI (Latrelle Bazar): discussed with patient and provided information. Pt Education - [facility] Pelvic Floor Exercises (Kegels) and Dysfunction HCI (Lynton Crescenzo) ANOSCOPY, DIAGNOSTIC (46600) PROLAPSED INTERNAL HEMORRHOIDS, GRADE 2 (455.2  K64.8) Impression: LEFT lateral pile mildly inflamed but not severe. Most likely would benefit from ligation at the time of external hemorrhoidectomies. Doubt formal THD hem ligation/pexy warranted.  Current Plans Pt Education - [facility] Hemorrhoids (Muhamad Serano)  Adin Hector, M.D., F.A.C.S. Gastrointestinal and Minimally Invasive Surgery [facility] [address] [place], [place] [address] [phone] Main / Paging

## 2014-07-26 ENCOUNTER — Telehealth: Payer: Self-pay | Admitting: Family Medicine

## 2014-07-26 ENCOUNTER — Ambulatory Visit: Payer: Self-pay | Admitting: Nurse Practitioner

## 2014-08-22 ENCOUNTER — Ambulatory Visit (INDEPENDENT_AMBULATORY_CARE_PROVIDER_SITE_OTHER): Payer: BLUE CROSS/BLUE SHIELD | Admitting: Nurse Practitioner

## 2014-08-22 ENCOUNTER — Encounter: Payer: Self-pay | Admitting: Nurse Practitioner

## 2014-08-22 VITALS — BP 120/90 | HR 87 | Ht 64.0 in | Wt 209.4 lb

## 2014-08-22 DIAGNOSIS — R002 Palpitations: Secondary | ICD-10-CM

## 2014-08-22 DIAGNOSIS — R079 Chest pain, unspecified: Secondary | ICD-10-CM

## 2014-08-22 DIAGNOSIS — R000 Tachycardia, unspecified: Secondary | ICD-10-CM

## 2014-08-22 MED ORDER — METOPROLOL SUCCINATE ER 25 MG PO TB24: 25.0000 mg | ORAL_TABLET | Freq: Every day | ORAL | Status: DC

## 2014-08-22 NOTE — Progress Notes (Signed)
CARDIOLOGY OFFICE NOTE  Date:  08/22/2014    Lorraine Conrad Date of Birth: 12-27-1970 Medical Record #[id number]  PCP:  Annye Asa, MD  Cardiologist:  Radford Pax    Chief Complaint  Patient presents with  . Follow up visit after starting Toprol for palpiations/CP    Seen for Dr. Radford Pax.     History of Present Illness: Lorraine Conrad is a 44 y.o. female who presents today for a follow up visit. Seen for Dr. Radford Pax. She has a history of GERD, OSA on CPAP and migraine HA's who was hospitalized in September of 2015 with viral meningitis.   Seen back in November by Dr. Radford Pax for consultation. Noted that since her admission for the meningitis that she had had chest pain and palpitations. Dr. Radford Pax started Toprol. Referred for GXT and echo as well. Holter showed PACs and tachycardia.   Comes back today. Here with her husband. She is still having some heart racing and "breathiness". She does not exercise. She uses lots of caffeine - loves Coke and Dr. Malachi Bonds. Likes chocolate. Still having some headaches as a sequale from her meningitis - neuro was wanting to try Pamelor - this would probably make her have more tachycardia.   Past Medical History  Diagnosis Date  . GERD (gastroesophageal reflux disease)   . Endometriosis   . Ovarian cyst, left   . External hemorrhoids   . Erosive gastritis   . Proctalgia fugax   . Allergy     SEASONAL  . Anemia   . OSA on CPAP   . Migraine 1998; 02/2014    "this one's lasted 10 days straight" (03/01/2014)  . Headache     "probably monthly" (03/01/2014)  . Arthritis     "spine" (03/01/2014)  . Chronic lower back pain     Past Surgical History  Procedure Laterality Date  . Cesarean section  2002; 2006  . Temporomandibular joint surgery  ~ 1992  . Nasal septum surgery  ~ 1991  . Laparoscopic ovarian cystectomy  ~ 1999  . Upper gastrointestinal endoscopy    . Colonoscopy    . Abdominal hysterectomy  04/2013  . Breast biopsy Bilateral     . Breast lumpectomy Left ~ 1993     Medications: Current Outpatient Prescriptions  Medication Sig Dispense Refill  . acetaminophen (TYLENOL) 500 MG tablet Take 1 tablet (500 mg total) by mouth every 6 (six) hours as needed for mild pain or headache. For pain 30 tablet 0  . cyclobenzaprine (FLEXERIL) 10 MG tablet Take 1 tablet (10 mg total) by mouth 3 (three) times daily as needed for muscle spasms. 30 tablet 0  . diclofenac (VOLTAREN) 75 MG EC tablet Take 1 tablet (75 mg total) by mouth 2 (two) times daily. 30 tablet 0  . lansoprazole (PREVACID) 30 MG capsule Take 30 mg by mouth daily at 12 noon.    . ondansetron (ZOFRAN-ODT) 4 MG disintegrating tablet Take 4 mg by mouth every 8 (eight) hours as needed for nausea or vomiting.    . metoprolol succinate (TOPROL XL) 25 MG 24 hr tablet Take 1 tablet (25 mg total) by mouth daily. 30 tablet 11   No current facility-administered medications for this visit.    Allergies: Allergies  Allergen Reactions  . Promethazine Hcl Other (See Comments)    Violent tremors   . Codeine Other (See Comments)    Was a child when she took this.  . Dilaudid [Hydromorphone] Nausea And Vomiting  . Azithromycin  Rash    Head to toe rash    Social History: The patient  reports that she has never smoked. She has never used smokeless tobacco. She reports that she drinks alcohol. She reports that she does not use illicit drugs.   Family History: The patient's family history includes Breast cancer in her maternal grandmother; Colon polyps in her father; Heart disease in her father; Prostate cancer in her paternal grandfather. There is no history of Colon cancer.   Review of Systems: Please see the history of present illness.   Otherwise, the review of systems is positive for chest pain. She has leg swelling. DOE, diarrhea, back pain, muscle pain, snoring, constipation and headaches.   All other systems are reviewed and negative.   Physical Exam: VS:  BP 120/90  mmHg  Pulse 87  Ht 5\' 4"  (1.626 m)  Wt 209 lb 6.4 oz (94.983 kg)  BMI 35.93 kg/m2  LMP 02/08/2013 .  BMI Body mass index is 35.93 kg/(m^2).  Wt Readings from Last 3 Encounters:  08/22/14 209 lb 6.4 oz (94.983 kg)  07/10/14 216 lb 12.8 oz (98.34 kg)  05/08/14 214 lb 12.8 oz (97.433 kg)    General: Pleasant. She is obese. Well developed, well nourished and in no acute distress.  HEENT: Normal. Neck: Supple, no JVD, carotid bruits, or masses noted.  Cardiac: Regular rate and rhythm. No murmurs, rubs, or gallops. No edema.  Respiratory:  Lungs are clear to auscultation bilaterally with normal work of breathing.  GI: Soft and nontender.  MS: No deformity or atrophy. Gait and ROM intact. Skin: Warm and dry. Color is normal.  Neuro:  Strength and sensation are intact and no gross focal deficits noted.  Psych: Alert, appropriate and with normal affect.   LABORATORY DATA:  EKG:  EKG is ordered today. This demonstrates NSR. Tracing is unchanged.   Lab Results  Component Value Date   WBC 10.9* 03/27/2014   HGB 14.1 03/27/2014   HCT 43.0 03/27/2014   PLT 502.0* 03/27/2014   GLUCOSE 94 03/27/2014   CHOL 223* 01/16/2014   TRIG 178.0* 01/16/2014   HDL 37.50* 01/16/2014   LDLCALC 150* 01/16/2014   ALT 33 03/27/2014   AST 28 03/27/2014   NA 137 03/27/2014   K 4.1 03/27/2014   CL 100 03/27/2014   CREATININE 0.8 03/27/2014   BUN 7 03/27/2014   CO2 26 03/27/2014   TSH 0.43 01/16/2014   INR 1.07 03/01/2014   HGBA1C 5.4 03/27/2014    BNP (last 3 results) No results for input(s): BNP in the last 8760 hours.  ProBNP (last 3 results) No results for input(s): PROBNP in the last 8760 hours.   Other Studies Reviewed Today:  Echo Study Conclusions from December 2015  - Left ventricle: The cavity size was normal. Systolic function was normal. The estimated ejection fraction was in the range of 60% to 65%. Wall motion was normal; there were no regional wall motion  abnormalities. - Atrial septum: No defect or patent foramen ovale was identified.    Exercise Tolerance Test Ordering MD: Fransico Him, MD  Interpreting MD:   Unique Test No: 1 Treadmill: 1  Indication for ETT: chest pain - rule out ischemia  Contraindication to ETT: No   Stress Modality: exercise - treadmill  Cardiac Imaging Performed: non   Protocol: standard Bruce - maximal  Max BP: 158/71  Max MPHR (bpm): 179 85% MPR (bpm): 152  MPHR obtained (bpm): 166 % MPHR obtained: 92  Reached  85% MPHR (min:sec): 5:55 Total Exercise Time (min-sec): 8:22  Workload in METS: 10.10 Borg Scale:   Reason ETT Terminated: fatigue    ST Segment Analysis At Rest:normal ST segments - no evidence of significant ST depression With Exercise:no evidence of significant ST depression  Other Information Arrhythmia: NoAngina during ETT: absent (0) Quality of ETT: diagnostic  ETT Interpretation: normal - no evidence of ischemia by ST analysis  Comments: Occasional PVC's Good exercise tolerance Normal BP response No chest pain  Pixie Casino, MD, [facility] Attending Cardiologist [facility]       Assessment/Plan: 1. Chest pain - negative GXT - she has a + FH for early CAD - needs to work on risk factors.   2. Palpitations - noted to have PVCs, PACs and tachycardia - she never filled this RX - she would like to have on hand and use prn.   3. Headaches - would avoid Pamelor  4. Obesity  5. Mildly elevated BP  Needs to really work on her risk factors - discussed at length today.   Current medicines are reviewed with the patient today.  The patient does not have concerns regarding medicines other than what has been noted above.  The following changes have been made:  See above.  Labs/ tests ordered today include:    Orders Placed This Encounter  Procedures  . EKG 12-Lead     Disposition:   FU with Dr. Radford Pax prn.     Patient is agreeable to this plan and will call if any problems develop in the interim.   Signed: Burtis Junes, RN, ANP-C 08/22/2014 10:19 AM  [facility] [address] [place], [place]  [address] Phone: [phone] Fax: [phone]

## 2014-08-22 NOTE — Patient Instructions (Addendum)
Stay on your current medicines  You can use the Metoprolol as needed - I have sent this to your pharmacy  Think about what we talked about today.   Restrict your caffeine  See Dr. Radford Pax back as needed  Call the [facility] office at [phone] if you have any questions, problems or concerns.

## 2014-10-05 ENCOUNTER — Other Ambulatory Visit: Payer: Self-pay | Admitting: Obstetrics and Gynecology

## 2014-10-05 DIAGNOSIS — N6001 Solitary cyst of right breast: Secondary | ICD-10-CM

## 2014-10-10 ENCOUNTER — Ambulatory Visit
Admission: RE | Admit: 2014-10-10 | Discharge: 2014-10-10 | Disposition: A | Discharge status: Home / Self Care | Payer: BLUE CROSS/BLUE SHIELD | Source: Ambulatory Visit | Attending: Obstetrics and Gynecology | Admitting: Obstetrics and Gynecology

## 2014-10-10 ENCOUNTER — Other Ambulatory Visit: Payer: Self-pay | Admitting: Obstetrics and Gynecology

## 2014-10-10 DIAGNOSIS — N6001 Solitary cyst of right breast: Secondary | ICD-10-CM

## 2014-10-30 ENCOUNTER — Ambulatory Visit (INDEPENDENT_AMBULATORY_CARE_PROVIDER_SITE_OTHER): Payer: BLUE CROSS/BLUE SHIELD | Admitting: Medical

## 2014-10-30 ENCOUNTER — Ambulatory Visit: Payer: BLUE CROSS/BLUE SHIELD | Admitting: Family Medicine

## 2014-10-30 ENCOUNTER — Ambulatory Visit (HOSPITAL_BASED_OUTPATIENT_CLINIC_OR_DEPARTMENT_OTHER)
Admission: RE | Admit: 2014-10-30 | Discharge: 2014-10-30 | Disposition: A | Discharge status: Home / Self Care | Payer: BLUE CROSS/BLUE SHIELD | Source: Ambulatory Visit | Attending: Medical | Admitting: Medical

## 2014-10-30 ENCOUNTER — Encounter: Payer: Self-pay | Admitting: Medical

## 2014-10-30 VITALS — BP 113/79 | HR 98 | Temp 98.3°F | Wt 208.6 lb

## 2014-10-30 DIAGNOSIS — D473 Essential (hemorrhagic) thrombocythemia: Secondary | ICD-10-CM | POA: Diagnosis not present

## 2014-10-30 DIAGNOSIS — M609 Myositis, unspecified: Secondary | ICD-10-CM

## 2014-10-30 DIAGNOSIS — R0602 Shortness of breath: Secondary | ICD-10-CM

## 2014-10-30 DIAGNOSIS — M791 Myalgia: Secondary | ICD-10-CM | POA: Diagnosis not present

## 2014-10-30 DIAGNOSIS — IMO0001 Reserved for inherently not codable concepts without codable children: Secondary | ICD-10-CM

## 2014-10-30 DIAGNOSIS — R000 Tachycardia, unspecified: Secondary | ICD-10-CM

## 2014-10-30 DIAGNOSIS — G43809 Other migraine, not intractable, without status migrainosus: Secondary | ICD-10-CM

## 2014-10-30 DIAGNOSIS — D75839 Thrombocytosis, unspecified: Secondary | ICD-10-CM

## 2014-10-30 LAB — CBC WITH DIFFERENTIAL/PLATELET
BASOS PCT: 0.3 % (ref 0.0–3.0)
Basophils Absolute: 0 10*3/uL (ref 0.0–0.1)
EOS ABS: 0.3 10*3/uL (ref 0.0–0.7)
Eosinophils Relative: 2.8 % (ref 0.0–5.0)
HCT: 40.7 % (ref 36.0–46.0)
Hemoglobin: 13.6 g/dL (ref 12.0–15.0)
Lymphocytes Relative: 24.7 % (ref 12.0–46.0)
Lymphs Abs: 2.3 10*3/uL (ref 0.7–4.0)
MCHC: 33.4 g/dL (ref 30.0–36.0)
MCV: 83.9 fl (ref 78.0–100.0)
MONOS PCT: 6.5 % (ref 3.0–12.0)
Monocytes Absolute: 0.6 10*3/uL (ref 0.1–1.0)
NEUTROS ABS: 6 10*3/uL (ref 1.4–7.7)
Neutrophils Relative %: 65.7 % (ref 43.0–77.0)
PLATELETS: 419 10*3/uL — AB (ref 150.0–400.0)
RBC: 4.85 Mil/uL (ref 3.87–5.11)
RDW: 14.1 % (ref 11.5–15.5)
WBC: 9.2 10*3/uL (ref 4.0–10.5)

## 2014-10-30 MED ORDER — ALBUTEROL SULFATE HFA 108 (90 BASE) MCG/ACT IN AERS: 2.0000 | INHALATION_SPRAY | Freq: Four times a day (QID) | RESPIRATORY_TRACT | Status: DC | PRN

## 2014-10-30 MED ORDER — DICLOFENAC SODIUM 75 MG PO TBEC: 75.0000 mg | DELAYED_RELEASE_TABLET | Freq: Two times a day (BID) | ORAL | Status: DC

## 2014-10-30 NOTE — Patient Instructions (Addendum)
SOB (shortness of breath) Rare and transient. None recently but she states frequently in the past. Will get cxr today. When she has next episode. I want her to use albuterol inhaler and not if resolved quickly. Give me updat. Rx advisement given.   Myalgia and myositis Continue diclofenac as needed for pain. Other medications discussed but presently pt declines.   Migraine This is currently much better. Ha less frequent and intense. She can't remember when last ha was so infrequent.   Tachycardia On monitor today pt stayed around 88. Well controlled. Did give her advisment on side effect albuterol if she has to use that for mild sob.    Pulse controlled today. Migraines recenelty controlled.   Please see dentist for likley tmj pain. During interim. Avoid chewing gum,ice or crunchy foods.  Follow up 3-4 wks or as needed

## 2014-10-30 NOTE — Assessment & Plan Note (Signed)
This is currently much better. Ha less frequent and intense. She can't remember when last ha was so infrequent.

## 2014-10-30 NOTE — Progress Notes (Signed)
Pre visit review using our clinic review tool, if applicable. No additional management support is needed unless otherwise documented below in the visit note. 

## 2014-10-30 NOTE — Assessment & Plan Note (Signed)
On monitor today pt stayed around 88. Well controlled. Did give her advisment on side effect albuterol if she has to use that for mild sob.

## 2014-10-30 NOTE — Assessment & Plan Note (Signed)
Continue diclofenac as needed for pain. Other medications discussed but presently pt declines.

## 2014-10-30 NOTE — Progress Notes (Signed)
   Subjective:    Patient ID: Lorraine Conrad, female    DOB: 06-11-1970, 44 y.o.   MRN: [id number]  HPI  Pt in for evaluation. She describes occasional feeling sob. But she can't remember when that was. Then she states took diclofenac for three days. This symptoms cleared. She does not report any wheezing with this sob sensation. No chest pain reported no tachycardia.  Going up steps for those 3 days would feel short winded. Then she states years back this would happen randomly. Pt states that one provider in past gave her inhaler and thought she had asthma.    Pt pulse today is 88-89 when I check. In past she declined use of toprol.  Pt states her joint pains are feeling better. She feels better with diclofenac.  Pt migraine ha are better. Less frequent and less intense. She can't remember her last ha.   Occasional rt tmj area pain.Pain going on for a while. Hx of tmj surgery. Pain from the joint toward rt upper and lower teeth. Pain comes and goes no rash.   Pt will see dentist next week.    Review of Systems  Constitutional: Negative for fever, chills, diaphoresis, activity change and fatigue.  HENT:       Occasional pain rt in front of her rt ear.   Respiratory: Positive for shortness of breath. Negative for cough and chest tightness.        Very rare occasional sob. Pt can't remember details of when. No associated chest pain. She has seen cardiologist in the past.  No sob of breath recently.  Cardiovascular: Negative for chest pain, palpitations and leg swelling.  Gastrointestinal: Negative for nausea, vomiting and abdominal pain.  Musculoskeletal: Negative for neck pain and neck stiffness.  Neurological: Negative for dizziness, tremors, seizures, syncope, facial asymmetry, speech difficulty, weakness, light-headedness, numbness and headaches.  Hematological: Negative for adenopathy. Does not bruise/bleed easily.  Psychiatric/Behavioral: Negative for behavioral problems, confusion  and agitation. The patient is not nervous/anxious.        Objective:   Physical Exam  General Mental Status- Alert. General Appearance- Not in acute distress.   Skin General: Color- Normal Color. Moisture- Normal Moisture.  Neck Carotid Arteries- Normal color. Moisture- Normal Moisture. No carotid bruits. No JVD.  Chest and Lung Exam Auscultation: Breath Sounds:-Normal. cta  Cardiovascular Auscultation:Rythm- Regular, Rate and Rhythm. Murmurs & Other Heart Sounds:Auscultation of the heart reveals- No Murmurs.  Abdomen Inspection:-Inspeection Normal. Palpation/Percussion:Note:No mass. Palpation and Percussion of the abdomen reveal- Non Tender, Non Distended + BS, no rebound or guarding.    Neurologic Cranial Nerve exam:- CN III-XII intact(No nystagmus), symmetric smile. Strength:- 5/5 equal and symmetric strength both upper and lower extremities.      Assessment & Plan:

## 2014-10-30 NOTE — Assessment & Plan Note (Signed)
Rare and transient. None recently but she states frequently in the past. Will get cxr today. When she has next episode. I want her to use albuterol inhaler and not if resolved quickly. Give me updat. Rx advisement given.

## 2014-11-15 ENCOUNTER — Ambulatory Visit (INDEPENDENT_AMBULATORY_CARE_PROVIDER_SITE_OTHER): Payer: BLUE CROSS/BLUE SHIELD | Admitting: Medical

## 2014-11-15 ENCOUNTER — Encounter: Payer: Self-pay | Admitting: Medical

## 2014-11-15 VITALS — BP 127/87 | HR 94 | Temp 98.7°F | Ht 64.0 in | Wt 207.6 lb

## 2014-11-15 DIAGNOSIS — J020 Streptococcal pharyngitis: Secondary | ICD-10-CM | POA: Diagnosis not present

## 2014-11-15 DIAGNOSIS — J029 Acute pharyngitis, unspecified: Secondary | ICD-10-CM | POA: Diagnosis not present

## 2014-11-15 LAB — POCT RAPID STREP A (OFFICE): Rapid Strep A Screen: POSITIVE — AB

## 2014-11-15 MED ORDER — CEFDINIR 300 MG PO CAPS: 300.0000 mg | ORAL_CAPSULE | Freq: Two times a day (BID) | ORAL | Status: DC

## 2014-11-15 NOTE — Patient Instructions (Addendum)
Your strep test was positive. I am prescribing  cefdnir antibiotic. Rest hydrate, tylenol for fever and warm salt water gargles. Follow up in 7 days or as needed.  Also some lt om. Cefdnir should be adequate for this as well.

## 2014-11-15 NOTE — Progress Notes (Signed)
Subjective:    Patient ID: Lorraine Conrad, female    DOB: February 06, 1971, 44 y.o.   MRN: [id number]  HPI  St for one week. Worse last 2 days along with rt ear pain past 2 days. No fever, no chills or sweats. No body aches. No known contacts with persons with st.  Review of Systems  Constitutional: Negative for fever, chills and fatigue.  HENT: Positive for ear pain and sore throat. Negative for mouth sores, postnasal drip and sinus pressure.   Respiratory: Negative for cough, chest tightness, shortness of breath and wheezing.   Cardiovascular: Negative for chest pain and palpitations.  Gastrointestinal: Negative for abdominal pain.  Musculoskeletal: Negative for back pain.  Hematological: Positive for adenopathy.  Psychiatric/Behavioral: Negative for behavioral problems and confusion.   Past Medical History  Diagnosis Date  . GERD (gastroesophageal reflux disease)   . Endometriosis   . Ovarian cyst, left   . External hemorrhoids   . Erosive gastritis   . Proctalgia fugax   . Allergy     SEASONAL  . Anemia   . OSA on CPAP   . Migraine 1998; 02/2014    "this one's lasted 10 days straight" (03/01/2014)  . Headache     "probably monthly" (03/01/2014)  . Arthritis     "spine" (03/01/2014)  . Chronic lower back pain     History   Social History  . Marital Status: Married    Spouse Name: N/A  . Number of Children: N/A  . Years of Education: N/A   Occupational History  . Not on file.   Social History Main Topics  . Smoking status: Never Smoker   . Smokeless tobacco: Never Used  . Alcohol Use: 0.0 oz/week    0 Standard drinks or equivalent per week     Comment: 03/01/2014 "I'll have 1-2 drinks maybe 3-4 times/yr"  . Drug Use: No  . Sexual Activity: Yes   Other Topics Concern  . Not on file   Social History Narrative   Daily caffiene use: 6 cups per day   Patient does not get regular excercise    Past Surgical History  Procedure Laterality Date  . Cesarean section   2002; 2006  . Temporomandibular joint surgery  ~ 1992  . Nasal septum surgery  ~ 1991  . Laparoscopic ovarian cystectomy  ~ 1999  . Upper gastrointestinal endoscopy    . Colonoscopy    . Abdominal hysterectomy  04/2013  . Breast biopsy Bilateral   . Breast lumpectomy Left ~ 1993    Family History  Problem Relation Age of Onset  . Breast cancer Maternal Grandmother   . Colon cancer Neg Hx   . Prostate cancer Paternal Grandfather   . Colon polyps Father   . Heart disease Father     Allergies  Allergen Reactions  . Promethazine Hcl Other (See Comments)    Violent tremors   . Codeine Other (See Comments)    Was a child when she took this.  . Dilaudid [Hydromorphone] Nausea And Vomiting  . Azithromycin Rash    Head to toe rash    Current Outpatient Prescriptions on File Prior to Visit  Medication Sig Dispense Refill  . acetaminophen (TYLENOL) 500 MG tablet Take 1 tablet (500 mg total) by mouth every 6 (six) hours as needed for mild pain or headache. For pain 30 tablet 0  . albuterol (PROVENTIL HFA;VENTOLIN HFA) 108 (90 BASE) MCG/ACT inhaler Inhale 2 puffs into the lungs every 6 (six)  hours as needed for wheezing or shortness of breath. 1 Inhaler 0  . diclofenac (VOLTAREN) 75 MG EC tablet Take 1 tablet (75 mg total) by mouth 2 (two) times daily. 30 tablet 1  . lansoprazole (PREVACID) 30 MG capsule Take 30 mg by mouth daily at 12 noon.    . metoprolol succinate (TOPROL XL) 25 MG 24 hr tablet Take 1 tablet (25 mg total) by mouth daily. 30 tablet 11  . ondansetron (ZOFRAN-ODT) 4 MG disintegrating tablet Take 4 mg by mouth every 8 (eight) hours as needed for nausea or vomiting.     No current facility-administered medications on file prior to visit.    BP 127/87 mmHg  Pulse 94  Temp(Src) 98.7 F (37.1 C) (Oral)  Ht 5\' 4"  (1.626 m)  Wt 207 lb 9.6 oz (94.167 kg)  BMI 35.62 kg/m2  SpO2 96%  LMP 02/08/2013       Objective:   Physical Exam  General- No acute distress,  pleasant pt.  Neck- from, No nuccal rigidity, Mild submandibular node hypertrophy.  Lungs- Clear even and unlabored.  Heart- Regular, rate and rhythm. HEENT- Head- normocephalic Eyes- PEERL bilaterally. Ears- Canals clear, lt tm normal. Rt tm mild dull red. Nose- No frontal or maxillary sinus tenderness to palpation. Turbinates normal. Throat- posterior pharynx shows  2+  tonsillar hypertrophy plus, moderate  erythma,  Mild white discharge.   Neurologic- CN III- XII grossly intact.      Assessment & Plan:  Your strep test was positive. I am prescribing  cefdnir antibiotic. Rest hydrate, tylenol for fever and warm salt water gargles. Follow up in 7 days or as needed.  Also some lt om. Cefdnir should be adequate for this as well.

## 2014-11-15 NOTE — Progress Notes (Signed)
Pre visit review using our clinic review tool, if applicable. No additional management support is needed unless otherwise documented below in the visit note. 

## 2014-11-20 ENCOUNTER — Ambulatory Visit: Payer: BLUE CROSS/BLUE SHIELD | Admitting: Medical

## 2014-11-27 ENCOUNTER — Ambulatory Visit: Payer: BLUE CROSS/BLUE SHIELD | Admitting: Medical

## 2014-12-12 ENCOUNTER — Encounter: Payer: Self-pay | Admitting: Medical

## 2014-12-12 ENCOUNTER — Ambulatory Visit (INDEPENDENT_AMBULATORY_CARE_PROVIDER_SITE_OTHER): Payer: BLUE CROSS/BLUE SHIELD | Admitting: Medical

## 2014-12-12 VITALS — BP 108/63 | HR 82 | Temp 98.4°F | Ht 64.0 in | Wt 209.8 lb

## 2014-12-12 DIAGNOSIS — J301 Allergic rhinitis due to pollen: Secondary | ICD-10-CM | POA: Diagnosis not present

## 2014-12-12 DIAGNOSIS — R0600 Dyspnea, unspecified: Secondary | ICD-10-CM | POA: Diagnosis not present

## 2014-12-12 DIAGNOSIS — J309 Allergic rhinitis, unspecified: Secondary | ICD-10-CM | POA: Insufficient documentation

## 2014-12-12 DIAGNOSIS — M542 Cervicalgia: Secondary | ICD-10-CM

## 2014-12-12 MED ORDER — LORATADINE 10 MG PO TABS: 10.0000 mg | ORAL_TABLET | Freq: Every day | ORAL | Status: DC

## 2014-12-12 MED ORDER — CYCLOBENZAPRINE HCL 10 MG PO TABS: 10.0000 mg | ORAL_TABLET | Freq: Every day | ORAL | Status: DC

## 2014-12-12 MED ORDER — MOMETASONE FUROATE 50 MCG/ACT NA SUSP
NASAL | Status: DC
Start: 2014-12-12 — End: 2015-05-10

## 2014-12-12 NOTE — Assessment & Plan Note (Signed)
Claritin rx. Nasonex rx. Update [place] if these symptoms are resolving.  By exam and hx strep not expected.

## 2014-12-12 NOTE — Progress Notes (Signed)
Pre visit review using our clinic review tool, if applicable. No additional management support is needed unless otherwise documented below in the visit note. 
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
## 2014-12-12 NOTE — Progress Notes (Signed)
Subjective:    Patient ID: Lorraine Conrad, female    DOB: 1971/03/26, 44 y.o.   MRN: [id number]  HPI  Pt in states recent mild sore throat after cleaning a house she in in process of buying had mild st(about Thursday or Friday of last week) . But not like strep per pt. Has some nasal congestion. Some sneezing. No fever, no chills or sweats.  Exposed to a lot of dust cleaning some blinds.  Pt not on any medications for allergies presently.  Pt states some mold remediation being done.     Review of Systems  Constitutional: Negative for fever, chills and fatigue.  HENT: Positive for congestion, postnasal drip, sneezing and sore throat.        Faint st only for one day. Then resolved last week.  Respiratory: Negative for apnea, cough, choking and wheezing.        1 week ago exposure to some smoke(from cigarette) and felt sob. She did not have inhaler with her at the time. 3 episodes of sob in past. She never had albuterol avaible.  Cardiovascular: Negative for chest pain and palpitations.  Musculoskeletal: Negative for back pain.  Neurological: Negative for dizziness, seizures, syncope, weakness, light-headedness and headaches.  Hematological: Negative for adenopathy. Does not bruise/bleed easily.  Psychiatric/Behavioral: Negative for behavioral problems and confusion.     Past Medical History  Diagnosis Date  . GERD (gastroesophageal reflux disease)   . Endometriosis   . Ovarian cyst, left   . External hemorrhoids   . Erosive gastritis   . Proctalgia fugax   . Allergy     SEASONAL  . Anemia   . OSA on CPAP   . Migraine 1998; 02/2014    "this one's lasted 10 days straight" (03/01/2014)  . Headache     "probably monthly" (03/01/2014)  . Arthritis     "spine" (03/01/2014)  . Chronic lower back pain     History   Social History  . Marital Status: Married    Spouse Name: N/A  . Number of Children: N/A  . Years of Education: N/A   Occupational History  . Not on file.     Social History Main Topics  . Smoking status: Never Smoker   . Smokeless tobacco: Never Used  . Alcohol Use: 0.0 oz/week    0 Standard drinks or equivalent per week     Comment: 03/01/2014 "I'll have 1-2 drinks maybe 3-4 times/yr"  . Drug Use: No  . Sexual Activity: Yes   Other Topics Concern  . Not on file   Social History Narrative   Daily caffiene use: 6 cups per day   Patient does not get regular excercise    Past Surgical History  Procedure Laterality Date  . Cesarean section  2002; 2006  . Temporomandibular joint surgery  ~ 1992  . Nasal septum surgery  ~ 1991  . Laparoscopic ovarian cystectomy  ~ 1999  . Upper gastrointestinal endoscopy    . Colonoscopy    . Abdominal hysterectomy  04/2013  . Breast biopsy Bilateral   . Breast lumpectomy Left ~ 1993    Family History  Problem Relation Age of Onset  . Breast cancer Maternal Grandmother   . Colon cancer Neg Hx   . Prostate cancer Paternal Grandfather   . Colon polyps Father   . Heart disease Father     Allergies  Allergen Reactions  . Promethazine Hcl Other (See Comments)    Violent tremors   .  Codeine Other (See Comments)    Was a child when she took this.  . Dilaudid [Hydromorphone] Nausea And Vomiting  . Azithromycin Rash    Head to toe rash    Current Outpatient Prescriptions on File Prior to Visit  Medication Sig Dispense Refill  . acetaminophen (TYLENOL) 500 MG tablet Take 1 tablet (500 mg total) by mouth every 6 (six) hours as needed for mild pain or headache. For pain 30 tablet 0  . lansoprazole (PREVACID) 30 MG capsule Take 30 mg by mouth daily at 12 noon.    . ondansetron (ZOFRAN-ODT) 4 MG disintegrating tablet Take 4 mg by mouth every 8 (eight) hours as needed for nausea or vomiting.    Marland Kitchen albuterol (PROVENTIL HFA;VENTOLIN HFA) 108 (90 BASE) MCG/ACT inhaler Inhale 2 puffs into the lungs every 6 (six) hours as needed for wheezing or shortness of breath. (Patient not taking: Reported on  12/12/2014) 1 Inhaler 0  . diclofenac (VOLTAREN) 75 MG EC tablet Take 1 tablet (75 mg total) by mouth 2 (two) times daily. 30 tablet 1  . metoprolol succinate (TOPROL XL) 25 MG 24 hr tablet Take 1 tablet (25 mg total) by mouth daily. (Patient not taking: Reported on 12/12/2014) 30 tablet 11   No current facility-administered medications on file prior to visit.    BP 108/63 mmHg  Pulse 82  Temp(Src) 98.4 F (36.9 C) (Oral)  Ht 5\' 4"  (1.626 m)  Wt 209 lb 12.8 oz (95.165 kg)  BMI 35.99 kg/m2  SpO2 94%  LMP 02/08/2013       Objective:   Physical Exam  General  Mental Status - Alert. General Appearance - Well groomed. Not in acute distress.  Skin Rashes- No Rashes.  HEENT Head- Normal. Ear Auditory Canal - Left- Normal. Right - Normal.Tympanic Membrane- Left- Normal. Right- Normal. Eye Sclera/Conjunctiva- Left- Normal. Right- Normal. Nose & Sinuses Nasal Mucosa- Left-  Boggy and Congested. Right-  Boggy and  Congested.Bilateral no maxillary and  No frontal sinus pressure. Mouth & Throat Lips: Upper Lip- Normal: no dryness, cracking, pallor, cyanosis, or vesicular eruption. Lower Lip-Normal: no dryness, cracking, pallor, cyanosis or vesicular eruption. Buccal Mucosa- Bilateral- No Aphthous ulcers. Oropharynx- No Discharge or Erythema. +pnd. Tonsils: Characteristics- Bilateral- No Erythema or Congestion. Size/Enlargement- Bilateral- No enlargement. Discharge- bilateral-None.  Neck Neck- Supple. No Masses.   Chest and Lung Exam Auscultation: Breath Sounds:-Clear even and unlabored.  Cardiovascular Auscultation:Rythm- Regular, rate and rhythm. Murmurs & Other Heart Sounds:Ausculatation of the heart reveal- No Murmurs.  Lymphatic Head & Neck General Head & Neck Lymphatics: Bilateral: Description- No Localized lymphadenopathy.       Assessment & Plan:
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
## 2014-12-27 ENCOUNTER — Encounter: Payer: Self-pay | Admitting: *Deleted

## 2015-02-01 ENCOUNTER — Encounter: Payer: Self-pay | Admitting: Medical

## 2015-02-01 ENCOUNTER — Ambulatory Visit (INDEPENDENT_AMBULATORY_CARE_PROVIDER_SITE_OTHER): Payer: BLUE CROSS/BLUE SHIELD | Admitting: Medical

## 2015-02-01 VITALS — BP 118/80 | HR 89 | Temp 97.8°F | Resp 16 | Ht 64.0 in | Wt 212.2 lb

## 2015-02-01 DIAGNOSIS — L600 Ingrowing nail: Secondary | ICD-10-CM | POA: Diagnosis not present

## 2015-02-01 DIAGNOSIS — L089 Local infection of the skin and subcutaneous tissue, unspecified: Secondary | ICD-10-CM

## 2015-02-01 MED ORDER — CEPHALEXIN 500 MG PO CAPS: 500.0000 mg | ORAL_CAPSULE | Freq: Two times a day (BID) | ORAL | Status: DC
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
## 2015-02-01 NOTE — Patient Instructions (Addendum)
For your ingrown toenail please do not cut or manipulate toenail over next week.  Warm salt water soaks twice daily.  Cephalexin rx. Ibuprofen for pain.  If by Tuesday next still has pain then may refer to podiatrist.  Regarding her rare intermittent sob and rapid response to albuterol this may be mild asthma. Please document [place] and frequency of albuterol use. Update [place] in 3 wks how often have used. If using more than 2 times a week. Would recommend use qvar.

## 2015-02-01 NOTE — Progress Notes (Signed)
Pre visit review using our clinic review tool, if applicable. No additional management support is needed unless otherwise documented below in the visit note. 

## 2015-02-07 ENCOUNTER — Encounter: Payer: Self-pay | Admitting: Internal Medicine

## 2015-02-08 ENCOUNTER — Telehealth: Payer: Self-pay | Admitting: Medical

## 2015-02-08 DIAGNOSIS — L600 Ingrowing nail: Secondary | ICD-10-CM

## 2015-02-08 NOTE — Telephone Encounter (Signed)
Pt called stating Percell Miller told her to call if toes was not getting better within 1 week and he would refer to podiatry. Pt states there is very little improvement. She said it is still sore and tender.

## 2015-02-08 NOTE — Telephone Encounter (Signed)
Pt was seen 02-01-15. Please advise.

## 2015-02-09 NOTE — Telephone Encounter (Signed)
Referal placed for ingrown toenail.

## 2015-02-09 NOTE — Telephone Encounter (Signed)
Pt called to check on status of referral.

## 2015-02-13 ENCOUNTER — Telehealth: Payer: Self-pay | Admitting: Medical

## 2015-02-13 NOTE — Telephone Encounter (Signed)
Relation to IT:GPQD Call back number:[phone] Pharmacy:  Reason for call:  Patient is requesting a refill cephALEXin (KEFLEX) 500 MG capsule

## 2015-02-13 NOTE — Telephone Encounter (Signed)
Spoke with pt. She states toe no longer has pus type discharge but remains red and swollen. States she stopped cephalexin on Thursday because she was itching all over and having diarrhea. Wants to know what she should do?

## 2015-02-14 ENCOUNTER — Telehealth: Payer: Self-pay | Admitting: Medical

## 2015-02-14 NOTE — Telephone Encounter (Signed)
Advise pt to attend podiatrist appointment. Bland foods, hydrate and immodium for diarrhea. If by Thursday or Friday any diarrhea then needs to be seen. She was on keflex. So c dif is possible. So would advise these measures and if not better be seen. For her toe.

## 2015-02-14 NOTE — Telephone Encounter (Signed)
Called patient-voicemail not set up

## 2015-02-14 NOTE — Telephone Encounter (Signed)
Pt is returning your call.   CB#: [phone]

## 2015-02-14 NOTE — Telephone Encounter (Signed)
I think she should be seen back in the office please.

## 2015-02-14 NOTE — Telephone Encounter (Signed)
Spoke with pt and she voice understanding. Pt is coming in for follow up Friday.

## 2015-02-14 NOTE — Telephone Encounter (Signed)
Notified pt, Lorraine Conrad has no availability on schedule today and pt wants to know if he agrees with below recommendation ("since she originally saw him for her concern") or does he suggest something else? Please advise.

## 2015-02-14 NOTE — Telephone Encounter (Signed)
Pt is coming in for appt Friday.

## 2015-02-16 ENCOUNTER — Encounter: Payer: Self-pay | Admitting: Medical

## 2015-02-16 ENCOUNTER — Ambulatory Visit (INDEPENDENT_AMBULATORY_CARE_PROVIDER_SITE_OTHER): Payer: BLUE CROSS/BLUE SHIELD | Admitting: Medical

## 2015-02-16 VITALS — BP 120/80 | HR 86 | Temp 98.2°F | Resp 16 | Ht 64.0 in | Wt 211.2 lb

## 2015-02-16 DIAGNOSIS — L600 Ingrowing nail: Secondary | ICD-10-CM | POA: Diagnosis not present

## 2015-02-16 MED ORDER — DOXYCYCLINE HYCLATE 100 MG PO TABS: 100.0000 mg | ORAL_TABLET | Freq: Two times a day (BID) | ORAL | Status: DC

## 2015-02-16 NOTE — Progress Notes (Signed)
Pre visit review using our clinic review tool, if applicable. No additional management support is needed unless otherwise documented below in the visit note. 

## 2015-02-16 NOTE — Patient Instructions (Signed)
Ingrown toenail with early infection. Rx doxycycline. Use probiotics while on. Warm salt water soaks twice a day. Low dose ibuprofen for pain.  You are hesitant to get portion of nail removed by podiatrist.But can re-refer you back to another podiatrist if this is not resolving or re-occurs quickly.  Follow as needed.

## 2015-02-16 NOTE — Progress Notes (Signed)
   Subjective:    Patient ID: Lorraine Conrad, female    DOB: 1970/12/07, 44 y.o.   MRN: [id number]  HPI  Pt in states her toe still hurts. Pt was on keflex but got itch with this and got diarrhea. The diarrhea resolved for 2 days. No prior reaction to keflex.  I was treating ingrown toenail with infection. Pt went to the podiatrist on February 13, 2015. They recommended and offered removing small portion of the nail on  lateral and medial aspect. Pt declined the procedure.   On Tuesday had moderate pain lateral aspect of left great toe. Now less. But still distal tip of toe tender. No DC.  Review of Systems  Musculoskeletal:       Lt toe pain at tip and lateral aspect.        Objective:   Physical Exam General-nad.  Lt foot- normal. But great toe has high arched nail. Sides look ingrown. Lateral aspect is worse. Tip of toe is very tender. Faint reddish pink appearance.     Assessment & Plan:  ngrown toenail with early infection. Rx doxycycline. Use probiotics while on. Warm salt water soaks twice a day. Low dose ibuprofen for pain.  You are hesitant to get portion of nail removed by podiatrist.But can re-refer you back to another podiatrist if this is not resolving or re-occurs quickly.  Follow as needed.

## 2015-02-28 ENCOUNTER — Ambulatory Visit (INDEPENDENT_AMBULATORY_CARE_PROVIDER_SITE_OTHER): Payer: BLUE CROSS/BLUE SHIELD | Admitting: Medical

## 2015-02-28 ENCOUNTER — Inpatient Hospital Stay (HOSPITAL_COMMUNITY)
Admission: EM | Admit: 2015-02-28 | Discharge: 2015-03-01 | DRG: 552 | Disposition: A | Discharge status: Home / Self Care | Payer: BLUE CROSS/BLUE SHIELD | Attending: Internal Medicine | Admitting: Internal Medicine

## 2015-02-28 ENCOUNTER — Emergency Department (HOSPITAL_COMMUNITY): Payer: BLUE CROSS/BLUE SHIELD

## 2015-02-28 ENCOUNTER — Encounter (HOSPITAL_COMMUNITY): Payer: Self-pay | Admitting: Family Medicine

## 2015-02-28 ENCOUNTER — Encounter: Payer: Self-pay | Admitting: Medical

## 2015-02-28 VITALS — BP 124/80 | HR 87 | Temp 98.1°F | Ht 64.0 in | Wt 210.8 lb

## 2015-02-28 DIAGNOSIS — M25569 Pain in unspecified knee: Secondary | ICD-10-CM | POA: Diagnosis not present

## 2015-02-28 DIAGNOSIS — R29898 Other symptoms and signs involving the musculoskeletal system: Secondary | ICD-10-CM

## 2015-02-28 DIAGNOSIS — Z888 Allergy status to other drugs, medicaments and biological substances status: Secondary | ICD-10-CM

## 2015-02-28 DIAGNOSIS — K219 Gastro-esophageal reflux disease without esophagitis: Secondary | ICD-10-CM | POA: Diagnosis present

## 2015-02-28 DIAGNOSIS — K589 Irritable bowel syndrome without diarrhea: Secondary | ICD-10-CM | POA: Diagnosis present

## 2015-02-28 DIAGNOSIS — R0602 Shortness of breath: Secondary | ICD-10-CM | POA: Diagnosis present

## 2015-02-28 DIAGNOSIS — M545 Low back pain: Secondary | ICD-10-CM | POA: Diagnosis present

## 2015-02-28 DIAGNOSIS — L409 Psoriasis, unspecified: Secondary | ICD-10-CM | POA: Diagnosis present

## 2015-02-28 DIAGNOSIS — N8320 Unspecified ovarian cysts: Secondary | ICD-10-CM | POA: Diagnosis present

## 2015-02-28 DIAGNOSIS — E669 Obesity, unspecified: Secondary | ICD-10-CM | POA: Diagnosis present

## 2015-02-28 DIAGNOSIS — M5136 Other intervertebral disc degeneration, lumbar region: Secondary | ICD-10-CM | POA: Diagnosis not present

## 2015-02-28 DIAGNOSIS — M4696 Unspecified inflammatory spondylopathy, lumbar region: Secondary | ICD-10-CM | POA: Diagnosis present

## 2015-02-28 DIAGNOSIS — G4733 Obstructive sleep apnea (adult) (pediatric): Secondary | ICD-10-CM | POA: Diagnosis present

## 2015-02-28 DIAGNOSIS — J302 Other seasonal allergic rhinitis: Secondary | ICD-10-CM | POA: Diagnosis present

## 2015-02-28 DIAGNOSIS — R079 Chest pain, unspecified: Secondary | ICD-10-CM | POA: Diagnosis present

## 2015-02-28 DIAGNOSIS — Z90710 Acquired absence of both cervix and uterus: Secondary | ICD-10-CM

## 2015-02-28 DIAGNOSIS — M79609 Pain in unspecified limb: Secondary | ICD-10-CM

## 2015-02-28 DIAGNOSIS — Z885 Allergy status to narcotic agent status: Secondary | ICD-10-CM

## 2015-02-28 DIAGNOSIS — E538 Deficiency of other specified B group vitamins: Secondary | ICD-10-CM | POA: Diagnosis present

## 2015-02-28 DIAGNOSIS — Z6835 Body mass index (BMI) 35.0-35.9, adult: Secondary | ICD-10-CM

## 2015-02-28 DIAGNOSIS — R531 Weakness: Secondary | ICD-10-CM | POA: Diagnosis present

## 2015-02-28 DIAGNOSIS — R519 Headache, unspecified: Secondary | ICD-10-CM

## 2015-02-28 DIAGNOSIS — R0600 Dyspnea, unspecified: Secondary | ICD-10-CM | POA: Diagnosis not present

## 2015-02-28 DIAGNOSIS — Z8661 Personal history of infections of the central nervous system: Secondary | ICD-10-CM

## 2015-02-28 DIAGNOSIS — M62838 Other muscle spasm: Secondary | ICD-10-CM | POA: Diagnosis present

## 2015-02-28 DIAGNOSIS — R300 Dysuria: Secondary | ICD-10-CM | POA: Diagnosis present

## 2015-02-28 DIAGNOSIS — G8929 Other chronic pain: Secondary | ICD-10-CM | POA: Diagnosis present

## 2015-02-28 DIAGNOSIS — Z79899 Other long term (current) drug therapy: Secondary | ICD-10-CM

## 2015-02-28 DIAGNOSIS — Z881 Allergy status to other antibiotic agents status: Secondary | ICD-10-CM

## 2015-02-28 DIAGNOSIS — K594 Anal spasm: Secondary | ICD-10-CM | POA: Diagnosis present

## 2015-02-28 DIAGNOSIS — D649 Anemia, unspecified: Secondary | ICD-10-CM | POA: Diagnosis present

## 2015-02-28 DIAGNOSIS — D72829 Elevated white blood cell count, unspecified: Secondary | ICD-10-CM | POA: Diagnosis present

## 2015-02-28 DIAGNOSIS — R51 Headache: Secondary | ICD-10-CM

## 2015-02-28 DIAGNOSIS — G43909 Migraine, unspecified, not intractable, without status migrainosus: Secondary | ICD-10-CM | POA: Diagnosis present

## 2015-02-28 DIAGNOSIS — M549 Dorsalgia, unspecified: Secondary | ICD-10-CM | POA: Diagnosis present

## 2015-02-28 LAB — CBG MONITORING, ED: Glucose-Capillary: 88 mg/dL (ref 65–99)

## 2015-02-28 LAB — COMPREHENSIVE METABOLIC PANEL
ALBUMIN: 3.7 g/dL (ref 3.5–5.0)
ALT: 14 U/L (ref 14–54)
AST: 15 U/L (ref 15–41)
Alkaline Phosphatase: 84 U/L (ref 38–126)
Anion gap: 7 (ref 5–15)
BUN: 7 mg/dL (ref 6–20)
CO2: 27 mmol/L (ref 22–32)
Calcium: 9.6 mg/dL (ref 8.9–10.3)
Chloride: 105 mmol/L (ref 101–111)
Creatinine, Ser: 0.96 mg/dL (ref 0.44–1.00)
GFR calc Af Amer: >60 mL/min (ref >60)
Glucose, Bld: 93 mg/dL (ref 65–99)
POTASSIUM: 4.1 mmol/L (ref 3.5–5.1)
SODIUM: 139 mmol/L (ref 135–145)
Total Bilirubin: 0.3 mg/dL (ref 0.3–1.2)
Total Protein: 7.1 g/dL (ref 6.5–8.1)

## 2015-02-28 LAB — CBC WITH DIFFERENTIAL/PLATELET
BASOS ABS: 0 10*3/uL (ref 0.0–0.1)
BASOS PCT: 0 %
EOS PCT: 2 %
Eosinophils Absolute: 0.2 10*3/uL (ref 0.0–0.7)
HCT: 41 % (ref 36.0–46.0)
Hemoglobin: 13.5 g/dL (ref 12.0–15.0)
Lymphocytes Relative: 21 %
Lymphs Abs: 2.6 10*3/uL (ref 0.7–4.0)
MCH: 28.4 pg (ref 26.0–34.0)
MCHC: 32.9 g/dL (ref 30.0–36.0)
MCV: 86.3 fL (ref 78.0–100.0)
Monocytes Absolute: 0.8 10*3/uL (ref 0.1–1.0)
Monocytes Relative: 6 %
Neutro Abs: 8.7 10*3/uL — ABNORMAL HIGH (ref 1.7–7.7)
Neutrophils Relative %: 71 %
PLATELETS: 430 10*3/uL — AB (ref 150–400)
RBC: 4.75 MIL/uL (ref 3.87–5.11)
RDW: 13.9 % (ref 11.5–15.5)
WBC: 12.3 10*3/uL — AB (ref 4.0–10.5)

## 2015-02-28 LAB — I-STAT TROPONIN, ED: TROPONIN I, POC: 0 ng/mL (ref 0.00–0.08)

## 2015-02-28 LAB — TSH: TSH: 2.226 u[IU]/mL (ref 0.350–4.500)

## 2015-02-28 MED ORDER — SODIUM CHLORIDE 0.9 % IV BOLUS (SEPSIS)
1000.0000 mL | Freq: Once | INTRAVENOUS | Status: AC
  Administered 2015-02-28: 1000 mL via INTRAVENOUS

## 2015-02-28 MED ORDER — MORPHINE SULFATE (PF) 4 MG/ML IV SOLN
4.0000 mg | Freq: Once | INTRAVENOUS | Status: AC
  Administered 2015-02-28: 4 mg via INTRAVENOUS
  Filled 2015-02-28: qty 1

## 2015-02-28 NOTE — ED Notes (Addendum)
Pt here for lower back pain, leg pain, bilateral leg weakness(more on the left). sts headache and pain radiating into neck. sts when she walks her legs drag.

## 2015-02-28 NOTE — Progress Notes (Signed)
Subjective:    Patient ID: Lorraine Conrad, female    DOB: Nov 29, 1970, 44 y.o.   MRN: [id number]  HPI  Pt in states she had some bilateral leg region pain. Worst on the left side than rt side. She woke up Monday am and rolled in bed and had excrutiating lt knee  pain. Next day pain went away. But some slight pain behind both knees/popliteal pain since.  On way to office she had bilateral calf pain.  Pt has some recent sob episodes this am. Yesterday felt sob sensation all day. Described yesteday sob as prominen.  Pt had mentioned this in the past that was transient short of breath episodes. I gave albuterol inhaler as trial in past and she states it did help. But not recently.  Around mid summer she mentioned some pressure sensation in in occipital and on top of head. This has been case since summer. States if laughs or cough head hurts. No neck tightness and no gross motor or sensory or sensory function deficits. Some times even light laugh will give head pressure sensation.  Acute onset of very abnormal gait today. Pt demonstrates toward end of the exam she has difficulty walking.    Review of Systems  Constitutional: Negative for fever, chills, diaphoresis, activity change and fatigue.  Respiratory: Positive for shortness of breath. Negative for cough and chest tightness.   Cardiovascular: Negative for chest pain, palpitations and leg swelling.  Gastrointestinal: Negative for nausea, vomiting and abdominal pain.  Musculoskeletal: Positive for back pain. Negative for neck pain and neck stiffness.       Leg pain  After end of exam. She did mention briefly lower back tightness last night.  Neurological: Negative for dizziness, tremors, seizures, syncope, facial asymmetry, speech difficulty, weakness, light-headedness, numbness and headaches.       Head pressure. Abnormal gait. Legs weak and painful.  Psychiatric/Behavioral: Negative for behavioral problems, confusion and agitation. The  patient is not nervous/anxious.    Past Medical History  Diagnosis Date  . GERD (gastroesophageal reflux disease)   . Endometriosis   . Ovarian cyst, left   . External hemorrhoids   . Erosive gastritis   . Proctalgia fugax   . Allergy     SEASONAL  . Anemia   . OSA on CPAP   . Migraine 1998; 02/2014    "this one's lasted 10 days straight" (03/01/2014)  . Headache     "probably monthly" (03/01/2014)  . Arthritis     "spine" (03/01/2014)  . Chronic lower back pain   . H/O shortness of breath 2014    Cardiopulmonary exercise test results    Social History   Social History  . Marital Status: Married    Spouse Name: N/A  . Number of Children: N/A  . Years of Education: N/A   Occupational History  . Not on file.   Social History Main Topics  . Smoking status: Never Smoker   . Smokeless tobacco: Never Used  . Alcohol Use: 0.0 oz/week    0 Standard drinks or equivalent per week     Comment: 03/01/2014 "I'll have 1-2 drinks maybe 3-4 times/yr"  . Drug Use: No  . Sexual Activity: Yes   Other Topics Concern  . Not on file   Social History Narrative   Daily caffiene use: 6 cups per day   Patient does not get regular excercise    Past Surgical History  Procedure Laterality Date  . Cesarean section  2002; 2006  .  Temporomandibular joint surgery  ~ 1992  . Nasal septum surgery  ~ 1991  . Laparoscopic ovarian cystectomy  ~ 1999  . Upper gastrointestinal endoscopy    . Colonoscopy    . Abdominal hysterectomy  04/2013  . Breast biopsy Bilateral   . Breast lumpectomy Left ~ 1993    Family History  Problem Relation Age of Onset  . Breast cancer Maternal Grandmother   . Colon cancer Neg Hx   . Prostate cancer Paternal Grandfather   . Colon polyps Father   . Heart disease Father     Allergies  Allergen Reactions  . Promethazine Hcl Other (See Comments)    Violent tremors   . Cephalexin Diarrhea and Itching  . Codeine Other (See Comments)    Was a child when she  took this.  . Dilaudid [Hydromorphone] Nausea And Vomiting  . Azithromycin Rash    Head to toe rash    Current Outpatient Prescriptions on File Prior to Visit  Medication Sig Dispense Refill  . acetaminophen (TYLENOL) 500 MG tablet Take 1 tablet (500 mg total) by mouth every 6 (six) hours as needed for mild pain or headache. For pain 30 tablet 0  . albuterol (PROVENTIL HFA;VENTOLIN HFA) 108 (90 BASE) MCG/ACT inhaler Inhale 2 puffs into the lungs every 6 (six) hours as needed for wheezing or shortness of breath. 1 Inhaler 0  . cyclobenzaprine (FLEXERIL) 10 MG tablet Take 1 tablet (10 mg total) by mouth at bedtime. 10 tablet 1  . diclofenac (VOLTAREN) 75 MG EC tablet Take 1 tablet (75 mg total) by mouth 2 (two) times daily. 30 tablet 1  . doxycycline (VIBRA-TABS) 100 MG tablet Take 1 tablet (100 mg total) by mouth 2 (two) times daily. 14 tablet 0  . lansoprazole (PREVACID) 30 MG capsule Take 30 mg by mouth daily at 12 noon.    . loratadine (CLARITIN) 10 MG tablet Take 1 tablet (10 mg total) by mouth daily. 30 tablet 0  . metoprolol succinate (TOPROL XL) 25 MG 24 hr tablet Take 1 tablet (25 mg total) by mouth daily. 30 tablet 11  . mometasone (NASONEX) 50 MCG/ACT nasal spray 2 sprays each nostril q day 17 g 1  . ondansetron (ZOFRAN-ODT) 4 MG disintegrating tablet Take 4 mg by mouth every 8 (eight) hours as needed for nausea or vomiting.     No current facility-administered medications on file prior to visit.    BP 124/80 mmHg  Pulse 87  Temp(Src) 98.1 F (36.7 C) (Oral)  Ht 5\' 4"  (1.626 m)  Wt 210 lb 12.8 oz (95.618 kg)  BMI 36.17 kg/m2  SpO2 99%  LMP 02/08/2013       Objective:   Physical Exam  General Mental Status- Alert. General Appearance- Not in acute distress.   Skin General: Color- Normal Color. Moisture- Normal Moisture.  Neck Carotid Arteries- Normal color. Moisture- Normal Moisture. No carotid bruits. No JVD.  Chest and Lung Exam Auscultation: Breath  Sounds:-Normal.  Cardiovascular Auscultation:Rythm- Regular. Murmurs & Other Heart Sounds:Auscultation of the heart reveals- No Murmurs.  Abdomen Inspection:-Inspeection Normal. Palpation/Percussion:Note:No mass. Palpation and Percussion of the abdomen reveal- Non Tender, Non Distended + BS, no rebound or guarding.    Neurologic Cranial Nerve exam:- CN III-XII intact(No nystagmus), symmetric smile. Strength:- 5/5 equal and symmetric strength both upper and lower extremities. But on attempted ambulation demonstrated leg weakness and difficulty walking.  Lower ext- calves symmetric. Negative homans sign(but reports today popliteal pain)      Assessment &  Plan:  With you popliteal pain, dyspnea, head pressure sensation and  acute bilateral leg weakness(extreme difficulty ambulating). I recommend evaluation at [facility] ED. I think this may be better option if extensive imaging studies or specialist consult is needed. I recommend you give this summary to Triage staff. Follow up here as needed.  Note I had planned to out patient stat lower ext dopplers, considered d-dimer as well. Was going to try Ct of head as outpt after getting prior auth but after seeing her acute onset abnormal gate recommended ED eval.  Follow up here post ED evaluation

## 2015-02-28 NOTE — ED Provider Notes (Signed)
CSN: [id number]     Arrival date & time 02/28/15  1717 History   First MD Initiated Contact with Patient 02/28/15 1932     Chief Complaint  Patient presents with  . Back Pain  . Extremity Weakness  . Headache   Patient is a 44 y.o. female presenting with general illness. The history is provided by the patient. No language interpreter was used.  Illness Location:  Lower extremities Quality:  Weakness Severity:  Moderate Onset quality:  Gradual Timing:  Constant Progression:  Worsening Chronicity:  New Context:  PMHx of external hemorrhoids, proctalgia fugax, endometriosis, & chronic lower back pain presenting with back pain & leg weakness. SOB onset x3 days ago. Used inhaler at home with minimal relief. Acute on chronic lower back pain described as tightness last ngiht but no new inciting event or injury. Bilateral LE weakness onset today worse on L. Denies vision changes, slurred speech, facial droop, urinary retention, urinary/bowel incontinence, or saddle anesthesia. Associated symptoms: headaches and shortness of breath   Associated symptoms: no abdominal pain, no diarrhea, no fever, no loss of consciousness, no nausea and no vomiting     Past Medical History  Diagnosis Date  . GERD (gastroesophageal reflux disease)   . Endometriosis   . Ovarian cyst, left   . External hemorrhoids   . Erosive gastritis   . Proctalgia fugax   . Allergy     SEASONAL  . Anemia   . OSA on CPAP   . Migraine 1998; 02/2014    "this one's lasted 10 days straight" (03/01/2014)  . Headache     "probably monthly" (03/01/2014)  . Arthritis     "spine" (03/01/2014)  . Chronic lower back pain   . H/O shortness of breath 2014    Cardiopulmonary exercise test results   Past Surgical History  Procedure Laterality Date  . Cesarean section  2002; 2006  . Temporomandibular joint surgery  ~ 1992  . Nasal septum surgery  ~ 1991  . Laparoscopic ovarian cystectomy  ~ 1999  . Upper gastrointestinal endoscopy     . Colonoscopy    . Abdominal hysterectomy  04/2013  . Breast biopsy Bilateral   . Breast lumpectomy Left ~ 1993   Family History  Problem Relation Age of Onset  . Breast cancer Maternal Grandmother   . Colon cancer Neg Hx   . Prostate cancer Paternal Grandfather   . Colon polyps Father   . Heart disease Father    Social History  Substance Use Topics  . Smoking status: Never Smoker   . Smokeless tobacco: Never Used  . Alcohol Use: 0.0 oz/week    0 Standard drinks or equivalent per week     Comment: 03/01/2014 "I'll have 1-2 drinks maybe 3-4 times/yr"   OB History    No data available      Review of Systems  Constitutional: Positive for chills (baseline, unchanged). Negative for fever.  Respiratory: Positive for shortness of breath.   Gastrointestinal: Negative for nausea, vomiting, abdominal pain and diarrhea.  Musculoskeletal: Positive for back pain and arthralgias.  Neurological: Positive for weakness and headaches. Negative for dizziness, tremors, seizures, loss of consciousness, syncope, facial asymmetry, speech difficulty and light-headedness.  All other systems reviewed and are negative.   Allergies  Promethazine hcl; Cephalexin; Codeine; Dilaudid; and Azithromycin  Home Medications   Prior to Admission medications   Medication Sig Start Date End Date Taking? Authorizing Provider  acetaminophen (TYLENOL) 500 MG tablet Take 1 tablet (500 mg  total) by mouth every 6 (six) hours as needed for mild pain or headache. For pain 03/04/14  Yes Domenic Polite, MD  albuterol (PROVENTIL HFA;VENTOLIN HFA) 108 (90 BASE) MCG/ACT inhaler Inhale 2 puffs into the lungs every 6 (six) hours as needed for wheezing or shortness of breath. 10/30/14  Yes Mackie Pai, PA-C  cyclobenzaprine (FLEXERIL) 10 MG tablet Take 1 tablet (10 mg total) by mouth at bedtime. 12/12/14  Yes Edward Saguier, PA-C  diclofenac (VOLTAREN) 75 MG EC tablet Take 1 tablet (75 mg total) by mouth 2 (two) times daily.  10/30/14  Yes Edward Saguier, PA-C  lansoprazole (PREVACID) 30 MG capsule Take 30 mg by mouth daily at 12 noon.   Yes Historical Provider, MD  loratadine (CLARITIN) 10 MG tablet Take 1 tablet (10 mg total) by mouth daily. 12/12/14  Yes Edward Saguier, PA-C  mometasone (NASONEX) 50 MCG/ACT nasal spray 2 sprays each nostril q day 12/12/14  Yes Edward Saguier, PA-C  doxycycline (VIBRA-TABS) 100 MG tablet Take 1 tablet (100 mg total) by mouth 2 (two) times daily. Patient not taking: Reported on 02/28/2015 02/16/15   Mackie Pai, PA-C   BP 129/89 mmHg  Pulse 74  Temp(Src) 98.1 F (36.7 C) (Oral)  Resp 22  Ht 5\' 4"  (1.626 m)  Wt 211 lb (95.709 kg)  BMI 36.20 kg/m2  SpO2 100%  LMP 02/08/2013   Physical Exam  Constitutional: She is oriented to person, place, and time. No distress.  HENT:  Head: Normocephalic and atraumatic.  Mouth/Throat: Oropharynx is clear and moist.  Eyes: Conjunctivae are normal. Pupils are equal, round, and reactive to light.  Neck: Normal range of motion. Neck supple.  Cardiovascular: Normal rate, regular rhythm and intact distal pulses.   Pulmonary/Chest: Effort normal and breath sounds normal. No respiratory distress. She has no wheezes.  Abdominal: Soft. Bowel sounds are normal. She exhibits no distension. There is no tenderness.  Musculoskeletal: Normal range of motion. She exhibits no edema.  Neurological: She is alert and oriented to person, place, and time. She has normal reflexes. She displays normal reflexes. No cranial nerve deficit. She exhibits normal muscle tone.  Neuro exam notable for 4+/5 RLE & 4-/5 LLE but otherwise non-focal  Skin: Skin is warm and dry. She is not diaphoretic.  Nursing note and vitals reviewed.   ED Course  Procedures   Labs Review Labs Reviewed  CBC WITH DIFFERENTIAL/PLATELET - Abnormal; Notable for the following:    WBC 12.3 (*)    Platelets 430 (*)    Neutro Abs 8.7 (*)    All other components within normal limits   URINALYSIS, ROUTINE W REFLEX MICROSCOPIC (NOT AT [facility]) - Abnormal; Notable for the following:    Hgb urine dipstick SMALL (*)    All other components within normal limits  URINE RAPID DRUG SCREEN, HOSP PERFORMED - Abnormal; Notable for the following:    Opiates POSITIVE (*)    All other components within normal limits  URINE MICROSCOPIC-ADD ON - Abnormal; Notable for the following:    Squamous Epithelial / LPF FEW (*)    All other components within normal limits  COMPREHENSIVE METABOLIC PANEL  TSH  CBG MONITORING, ED  I-STAT TROPOININ, ED    Imaging Review Dg Chest 2 View  02/28/2015   CLINICAL DATA:  Difficulty breathing and catching breath since Monday morning. Shortness of breath. Chest pain.  EXAM: CHEST  2 VIEW  COMPARISON:  10/30/2014  FINDINGS: The heart size and mediastinal contours are within normal limits.  Both lungs are clear. The visualized skeletal structures are unremarkable.  IMPRESSION: No active cardiopulmonary disease.   Electronically Signed   By: Lucienne Capers M.D.   On: 02/28/2015 23:43   Mr Lumbar Spine Wo Contrast  02/28/2015   CLINICAL DATA:  Initial valuation for lower back pain, leg pain with bilateral leg weakness.  EXAM: MRI LUMBAR SPINE WITHOUT CONTRAST  TECHNIQUE: Multiplanar, multisequence MR imaging of the lumbar spine was performed. No intravenous contrast was administered.  COMPARISON:  Previous study from 01/24/2008.  FINDINGS: For the purposes of this dictation, the lowest well-formed intervertebral disc spaces presumed to be the L5-S1 level, and there presumed to be 5 lumbar type vertebral bodies.  Vertebral bodies are normally aligned with preservation of the normal lumbar lordosis. Vertebral body heights are preserved. No fracture or listhesis. No marrow edema. Single benign hemangioma noted within the posterior superior aspect of L2. No other focal osseous lesion.  Conus medullaris terminates normally at the L1 level. Signal intensity within the  visualized cord is normal. Nerve roots of the cauda equina are unremarkable.  Paraspinous soft tissues within normal limits. Multiple cystic T2 hyperintense lesions noted within the left ovary, which to the reflects multiple ovarian cyst or possibly complex cystic ovarian lesion. This measures approximately 5.2 x 3.7 x 4.1 cm, indeterminate. Visualized soft tissues otherwise unremarkable.  At T10-11 through L3-4, there is no disc bulge or disc protrusion. No significant facet disease. No canal or foraminal stenosis.  L4-5: Mild diffuse annular disc bulge without focal disc herniation. Mild facet and ligamentous hypertrophy. No significant stenosis.  L5-S1: Mild diffuse annular disc bulge without focal disc herniation. No canal or foraminal stenosis.  IMPRESSION: 1. Minimal degenerative annular disc bulge at L4-5 and L5-S1 without significant stenosis. 2. Mild bilateral facet arthrosis at L4-5. 3. Otherwise normal MRI of the lumbar spine. No significant canal or foraminal stenosis. No neural impingement. 4. Complex cystic left ovarian lesion, incompletely evaluated on this exam. Further evaluation with dedicated pelvic ultrasound could be performed for further assessment as clinically desired.   Electronically Signed   By: Jeannine Boga M.D.   On: 02/28/2015 23:14   I have personally reviewed and evaluated these images and lab results as part of my medical decision-making.   EKG Interpretation   Date/Time:  Wednesday February 28 2015 20:34:50 EDT Ventricular Rate:  63 PR Interval:  112 QRS Duration: 93 QT Interval:  388 QTC Calculation: 397 R Axis:   57 Text Interpretation:  Sinus rhythm Borderline short PR interval Low  voltage, precordial leads No significant change since last tracing  Confirmed by YAO  MD, DAVID ([id number]) on 02/28/2015 8:45:17 PM      MDM  Ms. Gamm is a 45 yo female w/ PMHx of external hemorrhoids, proctalgia fugax, endometriosis, & chronic lower back pain presenting  with back pain & leg weakness. SOB onset x3 days ago. Used inhaler at home with minimal relief. Acute on chronic lower back pain described as tightness last ngiht but no new inciting event or injury. Bilateral LE weakness onset today worse on L. Denies vision changes, slurred speech, facial droop, urinary retention, urinary/bowel incontinence, or saddle anesthesia. Has experienced 2 similar episodes of lower extremity weakness over the past several years. Meningitis 1 year ago with presentation of severe occipital headache, nausea, vomiting, and high fever.  Middle aged female lying in the stretcher in NAD. Afebrile. Not tachycardic. Normotensive. Breathing well on RA and maintaining saturations without supplemental oxygen. Lungs CTAB. Abd  benign. CV RRR. Neuro exam notable for 4+/5 RLE & 4-/5 LLE but otherwise non-focal  UA negative for blood or severe dehydration and negative for nitrite or leukocyte but with rare bacteria and few epithelial cells - culture sent, no symptoms similar needed to treat. WBC 12.3. Patient with neck pain but has full range of motion without evidence of meningismus. Afebrile here. Lower suspicion for meningitis. Hemoglobin 13.5. TSH within normal limits. Chest x-ray showing acute cardiac pulmonary disease. Given acuity of symptoms and concern for spinal cord pathology as well as acute on chronic lower back pain, al umbar spine without contrast was ordered and showed hemangioma at the posterior superior aspect of L2 but no acute processes including no spinal impingement. Laboratory and imaging results were personally reviewed by myself and used in the medical decision making of this patient's treatment and disposition.  Neurology consult and informed about the condition of the patient and evaluated the patient in the ED with recommendations to admit for MRI brain and complete MRI spine with without contrast to further evaluate for possible multiple sclerosis flare.  She admitted  to hospitalist service for evaluation and management of above. Pt understands and agrees with the plan and has no further questions or concerns.   Pt care discussed with and followed by my attending, Dr. Shirlyn Goltz  Mayer Camel, MD Pager [phone]   Final diagnoses:  Lower extremity weakness    Mayer Camel, MD 03/01/15 [id number]  Wandra Arthurs, MD 03/01/15 1126

## 2015-02-28 NOTE — ED Notes (Signed)
Dr. Claretta Fraise at bedside.

## 2015-02-28 NOTE — ED Notes (Signed)
Pt reports she has been having trouble breathing and SOB for 2 days, worse when she lays down. Also reports left leg pain, headache and burning in her neck. Was sent by PCP.

## 2015-02-28 NOTE — Patient Instructions (Addendum)
With you popliteal pain, dyspnea, head pressure sensation and  acute bilateral leg weakness(extreme difficulty ambulating). I recommend evaluation at [facility] ED. I think this may be better option if extensive imaging studies or specialist consult is needed. I recommend you give this summary to Triage staff. Follow up here as needed.  Note I had planned to out patient stat lower ext dopplers, considered d-dimer as well. Was going to try Ct of head as outpt after getting prior auth but after seeing her acute onset abnormal gate recommended ED eval.  Follow up here post ED evaluation

## 2015-02-28 NOTE — Progress Notes (Signed)
Pre visit review using our clinic review tool, if applicable. No additional management support is needed unless otherwise documented below in the visit note. 

## 2015-03-01 ENCOUNTER — Other Ambulatory Visit: Payer: Self-pay

## 2015-03-01 ENCOUNTER — Inpatient Hospital Stay (HOSPITAL_COMMUNITY): Payer: BLUE CROSS/BLUE SHIELD

## 2015-03-01 ENCOUNTER — Encounter (HOSPITAL_COMMUNITY): Payer: Self-pay

## 2015-03-01 DIAGNOSIS — G43909 Migraine, unspecified, not intractable, without status migrainosus: Secondary | ICD-10-CM | POA: Diagnosis present

## 2015-03-01 DIAGNOSIS — Z888 Allergy status to other drugs, medicaments and biological substances status: Secondary | ICD-10-CM | POA: Diagnosis not present

## 2015-03-01 DIAGNOSIS — K594 Anal spasm: Secondary | ICD-10-CM | POA: Diagnosis present

## 2015-03-01 DIAGNOSIS — Z6835 Body mass index (BMI) 35.0-35.9, adult: Secondary | ICD-10-CM | POA: Diagnosis not present

## 2015-03-01 DIAGNOSIS — R300 Dysuria: Secondary | ICD-10-CM | POA: Diagnosis present

## 2015-03-01 DIAGNOSIS — G8929 Other chronic pain: Secondary | ICD-10-CM | POA: Diagnosis present

## 2015-03-01 DIAGNOSIS — M79609 Pain in unspecified limb: Secondary | ICD-10-CM

## 2015-03-01 DIAGNOSIS — N8320 Unspecified ovarian cysts: Secondary | ICD-10-CM | POA: Diagnosis present

## 2015-03-01 DIAGNOSIS — R29898 Other symptoms and signs involving the musculoskeletal system: Secondary | ICD-10-CM | POA: Diagnosis not present

## 2015-03-01 DIAGNOSIS — R079 Chest pain, unspecified: Secondary | ICD-10-CM | POA: Diagnosis present

## 2015-03-01 DIAGNOSIS — M545 Low back pain: Secondary | ICD-10-CM | POA: Diagnosis present

## 2015-03-01 DIAGNOSIS — D649 Anemia, unspecified: Secondary | ICD-10-CM | POA: Diagnosis present

## 2015-03-01 DIAGNOSIS — R531 Weakness: Secondary | ICD-10-CM | POA: Diagnosis present

## 2015-03-01 DIAGNOSIS — Z90710 Acquired absence of both cervix and uterus: Secondary | ICD-10-CM | POA: Diagnosis not present

## 2015-03-01 DIAGNOSIS — Z885 Allergy status to narcotic agent status: Secondary | ICD-10-CM | POA: Diagnosis not present

## 2015-03-01 DIAGNOSIS — K219 Gastro-esophageal reflux disease without esophagitis: Secondary | ICD-10-CM

## 2015-03-01 DIAGNOSIS — D72829 Elevated white blood cell count, unspecified: Secondary | ICD-10-CM | POA: Diagnosis present

## 2015-03-01 DIAGNOSIS — R0602 Shortness of breath: Secondary | ICD-10-CM | POA: Diagnosis present

## 2015-03-01 DIAGNOSIS — L409 Psoriasis, unspecified: Secondary | ICD-10-CM | POA: Diagnosis present

## 2015-03-01 DIAGNOSIS — G4733 Obstructive sleep apnea (adult) (pediatric): Secondary | ICD-10-CM | POA: Diagnosis present

## 2015-03-01 DIAGNOSIS — E538 Deficiency of other specified B group vitamins: Secondary | ICD-10-CM

## 2015-03-01 DIAGNOSIS — Z79899 Other long term (current) drug therapy: Secondary | ICD-10-CM | POA: Diagnosis not present

## 2015-03-01 DIAGNOSIS — M5136 Other intervertebral disc degeneration, lumbar region: Secondary | ICD-10-CM | POA: Diagnosis present

## 2015-03-01 DIAGNOSIS — M62838 Other muscle spasm: Secondary | ICD-10-CM | POA: Diagnosis present

## 2015-03-01 DIAGNOSIS — J302 Other seasonal allergic rhinitis: Secondary | ICD-10-CM | POA: Diagnosis present

## 2015-03-01 DIAGNOSIS — Z881 Allergy status to other antibiotic agents status: Secondary | ICD-10-CM | POA: Diagnosis not present

## 2015-03-01 DIAGNOSIS — K589 Irritable bowel syndrome without diarrhea: Secondary | ICD-10-CM | POA: Diagnosis present

## 2015-03-01 DIAGNOSIS — M4696 Unspecified inflammatory spondylopathy, lumbar region: Secondary | ICD-10-CM | POA: Diagnosis present

## 2015-03-01 DIAGNOSIS — E669 Obesity, unspecified: Secondary | ICD-10-CM | POA: Diagnosis present

## 2015-03-01 DIAGNOSIS — Z8661 Personal history of infections of the central nervous system: Secondary | ICD-10-CM | POA: Diagnosis not present

## 2015-03-01 LAB — RAPID URINE DRUG SCREEN, HOSP PERFORMED
AMPHETAMINES: NOT DETECTED
BENZODIAZEPINES: NOT DETECTED
Barbiturates: NOT DETECTED
COCAINE: NOT DETECTED
Opiates: POSITIVE — AB
TETRAHYDROCANNABINOL: NOT DETECTED

## 2015-03-01 LAB — BASIC METABOLIC PANEL
ANION GAP: 6 (ref 5–15)
BUN: 7 mg/dL (ref 6–20)
CHLORIDE: 103 mmol/L (ref 101–111)
CO2: 26 mmol/L (ref 22–32)
Calcium: 8.7 mg/dL — ABNORMAL LOW (ref 8.9–10.3)
Creatinine, Ser: 0.9 mg/dL (ref 0.44–1.00)
GFR calc non Af Amer: >60 mL/min (ref >60)
Glucose, Bld: 91 mg/dL (ref 65–99)
Potassium: 3.9 mmol/L (ref 3.5–5.1)
Sodium: 135 mmol/L (ref 135–145)

## 2015-03-01 LAB — URINE MICROSCOPIC-ADD ON

## 2015-03-01 LAB — URINALYSIS, ROUTINE W REFLEX MICROSCOPIC
Bilirubin Urine: NEGATIVE
Glucose, UA: NEGATIVE mg/dL
KETONES UR: NEGATIVE mg/dL
LEUKOCYTES UA: NEGATIVE
Nitrite: NEGATIVE
PH: 5.5 (ref 5.0–8.0)
PROTEIN: NEGATIVE mg/dL
Specific Gravity, Urine: 1.009 (ref 1.005–1.030)
Urobilinogen, UA: 0.2 mg/dL (ref 0.0–1.0)

## 2015-03-01 LAB — VITAMIN B12: Vitamin B-12: 236 pg/mL (ref 180–914)

## 2015-03-01 LAB — GLUCOSE, CAPILLARY: GLUCOSE-CAPILLARY: 87 mg/dL (ref 65–99)

## 2015-03-01 LAB — CBC
HEMATOCRIT: 41.2 % (ref 36.0–46.0)
HEMOGLOBIN: 13.2 g/dL (ref 12.0–15.0)
MCH: 27.7 pg (ref 26.0–34.0)
MCHC: 32 g/dL (ref 30.0–36.0)
MCV: 86.4 fL (ref 78.0–100.0)
Platelets: 384 10*3/uL (ref 150–400)
RBC: 4.77 MIL/uL (ref 3.87–5.11)
RDW: 13.9 % (ref 11.5–15.5)
WBC: 10.9 10*3/uL — ABNORMAL HIGH (ref 4.0–10.5)

## 2015-03-01 LAB — TROPONIN I

## 2015-03-01 LAB — HIV ANTIBODY (ROUTINE TESTING W REFLEX): HIV Screen 4th Generation wRfx: NONREACTIVE

## 2015-03-01 LAB — C-REACTIVE PROTEIN: CRP: 0.9 mg/dL (ref <1.0)

## 2015-03-01 LAB — PROTIME-INR
INR: 1.17 (ref 0.00–1.49)
Prothrombin Time: 15.1 seconds (ref 11.6–15.2)

## 2015-03-01 LAB — APTT: APTT: 35 s (ref 24–37)

## 2015-03-01 LAB — SEDIMENTATION RATE: SED RATE: 23 mm/h — AB (ref 0–22)

## 2015-03-01 MED ORDER — PANTOPRAZOLE SODIUM 20 MG PO TBEC
20.0000 mg | DELAYED_RELEASE_TABLET | Freq: Every day | ORAL | Status: DC
  Administered 2015-03-01: 20 mg via ORAL
  Filled 2015-03-01: qty 1

## 2015-03-01 MED ORDER — ONDANSETRON HCL 4 MG PO TABS: 4.0000 mg | ORAL_TABLET | Freq: Four times a day (QID) | ORAL | Status: DC | PRN

## 2015-03-01 MED ORDER — LORATADINE 10 MG PO TABS
10.0000 mg | ORAL_TABLET | Freq: Every day | ORAL | Status: DC
  Administered 2015-03-01: 10 mg via ORAL
  Filled 2015-03-01: qty 1

## 2015-03-01 MED ORDER — ACETAMINOPHEN 500 MG PO TABS
500.0000 mg | ORAL_TABLET | Freq: Four times a day (QID) | ORAL | Status: DC | PRN
  Administered 2015-03-01: 500 mg via ORAL
  Filled 2015-03-01: qty 1

## 2015-03-01 MED ORDER — CYCLOBENZAPRINE HCL 10 MG PO TABS: 10.0000 mg | ORAL_TABLET | Freq: Every day | ORAL | Status: DC

## 2015-03-01 MED ORDER — ALBUTEROL SULFATE (2.5 MG/3ML) 0.083% IN NEBU: 2.5000 mg | INHALATION_SOLUTION | RESPIRATORY_TRACT | Status: DC | PRN

## 2015-03-01 MED ORDER — DICLOFENAC SODIUM 75 MG PO TBEC
75.0000 mg | DELAYED_RELEASE_TABLET | Freq: Two times a day (BID) | ORAL | Status: DC
  Administered 2015-03-01: 75 mg via ORAL
  Filled 2015-03-01 (×2): qty 1

## 2015-03-01 MED ORDER — SODIUM CHLORIDE 0.9 % IV SOLN
INTRAVENOUS | Status: DC
  Administered 2015-03-01: 02:00:00 via INTRAVENOUS

## 2015-03-01 MED ORDER — VITAMIN B-12 100 MCG PO TABS: 100.0000 ug | ORAL_TABLET | Freq: Every day | ORAL | Status: DC

## 2015-03-01 MED ORDER — FLUTICASONE PROPIONATE 50 MCG/ACT NA SUSP
1.0000 | Freq: Every day | NASAL | Status: DC
  Administered 2015-03-01: 1 via NASAL
  Filled 2015-03-01: qty 16

## 2015-03-01 MED ORDER — HEPARIN SODIUM (PORCINE) 5000 UNIT/ML IJ SOLN
5000.0000 [IU] | Freq: Three times a day (TID) | INTRAMUSCULAR | Status: DC
  Administered 2015-03-01 (×2): 5000 [IU] via SUBCUTANEOUS
  Filled 2015-03-01: qty 1

## 2015-03-01 MED ORDER — SODIUM CHLORIDE 0.9 % IJ SOLN: 3.0000 mL | Freq: Two times a day (BID) | INTRAMUSCULAR | Status: DC

## 2015-03-01 MED ORDER — ONDANSETRON HCL 4 MG/2ML IJ SOLN: 4.0000 mg | Freq: Four times a day (QID) | INTRAMUSCULAR | Status: DC | PRN

## 2015-03-01 MED ORDER — IOHEXOL 350 MG/ML SOLN
100.0000 mL | Freq: Once | INTRAVENOUS | Status: AC | PRN
  Administered 2015-03-01: 80 mL via INTRAVENOUS

## 2015-03-01 MED ORDER — GADOBENATE DIMEGLUMINE 529 MG/ML IV SOLN
20.0000 mL | Freq: Once | INTRAVENOUS | Status: AC | PRN
  Administered 2015-03-01: 20 mL via INTRAVENOUS

## 2015-03-01 MED ORDER — MORPHINE SULFATE (PF) 2 MG/ML IV SOLN: 2.0000 mg | INTRAVENOUS | Status: DC | PRN

## 2015-03-01 NOTE — Evaluation (Signed)
Occupational Therapy Evaluation Patient Details Name: Lorraine Conrad MRN: [id number] DOB: [birth year] Today's Date: 03/01/2015    History of Present Illness Pt is a 44 yo female admitted with B leg weakness, back pain (long standing), and chest pain with SOB.  Pt has had 3 episodes of this leg weaknes in her lifetime and all have had - workups.  Pt has -MS workup.  Troponin -, MRI shows L4-5, L5-S1 minimal disk bulge which is not new.  Dopplers for LE pending although MD in rounds instructed therapy to progress anyway.   Clinical Impression   Pt admitted with the above diagnosis and has the deficits listed below. Pt would benefit from one or two more sessions of OT to ensure she is safe and independent with all of her LE adls and adl transfers since she is currently very anxious on her feet. Pt should be safe to d/c home with some Supervision.  Pt is aware of her deficits and compensates well therefore should be safe at home.    Follow Up Recommendations  No OT follow up;Supervision - Intermittent    Equipment Recommendations  None recommended by OT    Recommendations for Other Services       Precautions / Restrictions Precautions Precautions: Fall Precaution Comments: Pt anxious when on her feet.  States she is SOB but this is not noticed. Restrictions Weight Bearing Restrictions: No      Mobility Bed Mobility Overal bed mobility: Modified Independent             General bed mobility comments: pt used bedrails.  Transfers Overall transfer level: Needs assistance Equipment used: 1 person hand held assist Transfers: Sit to/from [facility] Sit to Stand: Supervision Stand pivot transfers: Supervision       General transfer comment: safe on feet for short distances.  Did not walk long distances with pt.    Balance Overall balance assessment: Needs assistance Sitting-balance support: Feet supported Sitting balance-Leahy Scale: Good     Standing  balance support: Bilateral upper extremity supported;During functional activity Standing balance-Leahy Scale: Good Standing balance comment: Pt could stand up to do adls w/o outside support for short periods of time.                            ADL Overall ADL's : Needs assistance/impaired Eating/Feeding: Independent;Sitting   Grooming: Standing;Supervision/safety   Upper Body Bathing: Set up;Sitting   Lower Body Bathing: Supervison/ safety;Sit to/from stand   Upper Body Dressing : Set up;Sitting   Lower Body Dressing: Supervision/safety;Sit to/from stand;Cueing for back precautions Lower Body Dressing Details (indicate cue type and reason): instucted pt in back precautions although she does not formally have them.  Pt with disk bulges and long standing back pain. Toilet Transfer: Supervision/safety;BSC;Stand-pivot Armed forces technical officer Details (indicate cue type and reason): Pt anxious on her feet during adls but was safe. Toileting- Clothing Manipulation and Hygiene: Supervision/safety;Sit to/from stand       Functional mobility during ADLs: Min guard General ADL Comments: Pt was slow with adls although only required Supervision.  Pt anxious when on her feet but was safe on her feet during adls.     Vision Vision Assessment?: No apparent visual deficits   Perception     Praxis      Pertinent Vitals/Pain Pain Assessment: 0-10 Pain Score: 6  Pain Location: B legs (L worse than R) and back. Pain Descriptors / Indicators: Burning;Aching;Pins and needles;Shooting;Tender Pain Intervention(s):  Limited activity within patient's tolerance;Monitored during session;Repositioned;Patient requesting pain meds-RN notified     Hand Dominance Right   Extremity/Trunk Assessment Upper Extremity Assessment Upper Extremity Assessment: Overall WFL for tasks assessed   Lower Extremity Assessment Lower Extremity Assessment: Defer to PT evaluation   Cervical / Trunk  Assessment Cervical / Trunk Assessment: Normal   Communication Communication Communication: No difficulties   Cognition Arousal/Alertness: Awake/alert Behavior During Therapy: WFL for tasks assessed/performed Overall Cognitive Status: Within Functional Limits for tasks assessed                     General Comments       Exercises       Shoulder Instructions      Home Living Family/patient expects to be discharged to:: Private residence Living Arrangements: Spouse/significant other;Children (2 kids 10 and 13) Available Help at Discharge: Family Type of Home: House Home Access: Stairs to enter [employer] of Steps: 4 Entrance Stairs-Rails: None Home Layout: Multi-level;Other (Comment) (split level.  up 3 steps.  does not have to go down) Alternate Level Stairs-Number of Steps: 3 Alternate Level Stairs-Rails: Right Bathroom Shower/Tub: Tub/shower unit;Curtain Shower/tub characteristics: Architectural technologist: Standard     Home Equipment: None   Additional Comments: Pt stated she would not be able to use a walker in her house that it is too small.      Prior Functioning/Environment Level of Independence: Independent        Comments: Prior to monday of this week, pt was I with all adls and driving.    OT Diagnosis: Generalized weakness;Acute pain   OT Problem List: Decreased activity tolerance;Pain;Impaired balance (sitting and/or standing)   OT Treatment/Interventions: Self-care/ADL training;Therapeutic activities    OT Goals(Current goals can be found in the care plan section) Acute Rehab OT Goals Patient Stated Goal: to go home since it is her daughters bday today OT Goal Formulation: With patient Time For Goal Achievement: 03/08/15 Potential to Achieve Goals: Good ADL Goals Pt Will Perform Tub/Shower Transfer: Independently;Tub transfer Additional ADL Goal #1: Pt will walk to bathroom w/o assistive device and toilet Ily Additional ADL  Goal #2: Pt will gather own clothes in room and get dressed Ily.  OT Frequency: Min 2X/week   Barriers to D/C:            Co-evaluation              End of Session Nurse Communication: Mobility status  Activity Tolerance: Patient limited by fatigue Patient left: in chair;with call bell/phone within reach   Time: 0945-1010 OT Time Calculation (min): 25 min Charges:  OT General Charges $OT Visit: 1 Procedure OT Evaluation $Initial OT Evaluation Tier I: 1 Procedure OT Treatments $Self Care/Home Management : 8-22 mins G-Codes:    Glenford Peers 2015/03/30, 10:25 AM  [phone]

## 2015-03-01 NOTE — Progress Notes (Signed)
Subjective: Patient reports continued subjedctive weakness in her lower extremities.  No complaints of paresthesias.  Reports that she has been short of breath.  Objective: Current vital signs: BP 115/78 mmHg  Pulse 73  Temp(Src) 98.3 F (36.8 C) (Oral)  Resp 18  Ht 5' 5" (1.651 m)  Wt 96 kg (211 lb 10.3 oz)  BMI 35.22 kg/m2  SpO2 99%  LMP 02/08/2013 Vital signs in last 24 hours: Temp:  [98.1 F (36.7 C)-98.3 F (36.8 C)] 98.3 F (36.8 C) (09/22 0447) Pulse Rate:  [66-87] 73 (09/22 0447) Resp:  [15-24] 18 (09/22 0447) BP: (115-158)/(65-95) 115/78 mmHg (09/22 0447) SpO2:  [97 %-100 %] 99 % (09/22 0447) Weight:  [95.618 kg (210 lb 12.8 oz)-96 kg (211 lb 10.3 oz)] 96 kg (211 lb 10.3 oz) (09/22 0443)  Intake/Output from previous day:   Intake/Output this shift: Total I/O In: 0  Out: 450 [Urine:450] Nutritional status: Diet NPO time specified Except for: Sips with Meds  Neurologic Exam: Mental Status: Alert, oriented, thought content appropriate. Speech fluent without evidence of aphasia. Able to follow 3 step commands without difficulty. Cranial Nerves: II: Discs flat bilaterally; Visual fields grossly normal, pupils equal, round, reactive to light and accommodation III,IV, VI: ptosis not present, extra-ocular motions intact bilaterally V,VII: smile symmetric, facial light touch sensation normal bilaterally VIII: hearing normal bilaterally IX,X: uvula rises symmetrically XI: bilateral shoulder shrug XII: midline tongue extension without atrophy or fasciculations Motor: 5/5 throughout Sensory: Pinprick and light touch intact throughout, bilaterally Deep Tendon Reflexes:  2+ throughout Plantars: Right: downgoingLeft: downgoing Cerebellar: Normal heel-to-shin testing bilaterally  Lab Results: Basic Metabolic Panel:  Recent Labs Lab 02/28/15 2030 03/01/15 0715  NA 139 135  K 4.1 3.9  CL 105 103  CO2 27 26  GLUCOSE 93 91   BUN 7 7  CREATININE 0.96 0.90  CALCIUM 9.6 8.7*    Liver Function Tests:  Recent Labs Lab 02/28/15 2030  AST 15  ALT 14  ALKPHOS 84  BILITOT 0.3  PROT 7.1  ALBUMIN 3.7   No results for input(s): LIPASE, AMYLASE in the last 168 hours. No results for input(s): AMMONIA in the last 168 hours.  CBC:  Recent Labs Lab 02/28/15 2030 03/01/15 0715  WBC 12.3* 10.9*  NEUTROABS 8.7*  --   HGB 13.5 13.2  HCT 41.0 41.2  MCV 86.3 86.4  PLT 430* 384    Cardiac Enzymes:  Recent Labs Lab 03/01/15 0715  TROPONINI <0.03    Lipid Panel: No results for input(s): CHOL, TRIG, HDL, CHOLHDL, VLDL, LDLCALC in the last 168 hours.  CBG:  Recent Labs Lab 02/28/15 2027 03/01/15 0836  GLUCAP 88 87    Microbiology: Results for orders placed or performed during the hospital encounter of 02/28/15  Culture, blood (routine x 2)     Status: None (Preliminary result)   Collection Time: 03/01/15  7:11 AM  Result Value Ref Range Status   Specimen Description BLOOD LEFT HAND  Final   Special Requests BOTTLES DRAWN AEROBIC AND ANAEROBIC 5CC  Final   Culture PENDING  Incomplete   Report Status PENDING  Incomplete  Culture, blood (routine x 2)     Status: None (Preliminary result)   Collection Time: 03/01/15  7:15 AM  Result Value Ref Range Status   Specimen Description BLOOD RIGHT ARM  Final   Special Requests BOTTLES DRAWN AEROBIC AND ANAEROBIC 10CC  Final   Culture PENDING  Incomplete   Report Status PENDING  Incomplete  Coagulation Studies:  Recent Labs  03/01/15 0715  LABPROT 15.1  INR 1.17    Imaging: Dg Chest 2 View  02/28/2015   CLINICAL DATA:  Difficulty breathing and catching breath since Monday morning. Shortness of breath. Chest pain.  EXAM: CHEST  2 VIEW  COMPARISON:  10/30/2014  FINDINGS: The heart size and mediastinal contours are within normal limits. Both lungs are clear. The visualized skeletal structures are unremarkable.  IMPRESSION: No active  cardiopulmonary disease.   Electronically Signed   By: Lucienne Capers M.D.   On: 02/28/2015 23:43   Ct Angio Chest Pe W/cm &/or Wo Cm  03/01/2015   CLINICAL DATA:  Shortness of breath when lying flat. Mild chest pain.  EXAM: CT ANGIOGRAPHY CHEST WITH CONTRAST  TECHNIQUE: Multidetector CT imaging of the chest was performed using the standard protocol during bolus administration of intravenous contrast. Multiplanar CT image reconstructions and MIPs were obtained to evaluate the vascular anatomy.  CONTRAST:  29m OMNIPAQUE IOHEXOL 350 MG/ML SOLN  COMPARISON:  11/09/2007  FINDINGS: Technically adequate study with good opacification of the central and segmental pulmonary arteries. No focal filling defects demonstrated. No evidence of significant pulmonary embolus.  Normal heart size. Normal caliber thoracic aorta. No evidence of aortic dissection. Great vessel origins are patent. Esophagus is decompressed. No significant lymphadenopathy in the chest.  Evaluation of lungs is limited due to respiratory motion artifact. Probable atelectasis in the lung bases. Airways are patent. No pleural effusions. No pneumothorax.  Included portions of the upper abdominal organs are grossly unremarkable. No destructive bone lesions.  Review of the MIP images confirms the above findings.  IMPRESSION: No evidence of significant pulmonary embolus. Atelectasis in the lung bases.   Electronically Signed   By: WLucienne CapersM.D.   On: 03/01/2015 04:34   Mr BJeri CosWTMContrast  03/01/2015   CLINICAL DATA:  Initial valuation for lower extremity weakness, left greater than right.  EXAM: MRI HEAD WITHOUT AND WITH CONTRAST  TECHNIQUE: Multiplanar, multiecho pulse sequences of the brain and surrounding structures were obtained without and with intravenous contrast.  CONTRAST:  215mMULTIHANCE GADOBENATE DIMEGLUMINE 529 MG/ML IV SOLN  COMPARISON:  Prior study from 03/02/2014.  FINDINGS: The CSF containing spaces are within normal limits  for patient age. No focal parenchymal signal abnormality is identified. No mass lesion, midline shift, or extra-axial fluid collection. Ventricles are normal in size without evidence of hydrocephalus.  No diffusion-weighted signal abnormality is identified to suggest acute intracranial infarct. Gray-white matter differentiation is maintained. Normal flow voids are seen within the intracranial vasculature. No intracranial hemorrhage identified.  The cervicomedullary junction is normal. Pituitary gland is within normal limits. Pituitary stalk is midline. The globes and optic nerves demonstrate a normal appearance with normal signal intensity. No abnormal enhancement.  The bone marrow signal intensity is normal. Calvarium is intact. Visualized upper cervical spine is within normal limits.  Scalp soft tissues are unremarkable.  Small retention cyst within the right maxillary sinus. Paranasal sinuses are otherwise clear. No mastoid effusion.  IMPRESSION: Normal brain MRI.   Electronically Signed   By: BeJeannine Boga.D.   On: 03/01/2015 04:55   Mr Lumbar Spine Wo Contrast  02/28/2015   CLINICAL DATA:  Initial valuation for lower back pain, leg pain with bilateral leg weakness.  EXAM: MRI LUMBAR SPINE WITHOUT CONTRAST  TECHNIQUE: Multiplanar, multisequence MR imaging of the lumbar spine was performed. No intravenous contrast was administered.  COMPARISON:  Previous study from 01/24/2008.  FINDINGS:  For the purposes of this dictation, the lowest well-formed intervertebral disc spaces presumed to be the L5-S1 level, and there presumed to be 5 lumbar type vertebral bodies.  Vertebral bodies are normally aligned with preservation of the normal lumbar lordosis. Vertebral body heights are preserved. No fracture or listhesis. No marrow edema. Single benign hemangioma noted within the posterior superior aspect of L2. No other focal osseous lesion.  Conus medullaris terminates normally at the L1 level. Signal intensity  within the visualized cord is normal. Nerve roots of the cauda equina are unremarkable.  Paraspinous soft tissues within normal limits. Multiple cystic T2 hyperintense lesions noted within the left ovary, which to the reflects multiple ovarian cyst or possibly complex cystic ovarian lesion. This measures approximately 5.2 x 3.7 x 4.1 cm, indeterminate. Visualized soft tissues otherwise unremarkable.  At T10-11 through L3-4, there is no disc bulge or disc protrusion. No significant facet disease. No canal or foraminal stenosis.  L4-5: Mild diffuse annular disc bulge without focal disc herniation. Mild facet and ligamentous hypertrophy. No significant stenosis.  L5-S1: Mild diffuse annular disc bulge without focal disc herniation. No canal or foraminal stenosis.  IMPRESSION: 1. Minimal degenerative annular disc bulge at L4-5 and L5-S1 without significant stenosis. 2. Mild bilateral facet arthrosis at L4-5. 3. Otherwise normal MRI of the lumbar spine. No significant canal or foraminal stenosis. No neural impingement. 4. Complex cystic left ovarian lesion, incompletely evaluated on this exam. Further evaluation with dedicated pelvic ultrasound could be performed for further assessment as clinically desired.   Electronically Signed   By: Jeannine Boga M.D.   On: 02/28/2015 23:14   Mr Cervical Spine W Wo Contrast  03/01/2015   CLINICAL DATA:  Initial evaluation for bilateral lower extremity weakness, left greater than right.  EXAM: MRI CERVICAL SPINE WITHOUT AND WITH CONTRAST  TECHNIQUE: Multiplanar and multiecho pulse sequences of the cervical spine, to include the craniocervical junction and cervicothoracic junction, were obtained according to standard protocol without and with intravenous contrast.  CONTRAST:  44m MULTIHANCE GADOBENATE DIMEGLUMINE 529 MG/ML IV SOLN  COMPARISON:  None.  FINDINGS: Visualized portions of the brain and posterior fossa demonstrate a normal appearance with normal signal intensity.  Craniocervical junction widely patent.  Vertebral bodies are normally aligned with preservation of the normal cervical lordosis. Vertebral body heights are preserved. Signal intensity within the vertebral body bone marrow is normal. No abnormal enhancement.  Signal intensity within the cervical spinal cord is normal. No focal lesions to suggest underlying demyelinating disease.  Paraspinous soft tissues within normal limits.  C2-C3: Negative.  C3-C4: Broad-based degenerative disc bulge mildly flattens the ventral thecal sac without significant stenosis. Foramina remain widely patent.  C4-C5: Minimal bilateral uncovertebral hypertrophy without significant stenosis.  C5-C6:  Minimal annular disc bulge without significant stenosis.  C6-C7: Minimal annular disc bulge without significant stenosis. Mild left-sided facet arthrosis.  C7-T1:  Negative.  Visualized portions of the upper thoracic spine demonstrate no acute abnormality.  IMPRESSION: 1. Normal MRI appearance of the cervical spinal cord. No evidence for underlying demyelinating disease or other acute abnormality. 2. Minimal degenerative disc bulging at C3-4, C5-6, and C6-7 without significant stenosis.   Electronically Signed   By: BJeannine BogaM.D.   On: 03/01/2015 05:29   Mr Thoracic Spine W Wo Contrast  03/01/2015   CLINICAL DATA:  Initial evaluation for acute bilateral lower extremity weakness, left greater than right. Concern for possible demyelinating disease.  EXAM: MRI THORACIC SPINE WITHOUT AND WITH CONTRAST  TECHNIQUE:  Multiplanar and multiecho pulse sequences of the thoracic spine were obtained without and with intravenous contrast.  CONTRAST:  83m MULTIHANCE GADOBENATE DIMEGLUMINE 529 MG/ML IV SOLN  COMPARISON:  None available.  FINDINGS: The vertebral bodies are normally aligned with preservation of the normal thoracic kyphosis. Vertebral body heights are preserved. Signal intensity within the vertebral body bone marrow is normal. No  marrow edema. Small benign hemangioma noted within the T6 vertebral body. No other focal osseous lesion. No abnormal enhancement.  Signal intensity within the thoracic spinal cord is normal. Small T2 hyperintense signal intensity seen on axial T2 weighted sequence most consistent with artifact, as is seen coursing [place]. No focal lesions to suggest underlying demyelinating disease. No abnormal enhancement.  Paraspinous soft tissues within normal limits.  No significant degenerative changes within the thoracic spine. No canal or foraminal stenosis.  IMPRESSION: 1. Normal MRI appearance of the thoracic spinal cord. No findings to suggest underlying demyelinating disease. 2. No significant degenerative changes identified. No significant canal or foraminal stenosis.   Electronically Signed   By: BJeannine BogaM.D.   On: 03/01/2015 05:53    Medications:  I have reviewed the patient's current medications. Scheduled: . cyclobenzaprine  10 mg Oral QHS  . diclofenac  75 mg Oral BID  . fluticasone  1 spray Each Nare Daily  . heparin  5,000 Units Subcutaneous 3 times per day  . loratadine  10 mg Oral Daily  . pantoprazole  20 mg Oral Daily  . sodium chloride  3 mL Intravenous Q12H    Assessment/Plan: Patient reports continued lower extremity complaints.  Also reports some shortness of breath.  MRI reviewed of the brain, cervical spine and lumbar spines.  There is some evidence of bulging discs but no evidence of MS or cord abnormalities.  Will rule out other possibilities such as autoimmune abnormalities versus MG.  No evidence of GBS.  Recommendations: 1.  ESR, CRP, myasthenia panel, heavy metal screen, B12, B1 2.  PT    LOS: 0 days   LAlexis Goodell MD [facility] [phone]/22/2016  10:05 AM

## 2015-03-01 NOTE — Discharge Summary (Signed)
Physician Discharge Summary  Lorraine Conrad NLG:[id number] DOB: [birth year] DOA: 02/28/2015  PCP: Mackie Pai, PA-C  Admit date: 02/28/2015 Discharge date: 03/01/2015  Time spent: <30 minutes  Recommendations for Outpatient Follow-up:  1. dsicahrge home with outpt PCP folow up in 1 week. pls follow labs for heavy metal screen, B1, ACH receptor ab 2. Referral made to outpt neurology  Discharge Diagnoses:  Principal Problem:   Leg weakness, bilateral   Active Problems:   GERD   Chest pain   IBS (irritable bowel syndrome)   Obesity (BMI 30-39.9)   Back pain   SOB (shortness of breath)   OSA (obstructive sleep apnea)   Leukocytosis   B12 deficiency   Discharge Condition: fair  Diet recommendation: regular  Filed Weights   02/28/15 1732 03/01/15 0443  Weight: 95.709 kg (211 lb) 96 kg (211 lb 10.3 oz)    History of present illness:  44 y.o. female with PMH of OSA on CPAP, migraine headaches, lower back pain, proctalgia fugax, obesity, GERD, meningitis, who presents with bilateral leg weakness, back pain, chest pain and shortness of breath.  Patient reports that she has chronic lower back pain, which has been getting worse since yesterday. She also has a bilateral leg weakness. No urinary incontinence or loss control of bowel movements. She has burning sensation over posterior head and bilateral shoulders. She also has intermittent dysuria and burning on urination which has been going on for a long time after she had hysterectomy. No vision change or hearing loss. No slurred speech, facial droop or saddle anesthesia. She also reports having mild chest pain and shortness of breath. Her chest pain is located in the middle of her chest, radiating to left arm, intermittent. She has tenderness over bilateral calf areas. She states that she traveled to [place] on 9/11 by driving 4 hours and drove back after stained therefore 3 days. Of note, this is 3rd episode in life of bilateral LE  weakness of unclear etiology. Pt states that she had unrevealing MS evaluation by neurology few years ago. ANA and RF were negative on 05/08/14.  In ED, patient was found to have negative urinalysis, negative troponin, positive UDS for opiates, WBC 12.3, TSH normal. temperature normal, no tachycardia, electrolytes okay, negative chest x-ray for acute abnormalities. MRI of lumbar spine showed minimal degenerative annular disc bulge at L4-5 and L5-S1 without significant stenosis, mild bilateral facet arthrosis at L4-5, complex cystic left ovarian lesion, incompletely evaluated on this exam. Patient is admitted to inpatient for further eval and treatment. Neurology was consulted.    Hospital Course:  Bilateral leg weakness with low back pain Neuro exam appears unremarkable. Has good strength in bilateral extremities. MRI of the brain normal. MRI of the cervical and lumbar spine shows some degenerative disc changes but no spinal stenosis or abnormality to explain her symptoms. Symptoms unlikely for GBS . ESR mildly elevated. Normal CRP. Urine drug screen negative. HIV ab negative. Heavy metal screen, Ach receptor ab sent and should be followed as outpt. -  Seen by physical therapy and recommended outpatient follow-up. Pt did ambulate about 150 ft and PT recommended using a cane but she refused.  -she may continue to use diclofenac for pain . Will prescribe few days of  Flexeril  qhs for muscle spasms.  Pt feels better this afternoon and wants to go home. i spoke with neurology who agreed it is ok for her to leave. i have made an outpt referral to neurology and instructed pt to  call the office in 2 weeks.   Chest pain and shortness of breath Denies any chest discomfort. No further shortness of breath. CT angiogram negative for PE. Doppler LE negative for DVT. EKG unremarkable. Initial troponin negative. Strongly doubt any cardiac etiology. TSH normal. Pt was seen by cardiology in dec 2015 for palpitations  and ? CP symptoms and had a negative exercise stress test.  Low B12 level ( 236) Unlikely to cause her acute symptoms. Will prescribe supplements.  GERD Continue Protonix.  OSA Continue bedtime CPAP  Leukocytosis No clinical sign of infection. Improved in am lab    Diet: regular  Code Status: Full code Family Communication: None at bedside Disposition Plan: Home    Consultants:  Neurology  Procedures:  MRI brain, cervical, thoracic and lumbar spine  CT angio chest  Doppler LE  Antibiotics: None  Discharge Exam: Filed Vitals:   03/01/15 1508  BP: 127/73  Pulse: 81  Temp: 98.2 F (36.8 C)  Resp: 16     General: No acute distress  HEENT: No pallor, moist oral mucosa  Chest: Clear to auscultation bilaterally  CVS: Normal S1-S2, no murmurs  GI: Soft, nondistended, nontender, bowel sounds present  Musculoskeletal: Warm, no edema  CNS: Alert and oriented, normal strength in all extremities, no focal weakness, normal sensations  Discharge Instructions   Discharge Instructions    Ambulatory referral to Neurology    Complete by:  As directed   An appointment is requested in approximately 3 weeks          Current Discharge Medication List    START taking these medications   Details  vitamin B-12 (CYANOCOBALAMIN) 100 MCG tablet Take 1 tablet (100 mcg total) by mouth daily. Qty: 30 tablet, Refills: 3      CONTINUE these medications which have CHANGED   Details  cyclobenzaprine (FLEXERIL) 10 MG tablet Take 1 tablet (10 mg total) by mouth at bedtime. Qty: 5 tablet, Refills: 0      CONTINUE these medications which have NOT CHANGED   Details  acetaminophen (TYLENOL) 500 MG tablet Take 1 tablet (500 mg total) by mouth every 6 (six) hours as needed for mild pain or headache. For pain Qty: 30 tablet, Refills: 0    albuterol (PROVENTIL HFA;VENTOLIN HFA) 108 (90 BASE) MCG/ACT inhaler Inhale 2 puffs into the lungs every 6 (six) hours as needed for  wheezing or shortness of breath. Qty: 1 Inhaler, Refills: 0    diclofenac (VOLTAREN) 75 MG EC tablet Take 1 tablet (75 mg total) by mouth 2 (two) times daily. Qty: 30 tablet, Refills: 1    lansoprazole (PREVACID) 30 MG capsule Take 30 mg by mouth daily at 12 noon.    loratadine (CLARITIN) 10 MG tablet Take 1 tablet (10 mg total) by mouth daily. Qty: 30 tablet, Refills: 0    mometasone (NASONEX) 50 MCG/ACT nasal spray 2 sprays each nostril q day Qty: 17 g, Refills: 1      STOP taking these medications     doxycycline (VIBRA-TABS) 100 MG tablet        Allergies  Allergen Reactions  . Promethazine Hcl Other (See Comments)    Violent tremors   . Cephalexin Diarrhea and Itching  . Codeine Other (See Comments)    Was a child when she took this.  . Dilaudid [Hydromorphone] Nausea And Vomiting  . Azithromycin Rash    Head to toe rash   Follow-up Information    Follow up with [facility].  Specialty:  Rehabilitation   Why:  will call in next 72 hours to schedule follow up appointment. If you do not hear fro mthem, please call to schedule appointment, referral has been made.   Contact information:   [address] [place] [id number] [place] [address] [phone]      Follow up with Mackie Pai, PA-C. Schedule an appointment as soon as possible for a visit in 1 week.   Specialty:  Physician Assistant   Contact information:   [address] [place] [address] [phone]       Follow up with TAT, REBECCA, DO.   Specialty:  Neurology   Why:  referral sent. pls call offcie in 2 weeks to confirm appt   Contact information:   [place] [address] [phone]        The results of significant diagnostics from this hospitalization (including imaging, microbiology, ancillary and laboratory) are listed below for reference.    Significant Diagnostic  Studies: Dg Chest 2 View  02/28/2015   CLINICAL DATA:  Difficulty breathing and catching breath since Monday morning. Shortness of breath. Chest pain.  EXAM: CHEST  2 VIEW  COMPARISON:  10/30/2014  FINDINGS: The heart size and mediastinal contours are within normal limits. Both lungs are clear. The visualized skeletal structures are unremarkable.  IMPRESSION: No active cardiopulmonary disease.   Electronically Signed   By: Lucienne Capers M.D.   On: 02/28/2015 23:43   Ct Angio Chest Pe W/cm &/or Wo Cm  03/01/2015   CLINICAL DATA:  Shortness of breath when lying flat. Mild chest pain.  EXAM: CT ANGIOGRAPHY CHEST WITH CONTRAST  TECHNIQUE: Multidetector CT imaging of the chest was performed using the standard protocol during bolus administration of intravenous contrast. Multiplanar CT image reconstructions and MIPs were obtained to evaluate the vascular anatomy.  CONTRAST:  16m OMNIPAQUE IOHEXOL 350 MG/ML SOLN  COMPARISON:  11/09/2007  FINDINGS: Technically adequate study with good opacification of the central and segmental pulmonary arteries. No focal filling defects demonstrated. No evidence of significant pulmonary embolus.  Normal heart size. Normal caliber thoracic aorta. No evidence of aortic dissection. Great vessel origins are patent. Esophagus is decompressed. No significant lymphadenopathy in the chest.  Evaluation of lungs is limited due to respiratory motion artifact. Probable atelectasis in the lung bases. Airways are patent. No pleural effusions. No pneumothorax.  Included portions of the upper abdominal organs are grossly unremarkable. No destructive bone lesions.  Review of the MIP images confirms the above findings.  IMPRESSION: No evidence of significant pulmonary embolus. Atelectasis in the lung bases.   Electronically Signed   By: WLucienne CapersM.D.   On: 03/01/2015 04:34   Mr BJeri CosWCXContrast  03/01/2015   CLINICAL DATA:  Initial valuation for lower extremity weakness, left greater  than right.  EXAM: MRI HEAD WITHOUT AND WITH CONTRAST  TECHNIQUE: Multiplanar, multiecho pulse sequences of the brain and surrounding structures were obtained without and with intravenous contrast.  CONTRAST:  26mMULTIHANCE GADOBENATE DIMEGLUMINE 529 MG/ML IV SOLN  COMPARISON:  Prior study from 03/02/2014.  FINDINGS: The CSF containing spaces are within normal limits for patient age. No focal parenchymal signal abnormality is identified. No mass lesion, midline shift, or extra-axial fluid collection. Ventricles are normal in size without evidence of hydrocephalus.  No diffusion-weighted signal abnormality is identified to suggest acute intracranial infarct. Gray-white matter differentiation is maintained. Normal flow voids are seen within  the intracranial vasculature. No intracranial hemorrhage identified.  The cervicomedullary junction is normal. Pituitary gland is within normal limits. Pituitary stalk is midline. The globes and optic nerves demonstrate a normal appearance with normal signal intensity. No abnormal enhancement.  The bone marrow signal intensity is normal. Calvarium is intact. Visualized upper cervical spine is within normal limits.  Scalp soft tissues are unremarkable.  Small retention cyst within the right maxillary sinus. Paranasal sinuses are otherwise clear. No mastoid effusion.  IMPRESSION: Normal brain MRI.   Electronically Signed   By: Jeannine Boga M.D.   On: 03/01/2015 04:55   Mr Lumbar Spine Wo Contrast  02/28/2015   CLINICAL DATA:  Initial valuation for lower back pain, leg pain with bilateral leg weakness.  EXAM: MRI LUMBAR SPINE WITHOUT CONTRAST  TECHNIQUE: Multiplanar, multisequence MR imaging of the lumbar spine was performed. No intravenous contrast was administered.  COMPARISON:  Previous study from 01/24/2008.  FINDINGS: For the purposes of this dictation, the lowest well-formed intervertebral disc spaces presumed to be the L5-S1 level, and there presumed to be 5 lumbar  type vertebral bodies.  Vertebral bodies are normally aligned with preservation of the normal lumbar lordosis. Vertebral body heights are preserved. No fracture or listhesis. No marrow edema. Single benign hemangioma noted within the posterior superior aspect of L2. No other focal osseous lesion.  Conus medullaris terminates normally at the L1 level. Signal intensity within the visualized cord is normal. Nerve roots of the cauda equina are unremarkable.  Paraspinous soft tissues within normal limits. Multiple cystic T2 hyperintense lesions noted within the left ovary, which to the reflects multiple ovarian cyst or possibly complex cystic ovarian lesion. This measures approximately 5.2 x 3.7 x 4.1 cm, indeterminate. Visualized soft tissues otherwise unremarkable.  At T10-11 through L3-4, there is no disc bulge or disc protrusion. No significant facet disease. No canal or foraminal stenosis.  L4-5: Mild diffuse annular disc bulge without focal disc herniation. Mild facet and ligamentous hypertrophy. No significant stenosis.  L5-S1: Mild diffuse annular disc bulge without focal disc herniation. No canal or foraminal stenosis.  IMPRESSION: 1. Minimal degenerative annular disc bulge at L4-5 and L5-S1 without significant stenosis. 2. Mild bilateral facet arthrosis at L4-5. 3. Otherwise normal MRI of the lumbar spine. No significant canal or foraminal stenosis. No neural impingement. 4. Complex cystic left ovarian lesion, incompletely evaluated on this exam. Further evaluation with dedicated pelvic ultrasound could be performed for further assessment as clinically desired.   Electronically Signed   By: Jeannine Boga M.D.   On: 02/28/2015 23:14   Mr Cervical Spine W Wo Contrast  03/01/2015   CLINICAL DATA:  Initial evaluation for bilateral lower extremity weakness, left greater than right.  EXAM: MRI CERVICAL SPINE WITHOUT AND WITH CONTRAST  TECHNIQUE: Multiplanar and multiecho pulse sequences of the cervical  spine, to include the craniocervical junction and cervicothoracic junction, were obtained according to standard protocol without and with intravenous contrast.  CONTRAST:  65m MULTIHANCE GADOBENATE DIMEGLUMINE 529 MG/ML IV SOLN  COMPARISON:  None.  FINDINGS: Visualized portions of the brain and posterior fossa demonstrate a normal appearance with normal signal intensity. Craniocervical junction widely patent.  Vertebral bodies are normally aligned with preservation of the normal cervical lordosis. Vertebral body heights are preserved. Signal intensity within the vertebral body bone marrow is normal. No abnormal enhancement.  Signal intensity within the cervical spinal cord is normal. No focal lesions to suggest underlying demyelinating disease.  Paraspinous soft tissues within normal limits.  C2-C3:  Negative.  C3-C4: Broad-based degenerative disc bulge mildly flattens the ventral thecal sac without significant stenosis. Foramina remain widely patent.  C4-C5: Minimal bilateral uncovertebral hypertrophy without significant stenosis.  C5-C6:  Minimal annular disc bulge without significant stenosis.  C6-C7: Minimal annular disc bulge without significant stenosis. Mild left-sided facet arthrosis.  C7-T1:  Negative.  Visualized portions of the upper thoracic spine demonstrate no acute abnormality.  IMPRESSION: 1. Normal MRI appearance of the cervical spinal cord. No evidence for underlying demyelinating disease or other acute abnormality. 2. Minimal degenerative disc bulging at C3-4, C5-6, and C6-7 without significant stenosis.   Electronically Signed   By: Jeannine Boga M.D.   On: 03/01/2015 05:29   Mr Thoracic Spine W Wo Contrast  03/01/2015   CLINICAL DATA:  Initial evaluation for acute bilateral lower extremity weakness, left greater than right. Concern for possible demyelinating disease.  EXAM: MRI THORACIC SPINE WITHOUT AND WITH CONTRAST  TECHNIQUE: Multiplanar and multiecho pulse sequences of the  thoracic spine were obtained without and with intravenous contrast.  CONTRAST:  72m MULTIHANCE GADOBENATE DIMEGLUMINE 529 MG/ML IV SOLN  COMPARISON:  None available.  FINDINGS: The vertebral bodies are normally aligned with preservation of the normal thoracic kyphosis. Vertebral body heights are preserved. Signal intensity within the vertebral body bone marrow is normal. No marrow edema. Small benign hemangioma noted within the T6 vertebral body. No other focal osseous lesion. No abnormal enhancement.  Signal intensity within the thoracic spinal cord is normal. Small T2 hyperintense signal intensity seen on axial T2 weighted sequence most consistent with artifact, as is seen coursing in an AGallatinof the spinal cord. No focal lesions to suggest underlying demyelinating disease. No abnormal enhancement.  Paraspinous soft tissues within normal limits.  No significant degenerative changes within the thoracic spine. No canal or foraminal stenosis.  IMPRESSION: 1. Normal MRI appearance of the thoracic spinal cord. No findings to suggest underlying demyelinating disease. 2. No significant degenerative changes identified. No significant canal or foraminal stenosis.   Electronically Signed   By: BJeannine BogaM.D.   On: 03/01/2015 05:53    Microbiology: Recent Results (from the past 240 hour(s))  Culture, blood (routine x 2)     Status: None (Preliminary result)   Collection Time: 03/01/15  7:11 AM  Result Value Ref Range Status   Specimen Description BLOOD LEFT HAND  Final   Special Requests BOTTLES DRAWN AEROBIC AND ANAEROBIC 5CC  Final   Culture PENDING  Incomplete   Report Status PENDING  Incomplete  Culture, blood (routine x 2)     Status: None (Preliminary result)   Collection Time: 03/01/15  7:15 AM  Result Value Ref Range Status   Specimen Description BLOOD RIGHT ARM  Final   Special Requests BOTTLES DRAWN AEROBIC AND ANAEROBIC 10CC  Final   Culture PENDING  Incomplete   Report Status PENDING   Incomplete     Labs: Basic Metabolic Panel:  Recent Labs Lab 02/28/15 2030 03/01/15 0715  NA 139 135  K 4.1 3.9  CL 105 103  CO2 27 26  GLUCOSE 93 91  BUN 7 7  CREATININE 0.96 0.90  CALCIUM 9.6 8.7*   Liver Function Tests:  Recent Labs Lab 02/28/15 2030  AST 15  ALT 14  ALKPHOS 84  BILITOT 0.3  PROT 7.1  ALBUMIN 3.7   No results for input(s): LIPASE, AMYLASE in the last 168 hours. No results for input(s): AMMONIA in the last 168 hours. CBC:  Recent Labs Lab 02/28/15  2030 03/01/15 0715  WBC 12.3* 10.9*  NEUTROABS 8.7*  --   HGB 13.5 13.2  HCT 41.0 41.2  MCV 86.3 86.4  PLT 430* 384   Cardiac Enzymes:  Recent Labs Lab 03/01/15 0715  TROPONINI <0.03   BNP: BNP (last 3 results) No results for input(s): BNP in the last 8760 hours.  ProBNP (last 3 results) No results for input(s): PROBNP in the last 8760 hours.  CBG:  Recent Labs Lab 02/28/15 2027 03/01/15 0836  GLUCAP 88 87       Signed:  DHUNGEL, NISHANT  [facility] 03/01/2015, 5:14 PM

## 2015-03-01 NOTE — Progress Notes (Signed)
PT Cancellation Note  Patient Details Name: Lorraine Conrad MRN: [id number] DOB: May 11, 1971   Cancelled Treatment:    Reason Eval/Treat Not Completed:  (dopplers to r/u LE DVT are pending. Will follow. )   Philomena Doheny 03/01/2015, 9:39 AM [phone]

## 2015-03-01 NOTE — Care Management Note (Signed)
Case Management Note  Patient Details  Name: Lorraine Conrad MRN: [id number] Date of Birth: 12-15-1970  Subjective/Objective:           Date:9-22 Thursday Spoke with patient at the bedside along with family, husband and two children. Introduced self as Tourist information centre manager and explained role in discharge planning and how to be reached. Verified patient lives in [place] in 2 level house, with spouseand children. Verified patient anticipates to go home with family, at time of discharge and will have part-time supervision by family friends neighbors at this time to best of their knowledge. Patient has no DME. Expressed potential need for no other DME. Patient denied needing help with their medication. Patient drives to MD appointments. Verified patient has PCP Dr Janell Quiet. Marland Kitchen Patient states they currently receive [place] services through no one. .  Plan: CM will continue to follow for discharge planning and [facility] resources.   Carles Collet RN BSN CM [phone]          Action/Plan:   Expected Discharge Date:                  Expected Discharge Plan:  Home/Self Care  In-House Referral:     Discharge planning Services  CM Consult  Post Acute Care Choice:    Choice offered to:     DME Arranged:    DME Agency:     HH Arranged:    HH Agency:     Status of Service:  In process, will continue to follow  Medicare Important Message Given:    Date Medicare IM Given:    Medicare IM give by:    Date Additional Medicare IM Given:    Additional Medicare Important Message give by:     If discussed at [place] of Stay Meetings, dates discussed:    Additional Comments:  Carles Collet, RN 03/01/2015, 10:44 AM

## 2015-03-01 NOTE — Progress Notes (Signed)
[facility] PROGRESS NOTE  Lorraine Conrad WNU:[id number] DOB: [birth year] DOA: 02/28/2015 PCP: Mackie Pai, PA-C  Assessment/Plan: Bilateral leg weakness with low back pain Neuro exam appears unremarkable. Has good strength in bilateral extremities. MRI of the brain normal. MRI of the cervical and lumbar spine shows some degenerative disc changes but no spinal stenosis or abnormality to explain her symptoms. -Continue when necessary pain medications. Does unlikely for GBS. Neurology recommends workup including ESR, CRP, senna panel, B12 and heavy metal screen. -Seen by physical therapy and recommended outpatient follow-up.  Chest pain and shortness of breath Denies any chest discomfort. For the shortness of breath. CT angiogram negative for PE. EKG unremarkable. Initial plain negative. Strongly doubt any cardiac etiology. No lower extremity pending (patient complaining of bilateral calf tenderness and history of recent travel). TSH normal.  GERD Continue Protonix.  OSA Continue bedtime CPAP  Leukocytosis No clinical sign of infection. Improved in am lab  DVT prophylaxis: sq heparin  Diet: regular  Code Status: Full code Family Communication: None at bedside Disposition Plan: Home once clinically improved   Consultants:  Neurology  Procedures:  MRI brain, cervical, thoracic and lumbar spine  CT angio chest  Antibiotics: None   HPI/Subjective: Seen and examined. Complains of burning sensation in her neck, some headache and low back pain. admission H&P reviewed  Objective: Filed Vitals:   03/01/15 1157  BP:   Pulse: 107  Temp:   Resp:     Intake/Output Summary (Last 24 hours) at 03/01/15 1352 Last data filed at 03/01/15 0935  Gross per 24 hour  Intake      0 ml  Output    450 ml  Net   -450 ml   Filed Weights   02/28/15 1732 03/01/15 0443  Weight: 95.709 kg (211 lb) 96 kg (211 lb 10.3 oz)    Exam:   General:  No acute distress  HEENT: No  pallor, moist oral mucosa  Chest: Clear to auscultation bilaterally  CVS: Normal S1-S2, no murmurs  GI: Soft, nondistended, nontender, bowel sounds present  Musculoskeletal: Warm, no edema  CNS: Alert and oriented, normal strength in all extremities, no focal weakness, normal sensations    Data Reviewed: Basic Metabolic Panel:  Recent Labs Lab 02/28/15 2030 03/01/15 0715  NA 139 135  K 4.1 3.9  CL 105 103  CO2 27 26  GLUCOSE 93 91  BUN 7 7  CREATININE 0.96 0.90  CALCIUM 9.6 8.7*   Liver Function Tests:  Recent Labs Lab 02/28/15 2030  AST 15  ALT 14  ALKPHOS 84  BILITOT 0.3  PROT 7.1  ALBUMIN 3.7   No results for input(s): LIPASE, AMYLASE in the last 168 hours. No results for input(s): AMMONIA in the last 168 hours. CBC:  Recent Labs Lab 02/28/15 2030 03/01/15 0715  WBC 12.3* 10.9*  NEUTROABS 8.7*  --   HGB 13.5 13.2  HCT 41.0 41.2  MCV 86.3 86.4  PLT 430* 384   Cardiac Enzymes:  Recent Labs Lab 03/01/15 0715  TROPONINI <0.03   BNP (last 3 results) No results for input(s): BNP in the last 8760 hours.  ProBNP (last 3 results) No results for input(s): PROBNP in the last 8760 hours.  CBG:  Recent Labs Lab 02/28/15 2027 03/01/15 0836  GLUCAP 88 87    Recent Results (from the past 240 hour(s))  Culture, blood (routine x 2)     Status: None (Preliminary result)   Collection Time: 03/01/15  7:11 AM  Result  Value Ref Range Status   Specimen Description BLOOD LEFT HAND  Final   Special Requests BOTTLES DRAWN AEROBIC AND ANAEROBIC 5CC  Final   Culture PENDING  Incomplete   Report Status PENDING  Incomplete  Culture, blood (routine x 2)     Status: None (Preliminary result)   Collection Time: 03/01/15  7:15 AM  Result Value Ref Range Status   Specimen Description BLOOD RIGHT ARM  Final   Special Requests BOTTLES DRAWN AEROBIC AND ANAEROBIC 10CC  Final   Culture PENDING  Incomplete   Report Status PENDING  Incomplete     Studies: Dg  Chest 2 View  02/28/2015   CLINICAL DATA:  Difficulty breathing and catching breath since Monday morning. Shortness of breath. Chest pain.  EXAM: CHEST  2 VIEW  COMPARISON:  10/30/2014  FINDINGS: The heart size and mediastinal contours are within normal limits. Both lungs are clear. The visualized skeletal structures are unremarkable.  IMPRESSION: No active cardiopulmonary disease.   Electronically Signed   By: Lucienne Capers M.D.   On: 02/28/2015 23:43   Ct Angio Chest Pe W/cm &/or Wo Cm  03/01/2015   CLINICAL DATA:  Shortness of breath when lying flat. Mild chest pain.  EXAM: CT ANGIOGRAPHY CHEST WITH CONTRAST  TECHNIQUE: Multidetector CT imaging of the chest was performed using the standard protocol during bolus administration of intravenous contrast. Multiplanar CT image reconstructions and MIPs were obtained to evaluate the vascular anatomy.  CONTRAST:  38m OMNIPAQUE IOHEXOL 350 MG/ML SOLN  COMPARISON:  11/09/2007  FINDINGS: Technically adequate study with good opacification of the central and segmental pulmonary arteries. No focal filling defects demonstrated. No evidence of significant pulmonary embolus.  Normal heart size. Normal caliber thoracic aorta. No evidence of aortic dissection. Great vessel origins are patent. Esophagus is decompressed. No significant lymphadenopathy in the chest.  Evaluation of lungs is limited due to respiratory motion artifact. Probable atelectasis in the lung bases. Airways are patent. No pleural effusions. No pneumothorax.  Included portions of the upper abdominal organs are grossly unremarkable. No destructive bone lesions.  Review of the MIP images confirms the above findings.  IMPRESSION: No evidence of significant pulmonary embolus. Atelectasis in the lung bases.   Electronically Signed   By: WLucienne CapersM.D.   On: 03/01/2015 04:34   Mr BJeri CosWFYContrast  03/01/2015   CLINICAL DATA:  Initial valuation for lower extremity weakness, left greater than right.   EXAM: MRI HEAD WITHOUT AND WITH CONTRAST  TECHNIQUE: Multiplanar, multiecho pulse sequences of the brain and surrounding structures were obtained without and with intravenous contrast.  CONTRAST:  279mMULTIHANCE GADOBENATE DIMEGLUMINE 529 MG/ML IV SOLN  COMPARISON:  Prior study from 03/02/2014.  FINDINGS: The CSF containing spaces are within normal limits for patient age. No focal parenchymal signal abnormality is identified. No mass lesion, midline shift, or extra-axial fluid collection. Ventricles are normal in size without evidence of hydrocephalus.  No diffusion-weighted signal abnormality is identified to suggest acute intracranial infarct. Gray-white matter differentiation is maintained. Normal flow voids are seen within the intracranial vasculature. No intracranial hemorrhage identified.  The cervicomedullary junction is normal. Pituitary gland is within normal limits. Pituitary stalk is midline. The globes and optic nerves demonstrate a normal appearance with normal signal intensity. No abnormal enhancement.  The bone marrow signal intensity is normal. Calvarium is intact. Visualized upper cervical spine is within normal limits.  Scalp soft tissues are unremarkable.  Small retention cyst within the right maxillary sinus. Paranasal  sinuses are otherwise clear. No mastoid effusion.  IMPRESSION: Normal brain MRI.   Electronically Signed   By: Jeannine Boga M.D.   On: 03/01/2015 04:55   Mr Lumbar Spine Wo Contrast  02/28/2015   CLINICAL DATA:  Initial valuation for lower back pain, leg pain with bilateral leg weakness.  EXAM: MRI LUMBAR SPINE WITHOUT CONTRAST  TECHNIQUE: Multiplanar, multisequence MR imaging of the lumbar spine was performed. No intravenous contrast was administered.  COMPARISON:  Previous study from 01/24/2008.  FINDINGS: For the purposes of this dictation, the lowest well-formed intervertebral disc spaces presumed to be the L5-S1 level, and there presumed to be 5 lumbar type  vertebral bodies.  Vertebral bodies are normally aligned with preservation of the normal lumbar lordosis. Vertebral body heights are preserved. No fracture or listhesis. No marrow edema. Single benign hemangioma noted within the posterior superior aspect of L2. No other focal osseous lesion.  Conus medullaris terminates normally at the L1 level. Signal intensity within the visualized cord is normal. Nerve roots of the cauda equina are unremarkable.  Paraspinous soft tissues within normal limits. Multiple cystic T2 hyperintense lesions noted within the left ovary, which to the reflects multiple ovarian cyst or possibly complex cystic ovarian lesion. This measures approximately 5.2 x 3.7 x 4.1 cm, indeterminate. Visualized soft tissues otherwise unremarkable.  At T10-11 through L3-4, there is no disc bulge or disc protrusion. No significant facet disease. No canal or foraminal stenosis.  L4-5: Mild diffuse annular disc bulge without focal disc herniation. Mild facet and ligamentous hypertrophy. No significant stenosis.  L5-S1: Mild diffuse annular disc bulge without focal disc herniation. No canal or foraminal stenosis.  IMPRESSION: 1. Minimal degenerative annular disc bulge at L4-5 and L5-S1 without significant stenosis. 2. Mild bilateral facet arthrosis at L4-5. 3. Otherwise normal MRI of the lumbar spine. No significant canal or foraminal stenosis. No neural impingement. 4. Complex cystic left ovarian lesion, incompletely evaluated on this exam. Further evaluation with dedicated pelvic ultrasound could be performed for further assessment as clinically desired.   Electronically Signed   By: Jeannine Boga M.D.   On: 02/28/2015 23:14   Mr Cervical Spine W Wo Contrast  03/01/2015   CLINICAL DATA:  Initial evaluation for bilateral lower extremity weakness, left greater than right.  EXAM: MRI CERVICAL SPINE WITHOUT AND WITH CONTRAST  TECHNIQUE: Multiplanar and multiecho pulse sequences of the cervical spine, to  include the craniocervical junction and cervicothoracic junction, were obtained according to standard protocol without and with intravenous contrast.  CONTRAST:  9m MULTIHANCE GADOBENATE DIMEGLUMINE 529 MG/ML IV SOLN  COMPARISON:  None.  FINDINGS: Visualized portions of the brain and posterior fossa demonstrate a normal appearance with normal signal intensity. Craniocervical junction widely patent.  Vertebral bodies are normally aligned with preservation of the normal cervical lordosis. Vertebral body heights are preserved. Signal intensity within the vertebral body bone marrow is normal. No abnormal enhancement.  Signal intensity within the cervical spinal cord is normal. No focal lesions to suggest underlying demyelinating disease.  Paraspinous soft tissues within normal limits.  C2-C3: Negative.  C3-C4: Broad-based degenerative disc bulge mildly flattens the ventral thecal sac without significant stenosis. Foramina remain widely patent.  C4-C5: Minimal bilateral uncovertebral hypertrophy without significant stenosis.  C5-C6:  Minimal annular disc bulge without significant stenosis.  C6-C7: Minimal annular disc bulge without significant stenosis. Mild left-sided facet arthrosis.  C7-T1:  Negative.  Visualized portions of the upper thoracic spine demonstrate no acute abnormality.  IMPRESSION: 1. Normal MRI appearance  of the cervical spinal cord. No evidence for underlying demyelinating disease or other acute abnormality. 2. Minimal degenerative disc bulging at C3-4, C5-6, and C6-7 without significant stenosis.   Electronically Signed   By: Jeannine Boga M.D.   On: 03/01/2015 05:29   Mr Thoracic Spine W Wo Contrast  03/01/2015   CLINICAL DATA:  Initial evaluation for acute bilateral lower extremity weakness, left greater than right. Concern for possible demyelinating disease.  EXAM: MRI THORACIC SPINE WITHOUT AND WITH CONTRAST  TECHNIQUE: Multiplanar and multiecho pulse sequences of the thoracic spine  were obtained without and with intravenous contrast.  CONTRAST:  38m MULTIHANCE GADOBENATE DIMEGLUMINE 529 MG/ML IV SOLN  COMPARISON:  None available.  FINDINGS: The vertebral bodies are normally aligned with preservation of the normal thoracic kyphosis. Vertebral body heights are preserved. Signal intensity within the vertebral body bone marrow is normal. No marrow edema. Small benign hemangioma noted within the T6 vertebral body. No other focal osseous lesion. No abnormal enhancement.  Signal intensity within the thoracic spinal cord is normal. Small T2 hyperintense signal intensity seen on axial T2 weighted sequence most consistent with artifact, as is seen coursing in an ADushoreof the spinal cord. No focal lesions to suggest underlying demyelinating disease. No abnormal enhancement.  Paraspinous soft tissues within normal limits.  No significant degenerative changes within the thoracic spine. No canal or foraminal stenosis.  IMPRESSION: 1. Normal MRI appearance of the thoracic spinal cord. No findings to suggest underlying demyelinating disease. 2. No significant degenerative changes identified. No significant canal or foraminal stenosis.   Electronically Signed   By: BJeannine BogaM.D.   On: 03/01/2015 05:53    Scheduled Meds: . cyclobenzaprine  10 mg Oral QHS  . diclofenac  75 mg Oral BID  . fluticasone  1 spray Each Nare Daily  . heparin  5,000 Units Subcutaneous 3 times per day  . loratadine  10 mg Oral Daily  . pantoprazole  20 mg Oral Daily  . sodium chloride  3 mL Intravenous Q12H   Continuous Infusions: . sodium chloride 75 mL/hr at 03/01/15 0215      Time spent: [place] [facility] Pager [phone] If 7PM-7AM, please contact night-coverage at [email or web address], password [email or web address]9/22/2016, 1:52 PM  LOS: 0 days

## 2015-03-01 NOTE — Progress Notes (Signed)
Physical Therapy Evaluation Patient Details Name: Lorraine Conrad MRN: [id number] DOB: Nov 30, 1970 Today's Date: 03/01/2015   History of Present Illness  Pt is a 44 yo female admitted with B leg weakness, back pain (long standing), and chest pain with SOB.  Pt has had 3 episodes of this leg weaknes in her lifetime and all have had negative workups.  Pt has negative MS workup.  Troponin negative, MRI shows L4-5, L5-S1 minimal disk bulge which is not new.  Dopplers for LE pending although MD in rounds instructed therapy to progress anyway.  Clinical Impression  Pt admitted with above diagnosis. Pt currently with functional limitations due to the deficits listed below (see PT Problem List). Pt ambulated 150' with RW, mild L foot drop noted. Pt reported cold feeling in B toes, then later felt hot all over with walking. Declined RW for home, agreed to a cane. May benefit from outpatient PT if symptoms persist or worsen.  Pt will benefit from skilled PT to increase their independence and safety with mobility to allow discharge to the venue listed below.       Follow Up Recommendations Outpatient PT    Equipment Recommendations  Cane    Recommendations for Other Services       Precautions / Restrictions Precautions Precautions: Fall Precaution Comments: pt denies h/o falls Restrictions Weight Bearing Restrictions: No      Mobility  Bed Mobility Overal bed mobility: Modified Independent             General bed mobility comments: pt used bedrails.  Transfers Overall transfer level: Needs assistance Equipment used: 1 person hand held assist Transfers: Sit to/from Stand Sit to Stand: Supervision Stand pivot transfers: Supervision       General transfer comment: no physical assist for sit to stand from recliner, used armrests, supervision for safety  Ambulation/Gait Ambulation/Gait assistance: Supervision Ambulation Distance (Feet): 150 Feet Assistive device: Rolling walker (2  wheeled) Gait Pattern/deviations: Step-through pattern;Decreased step length - left;Decreased dorsiflexion - left   Gait velocity interpretation: Below normal speed for age/gender General Gait Details: decreased step length on L, decreased heel strike, pt reported onset of coldness in B toes with walking, then towards end of walk felt hot all over, steady with RW,  no LOB; pt stated she wouldn't use a RW at home as space is limited, agreed to  trying a cane next visit  Stairs            Wheelchair Mobility    Modified Rankin (Stroke Patients Only)       Balance Overall balance assessment: Needs assistance Sitting-balance support: Feet supported Sitting balance-Leahy Scale: Good     Standing balance support: Bilateral upper extremity supported;During functional activity Standing balance-Leahy Scale: Good Standing balance comment: Pt could stand up to do adls w/o outside support for short periods of time.                             Pertinent Vitals/Pain Pain Assessment: 0-10 Pain Score: 6  Pain Location: posterior head and neck Pain Descriptors / Indicators: Burning Pain Intervention(s): Limited activity within patient's tolerance;Monitored during session    [place] expects to be discharged to:: Private residence Living Arrangements: Spouse/significant other;Children (2 kids 10 and 13) Available Help at Discharge: Family Type of Home: House Home Access: Stairs to enter Entrance Stairs-Rails: None Entrance Stairs-Number of Steps: 4 Home Layout: Multi-level;Other (Comment) (split level.  up 3 steps.  does not have to go down) Home Equipment: None Additional Comments: Pt stated she would not be able to use a walker in her house that it is too small. She reached for furniture or her husband for physical support when walking at home. Husband works from home.     Prior Function Level of Independence: Independent         Comments: Prior  to monday of this week, pt was I with all adls and driving.     Hand Dominance   Dominant Hand: Right    Extremity/Trunk Assessment   Upper Extremity Assessment: Overall WFL for tasks assessed           Lower Extremity Assessment: Overall WFL for tasks assessed (L ankle DF +4/5, knee ext and hip flexion +4/5)      Cervical / Trunk Assessment: Normal  Communication   Communication: No difficulties  Cognition Arousal/Alertness: Awake/alert Behavior During Therapy: WFL for tasks assessed/performed Overall Cognitive Status: Within Functional Limits for tasks assessed                      General Comments General comments (skin integrity, edema, etc.): Overall pt doing okay with adls. Pt c/o a lot of pain, sensory changes in LEs but is able to complete adls slowly w/o physical assist.    Exercises General Exercises - Lower Extremity Ankle Circles/Pumps: AROM;Left;10 reps;Supine Toe Raises: AROM;Both;5 reps (pt reported L knee pain with this exercise so DCed)      Assessment/Plan    PT Assessment Patient needs continued PT services  PT Diagnosis Difficulty walking   PT Problem List Decreased activity tolerance;Decreased mobility;Pain  PT Treatment Interventions Gait training;Functional mobility training;Therapeutic activities;Patient/family education;Therapeutic exercise   PT Goals (Current goals can be found in the Care Plan section) Acute Rehab PT Goals Patient Stated Goal: to go home since it is her daughters bday today PT Goal Formulation: With patient Time For Goal Achievement: 03/15/15 Potential to Achieve Goals: Good    Frequency Min 3X/week   Barriers to discharge        Co-evaluation               End of Session Equipment Utilized During Treatment: Gait belt Activity Tolerance: Patient limited by fatigue Patient left: in chair;with call bell/phone within reach Nurse Communication: Mobility status         Time: 8546-2703 PT Time  Calculation (min) (ACUTE ONLY): 39 min   Charges:   PT Evaluation $Initial PT Evaluation Tier I: 1 Procedure PT Treatments $Gait Training: 8-22 mins $Therapeutic Exercise: 8-22 mins   PT G Codes:        Philomena Doheny 03/01/2015, 12:09 PM [phone]

## 2015-03-01 NOTE — H&P (Signed)
[facility] History and Physical  [name] YHC:[id number] DOB: 19-Mar-1971 DOA: 02/28/2015  Referring physician: ED physician PCP: Mackie Pai, PA-C  Specialists:   Chief Complaint: Bilateral leg weakness, back pain, chest pain, SOB.  HPI: Lorraine Conrad is a 44 y.o. female with PMH of OSA on CPAP, migraine headaches, lower back pain, proctalgia fugax, obesity, GERD, meningitis, who presents with bilateral leg weakness, back pain, chest pain and shortness of breath.  Patient reports that she has chronic lower back pain, which has been getting worse since yesterday. She also has a bilateral leg weakness. No urinary incontinence or loss control of bowel movements. She has burning sensation over posterior head and bilateral shoulders. She also has intermittent dysuria and burning on urination which has been going on for a long time after she had hysterectomy. No vision change or hearing loss. No slurred speech, facial droop or saddle anesthesia. She also reports having mild chest pain and shortness of breath. Her chest pain is located in the middle of her chest, radiating to left arm, intermittent. She has tenderness over bilateral calf areas. She states that she traveled to [place] on 9/11 by driving 4 hours and drove back after stained therefore 3 days. Of note, this is 3rd episode in life of bilateral LE weakness of unclear etiology. Pt states that she had unrevealing MS evaluation by neurology few years ago. ANA and RF were negative on 05/08/14.  In ED, patient was found to have negative urinalysis, negative troponin, positive UDS for opiates, WBC 12.3, TSH normal. temperature normal, no tachycardia, electrolytes okay, negative chest x-ray for acute abnormalities. MRI of lumbar spine showed minimal degenerative annular disc bulge at L4-5 and L5-S1 without significant stenosis, mild bilateral facet arthrosis at L4-5, complex cystic left ovarian lesion, incompletely evaluated on this exam.  Patient is admitted to inpatient for further eval and treatment. Neurology was consulted.   Where does patient live?   At home    Can patient participate in ADLs?  Yes    Review of Systems:   General: no fevers, chills, no changes in body weight, has poor appetite, has fatigue HEENT: no blurry vision, hearing changes or sore throat Pulm: has dyspnea, no coughing, wheezing CV: has chest pain, no palpitations Abd: no nausea, vomiting, abdominal pain, diarrhea, constipation GU: has dysuria, burning on urination, no increased urinary frequency, hematuria  Ext: no leg edema Neuro: has leg weakness, no vision change or hearing loss Skin: no rash MSK: No muscle spasm, no deformity, no limitation of range of movement in spin Heme: No easy bruising.  Travel history: No recent long distant travel.  Allergy:  Allergies  Allergen Reactions  . Promethazine Hcl Other (See Comments)    Violent tremors   . Cephalexin Diarrhea and Itching  . Codeine Other (See Comments)    Was a child when she took this.  . Dilaudid [Hydromorphone] Nausea And Vomiting  . Azithromycin Rash    Head to toe rash    Past Medical History  Diagnosis Date  . GERD (gastroesophageal reflux disease)   . Endometriosis   . Ovarian cyst, left   . External hemorrhoids   . Erosive gastritis   . Proctalgia fugax   . Allergy     SEASONAL  . Anemia   . OSA on CPAP   . Migraine 1998; 02/2014    "this one's lasted 10 days straight" (03/01/2014)  . Headache     "probably monthly" (03/01/2014)  . Arthritis     "  spine" (03/01/2014)  . Chronic lower back pain   . H/O shortness of breath 2014    Cardiopulmonary exercise test results    Past Surgical History  Procedure Laterality Date  . Cesarean section  2002; 2006  . Temporomandibular joint surgery  ~ 1992  . Nasal septum surgery  ~ 1991  . Laparoscopic ovarian cystectomy  ~ 1999  . Upper gastrointestinal endoscopy    . Colonoscopy    . Abdominal hysterectomy   04/2013  . Breast biopsy Bilateral   . Breast lumpectomy Left ~ 1993    Social History:  reports that she has never smoked. She has never used smokeless tobacco. She reports that she drinks alcohol. She reports that she does not use illicit drugs.  Family History:  Family History  Problem Relation Age of Onset  . Breast cancer Maternal Grandmother   . Colon cancer Neg Hx   . Prostate cancer Paternal Grandfather   . Colon polyps Father   . Heart disease Father      Prior to Admission medications   Medication Sig Start Date End Date Taking? Authorizing Provider  acetaminophen (TYLENOL) 500 MG tablet Take 1 tablet (500 mg total) by mouth every 6 (six) hours as needed for mild pain or headache. For pain 03/04/14  Yes Domenic Polite, MD  albuterol (PROVENTIL HFA;VENTOLIN HFA) 108 (90 BASE) MCG/ACT inhaler Inhale 2 puffs into the lungs every 6 (six) hours as needed for wheezing or shortness of breath. 10/30/14  Yes Mackie Pai, PA-C  cyclobenzaprine (FLEXERIL) 10 MG tablet Take 1 tablet (10 mg total) by mouth at bedtime. 12/12/14  Yes Edward Saguier, PA-C  diclofenac (VOLTAREN) 75 MG EC tablet Take 1 tablet (75 mg total) by mouth 2 (two) times daily. 10/30/14  Yes Edward Saguier, PA-C  lansoprazole (PREVACID) 30 MG capsule Take 30 mg by mouth daily at 12 noon.   Yes Historical Provider, MD  loratadine (CLARITIN) 10 MG tablet Take 1 tablet (10 mg total) by mouth daily. 12/12/14  Yes Edward Saguier, PA-C  mometasone (NASONEX) 50 MCG/ACT nasal spray 2 sprays each nostril q day 12/12/14  Yes Edward Saguier, PA-C  doxycycline (VIBRA-TABS) 100 MG tablet Take 1 tablet (100 mg total) by mouth 2 (two) times daily. Patient not taking: Reported on 02/28/2015 02/16/15   Mackie Pai, PA-C    Physical Exam: Filed Vitals:   03/01/15 0030 03/01/15 0045 03/01/15 0115 03/01/15 0130  BP: 155/78 147/84 117/65 126/71  Pulse: 80 80 84 83  Temp:      TempSrc:      Resp: 15 19 21    Height:      Weight:       SpO2: 100% 99% 99% 97%   General: Not in acute distress HEENT:       Eyes: PERRL, EOMI, no scleral icterus.       ENT: No discharge from the ears and nose, no pharynx injection, no tonsillar enlargement.        Neck: No JVD, no bruit, no mass felt. Heme: No neck lymph node enlargement. Cardiac: S1/S2, RRR, No murmurs, No gallops or rubs. Pulm:  No rales, wheezing, rhonchi or rubs. Abd: Soft, nondistended, nontender, no rebound pain, no organomegaly, BS present. Ext: No pitting leg edema bilaterally. 2+DP/PT pulse bilaterally. Has tenderness in lower leg circumferentially, worse over calf areas. Musculoskeletal: No joint deformities, No joint redness or warmth, no limitation of ROM in spin. Skin: No rashes.  Neuro: Alert, oriented X3, cranial nerves II-XII grossly intact,  muscle strength 5/5 in all extremities, sensation to light touch intact. Brachial reflex 2+ bilaterally. Knee reflex 1+ bilaterally. Negative Babinski's sign. Normal finger to nose test. Psych: Patient is not psychotic, no suicidal or hemocidal ideation.  Labs on Admission:  Basic Metabolic Panel:  Recent Labs Lab 02/28/15 2030  NA 139  K 4.1  CL 105  CO2 27  GLUCOSE 93  BUN 7  CREATININE 0.96  CALCIUM 9.6   Liver Function Tests:  Recent Labs Lab 02/28/15 2030  AST 15  ALT 14  ALKPHOS 84  BILITOT 0.3  PROT 7.1  ALBUMIN 3.7   No results for input(s): LIPASE, AMYLASE in the last 168 hours. No results for input(s): AMMONIA in the last 168 hours. CBC:  Recent Labs Lab 02/28/15 2030  WBC 12.3*  NEUTROABS 8.7*  HGB 13.5  HCT 41.0  MCV 86.3  PLT 430*   Cardiac Enzymes: No results for input(s): CKTOTAL, CKMB, CKMBINDEX, TROPONINI in the last 168 hours.  BNP (last 3 results) No results for input(s): BNP in the last 8760 hours.  ProBNP (last 3 results) No results for input(s): PROBNP in the last 8760 hours.  CBG:  Recent Labs Lab 02/28/15 2027  GLUCAP 88    Radiological Exams on  Admission: Dg Chest 2 View  02/28/2015   CLINICAL DATA:  Difficulty breathing and catching breath since Monday morning. Shortness of breath. Chest pain.  EXAM: CHEST  2 VIEW  COMPARISON:  10/30/2014  FINDINGS: The heart size and mediastinal contours are within normal limits. Both lungs are clear. The visualized skeletal structures are unremarkable.  IMPRESSION: No active cardiopulmonary disease.   Electronically Signed   By: Lucienne Capers M.D.   On: 02/28/2015 23:43   Mr Lumbar Spine Wo Contrast  02/28/2015   CLINICAL DATA:  Initial valuation for lower back pain, leg pain with bilateral leg weakness.  EXAM: MRI LUMBAR SPINE WITHOUT CONTRAST  TECHNIQUE: Multiplanar, multisequence MR imaging of the lumbar spine was performed. No intravenous contrast was administered.  COMPARISON:  Previous study from 01/24/2008.  FINDINGS: For the purposes of this dictation, the lowest well-formed intervertebral disc spaces presumed to be the L5-S1 level, and there presumed to be 5 lumbar type vertebral bodies.  Vertebral bodies are normally aligned with preservation of the normal lumbar lordosis. Vertebral body heights are preserved. No fracture or listhesis. No marrow edema. Single benign hemangioma noted within the posterior superior aspect of L2. No other focal osseous lesion.  Conus medullaris terminates normally at the L1 level. Signal intensity within the visualized cord is normal. Nerve roots of the cauda equina are unremarkable.  Paraspinous soft tissues within normal limits. Multiple cystic T2 hyperintense lesions noted within the left ovary, which to the reflects multiple ovarian cyst or possibly complex cystic ovarian lesion. This measures approximately 5.2 x 3.7 x 4.1 cm, indeterminate. Visualized soft tissues otherwise unremarkable.  At T10-11 through L3-4, there is no disc bulge or disc protrusion. No significant facet disease. No canal or foraminal stenosis.  L4-5: Mild diffuse annular disc bulge without focal  disc herniation. Mild facet and ligamentous hypertrophy. No significant stenosis.  L5-S1: Mild diffuse annular disc bulge without focal disc herniation. No canal or foraminal stenosis.  IMPRESSION: 1. Minimal degenerative annular disc bulge at L4-5 and L5-S1 without significant stenosis. 2. Mild bilateral facet arthrosis at L4-5. 3. Otherwise normal MRI of the lumbar spine. No significant canal or foraminal stenosis. No neural impingement. 4. Complex cystic left ovarian lesion, incompletely evaluated on  this exam. Further evaluation with dedicated pelvic ultrasound could be performed for further assessment as clinically desired.   Electronically Signed   By: Jeannine Boga M.D.   On: 02/28/2015 23:14    EKG: Independently reviewed. No ischemic change.  Assessment/Plan Principal Problem:   Leg weakness, bilateral Active Problems:   GERD   PROCTALGIA FUGAX   Chest pain   IBS (irritable bowel syndrome)   Psoriasis   Obesity (BMI 30-39.9)   Back pain   SOB (shortness of breath)   OSA (obstructive sleep apnea)   Leukocytosis  Bilateral leg weakness: Etiology is not clear. Neuro-exam reveals no objective weakness. Neurology was consulted, Dr. Aram Beecham invited the patient. -will admit to tele bed -Appreciate Dr. Glennon Hamilton consultation -follow-up recommendations to get MRI brain and cervico-thoracic spine with and without contrast.  Back pain: MRI of lumbar spine showed minimal degenerative annular disc bulge at L4-5 and L5-S1 without significant stenosis, mild bilateral facet arthrosis at L4-5. Patient also has upper back pain -Will follow-up MRI of the thoracic spine and C-spine -Flexeril, Voltaren, when necessary morphine  GERD: -Protonix  Chest pain and SOB: Etiology is not clear. Patient has bilateral calf tenderness and recent traveling, needs to rule out PE.  -will get CTA to r/o PE -trop x 3 to r/o ACS -repeat EKG in AM -LE doppler to r/o DVT  OSA: -CPAP  Leukocytosis: no  signs of infection. Likely due to stress induced to de-margination. -will follow up blood and urine culture -follow up by CBC  DVT ppx: SQ Heparin      Code Status: Full code Family Communication:  Yes, patient's husband at bed side Disposition Plan: Admit to inpatient   Date of Service 03/01/2015    Ivor Costa [facility] Pager [phone]  If 7PM-7AM, please contact night-coverage [email or web address] Password [facility] 03/01/2015, 4:07 AM

## 2015-03-01 NOTE — Consult Note (Signed)
NEURO HOSPITALIST CONSULT NOTE   Referring physician: ED Reason for Consult: bilateral LE weakness, low back pain  HPI:                                                                                                                                          Lorraine Conrad is an 44 y.o. female with a past medical history that is relevant for GERD, emdometriosis, erosive gastritis, and proctalgia fugax, comes in accompanied by her husband for evaluation fo the above stated symptoms. Patient indicated that since around noon time yesterday she has been experiencing worsening burning pain in her lower back as well as weakness of the lower extremities L > R. She said that she had trouble walking at home because she couldn't pick up her left foot and was kind of dragging the left leg. The back pain today remains localized to the lower back. No bladder or bowel impairment. No recent low back trauma. No fever, infection, vaccination, or diarrhea. Lorraine Conrad said the her feet feel " ice cold" and tingly at times. Complains of a burning sensation in the back of the head as well as head pressure. No vertigo, double vision, weakness upper extremities, slurred speech, language or vision impairment. She reports 2 prior episodes of LE weakness lasting for 2-3 days and few years ago was seen by a neurologist due to concern for MS. Her symptoms prompted MRI L spine last night which I personally reviewed and did not reveal lesions to explain her symptoms.  Past Medical History  Diagnosis Date  . GERD (gastroesophageal reflux disease)   . Endometriosis   . Ovarian cyst, left   . External hemorrhoids   . Erosive gastritis   . Proctalgia fugax   . Allergy     SEASONAL  . Anemia   . OSA on CPAP   . Migraine 1998; 02/2014    "this one's lasted 10 days straight" (03/01/2014)  . Headache     "probably monthly" (03/01/2014)  . Arthritis     "spine" (03/01/2014)  . Chronic lower back pain   . H/O  shortness of breath 2014    Cardiopulmonary exercise test results    Past Surgical History  Procedure Laterality Date  . Cesarean section  2002; 2006  . Temporomandibular joint surgery  ~ 1992  . Nasal septum surgery  ~ 1991  . Laparoscopic ovarian cystectomy  ~ 1999  . Upper gastrointestinal endoscopy    . Colonoscopy    . Abdominal hysterectomy  04/2013  . Breast biopsy Bilateral   . Breast lumpectomy Left ~ 1993    Family History  Problem Relation Age of Onset  . Breast cancer Maternal Grandmother   . Colon cancer Neg Hx   . Prostate cancer Paternal Grandfather   .  Colon polyps Father   . Heart disease Father     Family History: no MS, brain tumor, epilepsy, or brain aneurysm  Social History:  reports that she has never smoked. She has never used smokeless tobacco. She reports that she drinks alcohol. She reports that she does not use illicit drugs.  Allergies  Allergen Reactions  . Promethazine Hcl Other (See Comments)    Violent tremors   . Cephalexin Diarrhea and Itching  . Codeine Other (See Comments)    Was a child when she took this.  . Dilaudid [Hydromorphone] Nausea And Vomiting  . Azithromycin Rash    Head to toe rash    MEDICATIONS:                                                                                                                     I have reviewed the patient's current medications.   ROS:                                                                                                                                       History obtained from chart review and the patient  General ROS: negative for - chills, fatigue, fever, night sweats, or weight loss Psychological ROS: negative for - behavioral disorder, hallucinations, memory difficulties, mood swings or suicidal ideation Ophthalmic ROS: negative for - blurry vision, double vision, eye pain or loss of vision ENT ROS: negative for - epistaxis, nasal discharge, oral lesions, sore throat,  tinnitus or vertigo Allergy and Immunology ROS: negative for - hives or itchy/watery eyes Hematological and Lymphatic ROS: negative for - bleeding problems, bruising or swollen lymph nodes Endocrine ROS: negative for - galactorrhea, hair pattern changes, polydipsia/polyuria or temperature intolerance Respiratory ROS: negative for - cough, hemoptysis, shortness of breath or wheezing Cardiovascular ROS: negative for - chest pain, dyspnea on exertion, edema or irregular heartbeat Gastrointestinal ROS: negative for - abdominal pain, diarrhea, hematemesis, nausea/vomiting or stool incontinence Genito-Urinary ROS: negative for - dysuria, hematuria, incontinence or urinary frequency/urgency Musculoskeletal ROS: negative for - joint swelling  Neurological ROS: as noted in HPI Dermatological ROS: negative for rash and skin lesion changes   Physical exam:  Constitutional: well developed, pleasant female in no apparent distress. Blood pressure 129/89, pulse 74, temperature 98.1 F (36.7 C), temperature source Oral, resp. rate 22, height '5\' 4"'  (1.626 m), weight 95.709 kg (211 lb), last menstrual period 02/08/2013, SpO2  100 %. Eyes: no jaundice or exophthalmos.  Head: normocephalic. Neck: supple, no bruits, no JVD. Cardiac: no murmurs. Lungs: clear. Abdomen: soft, no tender, no mass. Extremities: no edema, clubbing, or cyanosis.  Skin: no rash  Neurologic Examination:                                                                                                      General: NAD Mental Status: Alert, oriented, thought content appropriate.  Speech fluent without evidence of aphasia.  Able to follow 3 step commands without difficulty. Cranial Nerves: II: Discs flat bilaterally; Visual fields grossly normal, pupils equal, round, reactive to light and accommodation III,IV, VI: ptosis not present, extra-ocular motions intact bilaterally V,VII: smile symmetric, facial light touch sensation normal  bilaterally VIII: hearing normal bilaterally IX,X: uvula rises symmetrically XI: bilateral shoulder shrug XII: midline tongue extension without atrophy or fasciculations Motor: Brisk all over but no clonus Tone and bulk:normal tone throughout; no atrophy noted Sensory: Pinprick and light touch intact throughout, bilaterally Deep Tendon Reflexes:  Right: Upper Extremity   Left: Upper extremity   biceps (C-5 to C-6) 2/4   biceps (C-5 to C-6) 2/4 tricep (C7) 2/4    triceps (C7) 2/4 Brachioradialis (C6) 2/4  Brachioradialis (C6) 2/4  Lower Extremity Lower Extremity  quadriceps (L-2 to L-4) 2/4   quadriceps (L-2 to L-4) 2/4 Achilles (S1) 2/4   Achilles (S1) 2/4  Plantars: Right: downgoing   Left: downgoing Cerebellar: normal finger-to-nose,  normal heel-to-shin test Gait:  No tested due to multiple leads    Lab Results  Component Value Date/Time   CHOL 223* 01/16/2014 09:46 AM    Results for orders placed or performed during the hospital encounter of 02/28/15 (from the past 48 hour(s))  CBG monitoring, ED     Status: None   Collection Time: 02/28/15  8:27 PM  Result Value Ref Range   Glucose-Capillary 88 65 - 99 mg/dL  CBC with Differential     Status: Abnormal   Collection Time: 02/28/15  8:30 PM  Result Value Ref Range   WBC 12.3 (H) 4.0 - 10.5 K/uL   RBC 4.75 3.87 - 5.11 MIL/uL   Hemoglobin 13.5 12.0 - 15.0 g/dL   HCT 41.0 36.0 - 46.0 %   MCV 86.3 78.0 - 100.0 fL   MCH 28.4 26.0 - 34.0 pg   MCHC 32.9 30.0 - 36.0 g/dL   RDW 13.9 11.5 - 15.5 %   Platelets 430 (H) 150 - 400 K/uL   Neutrophils Relative % 71 %   Neutro Abs 8.7 (H) 1.7 - 7.7 K/uL   Lymphocytes Relative 21 %   Lymphs Abs 2.6 0.7 - 4.0 K/uL   Monocytes Relative 6 %   Monocytes Absolute 0.8 0.1 - 1.0 K/uL   Eosinophils Relative 2 %   Eosinophils Absolute 0.2 0.0 - 0.7 K/uL   Basophils Relative 0 %   Basophils Absolute 0.0 0.0 - 0.1 K/uL  Comprehensive metabolic panel     Status: None   Collection  Time: 02/28/15  8:30 PM  Result Value  Ref Range   Sodium 139 135 - 145 mmol/L   Potassium 4.1 3.5 - 5.1 mmol/L   Chloride 105 101 - 111 mmol/L   CO2 27 22 - 32 mmol/L   Glucose, Bld 93 65 - 99 mg/dL   BUN 7 6 - 20 mg/dL   Creatinine, Ser 0.96 0.44 - 1.00 mg/dL   Calcium 9.6 8.9 - 10.3 mg/dL   Total Protein 7.1 6.5 - 8.1 g/dL   Albumin 3.7 3.5 - 5.0 g/dL   AST 15 15 - 41 U/L   ALT 14 14 - 54 U/L   Alkaline Phosphatase 84 38 - 126 U/L   Total Bilirubin 0.3 0.3 - 1.2 mg/dL   GFR calc non Af Amer >60 >60 mL/min   GFR calc Af Amer >60 >60 mL/min    Comment: (NOTE) The eGFR has been calculated using the CKD EPI equation. This calculation has not been validated in all clinical situations. eGFR's persistently <60 mL/min signify possible Chronic Kidney Disease.    Anion gap 7 5 - 15  TSH     Status: None   Collection Time: 02/28/15  8:30 PM  Result Value Ref Range   TSH 2.226 0.350 - 4.500 uIU/mL  I-stat troponin, ED     Status: None   Collection Time: 02/28/15 10:08 PM  Result Value Ref Range   Troponin i, poc 0.00 0.00 - 0.08 ng/mL   Comment 3            Comment: Due to the release kinetics of cTnI, a negative result within the first hours of the onset of symptoms does not rule out myocardial infarction with certainty. If myocardial infarction is still suspected, repeat the test at appropriate intervals.   Urinalysis, Routine w reflex microscopic (not at [facility])     Status: Abnormal   Collection Time: 02/28/15 11:44 PM  Result Value Ref Range   Color, Urine YELLOW YELLOW   APPearance CLEAR CLEAR   Specific Gravity, Urine 1.009 1.005 - 1.030   pH 5.5 5.0 - 8.0   Glucose, UA NEGATIVE NEGATIVE mg/dL   Hgb urine dipstick SMALL (A) NEGATIVE   Bilirubin Urine NEGATIVE NEGATIVE   Ketones, ur NEGATIVE NEGATIVE mg/dL   Protein, ur NEGATIVE NEGATIVE mg/dL   Urobilinogen, UA 0.2 0.0 - 1.0 mg/dL   Nitrite NEGATIVE NEGATIVE   Leukocytes, UA NEGATIVE NEGATIVE  Urine microscopic-add  on     Status: Abnormal   Collection Time: 02/28/15 11:44 PM  Result Value Ref Range   Squamous Epithelial / LPF FEW (A) RARE   WBC, UA 0-2 <3 WBC/hpf   RBC / HPF 3-6 <3 RBC/hpf   Bacteria, UA RARE RARE   Urine-Other MUCOUS PRESENT     Dg Chest 2 View  02/28/2015   CLINICAL DATA:  Difficulty breathing and catching breath since Monday morning. Shortness of breath. Chest pain.  EXAM: CHEST  2 VIEW  COMPARISON:  10/30/2014  FINDINGS: The heart size and mediastinal contours are within normal limits. Both lungs are clear. The visualized skeletal structures are unremarkable.  IMPRESSION: No active cardiopulmonary disease.   Electronically Signed   By: Lucienne Capers M.D.   On: 02/28/2015 23:43   Mr Lumbar Spine Wo Contrast  02/28/2015   CLINICAL DATA:  Initial valuation for lower back pain, leg pain with bilateral leg weakness.  EXAM: MRI LUMBAR SPINE WITHOUT CONTRAST  TECHNIQUE: Multiplanar, multisequence MR imaging of the lumbar spine was performed. No intravenous contrast was administered.  COMPARISON:  Previous study from 01/24/2008.  FINDINGS: For the purposes of this dictation, the lowest well-formed intervertebral disc spaces presumed to be the L5-S1 level, and there presumed to be 5 lumbar type vertebral bodies.  Vertebral bodies are normally aligned with preservation of the normal lumbar lordosis. Vertebral body heights are preserved. No fracture or listhesis. No marrow edema. Single benign hemangioma noted within the posterior superior aspect of L2. No other focal osseous lesion.  Conus medullaris terminates normally at the L1 level. Signal intensity within the visualized cord is normal. Nerve roots of the cauda equina are unremarkable.  Paraspinous soft tissues within normal limits. Multiple cystic T2 hyperintense lesions noted within the left ovary, which to the reflects multiple ovarian cyst or possibly complex cystic ovarian lesion. This measures approximately 5.2 x 3.7 x 4.1 cm,  indeterminate. Visualized soft tissues otherwise unremarkable.  At T10-11 through L3-4, there is no disc bulge or disc protrusion. No significant facet disease. No canal or foraminal stenosis.  L4-5: Mild diffuse annular disc bulge without focal disc herniation. Mild facet and ligamentous hypertrophy. No significant stenosis.  L5-S1: Mild diffuse annular disc bulge without focal disc herniation. No canal or foraminal stenosis.  IMPRESSION: 1. Minimal degenerative annular disc bulge at L4-5 and L5-S1 without significant stenosis. 2. Mild bilateral facet arthrosis at L4-5. 3. Otherwise normal MRI of the lumbar spine. No significant canal or foraminal stenosis. No neural impingement. 4. Complex cystic left ovarian lesion, incompletely evaluated on this exam. Further evaluation with dedicated pelvic ultrasound could be performed for further assessment as clinically desired.   Electronically Signed   By: Jeannine Boga M.D.   On: 02/28/2015 23:14   Assessment/Plan: 44 y/o presents with complains of bilateral LE weakness, low back pain since yesterday. Neuro-exam reveals no objective weakness, generalized hyperreflexia without clonus. MRI L spine unremarkable. Of note, this is 3rd episode in life of bilateral LE weakness of unclear etiology. According to patient, she had unrevealing MS evaluation by neurology few years ago. Recommend: MRI brain and cervico-thoracic spine with and without contrast. Will follow up after neuro resting results.  Dorian Pod, MD 03/01/2015, 12:52 AM  [facility]

## 2015-03-01 NOTE — Progress Notes (Signed)
Patient discharged to home with family. IV dc'd, Telemetry Dc'd. Vitals stable for pt. Discharge and education reviewed and all questions addressed. 03/01/2015 5:37 PM [name],Lorraine Conrad

## 2015-03-01 NOTE — Progress Notes (Signed)
VASCULAR LAB PRELIMINARY  PRELIMINARY  PRELIMINARY  PRELIMINARY  Bilateral lower extremity venous duplex completed.    Preliminary report:  There is no DVT or SVT noted in the bilateral lower extremities.   KANADY, CANDACE, RVT 03/01/2015, 2:26 PM

## 2015-03-02 LAB — ACETYLCHOLINE RECEPTOR, BINDING: Acety choline binding ab: 0.05 nmol/L (ref 0.00–0.24)

## 2015-03-02 LAB — VITAMIN B1: VITAMIN B1 (THIAMINE): 110.2 nmol/L (ref 66.5–200.0)

## 2015-03-02 LAB — URINE CULTURE: Culture: >=100000

## 2015-03-02 LAB — HEAVY METALS, BLOOD
ARSENIC: 5 ug/L (ref 2–23)
Lead: NOT DETECTED ug/dL (ref 0–19)
Mercury: NOT DETECTED ug/L (ref 0.0–14.9)

## 2015-03-02 LAB — STRIATED MUSCLE ANTIBODY: Anti-striation Abs: NEGATIVE

## 2015-03-06 LAB — CULTURE, BLOOD (ROUTINE X 2)
CULTURE: NO GROWTH
Culture: NO GROWTH

## 2015-03-09 ENCOUNTER — Encounter: Payer: Self-pay | Admitting: Medical

## 2015-03-09 ENCOUNTER — Ambulatory Visit (INDEPENDENT_AMBULATORY_CARE_PROVIDER_SITE_OTHER): Payer: BLUE CROSS/BLUE SHIELD | Admitting: Medical

## 2015-03-09 VITALS — BP 142/84 | HR 107 | Temp 98.0°F | Resp 16 | Ht 64.0 in | Wt 212.1 lb

## 2015-03-09 DIAGNOSIS — M5126 Other intervertebral disc displacement, lumbar region: Secondary | ICD-10-CM

## 2015-03-09 DIAGNOSIS — M5410 Radiculopathy, site unspecified: Secondary | ICD-10-CM

## 2015-03-09 DIAGNOSIS — M792 Neuralgia and neuritis, unspecified: Secondary | ICD-10-CM | POA: Diagnosis not present

## 2015-03-09 DIAGNOSIS — R29898 Other symptoms and signs involving the musculoskeletal system: Secondary | ICD-10-CM

## 2015-03-09 MED ORDER — BECLOMETHASONE DIPROPIONATE 40 MCG/ACT IN AERS: 2.0000 | INHALATION_SPRAY | Freq: Two times a day (BID) | RESPIRATORY_TRACT | Status: DC

## 2015-03-09 MED ORDER — CYCLOBENZAPRINE HCL 10 MG PO TABS: 10.0000 mg | ORAL_TABLET | Freq: Every day | ORAL | Status: DC

## 2015-03-09 NOTE — Patient Instructions (Addendum)
With your extremity weakness. I am going to refer to neurologist since your extensive work up was negative.  With your lower back pain, buldging disk and radicular pain I will refer you to neurosurgeon. If you symptoms worsen or change as described then ED evaluation. I will refill you flexeril Continue diclofenac.  For your history of intermittent dyspnea with partial response to albuterol will add qvar.  You had negative d-dimer and negative doppler studies in last week. So I won't repeat these studies today. If your breathing changes dramatically then ED eval. Follow up with [place] if needed prior to specialist. But would like to see you 1-2 weeks post specialist appointments.

## 2015-03-09 NOTE — Progress Notes (Signed)
Pre visit review using our clinic review tool, if applicable. No additional management support is needed unless otherwise documented below in the visit note. 

## 2015-03-09 NOTE — Progress Notes (Signed)
Subjective:    Patient ID: Lorraine Conrad, female    DOB: November 28, 1970, 44 y.o.   MRN: [id number]  HPI   Pt in for follow up post hospitalization . Pt has persisting gait problems. She is walking very slowly. She still has sensation of cold lower extremities at times at times and left lower ext weakness sensation. But not  like before.  On the last visit she complained of the below portion in (  ) pulled from last note.  (She had some bilateral leg region pain. Worst on the left side than rt side. She woke up Monday am and rolled in bed and had excrutiating lt knee pain. Next day pain went away. But some slight pain behind both knees/popliteal pain since. On way to office she had bilateral calf pain.  Pt has some recent sob episodes this am. Yesterday felt sob sensation all day. Described yesteday sob as prominen.  Pt had mentioned this in the past that was transient short of breath episodes. I gave albuterol inhaler as trial in past and she states it did help. But not recently.  Around mid summer she mentioned some pressure sensation in in occipital and on top of head. This has been case since summer. States if laughs or cough head hurts. No neck tightness and no gross motor or sensory or sensory function deficits. Some times even light laugh will give head pressure sensation.  Acute onset of very abnormal gait today. Pt demonstrates toward end of the exam she has difficulty walking. This ends last note.)    I sent to ED due to the  Above varius problems and she was admitted. Extensive lab work up were negative. Among all of her labs which  were negative including c-reactive protein, acety choline binding ab, striated muscle antibody, vit 12, vit b1, heavy metals, urine culture, blood culture, troponin, and  Hiv(all negative).Pt sed rate was minimal elevated.   Imaging studies of brain n were negative. Cspine mri showed minimal degernerative disc buldging without stenosis. Her mri of lower  back showed some bulging disc.  l4-l5 level.     Pt does report some post hospitalization  tightening sensation in her back as well. She states couple of days before some pain in her lower back. This pain persists. Pain level in lower back is between 5 and 6/10 level pain. She reports some pain radiating to from her lower back to her left leg. Pain radiates from lower back to lt hip and towards  lateral aspect to lateral  thigh and calf. Some tingling to foot. No numbness. No incontinece reported.  Pt states low back pain off and on for several years. With some radiating pain.   Pt is on diclofenec. And gave flexeril on dc. She is out of flexeril. She states this helps her neck pain and low back pain.  Pt was advised to see neurologist in 2 wks.  Pt mentioned she did have some shortness of breath mild intermittent and helped with albuterol. Pt ct angiogram and doppler studies were negative in hospital.    Review of Systems  See hpi.    Objective:   Physical Exam  General Mental Status- Alert. General Appearance- Not in acute distress.   Skin General: Color- Normal Color. Moisture- Normal Moisture.  Neck Carotid Arteries- Normal color. Moisture- Normal Moisture. No carotid bruits. No JVD.  Chest and Lung Exam Auscultation: Breath Sounds:-Normal.  Cardiovascular Auscultation:Rythm- Regular. Murmurs & Other Heart Sounds:Auscultation of the heart reveals-  No Murmurs.  Abdomen Inspection:-Inspeection Normal. Palpation/Percussion:Note:No mass. Palpation and Percussion of the abdomen reveal- Non Tender, Non Distended + BS, no rebound or guarding.    Neurologic Cranial Nerve exam:- CN III-XII intact(No nystagmus), symmetric smile. Drift Test:- No drift. Romberg Exam:- Negative.  Heal to Toe Gait exam:-Normal. Finger to Nose:- Normal/Intact Strength:- 5/5 equal and symmetric strength both upper and lower extremities.   Lower ext neurologic  L5-S1 sensation intact  bilaterally. Normal patellar reflexes bilaterally. No foot drop bilaterally.      Assessment & Plan:  With your extremity weakness. I am going to refer to neurologist since your extensive work up was negative.  With your lower back pain, buldging disk and radicular pain I will refer you to neurosurgeon. If you symptoms worsen or change as described then ED evaluation. I will refill you flexeril Continue diclofenac.  For your history of intermittent dyspnea with partial response to albuterol will add qvar.  You had negative d-dimer and negative doppler studies in last week. So I won't repeat these studies today. If your breathing changes dramatically then ED eval. Follow up with [place] if needed prior to specialist. But would like to see you 1-2 weeks post specialist appointments.

## 2015-03-13 ENCOUNTER — Telehealth: Payer: Self-pay | Admitting: Behavioral Health

## 2015-03-13 NOTE — Telephone Encounter (Signed)
Per the patient, she cancelled the appointment for tomorrow, 03/14/15.

## 2015-03-14 ENCOUNTER — Encounter: Payer: BLUE CROSS/BLUE SHIELD | Admitting: Medical

## 2015-03-19 ENCOUNTER — Ambulatory Visit (INDEPENDENT_AMBULATORY_CARE_PROVIDER_SITE_OTHER): Payer: BLUE CROSS/BLUE SHIELD | Admitting: Neurology

## 2015-03-19 ENCOUNTER — Telehealth: Payer: Self-pay | Admitting: *Deleted

## 2015-03-19 ENCOUNTER — Encounter: Payer: Self-pay | Admitting: Neurology

## 2015-03-19 VITALS — BP 132/80 | HR 100 | Ht 64.0 in | Wt 211.0 lb

## 2015-03-19 DIAGNOSIS — R202 Paresthesia of skin: Principal | ICD-10-CM

## 2015-03-19 DIAGNOSIS — R29898 Other symptoms and signs involving the musculoskeletal system: Secondary | ICD-10-CM

## 2015-03-19 DIAGNOSIS — Z8661 Personal history of infections of the central nervous system: Secondary | ICD-10-CM | POA: Diagnosis not present

## 2015-03-19 DIAGNOSIS — R200 Anesthesia of skin: Secondary | ICD-10-CM

## 2015-03-19 NOTE — Telephone Encounter (Signed)
Gave order to front desk to schedule, Handed to Dawn she said she would let Dr Posey Pronto decide whether its a 90 minute or a 60 miinute

## 2015-03-19 NOTE — Telephone Encounter (Signed)
===  View-only below this line===  ----- Message -----    From: Pieter Partridge, DO    Sent: 03/19/2015  12:49 PM      To: Oda Kilts, CMA  Please contact patient to let her know I would like to schedule NCV-EMG to rule out neuropathy.

## 2015-03-19 NOTE — Patient Instructions (Signed)
At this point, I have no explanation for your symptoms.  However, I will let you know if performing a nerve conduction study test would be helpful.  I don't think it is related to any disc bulges and I do not suspect MS, which is good.

## 2015-03-19 NOTE — Progress Notes (Addendum)
NEUROLOGY FOLLOW UP OFFICE NOTE  Lorraine Conrad [id number]  HISTORY OF PRESENT ILLNESS: Lorraine Conrad is a 44 year old right-handed female with migraines, low back pain, proctalgia fugax, GERD, OSA on CPAP and history of meningitis who presents for back pain and lower extremity weakness.  History obtained by patient and hospital notes.  She is accompanied by her mother who provides some history.  Labs and MRI of the lumbar spine and brain were personally reviewed.  On 02/27/15, she developed heaviness in the legs.  She denied urinary or bowel incontinence or retention, however she reported burning on urination.  She also reported burning sensation over the posterior head down into the shoulders.  She also had shortness of breath and intermittent mild mid-sternal chest pain radiating into left arm.  The next day, she noted that her left leg seemed weak.  She noted low back pain but no radicular pain down the legs.    MRI with and without contrast demonstrated normal brain, cervical and thoracic spinal cord.  MRI of the lumbar spine showed minimal degenerative annular disc bulges at L4-5 and L5-S1 and mild bilateral facet arthrosis at L4-5, but no significant stenosis.  Labs included CRP 0.9, Sed Rate 23, negative Acetylcholine receptor antibodies, negative striated muscle antibodies, B12 236, negative heavy metals, B1 110.2, negative for HIV, normal CMP, and unremarkable CBC except for mildly elevated WBC of 12 which trended down to 10.  UA was negative.  Urine drug screen was positive for opioids.  Neurological exam was normal, with intact deep tendon reflexes and without weakness.   Workup for chest pain and shortness of breath were negative for PE, including CTA of chest, lower extremity venous doppler studies and d-dimer.  For the next several days, her walking had improved, although she is not at baseline.  Last night, she developed numbness in the face and arms.  She had a similar episode  in 2010.  At that time, she was worked up for MS at [facility].  Work-up was unremarkable, and MS was not suspected at that time.  Last year, she did have viral meningitis.  She denies rash, sicca.   PAST MEDICAL HISTORY: Past Medical History  Diagnosis Date  . GERD (gastroesophageal reflux disease)   . Endometriosis   . Ovarian cyst, left   . External hemorrhoids   . Erosive gastritis   . Proctalgia fugax   . Allergy     SEASONAL  . Anemia   . OSA on CPAP   . Migraine 1998; 02/2014    "this one's lasted 10 days straight" (03/01/2014)  . Headache     "probably monthly" (03/01/2014)  . Arthritis     "spine" (03/01/2014)  . Chronic lower back pain   . H/O shortness of breath 2014    Cardiopulmonary exercise test results    MEDICATIONS: Current Outpatient Prescriptions on File Prior to Visit  Medication Sig Dispense Refill  . acetaminophen (TYLENOL) 500 MG tablet Take 1 tablet (500 mg total) by mouth every 6 (six) hours as needed for mild pain or headache. For pain 30 tablet 0  . albuterol (PROVENTIL HFA;VENTOLIN HFA) 108 (90 BASE) MCG/ACT inhaler Inhale 2 puffs into the lungs every 6 (six) hours as needed for wheezing or shortness of breath. 1 Inhaler 0  . beclomethasone (QVAR) 40 MCG/ACT inhaler Inhale 2 puffs into the lungs 2 (two) times daily at 10 AM and 5 PM. 1 Inhaler 3  . cyclobenzaprine (FLEXERIL) 10 MG tablet Take  1 tablet (10 mg total) by mouth at bedtime. 5 tablet 0  . cyclobenzaprine (FLEXERIL) 10 MG tablet Take 1 tablet (10 mg total) by mouth at bedtime. 30 tablet 0  . diclofenac (VOLTAREN) 75 MG EC tablet Take 1 tablet (75 mg total) by mouth 2 (two) times daily. 30 tablet 1  . lansoprazole (PREVACID) 30 MG capsule Take 30 mg by mouth daily at 12 noon.    . loratadine (CLARITIN) 10 MG tablet Take 1 tablet (10 mg total) by mouth daily. 30 tablet 0  . mometasone (NASONEX) 50 MCG/ACT nasal spray Place 2 sprays into the nose daily.    . vitamin B-12 (CYANOCOBALAMIN) 100  MCG tablet Take 1 tablet (100 mcg total) by mouth daily. 30 tablet 3   No current facility-administered medications on file prior to visit.    ALLERGIES: Allergies  Allergen Reactions  . Promethazine Hcl Other (See Comments)    Violent tremors   . Cephalexin Diarrhea and Itching  . Codeine Other (See Comments)    Was a child when she took this.  . Dilaudid [Hydromorphone] Nausea And Vomiting  . Azithromycin Rash    Head to toe rash    FAMILY HISTORY: Family History  Problem Relation Age of Onset  . Breast cancer Maternal Grandmother   . Colon cancer Neg Hx   . Prostate cancer Paternal Grandfather   . Colon polyps Father   . Heart disease Father     SOCIAL HISTORY: Social History   Social History  . Marital Status: Married    Spouse Name: N/A  . Number of Children: N/A  . Years of Education: N/A   Occupational History  . Not on file.   Social History Main Topics  . Smoking status: Never Smoker   . Smokeless tobacco: Never Used  . Alcohol Use: 0.0 oz/week    0 Standard drinks or equivalent per week     Comment: 03/01/2014 "I'll have 1-2 drinks maybe 3-4 times/yr"  . Drug Use: No  . Sexual Activity: Yes   Other Topics Concern  . Not on file   Social History Narrative   Daily caffiene use: 6 cups per day   Patient does not get regular excercise    REVIEW OF SYSTEMS: Constitutional: No fevers, chills, or sweats, no generalized fatigue, change in appetite Eyes: No visual changes, double vision, eye pain Ear, nose and throat: No hearing loss, ear pain, nasal congestion, sore throat Cardiovascular: No chest pain, palpitations Respiratory:  No shortness of breath at rest or with exertion, wheezes GastrointestinaI: No nausea, vomiting, diarrhea, abdominal pain, fecal incontinence Genitourinary:  No dysuria, urinary retention or frequency Musculoskeletal:  No neck pain, back pain Integumentary: No rash, pruritus, skin lesions Neurological: as  above Psychiatric: No depression, insomnia, anxiety Endocrine: No palpitations, fatigue, diaphoresis, mood swings, change in appetite, change in weight, increased thirst Hematologic/Lymphatic:  No anemia, purpura, petechiae. Allergic/Immunologic: no itchy/runny eyes, nasal congestion, recent allergic reactions, rashes  PHYSICAL EXAM: Filed Vitals:   03/19/15 0806  BP: 132/80  Pulse: 100   General: No acute distress.  Patient appears well-groomed.   Head:  Normocephalic/atraumatic Eyes:  Fundoscopic exam unremarkable without vessel changes, exudates, hemorrhages or papilledema. Neck: supple, no paraspinal tenderness, full range of motion Heart:  Regular rate and rhythm Lungs:  Clear to auscultation bilaterally Back: No paraspinal tenderness Neurological Exam: alert and oriented to person, place, and time. Attention span and concentration intact, recent and remote memory intact, fund of knowledge intact.  Speech  fluent and not dysarthric, language intact.  CN II-XII intact. Fundoscopic exam unremarkable without vessel changes, exudates, hemorrhages or papilledema.  Bulk and tone normal, muscle strength 5/5 throughout.  Sensation to pinprick reduced in left foot.  Vibration sensation intact.  Deep tendon reflexes 3+ and symmetric in patellars, otherwise 2+ throughout, toes downgoing.  Finger to nose and heel to shin testing intact.  Gait cautious, wide based and with short strides, able to walk on toes and heels, able to tandem walk, Romberg negative.  IMPRESSION: Weakness in the legs Numbness in the leg and arms History of viral meningitis  Diagnosis is unclear.  It is not MS and I don't believe it is related to any radiculopathy related to disc bulges.  It is unlikely to be meningitis as this is a recurrent episode.  Guillain-Barre Syndrome is a possibility, given the weakness, shortness of breath and some numbness, but again, this would be unusual given that this is a recurrent episode, and  she has intact (even brisk) reflexes.  PLAN: We will check NCV-EMG to look for evidence of demyelinating neuropathy Further management pending results.  If unremarkable, I really do not have an explanation for her symptoms.  Metta Clines, DO  CC:  Mackie Pai, PA-C

## 2015-04-03 ENCOUNTER — Ambulatory Visit (INDEPENDENT_AMBULATORY_CARE_PROVIDER_SITE_OTHER): Payer: BLUE CROSS/BLUE SHIELD | Admitting: Neurology

## 2015-04-03 ENCOUNTER — Telehealth: Payer: Self-pay

## 2015-04-03 DIAGNOSIS — R200 Anesthesia of skin: Secondary | ICD-10-CM

## 2015-04-03 DIAGNOSIS — R202 Paresthesia of skin: Secondary | ICD-10-CM | POA: Diagnosis not present

## 2015-04-03 NOTE — Telephone Encounter (Signed)
Attempted to call patient. VM not set up. Will attempt to reach pt at later time.

## 2015-04-03 NOTE — Procedures (Signed)
[facility]  [address]  [place], [place] [address] Tel: [phone] Fax:  [phone] Test Date:  04/03/2015  Patient: Lorraine Conrad DOB: 1971-03-22 Physician: Narda Amber  Sex: Female Height: 5\' 4"  Ref Phys: Metta Clines, M.D.  ID#: [id number] Temp: 32.6C Technician: Jerilynn Mages. Dean   Patient Complaints: This is a 44 year old female referred for evaluation of generalized weakness and paresthesias, worse on the left.  NCV & EMG Findings: Extensive electrodiagnostic testing of the left upper and lower extremity shows: 1. All sensory responses including the median, ulnar, palmar, sural and superficial sensory nerves are within normal limits. 2. All motor responses including the median, ulnar, tibial, and peroneal nerves are within normal limits. 3. Left ulnar F-wave and tibial H reflex study is within normal limits. 4. There is no evidence of active or chronic motor axon loss changes affecting any of the tested muscles. Motor unit configuration and recruitment pattern is within normal limits.  Impression: This is a normal study of the left upper and lower extremities.   In particular, there is no evidence of a generalized sensorimotor polyneuropathy, cervical/lumbosacral radiculopathy, diffuse myopathy, or carpal tunnel syndrome.   _____________________________ Narda Amber, D.O.    Nerve Conduction Studies Anti Sensory Summary Table   Stim Site NR Peak (ms) Norm Peak (ms) P-T Amp (V) Norm P-T Amp  Left Median Anti Sensory (2nd Digit)  32.6C  Wrist    3.3 <3.4 35.1 >20  Left Sup Peroneal Anti Sensory (Ant Lat Mall)  32.6C  12 cm    2.3 <4.5 10.7 >5  Left Sural Anti Sensory (Lat Mall)  32.6C  Calf    3.6 <4.5 12.8 >5  Left Ulnar Anti Sensory (5th Digit)  32.6C  Wrist    2.7 <3.1 33.8 >12   Motor Summary Table   Stim Site NR Onset (ms) Norm Onset (ms) O-P Amp (mV) Norm O-P Amp Site1 Site2 Delta-0 (ms) Dist (cm) Vel (m/s) Norm Vel (m/s)  Left Median  Motor (Abd Poll Brev)  32.6C  Wrist    3.4 <3.9 9.3 >6 Elbow Wrist 3.4 19.0 56 >50  Elbow    6.8  9.1         Left Peroneal Motor (Ext Dig Brev)  32.6C  Ankle    3.6 <5.5 3.9 >3 B Fib Ankle 6.3 32.0 51 >40  B Fib    9.9  3.9  Poplt B Fib 1.5 10.0 67 >40  Poplt    11.4  3.5         Left Tibial Motor (Abd Hall Brev)  32.6C  Ankle    3.6 <6.0 15.8 >8 Knee Ankle 6.8 38.0 56 >40  Knee    10.4  9.7         Left Ulnar Motor (Abd Dig Minimi)  32.6C  Wrist    2.3 <3.1 8.0 >7 B Elbow Wrist 2.8 17.0 61 >50  B Elbow    5.1  7.7  A Elbow B Elbow 1.3 10.0 77 >50  A Elbow    6.4  7.4          Comparison Summary Table   Stim Site NR Peak (ms) Norm Peak (ms) P-T Amp (V) Site1 Site2 Delta-P (ms) Norm Delta (ms)  Left Median/Ulnar Palm Comparison (Wrist - 8cm)  32.6C  Median Palm    1.7 <2.2 117.7 Median Palm Ulnar Palm 0.4   Ulnar Palm    1.3 <2.2 28.7       F  Wave Studies   NR F-Lat (ms) Lat Norm (ms) L-R F-Lat (ms)  Left Ulnar (Mrkrs) (Abd Dig Min)  32.6C     24.85 <33    H Reflex Studies   NR H-Lat (ms) Lat Norm (ms) L-R H-Lat (ms)  Left Tibial (Gastroc)  32.6C     32.65 <35    EMG   Side Muscle Ins Act Fibs Psw Fasc Number Recrt Dur Dur. Amp Amp. Poly Poly. Comment  Left 1stDorInt Nml Nml Nml Nml Nml Nml Nml Nml Nml Nml Nml Nml N/A  Left Ext Indicis Nml Nml Nml Nml Nml Nml Nml Nml Nml Nml Nml Nml N/A  Left PronatorTeres Nml Nml Nml Nml Nml Nml Nml Nml Nml Nml Nml Nml N/A  Left Biceps Nml Nml Nml Nml Nml Nml Nml Nml Nml Nml Nml Nml N/A  Left Triceps Nml Nml Nml Nml Nml Nml Nml Nml Nml Nml Nml Nml N/A  Left Deltoid Nml Nml Nml Nml Nml Nml Nml Nml Nml Nml Nml Nml N/A  Left Cervical Parasp Low Nml Nml Nml Nml Nml Nml Nml Nml Nml Nml Nml Nml N/A  Left Lumbo Parasp Low Nml Nml Nml Nml Nml Nml Nml Nml Nml Nml Nml Nml N/A  Left GluteusMed Nml Nml Nml Nml Nml Nml Nml Nml Nml Nml Nml Nml N/A  Left BicepsFemS Nml Nml Nml Nml Nml Nml Nml Nml Nml Nml Nml Nml N/A  Left RectFemoris Nml Nml  Nml Nml Nml Nml Nml Nml Nml Nml Nml Nml N/A  Left AntTibialis Nml Nml Nml Nml Nml Nml Nml Nml Nml Nml Nml Nml N/A  Left Gastroc Nml Nml Nml Nml Nml Nml Nml Nml Nml Nml Nml Nml N/A  Left Flex Dig Long Nml Nml Nml Nml Nml Nml Nml Nml Nml Nml Nml Nml N/A      Waveforms:

## 2015-04-03 NOTE — Telephone Encounter (Signed)
Opened in error

## 2015-04-03 NOTE — Telephone Encounter (Signed)
-----   Message from Pieter Partridge, DO sent at 04/03/2015 10:07 AM EDT ----- Nerve study was normal.  At this point, I do not have an explanation for her symptoms.

## 2015-04-04 NOTE — Telephone Encounter (Signed)
Spoke with patient. Who stated that during her EEG she was unable to feel in her lower left extremity, which she was told was the most sensitive are. Dr. Tomi Likens over heard our conversation and stated that he has no further testing to be done, and is unsure what is causing her sensations, or lack thereof. Patient was notified, and encouraged to refer back to her PCP for other possible etiologies.

## 2015-04-12 ENCOUNTER — Emergency Department (HOSPITAL_COMMUNITY): Payer: No Typology Code available for payment source

## 2015-04-12 ENCOUNTER — Emergency Department (HOSPITAL_COMMUNITY)
Admission: EM | Admit: 2015-04-12 | Discharge: 2015-04-12 | Disposition: A | Discharge status: Home / Self Care | Payer: No Typology Code available for payment source | Attending: Emergency Medicine | Admitting: Emergency Medicine

## 2015-04-12 ENCOUNTER — Encounter (HOSPITAL_COMMUNITY): Payer: Self-pay | Admitting: Emergency Medicine

## 2015-04-12 DIAGNOSIS — Z8742 Personal history of other diseases of the female genital tract: Secondary | ICD-10-CM | POA: Diagnosis not present

## 2015-04-12 DIAGNOSIS — S8992XA Unspecified injury of left lower leg, initial encounter: Secondary | ICD-10-CM | POA: Diagnosis not present

## 2015-04-12 DIAGNOSIS — G8929 Other chronic pain: Secondary | ICD-10-CM | POA: Insufficient documentation

## 2015-04-12 DIAGNOSIS — S99912A Unspecified injury of left ankle, initial encounter: Secondary | ICD-10-CM | POA: Diagnosis not present

## 2015-04-12 DIAGNOSIS — R519 Headache, unspecified: Secondary | ICD-10-CM

## 2015-04-12 DIAGNOSIS — M25562 Pain in left knee: Secondary | ICD-10-CM

## 2015-04-12 DIAGNOSIS — Y998 Other external cause status: Secondary | ICD-10-CM | POA: Diagnosis not present

## 2015-04-12 DIAGNOSIS — S0990XA Unspecified injury of head, initial encounter: Secondary | ICD-10-CM | POA: Insufficient documentation

## 2015-04-12 DIAGNOSIS — K2900 Acute gastritis without bleeding: Secondary | ICD-10-CM | POA: Insufficient documentation

## 2015-04-12 DIAGNOSIS — Z791 Long term (current) use of non-steroidal anti-inflammatories (NSAID): Secondary | ICD-10-CM | POA: Diagnosis not present

## 2015-04-12 DIAGNOSIS — G43909 Migraine, unspecified, not intractable, without status migrainosus: Secondary | ICD-10-CM | POA: Diagnosis not present

## 2015-04-12 DIAGNOSIS — S199XXA Unspecified injury of neck, initial encounter: Secondary | ICD-10-CM | POA: Diagnosis not present

## 2015-04-12 DIAGNOSIS — G4733 Obstructive sleep apnea (adult) (pediatric): Secondary | ICD-10-CM | POA: Insufficient documentation

## 2015-04-12 DIAGNOSIS — Y92410 Unspecified street and highway as the place of occurrence of the external cause: Secondary | ICD-10-CM | POA: Insufficient documentation

## 2015-04-12 DIAGNOSIS — K219 Gastro-esophageal reflux disease without esophagitis: Secondary | ICD-10-CM | POA: Diagnosis not present

## 2015-04-12 DIAGNOSIS — Y9389 Activity, other specified: Secondary | ICD-10-CM | POA: Insufficient documentation

## 2015-04-12 DIAGNOSIS — M1990 Unspecified osteoarthritis, unspecified site: Secondary | ICD-10-CM | POA: Diagnosis not present

## 2015-04-12 DIAGNOSIS — S3992XA Unspecified injury of lower back, initial encounter: Secondary | ICD-10-CM | POA: Diagnosis present

## 2015-04-12 DIAGNOSIS — Z7951 Long term (current) use of inhaled steroids: Secondary | ICD-10-CM | POA: Diagnosis not present

## 2015-04-12 DIAGNOSIS — Z79899 Other long term (current) drug therapy: Secondary | ICD-10-CM | POA: Diagnosis not present

## 2015-04-12 DIAGNOSIS — R51 Headache: Secondary | ICD-10-CM

## 2015-04-12 DIAGNOSIS — M542 Cervicalgia: Secondary | ICD-10-CM

## 2015-04-12 DIAGNOSIS — M25572 Pain in left ankle and joints of left foot: Secondary | ICD-10-CM

## 2015-04-12 DIAGNOSIS — D649 Anemia, unspecified: Secondary | ICD-10-CM | POA: Diagnosis not present

## 2015-04-12 DIAGNOSIS — Z9981 Dependence on supplemental oxygen: Secondary | ICD-10-CM | POA: Insufficient documentation

## 2015-04-12 MED ORDER — METHOCARBAMOL 500 MG PO TABS: 500.0000 mg | ORAL_TABLET | Freq: Two times a day (BID) | ORAL | Status: DC

## 2015-04-12 MED ORDER — ACETAMINOPHEN 325 MG PO TABS
650.0000 mg | ORAL_TABLET | Freq: Once | ORAL | Status: AC
  Administered 2015-04-12: 650 mg via ORAL
  Filled 2015-04-12: qty 2

## 2015-04-12 MED ORDER — OXYCODONE-ACETAMINOPHEN 5-325 MG PO TABS: 1.0000 | ORAL_TABLET | Freq: Four times a day (QID) | ORAL | Status: DC | PRN

## 2015-04-12 MED ORDER — KETOROLAC TROMETHAMINE 60 MG/2ML IM SOLN
60.0000 mg | Freq: Once | INTRAMUSCULAR | Status: AC
  Administered 2015-04-12: 60 mg via INTRAMUSCULAR
  Filled 2015-04-12: qty 2
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
## 2015-04-19 ENCOUNTER — Ambulatory Visit (INDEPENDENT_AMBULATORY_CARE_PROVIDER_SITE_OTHER): Payer: BLUE CROSS/BLUE SHIELD | Admitting: Medical

## 2015-04-19 ENCOUNTER — Ambulatory Visit (HOSPITAL_BASED_OUTPATIENT_CLINIC_OR_DEPARTMENT_OTHER)
Admission: RE | Admit: 2015-04-19 | Discharge: 2015-04-19 | Disposition: A | Discharge status: Home / Self Care | Payer: BLUE CROSS/BLUE SHIELD | Source: Ambulatory Visit | Attending: Medical | Admitting: Medical

## 2015-04-19 ENCOUNTER — Encounter: Payer: Self-pay | Admitting: Medical

## 2015-04-19 ENCOUNTER — Other Ambulatory Visit: Payer: Self-pay | Admitting: Medical

## 2015-04-19 VITALS — BP 122/80 | HR 88 | Temp 98.1°F | Ht 64.0 in | Wt 211.0 lb

## 2015-04-19 DIAGNOSIS — M25562 Pain in left knee: Secondary | ICD-10-CM | POA: Diagnosis not present

## 2015-04-19 DIAGNOSIS — M25572 Pain in left ankle and joints of left foot: Secondary | ICD-10-CM | POA: Diagnosis not present

## 2015-04-19 DIAGNOSIS — M25552 Pain in left hip: Secondary | ICD-10-CM | POA: Insufficient documentation

## 2015-04-19 DIAGNOSIS — M79672 Pain in left foot: Secondary | ICD-10-CM | POA: Insufficient documentation

## 2015-04-19 DIAGNOSIS — S46811D Strain of other muscles, fascia and tendons at shoulder and upper arm level, right arm, subsequent encounter: Secondary | ICD-10-CM

## 2015-04-19 MED ORDER — DICLOFENAC SODIUM 75 MG PO TBEC: 75.0000 mg | DELAYED_RELEASE_TABLET | Freq: Two times a day (BID) | ORAL | Status: DC

## 2015-04-19 MED ORDER — METHOCARBAMOL 500 MG PO TABS: ORAL_TABLET | ORAL | Status: DC

## 2015-04-19 NOTE — Progress Notes (Signed)
Pre visit review using our clinic review tool, if applicable. No additional management support is needed unless otherwise documented below in the visit note. 
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
## 2015-04-19 NOTE — Patient Instructions (Addendum)
For trapezius strain will rx robaxin. Warm compress to rt trapezius or massage of  trapezius may help.  Lt knee pain. Has contusion/bruise area(xray was negative). If knee pain persists by another 10 days or so would consider orthopedist but I think now too early.  For left foot pain will get xray of the foot. You have small bruise under great toenail. If your nail begins to loosen then you may need nail removed.  Will get left hip xray today.    Your back pain is mild faint now. Will follow. Expect this to resolve.  Rx of robaxin for muscle spasms and diclofenac for pain/inflammation

## 2015-05-10 ENCOUNTER — Other Ambulatory Visit: Payer: Self-pay | Admitting: Medical

## 2015-05-16 ENCOUNTER — Telehealth: Payer: Self-pay | Admitting: Medical

## 2015-05-16 ENCOUNTER — Encounter: Payer: Self-pay | Admitting: Medical

## 2015-05-16 ENCOUNTER — Ambulatory Visit (INDEPENDENT_AMBULATORY_CARE_PROVIDER_SITE_OTHER): Payer: BLUE CROSS/BLUE SHIELD | Admitting: Medical

## 2015-05-16 ENCOUNTER — Ambulatory Visit (HOSPITAL_BASED_OUTPATIENT_CLINIC_OR_DEPARTMENT_OTHER)
Admission: RE | Admit: 2015-05-16 | Discharge: 2015-05-16 | Disposition: A | Discharge status: Home / Self Care | Payer: BLUE CROSS/BLUE SHIELD | Source: Ambulatory Visit | Attending: Medical | Admitting: Medical

## 2015-05-16 VITALS — BP 118/78 | HR 71 | Temp 98.1°F | Ht 64.0 in | Wt 211.4 lb

## 2015-05-16 DIAGNOSIS — M25552 Pain in left hip: Secondary | ICD-10-CM | POA: Diagnosis not present

## 2015-05-16 DIAGNOSIS — R0789 Other chest pain: Secondary | ICD-10-CM | POA: Insufficient documentation

## 2015-05-16 DIAGNOSIS — M25572 Pain in left ankle and joints of left foot: Secondary | ICD-10-CM

## 2015-05-16 DIAGNOSIS — S46811D Strain of other muscles, fascia and tendons at shoulder and upper arm level, right arm, subsequent encounter: Secondary | ICD-10-CM

## 2015-05-16 DIAGNOSIS — R1013 Epigastric pain: Secondary | ICD-10-CM

## 2015-05-16 DIAGNOSIS — M25562 Pain in left knee: Secondary | ICD-10-CM

## 2015-05-16 LAB — COMPREHENSIVE METABOLIC PANEL
ALBUMIN: 4.1 g/dL (ref 3.5–5.2)
ALT: 12 U/L (ref 0–35)
AST: 13 U/L (ref 0–37)
Alkaline Phosphatase: 88 U/L (ref 39–117)
BUN: 11 mg/dL (ref 6–23)
CHLORIDE: 104 meq/L (ref 96–112)
CO2: 27 meq/L (ref 19–32)
CREATININE: 0.85 mg/dL (ref 0.40–1.20)
Calcium: 9.3 mg/dL (ref 8.4–10.5)
GFR: 77.65 mL/min (ref >60.00)
Glucose, Bld: 89 mg/dL (ref 70–99)
POTASSIUM: 4.1 meq/L (ref 3.5–5.1)
SODIUM: 139 meq/L (ref 135–145)
Total Bilirubin: 0.4 mg/dL (ref 0.2–1.2)
Total Protein: 7.5 g/dL (ref 6.0–8.3)

## 2015-05-16 LAB — CBC WITH DIFFERENTIAL/PLATELET
Basophils Absolute: 0 10*3/uL (ref 0.0–0.1)
Basophils Relative: 0.5 % (ref 0.0–3.0)
EOS ABS: 0.2 10*3/uL (ref 0.0–0.7)
Eosinophils Relative: 2.2 % (ref 0.0–5.0)
HCT: 42.3 % (ref 36.0–46.0)
HEMOGLOBIN: 14 g/dL (ref 12.0–15.0)
Lymphocytes Relative: 23.8 % (ref 12.0–46.0)
Lymphs Abs: 2.3 10*3/uL (ref 0.7–4.0)
MCHC: 33.1 g/dL (ref 30.0–36.0)
MCV: 85 fl (ref 78.0–100.0)
MONO ABS: 0.6 10*3/uL (ref 0.1–1.0)
Monocytes Relative: 5.9 % (ref 3.0–12.0)
Neutro Abs: 6.5 10*3/uL (ref 1.4–7.7)
Neutrophils Relative %: 67.6 % (ref 43.0–77.0)
PLATELETS: 451 10*3/uL — AB (ref 150.0–400.0)
RBC: 4.98 Mil/uL (ref 3.87–5.11)
RDW: 13.6 % (ref 11.5–15.5)
WBC: 9.6 10*3/uL (ref 4.0–10.5)

## 2015-05-16 LAB — AMYLASE: AMYLASE: 35 U/L (ref 27–131)

## 2015-05-16 LAB — TROPONIN I: TNIDX: 0 ug/l (ref 0.00–0.06)

## 2015-05-16 LAB — LIPASE: LIPASE: 19 U/L (ref 11.0–59.0)

## 2015-05-16 MED ORDER — RANITIDINE HCL 150 MG PO CAPS: 150.0000 mg | ORAL_CAPSULE | Freq: Two times a day (BID) | ORAL | Status: DC

## 2015-05-16 MED ORDER — DICLOFENAC SODIUM 75 MG PO TBEC: 75.0000 mg | DELAYED_RELEASE_TABLET | Freq: Two times a day (BID) | ORAL | Status: DC

## 2015-05-16 MED ORDER — DULOXETINE HCL 30 MG PO CPEP: 30.0000 mg | ORAL_CAPSULE | Freq: Every day | ORAL | Status: DC
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
## 2015-05-16 NOTE — Progress Notes (Signed)
Pre visit review using our clinic review tool, if applicable. No additional management support is needed unless otherwise documented below in the visit note. 

## 2015-05-16 NOTE — Addendum Note (Signed)
Addended by: Anabel Halon on: 05/16/2015 12:32 PM   Modules accepted: Miquel Dunn

## 2015-05-16 NOTE — Telephone Encounter (Signed)
Patient is scheduled for 05/18/15

## 2015-05-16 NOTE — Telephone Encounter (Signed)
Will your arrange pt abd [place] for this week.

## 2015-05-18 ENCOUNTER — Other Ambulatory Visit: Payer: Self-pay

## 2015-05-18 ENCOUNTER — Ambulatory Visit (HOSPITAL_BASED_OUTPATIENT_CLINIC_OR_DEPARTMENT_OTHER)
Admission: RE | Admit: 2015-05-18 | Discharge: 2015-05-18 | Disposition: A | Discharge status: Home / Self Care | Payer: BLUE CROSS/BLUE SHIELD | Source: Ambulatory Visit | Attending: Medical | Admitting: Medical

## 2015-05-18 ENCOUNTER — Telehealth: Payer: Self-pay | Admitting: Medical

## 2015-05-18 DIAGNOSIS — R0789 Other chest pain: Secondary | ICD-10-CM | POA: Insufficient documentation

## 2015-05-18 DIAGNOSIS — R1013 Epigastric pain: Secondary | ICD-10-CM | POA: Diagnosis not present

## 2015-05-18 MED ORDER — DULOXETINE HCL 30 MG PO CPEP
30.0000 mg | ORAL_CAPSULE | Freq: Every day | ORAL | Status: DC
Start: 2015-05-18 — End: 2015-08-14

## 2015-05-18 MED ORDER — BECLOMETHASONE DIPROPIONATE 40 MCG/ACT IN AERS: 2.0000 | INHALATION_SPRAY | Freq: Two times a day (BID) | RESPIRATORY_TRACT | Status: DC

## 2015-05-18 MED ORDER — METHOCARBAMOL 500 MG PO TABS: ORAL_TABLET | ORAL | Status: DC

## 2015-05-18 MED ORDER — RANITIDINE HCL 150 MG PO CAPS: 150.0000 mg | ORAL_CAPSULE | Freq: Two times a day (BID) | ORAL | Status: DC

## 2015-05-18 MED ORDER — CYCLOBENZAPRINE HCL 10 MG PO TABS: 10.0000 mg | ORAL_TABLET | Freq: Every day | ORAL | Status: DC

## 2015-05-18 MED ORDER — ALBUTEROL SULFATE HFA 108 (90 BASE) MCG/ACT IN AERS: 2.0000 | INHALATION_SPRAY | Freq: Four times a day (QID) | RESPIRATORY_TRACT | Status: DC | PRN

## 2015-05-18 MED ORDER — MOMETASONE FUROATE 50 MCG/ACT NA SUSP: NASAL | Status: DC

## 2015-05-18 MED ORDER — DICLOFENAC SODIUM 75 MG PO TBEC: 75.0000 mg | DELAYED_RELEASE_TABLET | Freq: Two times a day (BID) | ORAL | Status: DC

## 2015-05-18 NOTE — Telephone Encounter (Signed)
Relation to [id number] Call back number: [phone]  Pharmacy: [facility]   Reason for call:   Patient requesting a 90 day supply refill  cyclobenzaprine (FLEXERIL) 10 MG tablet  diclofenac (VOLTAREN) 75 MG EC tablet  DULoxetine (CYMBALTA) 30 MG capsule albuterol (PROVENTIL HFA;VENTOLIN HFA) 108 (90 BASE) MCG/ACT inhaler  beclomethasone (QVAR) 40 MCG/ACT inhaler  mometasone (NASONEX) 50 MCG/ACT nasal spray  ranitidine (ZANTAC) 150 MG capsule  methocarbamol (ROBAXIN) 500 MG tablet

## 2015-05-21 ENCOUNTER — Encounter: Payer: Self-pay | Admitting: Family Medicine

## 2015-05-21 ENCOUNTER — Ambulatory Visit (INDEPENDENT_AMBULATORY_CARE_PROVIDER_SITE_OTHER): Payer: Self-pay | Admitting: Family Medicine

## 2015-05-21 VITALS — BP 137/97 | HR 91 | Ht 64.0 in | Wt 211.0 lb

## 2015-05-21 DIAGNOSIS — M25552 Pain in left hip: Secondary | ICD-10-CM

## 2015-05-21 DIAGNOSIS — M25562 Pain in left knee: Secondary | ICD-10-CM

## 2015-05-21 DIAGNOSIS — M25572 Pain in left ankle and joints of left foot: Secondary | ICD-10-CM

## 2015-05-21 NOTE — Telephone Encounter (Signed)
Edward please advise if ok to send 90 day supply to pharmacy.

## 2015-05-21 NOTE — Patient Instructions (Signed)
You strained your IT band at the hip, posterior tibialis and extensor tendons of the ankle.  You also have a knee contusion. Avoid painful activities as much as possible. Ice over area of pain 3-4 times a day for 15 minutes at a time. Start physical therapy and do home exercises on days you don't go to therapy. Aleve 2 tabs twice a day with food for pain and inflammation. Use the boot you have at home with long walking and prolonged standing. Follow up with me in 1 month to 6 weeks for reevaluation.

## 2015-05-22 NOTE — Telephone Encounter (Signed)
Pt has a request to refill a lot of her meds through mail order. I just wrote cymbalta for 30 mg. Was planning on increasing that to 60 mg a day if 30 mg is helping and she is not having side effects. Will see how she does on follow up. Her diclofenac is nsaid. I don't want hr to take it chronically. I could rx #60. I would want her to make that last full 3 months if not longer. Due to nsaids effects on kidneys. For cyclobenzaprine 10 mg. I could  rx  #45 tabs. 1 tab po q hs as needed muscle spasms(I don't want to her to overuse). If I rx cyclobenzaprine she does not need rx of robaxan. You could send in ranitidine 150 mg #180 1 tab po bid. Regarding her albuterol, qvar and nasonex.(could you get [facility] to give fill 3 quantity which would be approximate 3 months.)

## 2015-05-23 MED ORDER — MOMETASONE FUROATE 50 MCG/ACT NA SUSP: NASAL | Status: DC

## 2015-05-23 MED ORDER — RANITIDINE HCL 150 MG PO CAPS: 150.0000 mg | ORAL_CAPSULE | Freq: Two times a day (BID) | ORAL | Status: DC

## 2015-05-23 MED ORDER — BECLOMETHASONE DIPROPIONATE 40 MCG/ACT IN AERS: 2.0000 | INHALATION_SPRAY | Freq: Two times a day (BID) | RESPIRATORY_TRACT | Status: DC

## 2015-05-23 MED ORDER — ALBUTEROL SULFATE HFA 108 (90 BASE) MCG/ACT IN AERS: 2.0000 | INHALATION_SPRAY | Freq: Four times a day (QID) | RESPIRATORY_TRACT | Status: DC | PRN

## 2015-05-23 MED ORDER — CYCLOBENZAPRINE HCL 10 MG PO TABS: 10.0000 mg | ORAL_TABLET | Freq: Every day | ORAL | Status: DC

## 2015-05-23 NOTE — Telephone Encounter (Signed)
Rx's sent to pharmacy.  

## 2015-05-24 DIAGNOSIS — M25572 Pain in left ankle and joints of left foot: Secondary | ICD-10-CM | POA: Insufficient documentation

## 2015-05-24 DIAGNOSIS — M25562 Pain in left knee: Secondary | ICD-10-CM | POA: Insufficient documentation

## 2015-05-24 DIAGNOSIS — M25552 Pain in left hip: Secondary | ICD-10-CM | POA: Insufficient documentation

## 2015-05-24 NOTE — Assessment & Plan Note (Signed)
2/2 contusion.  Icing, physical therapy, aleve.
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
## 2015-05-24 NOTE — Assessment & Plan Note (Signed)
2/2 posterior tibialis and extensor tendon strains.  Start physical therapy and home exercises.  Cam walker with prolonged standing and walking.  Icing, aleve.  F/u in 1 month to 6 weeks.

## 2015-05-24 NOTE — Assessment & Plan Note (Signed)
2/2 IT band strain.  Start physical therapy.  Icing, aleve.  F/u in 1 month to 6 weeks.

## 2015-05-28 ENCOUNTER — Ambulatory Visit: Payer: BLUE CROSS/BLUE SHIELD

## 2015-06-06 ENCOUNTER — Telehealth: Payer: Self-pay | Admitting: Family Medicine

## 2015-06-06 NOTE — Telephone Encounter (Signed)
It's ok for her to come by to get a boot.

## 2015-06-07 NOTE — Telephone Encounter (Signed)
Patient came in today for boot.

## 2015-06-13 ENCOUNTER — Ambulatory Visit: Payer: BLUE CROSS/BLUE SHIELD | Admitting: Medical

## 2015-06-13 ENCOUNTER — Ambulatory Visit: Payer: BLUE CROSS/BLUE SHIELD | Attending: Family Medicine

## 2015-06-13 DIAGNOSIS — R262 Difficulty in walking, not elsewhere classified: Secondary | ICD-10-CM | POA: Diagnosis present

## 2015-06-13 DIAGNOSIS — M25562 Pain in left knee: Secondary | ICD-10-CM | POA: Diagnosis present

## 2015-06-13 DIAGNOSIS — M25572 Pain in left ankle and joints of left foot: Secondary | ICD-10-CM | POA: Diagnosis present

## 2015-06-13 DIAGNOSIS — M25552 Pain in left hip: Secondary | ICD-10-CM | POA: Diagnosis not present

## 2015-06-13 DIAGNOSIS — R29898 Other symptoms and signs involving the musculoskeletal system: Secondary | ICD-10-CM | POA: Diagnosis present
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
## 2015-06-13 NOTE — Patient Instructions (Signed)
Leg supported to do all ankle exercises:    Ankle Circles   Slowly rotate right foot and ankle clockwise then counterclockwise. Gradually increase range of motion. Avoid pain. Circle _10___ times each direction per set. Do _1___ sets per session. Do _4-5___ sessions per day.  http://orth.exer.us/30   Copyright  VHI. All rights reserved.  Long Sitting Bend   In long sitting position, bend and straighten toes. Then bend ankle, moving foot. Then bend and straighten knee. Do the moves to one leg and then the other. Repeat 10____ times. Do 4-5____ sessions per day.  http://gt2.exer.us/647   Copyright  VHI. All rights reserved.  Ankle Bend: Dorsiflexion / Plantar Flexion, Sitting  Copyright  VHI. All rights reserved.  Ankle Sideward Movement (Inversion / Eversion)   Sitting or lying with feet on floor, keep knee still and rock foot onto outer edge. Return to resting position. Now rock foot onto inner edge. Repeat with other foot, then with both feet. Repeat _10___ times. Do __4-5__ sessions per day.  Knee to Chest    Lying supine, bend involved knee to chest hold 20 seconds, repeat _3__ times. Repeat with other leg. Do _3__ times per day.  Copyright  VHI. All rights reserved.   HIP: Hamstrings - Short Sitting    Rest leg on raised surface. Keep knee straight. Lift chest. Hold _20__ seconds. __3_ reps per set, __3_ sets per day Copyright  VHI. All rights reserved.   KNEE: Extension, Long Arc Quads - Sitting    Raise leg until knee is straight.  Hold 5 seconds. _10__ reps per set, _3__ sets per day    [address] [facility] [address] [place], [place] [address] Phone # [phone] Fax [phone]

## 2015-06-14 ENCOUNTER — Encounter: Payer: Self-pay | Admitting: Medical

## 2015-06-14 ENCOUNTER — Ambulatory Visit (INDEPENDENT_AMBULATORY_CARE_PROVIDER_SITE_OTHER): Payer: BLUE CROSS/BLUE SHIELD | Admitting: Medical

## 2015-06-14 VITALS — BP 124/80 | HR 87 | Temp 98.1°F | Ht 64.0 in | Wt 209.0 lb

## 2015-06-14 DIAGNOSIS — M25562 Pain in left knee: Secondary | ICD-10-CM

## 2015-06-14 DIAGNOSIS — R1013 Epigastric pain: Secondary | ICD-10-CM | POA: Diagnosis not present

## 2015-06-14 DIAGNOSIS — M25572 Pain in left ankle and joints of left foot: Secondary | ICD-10-CM | POA: Diagnosis not present

## 2015-06-14 DIAGNOSIS — M25552 Pain in left hip: Secondary | ICD-10-CM | POA: Diagnosis not present

## 2015-06-14 DIAGNOSIS — M940 Chondrocostal junction syndrome [Tietze]: Secondary | ICD-10-CM
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
## 2015-06-14 NOTE — Progress Notes (Signed)
Pre visit review using our clinic review tool, if applicable. No additional management support is needed unless otherwise documented below in the visit note. 
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
## 2015-06-15 LAB — H. PYLORI BREATH TEST: H. PYLORI BREATH TEST: NOT DETECTED

## 2015-06-19 DIAGNOSIS — Z9889 Other specified postprocedural states: Secondary | ICD-10-CM | POA: Insufficient documentation

## 2015-06-26 ENCOUNTER — Ambulatory Visit: Payer: BLUE CROSS/BLUE SHIELD | Admitting: Family Medicine

## 2015-07-03 ENCOUNTER — Ambulatory Visit: Payer: BLUE CROSS/BLUE SHIELD | Admitting: Family Medicine

## 2015-07-10 ENCOUNTER — Ambulatory Visit (INDEPENDENT_AMBULATORY_CARE_PROVIDER_SITE_OTHER): Payer: BLUE CROSS/BLUE SHIELD | Admitting: Family Medicine

## 2015-07-10 ENCOUNTER — Encounter: Payer: Self-pay | Admitting: Family Medicine

## 2015-07-10 ENCOUNTER — Telehealth: Payer: Self-pay | Admitting: Medical

## 2015-07-10 VITALS — BP 137/87 | HR 89 | Ht 64.0 in | Wt 211.0 lb

## 2015-07-10 DIAGNOSIS — M25562 Pain in left knee: Secondary | ICD-10-CM

## 2015-07-10 DIAGNOSIS — M25552 Pain in left hip: Secondary | ICD-10-CM

## 2015-07-10 DIAGNOSIS — M25572 Pain in left ankle and joints of left foot: Secondary | ICD-10-CM

## 2015-07-10 NOTE — Telephone Encounter (Signed)
elation to [id number] Call back [place], [place] [phone] (Phone) [phone] (Fax)        Reason for call:  Patient requesting a refill diclofenac (VOLTAREN) 75 MG EC tablet

## 2015-07-10 NOTE — Patient Instructions (Signed)
Your exam is still consistent with IT band syndrome/strain, extensor tendon strain, and medial knee contusion. Once your OB/Gyn clears you restart the physical therapy (call them and/or [place] after that appointment). Avoid painful activities as much as possible. Ice over area of pain 3-4 times a day for 15 minutes at a time. Do home exercises on days you don't go to therapy. Aleve 2 tabs twice a day with food for pain and inflammation. Ok to stop the boot as you have done at this point. Follow up with me 4-6 weeks after restarting therapy.

## 2015-07-11 MED ORDER — DICLOFENAC SODIUM 75 MG PO TBEC: 75.0000 mg | DELAYED_RELEASE_TABLET | Freq: Two times a day (BID) | ORAL | Status: DC

## 2015-07-11 NOTE — Telephone Encounter (Signed)
Lorraine Conrad please advise on refill.  Last refill was 05/18/15. Pt last OV 06/14/15.  Last refill was 30 with 1 refill.

## 2015-07-11 NOTE — Telephone Encounter (Signed)
I refilled her diclofenac. I think she has upcoming appointment. She needs 30 minutes

## 2015-07-12 DIAGNOSIS — N838 Other noninflammatory disorders of ovary, fallopian tube and broad ligament: Secondary | ICD-10-CM | POA: Insufficient documentation

## 2015-07-12 NOTE — Assessment & Plan Note (Signed)
2/2 contusion.  Icing, physical therapy, aleve.

## 2015-07-12 NOTE — Assessment & Plan Note (Signed)
2/2 IT band strain.  Unfortunately because of her surgery she's had to hold on physical therapy - she will restart this when cleared from her OB/Gyn.  Icing, aleve, tylenol.  F/u 4-6 weeks after restarting PT.

## 2015-07-12 NOTE — Assessment & Plan Note (Signed)
2/2 posterior tibialis and extensor tendon strains.  Will retart physical therapy and home exercises as noted above.  Out of cam walker.  Icing, aleve.
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
## 2015-07-18 ENCOUNTER — Telehealth: Payer: Self-pay | Admitting: Family Medicine

## 2015-07-19 NOTE — Telephone Encounter (Signed)
If necessary she can stick with wearing the boot until she can start physical therapy.  Especially if inserts (like dr. Zoe Lan active series, superfeet, or something similar) don't provide her enough support to where she doesn't feel unstable.

## 2015-07-19 NOTE — Telephone Encounter (Signed)
Spoke with patient and told her that she can wear the boot or she can try an ASO.

## 2015-07-25 ENCOUNTER — Ambulatory Visit: Payer: BLUE CROSS/BLUE SHIELD | Attending: Family Medicine | Admitting: Physical Therapy

## 2015-07-25 ENCOUNTER — Ambulatory Visit: Payer: BLUE CROSS/BLUE SHIELD | Admitting: Family Medicine

## 2015-07-25 ENCOUNTER — Encounter: Payer: Self-pay | Admitting: Physical Therapy

## 2015-07-25 DIAGNOSIS — M25572 Pain in left ankle and joints of left foot: Secondary | ICD-10-CM | POA: Insufficient documentation

## 2015-07-25 DIAGNOSIS — R29898 Other symptoms and signs involving the musculoskeletal system: Secondary | ICD-10-CM | POA: Insufficient documentation

## 2015-07-25 DIAGNOSIS — M25562 Pain in left knee: Secondary | ICD-10-CM | POA: Diagnosis present

## 2015-07-25 DIAGNOSIS — R262 Difficulty in walking, not elsewhere classified: Secondary | ICD-10-CM | POA: Insufficient documentation

## 2015-07-25 DIAGNOSIS — M25552 Pain in left hip: Secondary | ICD-10-CM | POA: Insufficient documentation

## 2015-07-25 NOTE — Therapy (Signed)
[facility] [address] [place], [place], [address] Phone: [phone]   Fax:  [phone]  Physical Therapy Treatment  Patient Details  Name: Lorraine Conrad MRN: [id number] Date of Birth: 20-Aug-1970 Referring Provider: Karlton Lemon, MD  Encounter Date: 07/25/2015      PT End of Session - 07/25/15 1410    Visit Number 2   Date for PT Re-Evaluation 08/08/15   PT Start Time 1401   PT Stop Time 8343   PT Time Calculation (min) 44 min   Activity Tolerance Patient tolerated treatment well   Behavior During Therapy [facility] for tasks assessed/performed      Past Medical History  Diagnosis Date  . GERD (gastroesophageal reflux disease)   . Endometriosis   . Ovarian cyst, left   . External hemorrhoids   . Erosive gastritis   . Proctalgia fugax   . Allergy     SEASONAL  . Anemia   . OSA on CPAP   . Migraine 1998; 02/2014    "this one's lasted 10 days straight" (03/01/2014)  . Headache     "probably monthly" (03/01/2014)  . Arthritis     "spine" (03/01/2014)  . Chronic lower back pain   . H/O shortness of breath 2014    Cardiopulmonary exercise test results    Past Surgical History  Procedure Laterality Date  . Cesarean section  2002; 2006  . Temporomandibular joint surgery  ~ 1992  . Nasal septum surgery  ~ 1991  . Laparoscopic ovarian cystectomy  ~ 1999  . Upper gastrointestinal endoscopy    . Colonoscopy    . Abdominal hysterectomy  04/2013  . Breast biopsy Bilateral   . Breast lumpectomy Left ~ 1993    There were no vitals filed for this visit.  Visit Diagnosis:  Hip pain, acute, left  Pain in joint, ankle and foot, left  Knee pain, acute, left  Difficulty walking  Weakness of left leg      Subjective Assessment - 07/25/15 1402    Subjective Had abdominal surgery and has been able to rest a lot which has made the entire leg feel much better. Has had intermittent "weird" feelings in her foot.  She reports the MD did not seemed concerned.    Currently in Pain? Yes   Pain Score 5    Pain Location Back   Pain Orientation Right   Pain Descriptors / Indicators Aching;Sore   Aggravating Factors  MAybe coming off bed rest   Pain Relieving Factors Rest   Multiple Pain Sites No                         [facility] Adult PT Treatment/Exercise - 07/25/15 0001    Lumbar Exercises: Stretches   Single Knee to Chest Stretch 3 reps;20 seconds   Lower Trunk Rotation 3 reps;20 seconds   Lumbar Exercises: Supine   Ab Set 10 reps;3 seconds   Knee/Hip Exercises: Aerobic   Nustep L1 x 6 min   Knee/Hip Exercises: Standing   Hip Abduction AROM;Both;1 set;10 reps   Moist Heat Therapy   Moist Heat Location Lumbar Spine  During supine exs                PT Education - 07/25/15 1422    Education provided Yes   Education Details HEP: flexibility, core strength, hip strength   Person(s) Educated Patient   Methods Explanation;Demonstration;Tactile cues;Verbal cues;Handout   Comprehension Verbalized understanding;Returned  demonstration;Verbal cues required;Tactile cues required          PT Short Term Goals - 07/25/15 1446    PT SHORT TERM GOAL #1   Title be independent in independent HEP   Time 4   Period Weeks   Status Achieved   PT SHORT TERM GOAL #2   Title report a 30% reduction in Lt LE pain with standing and walking   Time 4   Period Weeks   Status Achieved  50%   PT SHORT TERM GOAL #3   Title wean from CAM boot for all household distances and demonstrate symmetry   Time 4   Period Weeks   Status Achieved   PT SHORT TERM GOAL #4   Title ascend steps with step over step gait with use of 1 rail   Time 4   Status On-going           PT Long Term Goals - 06/13/15 1458    PT LONG TERM GOAL #1   Title be independent in advanced HEP   Time 8   Period Weeks   Status New   PT LONG TERM GOAL #2   Title reduce FOTO to < or = to 39% limitation   Time 8    Period Weeks   Status New   PT LONG TERM GOAL #3   Title wean from boot for all distances and demonstrate symmetry with ambulation   Time 8   Period Weeks   Status New   PT LONG TERM GOAL #4   Title demonstrate 4+/5 to 5/5 Lt LE strength to improve endurance for independent grocery shopping   Time 8   Period Weeks   Status New   PT LONG TERM GOAL #5   Title report a 70% reduction in Lt LE pain with standing and walking   Time 8   Period Weeks   Status New               Plan - 07/25/15 1442    Clinical Impression Statement First treatment after abdominal surgery. Pt presents without CAM boot and feeling much improved in her previous symptoms. The more activity she does she feels strain/pain in various places. Added some more HEP today to address potential weaknesses she  may have after her month of bed rest.  Pt has met most of her STG..    Pt will benefit from skilled therapeutic intervention in order to improve on the following deficits Decreased strength;Decreased mobility;Pain;Decreased activity tolerance;Decreased endurance;Difficulty walking;Abnormal gait   Rehab Potential Good   PT Frequency 2x / week   PT Duration 8 weeks   PT Treatment/Interventions ADLs/Self Care Home Management;Cryotherapy;Electrical Stimulation;Moist Heat;Therapeutic exercise;Therapeutic activities;Functional mobility training;Stair training;Gait training;Ultrasound;Neuromuscular re-education;Balance training;Patient/family education;Manual techniques;Taping;Dry needling;Passive range of motion   PT Next Visit Plan Review HEP given today. NUstep   Consulted and Agree with Plan of Care Patient        Problem List Patient Active Problem List   Diagnosis Date Noted  . Mass of ovary 07/12/2015  . History of laparoscopy 06/19/2015  . Left hip pain 05/24/2015  . Left knee pain 05/24/2015  . Left ankle pain 05/24/2015  . Chest wall pain 05/16/2015  . OSA (obstructive sleep apnea) 03/01/2015   . Leukocytosis 03/01/2015  . Leg weakness, bilateral 03/01/2015  . B12 deficiency 03/01/2015  . Allergic rhinitis 12/12/2014  . Dyspnea 12/12/2014  . SOB (shortness of breath) 10/30/2014  . Migraine 07/10/2014  . Hemorrhoid 07/10/2014  .  Arthralgia 05/08/2014  . Tachycardia 03/29/2014  . Tinnitus 03/27/2014  . Fatigue 03/27/2014  . Back pain 03/27/2014  . Shingles 03/09/2014  . Fever 03/09/2014  . Hot flashes 03/09/2014  . Meningitis, viral 03/02/2014  . Headache 03/01/2014  . Myalgia and myositis 02/23/2014  . Headache(784.0) 02/23/2014  . Routine general medical examination at a health care facility 01/16/2014  . Psoriasis 01/16/2014  . Obesity (BMI 30-39.9) 01/16/2014  . Neck pain 01/01/2014  . Dysuria 10/11/2013  . Abnormal CBC 10/11/2013  . Watery eyes 10/11/2013  . Plantar fasciitis of left foot 03/18/2013  . IBS (irritable bowel syndrome) 03/18/2013  . Nausea alone 02/17/2013  . PLEURISY 11/10/2008  . GASTRITIS 08/10/2008  . ANXIETY 01/06/2008  . PROCTALGIA FUGAX 01/06/2008  . ABDOMINAL PAIN-MULTIPLE SITES 01/06/2008  . GERD 11/15/2007  . CONSTIPATION 11/15/2007  . Chest pain 11/15/2007  . OVARIAN CYST, LEFT 11/11/2007    Emilea Goga, PTA 07/25/2015, 2:48 PM  [facility] [address] [place], [place], [address] Phone: [phone]   Fax:  [phone]  Name: Jamine Wingate MRN: [id number] Date of Birth: [birth year]

## 2015-07-25 NOTE — Patient Instructions (Signed)
Lower abdominal/core stability exercises  1. Practice your breathing technique: Inhale through your nose expanding your belly and rib cage. Try not to breathe into your chest. Exhale slowly and gradually out your mouth feeling a sense of softness to your body. Practice multiple times. This can be performed unlimited.  2. Finding the lower abdominals. Laying on your back with the knees bent, place your fingers just below your belly button. Using your breathing technique from above, on your exhale gently pull the belly button away from your fingertips without tensing any other muscles. Practice this 5x. Next, as you exhale, draw belly button inwards and hold onto it...then feel as if you are pulling that muscle across your pelvis like you are tightening a belt. This can be hard to do at first so be patient and practice. Do 5-10 reps 1-3 x day. Always recognize quality over quantity; if your abdominal muscles become tired you will notice you may tighten/contract other muscles. This is the time to take a break.   Practice this first laying on your back, then in sitting, progressing to standing and finally adding it to all your daily movements.   3. Finding your pelvic floor. Using the breathing technique above, when your exhale, this time draw your pelvic floor muscles up as if you were attempting to stop the flow of urination. Be careful NOT to tense any other muscles. This can be hard, BE PATIENT. Try to hold up to 10 seconds repeating 10x. Try 2x a day. Once you feel you are doing this well, add this contraction to exercise #2. First contracting your pelvic floor followed by lower abdominals.  4. Adding leg movements. Add the following leg movements to challenge your ability to keep your core stable:  1. Single leg drop outs: Laying on your back with knees bent feet flat. Inhale,  dropping one knee outward KEEPING YOUR PELVIS STILL. Exhale as you bring the leg back, simultaneously performing your lower  abdominal contraction. Do 5-10 on each leg.  2. Marching: While keeping your pelvis still, lift the right foot a few inches, put it down then lift left foot. This will mimic a march. Start slow to establish control. Once you have control you may speed it up. Do 10-20x. You MUST keep your lower abdominlas contracted while you march. Breathe naturally   3. Single leg slides: Inhale while you slowly slide one leg out keeping your pelvis still. Only slide your leg as far as you can keep your pelvis still. Exhale as you bring the leg back to the start, contracting the lower abdominals as you do that. Keep your upper body relaxed. Do 5-10 on each side. Lower abdominal/core stability exercises  1. Practice your breathing technique: Inhale through your nose expanding your belly and rib cage. Try not to breathe into your chest. Exhale slowly and gradually out your mouth feeling a sense of softness to your body. Practice multiple times. This can be performed unlimited.  2. Finding the lower abdominals. Laying on your back with the knees bent, place your fingers just below your belly button. Using your breathing technique from above, on your exhale gently pull the belly button away from your fingertips without tensing any other muscles. Practice this 5x. Next, as you exhale, draw belly button inwards and hold onto it...then feel as if you are pulling that muscle across your pelvis like you are tightening a belt. This can be hard to do at first so be patient and practice. Do 5-10   reps 1-3 x day. Always recognize quality over quantity; if your abdominal muscles become tired you will notice you may tighten/contract other muscles. This is the time to take a break.   Practice this first laying on your back, then in sitting, progressing to standing and finally adding it to all your daily movements.   3. Finding your pelvic floor. Using the breathing technique above, when your exhale, this time draw your pelvic floor  muscles up as if you were attempting to stop the flow of urination. Be careful NOT to tense any other muscles. This can be hard, BE PATIENT. Try to hold up to 10 seconds repeating 10x. Try 2x a day. Once you feel you are doing this well, add this contraction to exercise #2. First contracting your pelvic floor followed by lower abdominals.   4. Adding leg movements. Add the following leg movements to challenge your ability to keep your core stable:  1. Single leg drop outs: Laying on your back with knees bent feet flat. Inhale,  dropping one knee outward KEEPING YOUR PELVIS STILL. Exhale as you bring the leg back, simultaneously performing your lower abdominal contraction. Do 5-10 on each leg.   2. Marching: While keeping your pelvis still, lift the right foot a few inches, put it down then lift left foot. This will mimic a march. Start slow to establish control. Once you have control you may speed it up. Do 10-20x. You MUST keep your lower abdominlas contracted while you march. Breathe naturally    3. Single leg slides: Inhale while you slowly slide one leg out keeping your pelvis still. Only slide your leg as far as you can keep your pelvis still. Exhale as you bring the leg back to the start, contracting the lower abdominals as you do that. Keep your upper body relaxed. Do 5-10 on each side.        ABDUCTION: Standing (Active)    Stand, feet flat. Lift right leg out to side. No weights yet  Complete __1-2 _ sets of __10_ repetitions. Perform __1-2_ sessions per day. Lower abs IN! Stand with good posture! Start with small lift only, do not lift into the back.  Knee-to-Chest Stretch: Unilateral    With hand behind right knee, pull knee in to chest until a comfortable stretch is felt in lower back and buttocks. Keep back relaxed. Hold __20__ seconds. Do both sides. EASY!! Repeat __3__ times per set. Do _2___ sessions per day.  Lower Trunk Rotation    Bring both knees in to chest.  Rotate from side to side, keeping knees together and feet off floor. Just progress slowly into the motion.  Repeat ___10_ times per set. Do ___1_ sets per session. Do __1-2__ sessions per day.  Plantar Flexion: Isometric   Press left foot into wall or rolled pillow against wall. Hold _5___ seconds. Relax. Repeat _10___ times per set.  Do _2___ sessions per day.  [email or web address]   Inversion: Isometric   Press inner borders of feet into ball or rolled pillow between feet. Hold ___5_ seconds. Relax. Repeat __10__ times per set.. Do _1-2___ sessions per day.  [email or web address]   Pillow Squeeze (Isometric Dorsiflexion)   Lying with pillow between feet, right  foot on top, squeeze feet together, bringing bottom foot up while pushing top one down. Hold __5__ seconds.. Repeat __10__ times. Do __1-2__ sessions per day.  [email or web address]   Eversion: Isometric   Press outer border of right foot into ball or rolled pillow against wall.  OR, cross the right foot over the left and push outwards with the left foot INTO the right.  Hold ___510_ seconds. Relax. Repeat ____ times per set.. Do _1-2___ sessions per day.  [email or web address]   Copyright  VHI. All rights reserved.    [email or web address]://[email or web address]   Copyright  VHI. All rights reserved.    [email or web address]   Copyright  VHI. All rights reserved.    [email or web address]   Copyright  VHI. All rights reserved.

## 2015-08-01 ENCOUNTER — Telehealth: Payer: Self-pay | Admitting: Medical

## 2015-08-01 ENCOUNTER — Ambulatory Visit (INDEPENDENT_AMBULATORY_CARE_PROVIDER_SITE_OTHER): Payer: BLUE CROSS/BLUE SHIELD | Admitting: Medical

## 2015-08-01 ENCOUNTER — Encounter: Payer: Self-pay | Admitting: Medical

## 2015-08-01 ENCOUNTER — Ambulatory Visit: Payer: BLUE CROSS/BLUE SHIELD | Admitting: Physical Therapy

## 2015-08-01 VITALS — BP 120/84 | HR 77 | Temp 98.1°F | Ht 64.0 in | Wt 210.8 lb

## 2015-08-01 DIAGNOSIS — J0100 Acute maxillary sinusitis, unspecified: Secondary | ICD-10-CM

## 2015-08-01 DIAGNOSIS — R519 Headache, unspecified: Secondary | ICD-10-CM

## 2015-08-01 DIAGNOSIS — R51 Headache: Secondary | ICD-10-CM

## 2015-08-01 DIAGNOSIS — R300 Dysuria: Secondary | ICD-10-CM

## 2015-08-01 LAB — POC URINALSYSI DIPSTICK (AUTOMATED)
Bilirubin, UA: NEGATIVE
Glucose, UA: NEGATIVE
Ketones, UA: NEGATIVE
LEUKOCYTES UA: NEGATIVE
Nitrite, UA: NEGATIVE
PH UA: 6
PROTEIN UA: NEGATIVE
SPEC GRAV UA: 1.02
UROBILINOGEN UA: 0.2

## 2015-08-01 LAB — SEDIMENTATION RATE: SED RATE: 34 mm/h — AB (ref 0–22)

## 2015-08-01 MED ORDER — PREDNISONE 20 MG PO TABS: ORAL_TABLET | ORAL | Status: DC

## 2015-08-01 MED ORDER — DOXYCYCLINE HYCLATE 100 MG PO TABS: ORAL_TABLET | ORAL | Status: DC

## 2015-08-01 MED ORDER — AZELASTINE HCL 0.1 % NA SOLN: 2.0000 | Freq: Two times a day (BID) | NASAL | Status: DC

## 2015-08-01 NOTE — Progress Notes (Signed)
Subjective:    Patient ID: Lorraine Conrad, female    DOB: 1970-11-26, 45 y.o.   MRN: [id number]  HPI  Pt in with some sinus issues. Pt states she got congested for about one week. This congestion has lingered on. Pt can feel a lot  drainage down her throat. Pt has sinus pain. No sneezing or itching eyes. She did sneeze this am.    Review of Systems  Constitutional: Negative for fever, chills and fatigue.  HENT: Positive for congestion and sinus pressure.   Respiratory: Positive for cough.        Some productive cough.  Cardiovascular: Positive for palpitations. Negative for chest pain.  Gastrointestinal: Negative for abdominal pain.  Musculoskeletal: Negative for myalgias, arthralgias and gait problem.  Skin: Negative for pallor.  Neurological: Negative for dizziness, tremors, weakness and headaches.          Hematological: Negative for adenopathy. Does not bruise/bleed easily.  Psychiatric/Behavioral: Negative for behavioral problems and confusion.     Past Medical History  Diagnosis Date  . GERD (gastroesophageal reflux disease)   . Endometriosis   . Ovarian cyst, left   . External hemorrhoids   . Erosive gastritis   . Proctalgia fugax   . Allergy     SEASONAL  . Anemia   . OSA on CPAP   . Migraine 1998; 02/2014    "this one's lasted 10 days straight" (03/01/2014)  . Headache     "probably monthly" (03/01/2014)  . Arthritis     "spine" (03/01/2014)  . Chronic lower back pain   . H/O shortness of breath 2014    Cardiopulmonary exercise test results    Social History   Social History  . Marital Status: Married    Spouse Name: N/A  . Number of Children: N/A  . Years of Education: N/A   Occupational History  . Not on file.   Social History Main Topics  . Smoking status: Never Smoker   . Smokeless tobacco: Never Used  . Alcohol Use: 0.0 oz/week    0 Standard drinks or equivalent per week     Comment: 03/01/2014 "I'll have 1-2 drinks maybe 3-4 times/yr"  .  Drug Use: No  . Sexual Activity: Yes   Other Topics Concern  . Not on file   Social History Narrative   Daily caffiene use: 6 cups per day   Patient does not get regular excercise    Past Surgical History  Procedure Laterality Date  . Cesarean section  2002; 2006  . Temporomandibular joint surgery  ~ 1992  . Nasal septum surgery  ~ 1991  . Laparoscopic ovarian cystectomy  ~ 1999  . Upper gastrointestinal endoscopy    . Colonoscopy    . Abdominal hysterectomy  04/2013  . Breast biopsy Bilateral   . Breast lumpectomy Left ~ 1993    Family History  Problem Relation Age of Onset  . Breast cancer Maternal Grandmother   . Colon cancer Neg Hx   . Prostate cancer Paternal Grandfather   . Colon polyps Father   . Heart disease Father     Allergies  Allergen Reactions  . Promethazine Hcl Other (See Comments)    Violent tremors   . Cephalexin Diarrhea and Itching  . Codeine Other (See Comments)    Was a child when she took this.  . Dilaudid [Hydromorphone] Nausea And Vomiting  . Azithromycin Rash    Head to toe rash    Current Outpatient Prescriptions on File  Prior to Visit  Medication Sig Dispense Refill  . acetaminophen (TYLENOL) 500 MG tablet Take 1 tablet (500 mg total) by mouth every 6 (six) hours as needed for mild pain or headache. For pain 30 tablet 0  . albuterol (PROVENTIL HFA;VENTOLIN HFA) 108 (90 BASE) MCG/ACT inhaler Inhale 2 puffs into the lungs every 6 (six) hours as needed for wheezing or shortness of breath. 1 Inhaler 3  . beclomethasone (QVAR) 40 MCG/ACT inhaler Inhale 2 puffs into the lungs 2 (two) times daily at 10 AM and 5 PM. 1 Inhaler 3  . cyclobenzaprine (FLEXERIL) 10 MG tablet Take 1 tablet (10 mg total) by mouth at bedtime. 45 tablet 0  . diclofenac (VOLTAREN) 75 MG EC tablet Take 1 tablet (75 mg total) by mouth 2 (two) times daily. 30 tablet 1  . DULoxetine (CYMBALTA) 30 MG capsule Take 1 capsule (30 mg total) by mouth daily. 30 capsule 3  .  lansoprazole (PREVACID) 30 MG capsule Take 30 mg by mouth daily at 12 noon.    . loratadine (CLARITIN) 10 MG tablet Take 1 tablet (10 mg total) by mouth daily. 30 tablet 0  . methocarbamol (ROBAXIN) 500 MG tablet 1 tab po q hs as needed muscle spasms 15 tablet 0  . mometasone (NASONEX) 50 MCG/ACT nasal spray USE 2 SPRAYS IN EACH NOSTRIL EVERY DAY 17 g 1  . ranitidine (ZANTAC) 150 MG capsule Take 1 capsule (150 mg total) by mouth 2 (two) times daily. 180 capsule 1  . vitamin B-12 (CYANOCOBALAMIN) 100 MCG tablet Take 1 tablet (100 mcg total) by mouth daily. 30 tablet 3   No current facility-administered medications on file prior to visit.    BP 120/84 mmHg  Pulse 77  Temp(Src) 98.1 F (36.7 C) (Oral)  Ht 5\' 4"  (1.626 m)  Wt 210 lb 12.8 oz (95.618 kg)  BMI 36.17 kg/m2  SpO2 98%  LMP 02/08/2013       Objective:   Physical Exam  General  Mental Status - Alert. General Appearance - Well groomed. Not in acute distress.  Skin Rashes- No Rashes. On inspection of left temporal area no dilated veins. And not tender.  HEENT Head- Normal. Ear Auditory Canal - Left- Normal. Right - Normal.Tympanic Membrane- Left- Normal. Right- Normal. Eye Sclera/Conjunctiva- Left- Normal. Right- Normal. Nose & Sinuses Nasal Mucosa- Left-  Boggy and Congested. Right-  Boggy and  Congested.Bilateral lt  maxillary and  Lt frontal sinus pressure. Mouth & Throat Lips: Upper Lip- Normal: no dryness, cracking, pallor, cyanosis, or vesicular eruption. Lower Lip-Normal: no dryness, cracking, pallor, cyanosis or vesicular eruption. Buccal Mucosa- Bilateral- No Aphthous ulcers. Oropharynx- No Discharge or Erythema. Tonsils: Characteristics- Bilateral- No Erythema or Congestion. Size/Enlargement- Bilateral- No enlargement. Discharge- bilateral-None.  Neck Neck- Supple. No Masses.   Chest and Lung Exam Auscultation: Breath Sounds:-Clear even and unlabored.  Cardiovascular Auscultation:Rythm- Regular, rate  and rhythm. Murmurs & Other Heart Sounds:Ausculatation of the heart reveal- No Murmurs.  Lymphatic Head & Neck General Head & Neck Lymphatics: Bilateral: Description- No Localized lymphadenopathy.  Neuro- cn III-xII intact.         Assessment & Plan:  You appear to have a sinus infection. I am prescribing doxycycline  antibiotic for the infection. To help with the nasal congestion I prescribed astelin spray. Can continue nasonex. For your associated cough,would recommend delysm.  Rest, hydrate, tylenol for fever.  Follow up in 7 days or as needed.  End mentioned side of her head pressure occurs 3 times  over past month.Points to left temporal area.  No gross motor or sensory function deficits. Will get sed rate today. This occurs on and off rare.  Then at very end of exam states burning sensation.

## 2015-08-01 NOTE — Progress Notes (Signed)
Pre visit review using our clinic review tool, if applicable. No additional management support is needed unless otherwise documented below in the visit note. 

## 2015-08-01 NOTE — Patient Instructions (Addendum)
You appear to have a sinus infection. I am prescribing doxycycline  antibiotic for the infection. To help with the nasal congestion I prescribed astelin spray. Can continue nasonex. For your associated cough,would recommend delysm.  Rest, hydrate, tylenol for fever.  Follow up in 7 days or as needed.

## 2015-08-01 NOTE — Telephone Encounter (Signed)
Pleas Koch. Who are the specialst that do temporal artery biospy. I want to refer Lorraine Conrad for left temporal area pain with sed rate elevation.

## 2015-08-02 ENCOUNTER — Telehealth: Payer: Self-pay | Admitting: Medical

## 2015-08-02 DIAGNOSIS — R519 Headache, unspecified: Secondary | ICD-10-CM

## 2015-08-02 DIAGNOSIS — R51 Headache: Principal | ICD-10-CM

## 2015-08-02 DIAGNOSIS — R700 Elevated erythrocyte sedimentation rate: Secondary | ICD-10-CM

## 2015-08-02 NOTE — Telephone Encounter (Signed)
Will you refer to ent. Thanks, tomorrow or by Monday?

## 2015-08-02 NOTE — Telephone Encounter (Signed)
The doctors at [facility] do these

## 2015-08-03 LAB — URINE CULTURE
Colony Count: NO GROWTH
ORGANISM ID, BACTERIA: NO GROWTH

## 2015-08-08 ENCOUNTER — Encounter: Payer: Self-pay | Admitting: Physical Therapy

## 2015-08-08 ENCOUNTER — Other Ambulatory Visit: Payer: Self-pay

## 2015-08-08 ENCOUNTER — Ambulatory Visit: Payer: BLUE CROSS/BLUE SHIELD | Attending: Family Medicine | Admitting: Physical Therapy

## 2015-08-08 DIAGNOSIS — R29898 Other symptoms and signs involving the musculoskeletal system: Secondary | ICD-10-CM | POA: Insufficient documentation

## 2015-08-08 DIAGNOSIS — M25552 Pain in left hip: Secondary | ICD-10-CM | POA: Insufficient documentation

## 2015-08-08 DIAGNOSIS — M25562 Pain in left knee: Secondary | ICD-10-CM | POA: Insufficient documentation

## 2015-08-08 DIAGNOSIS — M25572 Pain in left ankle and joints of left foot: Secondary | ICD-10-CM | POA: Diagnosis present

## 2015-08-08 DIAGNOSIS — R262 Difficulty in walking, not elsewhere classified: Secondary | ICD-10-CM | POA: Insufficient documentation

## 2015-08-08 MED ORDER — PREDNISONE 20 MG PO TABS: ORAL_TABLET | ORAL | Status: DC

## 2015-08-08 NOTE — Therapy (Signed)
[facility] [address] [place], [place], [address] Phone: [phone]   Fax:  [phone]  Physical Therapy Treatment  Patient Details  Name: Lorraine Conrad MRN: [id number] Date of Birth: 1971-03-29 Referring Provider: Karlton Lemon, MD  Encounter Date: 08/08/2015      PT End of Session - 08/08/15 1409    Visit Number 3   Date for PT Re-Evaluation 10/05/15   PT Start Time 1401   PT Stop Time 1500   PT Time Calculation (min) 59 min   Activity Tolerance Patient tolerated treatment well   Behavior During Therapy [facility] for tasks assessed/performed      Past Medical History  Diagnosis Date  . GERD (gastroesophageal reflux disease)   . Endometriosis   . Ovarian cyst, left   . External hemorrhoids   . Erosive gastritis   . Proctalgia fugax   . Allergy     SEASONAL  . Anemia   . OSA on CPAP   . Migraine 1998; 02/2014    "this one's lasted 10 days straight" (03/01/2014)  . Headache     "probably monthly" (03/01/2014)  . Arthritis     "spine" (03/01/2014)  . Chronic lower back pain   . H/O shortness of breath 2014    Cardiopulmonary exercise test results    Past Surgical History  Procedure Laterality Date  . Cesarean section  2002; 2006  . Temporomandibular joint surgery  ~ 1992  . Nasal septum surgery  ~ 1991  . Laparoscopic ovarian cystectomy  ~ 1999  . Upper gastrointestinal endoscopy    . Colonoscopy    . Abdominal hysterectomy  04/2013  . Breast biopsy Bilateral   . Breast lumpectomy Left ~ 1993    There were no vitals filed for this visit.  Visit Diagnosis:  Hip pain, acute, left - Plan: PT plan of care cert/re-cert  Pain in joint, ankle and foot, left - Plan: PT plan of care cert/re-cert  Knee pain, acute, left - Plan: PT plan of care cert/re-cert  Difficulty walking - Plan: PT plan of care cert/re-cert  Weakness of left leg - Plan: PT plan of care cert/re-cert      Subjective Assessment  - 08/08/15 1405    Subjective The feeling in her foot is about the same. She reports an incidence of "giving out" yesterday. Has spasms in her low back especially at night. Pt reports workingout with a trainer 1x since her last visit and she rpeorts this was ok.    Currently in Pain? Yes  Also has headache   Pain Score 4    Pain Location Foot   Pain Orientation Left   Aggravating Factors  Not sure   Pain Relieving Factors rest   Multiple Pain Sites No            [facility] PT Assessment - 08/08/15 0001    Assessment   Medical Diagnosis Hip pain, left (M25.552), strained IT band, posterior tibialis and extensor tendons of the Lt ankle and Lt knee contusion   Onset Date/Surgical Date 04/12/15   Precautions   Precautions Anterior Hip   Prior Function   Level of Independence Independent   Vocation Unemployed   Cognition   Overall Cognitive Status Within Functional Limits for tasks assessed   Observation/Other Assessments   Focus on Therapeutic Outcomes (FOTO)  44%   Strength   Left Hip Flexion 4/5   Left Hip External Rotation 4/5   Left Hip Internal Rotation 4/5  Left Hip ABduction 4-/5   Left Knee Flexion 4+/5   Left Knee Extension 4+/5   Left Ankle Dorsiflexion 4+/5  Pt did not tolerate touching the top of the foot   Left Ankle Inversion 4/5   Left Ankle Eversion 4/5                     [facility] Adult PT Treatment/Exercise - 08/08/15 0001    Knee/Hip Exercises: Aerobic   Nustep L2 x 7 min   Moist Heat Therapy   Number Minutes Moist Heat 15 Minutes   Moist Heat Location Lumbar Spine   Electrical Stimulation   Electrical Stimulation Location RT lumbar   Electrical Stimulation Action IFC   Electrical Stimulation Parameters 80-150HZ    Electrical Stimulation Goals Pain   Ultrasound   Ultrasound Location Dorsum of LT foot   Ultrasound Parameters 3MZ 50% 1wtcms   Ultrasound Goals Pain                  PT Short Term Goals - 08/08/15 1410    PT SHORT  TERM GOAL #4   Title ascend steps with step over step gait with use of 1 rail   Time 4   Period Weeks   Status Achieved           PT Long Term Goals - 08/08/15 1448    PT LONG TERM GOAL #1   Title be independent in advanced HEP   Time 8   Period Weeks   Status On-going   PT LONG TERM GOAL #2   Title reduce FOTO to < or = to 39% limitation   Time 8   Period Weeks   Status On-going  44%   PT LONG TERM GOAL #3   Title wean from boot for all distances and demonstrate symmetry with ambulation   Status Achieved   PT LONG TERM GOAL #4   Title demonstrate 4+/5 to 5/5 Lt LE strength to improve endurance for independent grocery shopping   Time 8   Period Weeks   Status On-going   PT LONG TERM GOAL #5   Title report a 70% reduction in Lt LE pain with standing and walking   Time 8   Period Weeks   Status On-going  30-40%               Plan - 08/08/15 1419    Clinical Impression Statement Pt continues to not need her CAM boot for any ambulation. She does report a tender dorsum of her foot that prevents her from walking long long distances at times. Her overall pain in all three areas is 30%-40% better. Pt's original POC was interrupted by an abdominal surgery, so tecnically she has only had 3 visits. PT  plans to renew pt for another 8 weeks.    Pt will benefit from skilled therapeutic intervention in order to improve on the following deficits Decreased strength;Decreased mobility;Pain;Decreased activity tolerance;Decreased endurance;Difficulty walking;Abnormal gait   Rehab Potential Good   PT Frequency 2x / week   PT Duration 8 weeks   PT Treatment/Interventions ADLs/Self Care Home Management;Cryotherapy;Electrical Stimulation;Moist Heat;Therapeutic exercise;Therapeutic activities;Functional mobility training;Stair training;Gait training;Ultrasound;Neuromuscular re-education;Balance training;Patient/family education;Manual techniques;Taping;Dry needling;Passive range of  motion   PT Next Visit Plan Renew per PT for 8 more weeks   Consulted and Agree with Plan of Care Patient        Problem List Patient Active Problem List   Diagnosis Date Noted  . Mass of ovary 07/12/2015  .  History of laparoscopy 06/19/2015  . Left hip pain 05/24/2015  . Left knee pain 05/24/2015  . Left ankle pain 05/24/2015  . Chest wall pain 05/16/2015  . OSA (obstructive sleep apnea) 03/01/2015  . Leukocytosis 03/01/2015  . Leg weakness, bilateral 03/01/2015  . B12 deficiency 03/01/2015  . Allergic rhinitis 12/12/2014  . Dyspnea 12/12/2014  . SOB (shortness of breath) 10/30/2014  . Migraine 07/10/2014  . Hemorrhoid 07/10/2014  . Arthralgia 05/08/2014  . Tachycardia 03/29/2014  . Tinnitus 03/27/2014  . Fatigue 03/27/2014  . Back pain 03/27/2014  . Shingles 03/09/2014  . Fever 03/09/2014  . Hot flashes 03/09/2014  . Meningitis, viral 03/02/2014  . Headache 03/01/2014  . Myalgia and myositis 02/23/2014  . Headache(784.0) 02/23/2014  . Routine general medical examination at a health care facility 01/16/2014  . Psoriasis 01/16/2014  . Obesity (BMI 30-39.9) 01/16/2014  . Neck pain 01/01/2014  . Dysuria 10/11/2013  . Abnormal CBC 10/11/2013  . Watery eyes 10/11/2013  . Plantar fasciitis of left foot 03/18/2013  . IBS (irritable bowel syndrome) 03/18/2013  . Nausea alone 02/17/2013  . PLEURISY 11/10/2008  . GASTRITIS 08/10/2008  . ANXIETY 01/06/2008  . PROCTALGIA FUGAX 01/06/2008  . ABDOMINAL PAIN-MULTIPLE SITES 01/06/2008  . GERD 11/15/2007  . CONSTIPATION 11/15/2007  . Chest pain 11/15/2007  . OVARIAN CYST, LEFT 11/11/2007      Myrene Galas, PTA 08/08/2015 3:33 PM Sigurd Sos, PT 08/08/2015 3:33 PM  [facility] [address], [place], [place], [address] Phone: [phone]   Fax:  [phone]  Name: Lorraine Conrad MRN: [id number] Date of Birth: 1971/03/12

## 2015-08-13 ENCOUNTER — Ambulatory Visit: Payer: BLUE CROSS/BLUE SHIELD | Admitting: Medical

## 2015-08-14 ENCOUNTER — Encounter: Payer: Self-pay | Admitting: Podiatry

## 2015-08-14 ENCOUNTER — Ambulatory Visit (INDEPENDENT_AMBULATORY_CARE_PROVIDER_SITE_OTHER): Payer: BLUE CROSS/BLUE SHIELD | Admitting: Podiatry

## 2015-08-14 VITALS — BP 110/71 | HR 87 | Resp 16

## 2015-08-14 DIAGNOSIS — L03032 Cellulitis of left toe: Secondary | ICD-10-CM

## 2015-08-14 DIAGNOSIS — L603 Nail dystrophy: Secondary | ICD-10-CM
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
## 2015-08-15 ENCOUNTER — Encounter: Payer: Self-pay | Admitting: Physical Therapy

## 2015-08-15 ENCOUNTER — Ambulatory Visit: Payer: BLUE CROSS/BLUE SHIELD | Admitting: Physical Therapy

## 2015-08-15 DIAGNOSIS — R262 Difficulty in walking, not elsewhere classified: Secondary | ICD-10-CM

## 2015-08-15 DIAGNOSIS — M25552 Pain in left hip: Secondary | ICD-10-CM | POA: Diagnosis not present

## 2015-08-15 DIAGNOSIS — M25562 Pain in left knee: Secondary | ICD-10-CM

## 2015-08-15 DIAGNOSIS — M25572 Pain in left ankle and joints of left foot: Secondary | ICD-10-CM

## 2015-08-15 DIAGNOSIS — R29898 Other symptoms and signs involving the musculoskeletal system: Secondary | ICD-10-CM

## 2015-08-15 NOTE — Therapy (Signed)
[facility] [address] [place], [place], [address] Phone: [phone]   Fax:  [phone]  Physical Therapy Treatment  Patient Details  Name: Lorraine Conrad MRN: [id number] Date of Birth: 23-Nov-1970 Referring Provider: Karlton Lemon, MD  Encounter Date: 08/15/2015      PT End of Session - 08/15/15 1450    Visit Number 4   Date for PT Re-Evaluation 10/05/15   PT Start Time [id number]   PT Stop Time 1535   PT Time Calculation (min) 48 min   Activity Tolerance Patient tolerated treatment well   Behavior During Therapy [facility] for tasks assessed/performed      Past Medical History  Diagnosis Date  . GERD (gastroesophageal reflux disease)   . Endometriosis   . Ovarian cyst, left   . External hemorrhoids   . Erosive gastritis   . Proctalgia fugax   . Allergy     SEASONAL  . Anemia   . OSA on CPAP   . Migraine 1998; 02/2014    "this one's lasted 10 days straight" (03/01/2014)  . Headache     "probably monthly" (03/01/2014)  . Arthritis     "spine" (03/01/2014)  . Chronic lower back pain   . H/O shortness of breath 2014    Cardiopulmonary exercise test results    Past Surgical History  Procedure Laterality Date  . Cesarean section  2002; 2006  . Temporomandibular joint surgery  ~ 1992  . Nasal septum surgery  ~ 1991  . Laparoscopic ovarian cystectomy  ~ 1999  . Upper gastrointestinal endoscopy    . Colonoscopy    . Abdominal hysterectomy  04/2013  . Breast biopsy Bilateral   . Breast lumpectomy Left ~ 1993    There were no vitals filed for this visit.  Visit Diagnosis:  Hip pain, acute, left  Pain in joint, ankle and foot, left  Knee pain, acute, left  Difficulty walking  Weakness of left leg      Subjective Assessment - 08/15/15 1450    Subjective Reports feeling worse after last session. Just had great toe nail removed yesterday so it it very tender today. Reports no giving way of her LT leg,  tenderness ontop of foot is the same. Worked out with trainer on Monday and she reports this went fine.    Currently in Pain? No/denies   Multiple Pain Sites No                         [facility] Adult PT Treatment/Exercise - 08/15/15 0001    Knee/Hip Exercises: Aerobic   Nustep L2 x 8 min   Knee/Hip Exercises: Standing   Forward Step Up Left;2 sets;10 reps;Hand Hold: 2;Step Height: 6"   Cryotherapy   Number Minutes Cryotherapy 10 Minutes   Cryotherapy Location Ankle   Type of Cryotherapy Ice pack   Ankle Exercises: Standing   Rebounder Weight shifting 1 min each way   Heel Raises 10 reps   Other Standing Ankle Exercises Resisted walking 20# 5x each way                  PT Short Term Goals - 08/08/15 1410    PT SHORT TERM GOAL #4   Title ascend steps with step over step gait with use of 1 rail   Time 4   Period Weeks   Status Achieved           PT Long Term Goals -  08/15/15 1515    PT LONG TERM GOAL #1   Title be independent in advanced HEP   Time 8   Period Weeks   Status On-going   PT LONG TERM GOAL #4   Title demonstrate 4+/5 to 5/5 Lt LE strength to improve endurance for independent grocery shopping   Time 8   Period Weeks   Status On-going   PT LONG TERM GOAL #5   Title report a 70% reduction in Lt LE pain with standing and walking   Time 8   Period Weeks   Status On-going  45%-50% improved               Plan - 08/15/15 1514    Clinical Impression Statement Pt's pain was not affected by modalities last session so we focused 100% on weightbearing/closed chain exercises. She had no increased pain performing the exercises today. Upon looking at her LT knee it visually looks swollen compared t othe RT. She will try some ice x10 days to see if that helps.     Rehab Potential Good   PT Frequency 2x / week   PT Duration 8 weeks   PT Treatment/Interventions ADLs/Self Care Home Management;Cryotherapy;Electrical Stimulation;Moist  Heat;Therapeutic exercise;Therapeutic activities;Functional mobility training;Stair training;Gait training;Ultrasound;Neuromuscular re-education;Balance training;Patient/family education;Manual techniques;Taping;Dry needling;Passive range of motion   PT Next Visit Plan Continue with LT lower chain strengthening   Consulted and Agree with Plan of Care Patient        Problem List Patient Active Problem List   Diagnosis Date Noted  . Mass of ovary 07/12/2015  . History of laparoscopy 06/19/2015  . Left hip pain 05/24/2015  . Left knee pain 05/24/2015  . Left ankle pain 05/24/2015  . Chest wall pain 05/16/2015  . OSA (obstructive sleep apnea) 03/01/2015  . Leukocytosis 03/01/2015  . Leg weakness, bilateral 03/01/2015  . B12 deficiency 03/01/2015  . Allergic rhinitis 12/12/2014  . Dyspnea 12/12/2014  . SOB (shortness of breath) 10/30/2014  . Migraine 07/10/2014  . Hemorrhoid 07/10/2014  . Arthralgia 05/08/2014  . Tachycardia 03/29/2014  . Tinnitus 03/27/2014  . Fatigue 03/27/2014  . Back pain 03/27/2014  . Shingles 03/09/2014  . Fever 03/09/2014  . Hot flashes 03/09/2014  . Meningitis, viral 03/02/2014  . Headache 03/01/2014  . Myalgia and myositis 02/23/2014  . Headache(784.0) 02/23/2014  . Routine general medical examination at a health care facility 01/16/2014  . Psoriasis 01/16/2014  . Obesity (BMI 30-39.9) 01/16/2014  . Neck pain 01/01/2014  . Dysuria 10/11/2013  . Abnormal CBC 10/11/2013  . Watery eyes 10/11/2013  . Plantar fasciitis of left foot 03/18/2013  . IBS (irritable bowel syndrome) 03/18/2013  . Nausea alone 02/17/2013  . PLEURISY 11/10/2008  . GASTRITIS 08/10/2008  . ANXIETY 01/06/2008  . PROCTALGIA FUGAX 01/06/2008  . ABDOMINAL PAIN-MULTIPLE SITES 01/06/2008  . GERD 11/15/2007  . CONSTIPATION 11/15/2007  . Chest pain 11/15/2007  . OVARIAN CYST, LEFT 11/11/2007    COCHRAN,JENNIFER, PTA 08/15/2015, 3:26 PM  [facility] [address] [place], [place], [address] Phone: [phone]   Fax:  [phone]  Name: Lorraine Conrad MRN: [id number] Date of Birth: 11-Dec-1970

## 2015-08-15 NOTE — Patient Instructions (Signed)
Try to ice LT knee 1-2 x day for 10-15 min. Pt verbally agreed and understood.

## 2015-08-20 ENCOUNTER — Encounter: Payer: Self-pay | Admitting: Physical Therapy

## 2015-08-20 ENCOUNTER — Ambulatory Visit: Payer: BLUE CROSS/BLUE SHIELD | Admitting: Physical Therapy

## 2015-08-20 DIAGNOSIS — R29898 Other symptoms and signs involving the musculoskeletal system: Secondary | ICD-10-CM

## 2015-08-20 DIAGNOSIS — M25562 Pain in left knee: Secondary | ICD-10-CM

## 2015-08-20 DIAGNOSIS — M25552 Pain in left hip: Secondary | ICD-10-CM

## 2015-08-20 DIAGNOSIS — R262 Difficulty in walking, not elsewhere classified: Secondary | ICD-10-CM

## 2015-08-20 DIAGNOSIS — M25572 Pain in left ankle and joints of left foot: Secondary | ICD-10-CM

## 2015-08-20 NOTE — Therapy (Signed)
[facility] [address] [place], [place], [address] Phone: [phone]   Fax:  [phone]  Physical Therapy Treatment  Patient Details  Name: Lorraine Conrad MRN: [id number] Date of Birth: 1971-02-01 Referring Provider: Karlton Lemon, MD  Encounter Date: 08/20/2015      PT End of Session - 08/20/15 1450    Visit Number 5   Date for PT Re-Evaluation 10/05/15   PT Start Time [id number]   PT Stop Time 1540   PT Time Calculation (min) 51 min   Activity Tolerance Patient tolerated treatment well   Behavior During Therapy [facility] for tasks assessed/performed      Past Medical History  Diagnosis Date  . GERD (gastroesophageal reflux disease)   . Endometriosis   . Ovarian cyst, left   . External hemorrhoids   . Erosive gastritis   . Proctalgia fugax   . Allergy     SEASONAL  . Anemia   . OSA on CPAP   . Migraine 1998; 02/2014    "this one's lasted 10 days straight" (03/01/2014)  . Headache     "probably monthly" (03/01/2014)  . Arthritis     "spine" (03/01/2014)  . Chronic lower back pain   . H/O shortness of breath 2014    Cardiopulmonary exercise test results    Past Surgical History  Procedure Laterality Date  . Cesarean section  2002; 2006  . Temporomandibular joint surgery  ~ 1992  . Nasal septum surgery  ~ 1991  . Laparoscopic ovarian cystectomy  ~ 1999  . Upper gastrointestinal endoscopy    . Colonoscopy    . Abdominal hysterectomy  04/2013  . Breast biopsy Bilateral   . Breast lumpectomy Left ~ 1993    There were no vitals filed for this visit.  Visit Diagnosis:  Hip pain, acute, left  Pain in joint, ankle and foot, left  Knee pain, acute, left  Difficulty walking  Weakness of left leg      Subjective Assessment - 08/20/15 1451    Subjective Did good per pt report after last session.    Currently in Pain? Yes   Pain Score 3    Pain Location Knee   Pain Orientation Left   Pain Descriptors  / Indicators --  popping   Aggravating Factors  Nothing specific   Pain Relieving Factors Ice felt good after tx.   Multiple Pain Sites No                         [facility] Adult PT Treatment/Exercise - 08/20/15 0001    Knee/Hip Exercises: Aerobic   Nustep L3 x 10 min   Knee/Hip Exercises: Machines for Strengthening   Cybex Leg Press Seat 5 bil 40# 2 x10   Knee/Hip Exercises: Standing   Forward Step Up Left;2 sets;15 reps;Hand Hold: 1;Step Height: 6"   Cryotherapy   Number Minutes Cryotherapy 10 Minutes   Cryotherapy Location Knee   Type of Cryotherapy Ice pack   Ankle Exercises: Standing   Rebounder Weight shifting 1 min each way  VC for correct LE position in standing   Heel Raises 20 reps;2 seconds   Other Standing Ankle Exercises Resisted 25# 10 x each direction  VC to DF ankle                  PT Short Term Goals - 08/08/15 1410    PT SHORT TERM GOAL #4   Title ascend steps  with step over step gait with use of 1 rail   Time 4   Period Weeks   Status Achieved           PT Long Term Goals - 08/20/15 1506    PT LONG TERM GOAL #1   Title be independent in advanced HEP   Time 8   Period Weeks   Status On-going  Working on closed chain exercises               Plan - 08/20/15 1522    Clinical Impression Statement Pt doing well with concentration on LE strength in closed chain manner. Ice helping the knee per pt report. Added leg press machine today and increased weight on resisted walking.    Pt will benefit from skilled therapeutic intervention in order to improve on the following deficits Decreased strength;Decreased mobility;Pain;Decreased activity tolerance;Decreased endurance;Difficulty walking;Abnormal gait   Rehab Potential Good   PT Frequency 2x / week   PT Duration 8 weeks   PT Treatment/Interventions ADLs/Self Care Home Management;Cryotherapy;Electrical Stimulation;Moist Heat;Therapeutic exercise;Therapeutic  activities;Functional mobility training;Stair training;Gait training;Ultrasound;Neuromuscular re-education;Balance training;Patient/family education;Manual techniques;Taping;Dry needling;Passive range of motion   PT Next Visit Plan Continue with LT lower chain strengthening   Consulted and Agree with Plan of Care Patient        Problem List Patient Active Problem List   Diagnosis Date Noted  . Mass of ovary 07/12/2015  . History of laparoscopy 06/19/2015  . Left hip pain 05/24/2015  . Left knee pain 05/24/2015  . Left ankle pain 05/24/2015  . Chest wall pain 05/16/2015  . OSA (obstructive sleep apnea) 03/01/2015  . Leukocytosis 03/01/2015  . Leg weakness, bilateral 03/01/2015  . B12 deficiency 03/01/2015  . Allergic rhinitis 12/12/2014  . Dyspnea 12/12/2014  . SOB (shortness of breath) 10/30/2014  . Migraine 07/10/2014  . Hemorrhoid 07/10/2014  . Arthralgia 05/08/2014  . Tachycardia 03/29/2014  . Tinnitus 03/27/2014  . Fatigue 03/27/2014  . Back pain 03/27/2014  . Shingles 03/09/2014  . Fever 03/09/2014  . Hot flashes 03/09/2014  . Meningitis, viral 03/02/2014  . Headache 03/01/2014  . Myalgia and myositis 02/23/2014  . Headache(784.0) 02/23/2014  . Routine general medical examination at a health care facility 01/16/2014  . Psoriasis 01/16/2014  . Obesity (BMI 30-39.9) 01/16/2014  . Neck pain 01/01/2014  . Dysuria 10/11/2013  . Abnormal CBC 10/11/2013  . Watery eyes 10/11/2013  . Plantar fasciitis of left foot 03/18/2013  . IBS (irritable bowel syndrome) 03/18/2013  . Nausea alone 02/17/2013  . PLEURISY 11/10/2008  . GASTRITIS 08/10/2008  . ANXIETY 01/06/2008  . PROCTALGIA FUGAX 01/06/2008  . ABDOMINAL PAIN-MULTIPLE SITES 01/06/2008  . GERD 11/15/2007  . CONSTIPATION 11/15/2007  . Chest pain 11/15/2007  . OVARIAN CYST, LEFT 11/11/2007    Joandry Slagter, PTA 08/20/2015, 3:27 PM  [facility] [address], [place], [place], [address] Phone: [phone]   Fax:  [phone]  Name: Lorraine Conrad MRN: [id number] Date of Birth: 25-Feb-1971

## 2015-08-21 ENCOUNTER — Encounter: Payer: BLUE CROSS/BLUE SHIELD | Admitting: Podiatry

## 2015-08-21 NOTE — Progress Notes (Signed)
Patient was a no show for nail check for procedure done on 08/14/15.  This encounter was created in error - please disregard.

## 2015-08-22 ENCOUNTER — Encounter: Payer: BLUE CROSS/BLUE SHIELD | Admitting: Physical Therapy

## 2015-08-23 ENCOUNTER — Telehealth: Payer: Self-pay | Admitting: Medical

## 2015-08-23 ENCOUNTER — Other Ambulatory Visit: Payer: Self-pay

## 2015-08-23 ENCOUNTER — Encounter (HOSPITAL_BASED_OUTPATIENT_CLINIC_OR_DEPARTMENT_OTHER): Payer: Self-pay

## 2015-08-23 ENCOUNTER — Encounter: Payer: Self-pay | Admitting: Medical

## 2015-08-23 ENCOUNTER — Ambulatory Visit (INDEPENDENT_AMBULATORY_CARE_PROVIDER_SITE_OTHER): Payer: BLUE CROSS/BLUE SHIELD | Admitting: Medical

## 2015-08-23 ENCOUNTER — Emergency Department (HOSPITAL_BASED_OUTPATIENT_CLINIC_OR_DEPARTMENT_OTHER): Payer: BLUE CROSS/BLUE SHIELD

## 2015-08-23 ENCOUNTER — Emergency Department (HOSPITAL_BASED_OUTPATIENT_CLINIC_OR_DEPARTMENT_OTHER)
Admission: EM | Admit: 2015-08-23 | Discharge: 2015-08-23 | Disposition: A | Discharge status: Home / Self Care | Payer: BLUE CROSS/BLUE SHIELD | Attending: Physician Assistant | Admitting: Physician Assistant

## 2015-08-23 VITALS — BP 118/70 | HR 101 | Temp 98.1°F | Resp 16 | Ht 64.0 in | Wt 202.0 lb

## 2015-08-23 DIAGNOSIS — M940 Chondrocostal junction syndrome [Tietze]: Secondary | ICD-10-CM

## 2015-08-23 DIAGNOSIS — Z9981 Dependence on supplemental oxygen: Secondary | ICD-10-CM | POA: Insufficient documentation

## 2015-08-23 DIAGNOSIS — Z8742 Personal history of other diseases of the female genital tract: Secondary | ICD-10-CM | POA: Insufficient documentation

## 2015-08-23 DIAGNOSIS — M79609 Pain in unspecified limb: Secondary | ICD-10-CM

## 2015-08-23 DIAGNOSIS — Z862 Personal history of diseases of the blood and blood-forming organs and certain disorders involving the immune mechanism: Secondary | ICD-10-CM | POA: Insufficient documentation

## 2015-08-23 DIAGNOSIS — Z7951 Long term (current) use of inhaled steroids: Secondary | ICD-10-CM | POA: Diagnosis not present

## 2015-08-23 DIAGNOSIS — R079 Chest pain, unspecified: Secondary | ICD-10-CM

## 2015-08-23 DIAGNOSIS — R791 Abnormal coagulation profile: Secondary | ICD-10-CM | POA: Diagnosis not present

## 2015-08-23 DIAGNOSIS — R7989 Other specified abnormal findings of blood chemistry: Secondary | ICD-10-CM

## 2015-08-23 DIAGNOSIS — Z79899 Other long term (current) drug therapy: Secondary | ICD-10-CM | POA: Insufficient documentation

## 2015-08-23 DIAGNOSIS — R609 Edema, unspecified: Secondary | ICD-10-CM

## 2015-08-23 DIAGNOSIS — R5383 Other fatigue: Secondary | ICD-10-CM | POA: Insufficient documentation

## 2015-08-23 DIAGNOSIS — F419 Anxiety disorder, unspecified: Secondary | ICD-10-CM | POA: Insufficient documentation

## 2015-08-23 DIAGNOSIS — K219 Gastro-esophageal reflux disease without esophagitis: Secondary | ICD-10-CM | POA: Diagnosis not present

## 2015-08-23 DIAGNOSIS — G8929 Other chronic pain: Secondary | ICD-10-CM | POA: Diagnosis not present

## 2015-08-23 DIAGNOSIS — R6884 Jaw pain: Secondary | ICD-10-CM | POA: Diagnosis not present

## 2015-08-23 DIAGNOSIS — M25522 Pain in left elbow: Secondary | ICD-10-CM

## 2015-08-23 DIAGNOSIS — M1990 Unspecified osteoarthritis, unspecified site: Secondary | ICD-10-CM | POA: Insufficient documentation

## 2015-08-23 DIAGNOSIS — Z8679 Personal history of other diseases of the circulatory system: Secondary | ICD-10-CM | POA: Diagnosis not present

## 2015-08-23 DIAGNOSIS — M79622 Pain in left upper arm: Secondary | ICD-10-CM

## 2015-08-23 DIAGNOSIS — Z791 Long term (current) use of non-steroidal anti-inflammatories (NSAID): Secondary | ICD-10-CM | POA: Diagnosis not present

## 2015-08-23 DIAGNOSIS — G4733 Obstructive sleep apnea (adult) (pediatric): Secondary | ICD-10-CM | POA: Insufficient documentation

## 2015-08-23 LAB — COMPREHENSIVE METABOLIC PANEL
ALBUMIN: 4.3 g/dL (ref 3.5–5.0)
ALK PHOS: 68 U/L (ref 38–126)
ALT: 22 U/L (ref 14–54)
ANION GAP: 11 (ref 5–15)
AST: 21 U/L (ref 15–41)
BUN: 13 mg/dL (ref 6–20)
CHLORIDE: 103 mmol/L (ref 101–111)
CO2: 24 mmol/L (ref 22–32)
Calcium: 9.2 mg/dL (ref 8.9–10.3)
Creatinine, Ser: 0.89 mg/dL (ref 0.44–1.00)
GFR calc Af Amer: >60 mL/min (ref >60)
GFR calc non Af Amer: >60 mL/min (ref >60)
GLUCOSE: 85 mg/dL (ref 65–99)
POTASSIUM: 4 mmol/L (ref 3.5–5.1)
SODIUM: 138 mmol/L (ref 135–145)
Total Bilirubin: 0.6 mg/dL (ref 0.3–1.2)
Total Protein: 7.8 g/dL (ref 6.5–8.1)

## 2015-08-23 LAB — CBC WITH DIFFERENTIAL/PLATELET
BASOS PCT: 0 %
Basophils Absolute: 0 10*3/uL (ref 0.0–0.1)
EOS ABS: 0.2 10*3/uL (ref 0.0–0.7)
Eosinophils Relative: 2 %
HCT: 41.5 % (ref 36.0–46.0)
HEMOGLOBIN: 13.5 g/dL (ref 12.0–15.0)
Lymphocytes Relative: 22 %
Lymphs Abs: 1.5 10*3/uL (ref 0.7–4.0)
MCH: 28.1 pg (ref 26.0–34.0)
MCHC: 32.5 g/dL (ref 30.0–36.0)
MCV: 86.3 fL (ref 78.0–100.0)
MONOS PCT: 8 %
Monocytes Absolute: 0.5 10*3/uL (ref 0.1–1.0)
NEUTROS PCT: 68 %
Neutro Abs: 4.6 10*3/uL (ref 1.7–7.7)
PLATELETS: 337 10*3/uL (ref 150–400)
RBC: 4.81 MIL/uL (ref 3.87–5.11)
RDW: 14.1 % (ref 11.5–15.5)
WBC: 6.8 10*3/uL (ref 4.0–10.5)

## 2015-08-23 LAB — D-DIMER, QUANTITATIVE: D-Dimer, Quant: 0.58 ug/mL-FEU — ABNORMAL HIGH (ref 0.00–0.50)

## 2015-08-23 LAB — TROPONIN I: Troponin I: <0.03 ng/mL (ref <0.031)

## 2015-08-23 MED ORDER — ACETAMINOPHEN 325 MG PO TABS
650.0000 mg | ORAL_TABLET | Freq: Once | ORAL | Status: AC
  Administered 2015-08-23: 650 mg via ORAL
  Filled 2015-08-23: qty 2

## 2015-08-23 MED ORDER — IOHEXOL 350 MG/ML SOLN
100.0000 mL | Freq: Once | INTRAVENOUS | Status: AC | PRN
  Administered 2015-08-23: 100 mL via INTRAVENOUS

## 2015-08-23 MED ORDER — METHOCARBAMOL 500 MG PO TABS: 500.0000 mg | ORAL_TABLET | Freq: Four times a day (QID) | ORAL | Status: DC | PRN

## 2015-08-23 MED FILL — METHOCARBAMOL 500 MG TABLET: 500 | 5 days supply | Qty: 20 | Fill #0

## 2015-08-23 NOTE — Progress Notes (Signed)
Subjective:    Patient ID: Lorraine Conrad, female    DOB: 12-21-1970, 45 y.o.   MRN: [id number]  HPI  Pt in with Sunday had tenderness to palpation over lower sternum. This was the case for day. She still has some faint tenderness to touch now. Then she states pain felt like radiating toward her back. Pt states pain feel like it goes to her back the whole time. The yesterday and today some pain that would go down her left arm. Today some pain as well but not as bad as it was yesterday. On Tuesday pt had some faint jaw pain this past Tuesday   Pain level that went to her back almost caused her to cry on Tuesday.  Pt also mentioned some pain in  Lt medial calf area that comes at various times but none present now.  Some chest  pain increase chest walking up steps.  Dad had triple bypass at 58 yr old.   Last night some popliteal pain.      Review of Systems  Constitutional: Negative for fever, chills and fatigue.  HENT:       On Tuesday left jaw pain. Not present currently.  Respiratory: Negative for cough and shortness of breath.   Cardiovascular: Positive for chest pain. Negative for palpitations.  Gastrointestinal: Negative for abdominal pain.  Musculoskeletal: Negative for back pain.       Some left side popliteal region pain last night.  Skin: Negative for rash.  Neurological: Negative for dizziness and headaches.  Hematological: Negative for adenopathy. Does not bruise/bleed easily.  Psychiatric/Behavioral: Negative for behavioral problems and confusion.     Past Medical History  Diagnosis Date  . GERD (gastroesophageal reflux disease)   . Endometriosis   . Ovarian cyst, left   . External hemorrhoids   . Erosive gastritis   . Proctalgia fugax   . Allergy     SEASONAL  . Anemia   . OSA on CPAP   . Migraine 1998; 02/2014    "this one's lasted 10 days straight" (03/01/2014)  . Headache     "probably monthly" (03/01/2014)  . Arthritis     "spine" (03/01/2014)  .  Chronic lower back pain   . H/O shortness of breath 2014    Cardiopulmonary exercise test results    Social History   Social History  . Marital Status: Married    Spouse Name: N/A  . Number of Children: N/A  . Years of Education: N/A   Occupational History  . Not on file.   Social History Main Topics  . Smoking status: Never Smoker   . Smokeless tobacco: Never Used  . Alcohol Use: 0.0 oz/week    0 Standard drinks or equivalent per week     Comment: 03/01/2014 "I'll have 1-2 drinks maybe 3-4 times/yr"  . Drug Use: No  . Sexual Activity: Yes   Other Topics Concern  . Not on file   Social History Narrative   Daily caffiene use: 6 cups per day   Patient does not get regular excercise    Past Surgical History  Procedure Laterality Date  . Cesarean section  2002; 2006  . Temporomandibular joint surgery  ~ 1992  . Nasal septum surgery  ~ 1991  . Laparoscopic ovarian cystectomy  ~ 1999  . Upper gastrointestinal endoscopy    . Colonoscopy    . Abdominal hysterectomy  04/2013  . Breast biopsy Bilateral   . Breast lumpectomy Left ~ 1993  Family History  Problem Relation Age of Onset  . Breast cancer Maternal Grandmother   . Colon cancer Neg Hx   . Prostate cancer Paternal Grandfather   . Colon polyps Father   . Heart disease Father     Allergies  Allergen Reactions  . Promethazine Hcl Other (See Comments)    Violent tremors   . Cephalexin Diarrhea and Itching  . Codeine Other (See Comments)    Was a child when she took this.  . Dilaudid [Hydromorphone] Nausea And Vomiting  . Azithromycin Rash    Head to toe rash    Current Outpatient Prescriptions on File Prior to Visit  Medication Sig Dispense Refill  . albuterol (PROVENTIL HFA;VENTOLIN HFA) 108 (90 BASE) MCG/ACT inhaler Inhale 2 puffs into the lungs every 6 (six) hours as needed for wheezing or shortness of breath. 1 Inhaler 3  . azelastine (ASTELIN) 0.1 % nasal spray Place 2 sprays into both nostrils 2  (two) times daily. Use in each nostril as directed 30 mL 3  . beclomethasone (QVAR) 40 MCG/ACT inhaler Inhale 2 puffs into the lungs 2 (two) times daily at 10 AM and 5 PM. 1 Inhaler 3  . diclofenac (VOLTAREN) 75 MG EC tablet Take 1 tablet (75 mg total) by mouth 2 (two) times daily. 30 tablet 1  . lansoprazole (PREVACID) 30 MG capsule Take 30 mg by mouth daily at 12 noon.    . mometasone (NASONEX) 50 MCG/ACT nasal spray USE 2 SPRAYS IN EACH NOSTRIL EVERY DAY 17 g 1  . progesterone (PROMETRIUM) 100 MG capsule Take 100 mg by mouth daily.     No current facility-administered medications on file prior to visit.    BP 118/70 mmHg  Pulse 101  Temp(Src) 98.1 F (36.7 C) (Oral)  Resp 16  Ht 5\' 4"  (1.626 m)  Wt 202 lb (91.627 kg)  BMI 34.66 kg/m2  SpO2 97%  LMP 02/08/2013       Objective:   Physical Exam  General Mental Status- Alert. General Appearance- Not in acute distress.   Skin General: Color- Normal Color. Moisture- Normal Moisture.  Neck Carotid Arteries- Normal color. Moisture- Normal Moisture. No carotid bruits. No JVD.  Chest and Lung Exam Auscultation: Breath Sounds:-Normal.  Cardiovascular Auscultation:Rythm- Regular. Murmurs & Other Heart Sounds:Auscultation of the heart reveals- No Murmurs.  Abdomen Inspection:-Inspeection Normal. Palpation/Percussion:Note:No mass. Palpation and Percussion of the abdomen reveal- Non Tender, Non Distended + BS, no rebound or guarding.  Neurologic Cranial Nerve exam:- CN III-XII intact(No nystagmus), symmetric smile. Strength:- 5/5 equal and symmetric strength both upper and lower extremities.  Lower ext- no pedal edema and negative homans signs.  Anterior chest wall- pt does have costochondral junction tenderness to palpation.      Assessment & Plan:  ekg showed  Short pr interval. Looked the same as most recent.  With you current chest wall pain, back pain and arm pain will send you down to ED for work up. Best to  have your studies done immediately. Also you dad history of triple bypass is concern as well.  With pain behind knee last night you may benefit from D- dimer and leg ultrasound.  Follow up with [place] after ED department evaluation.  Note also you may have some costochondritis presently  But your various other symptoms cause concern for potential cardiac cause of pain.

## 2015-08-23 NOTE — Progress Notes (Signed)
Pre visit review using our clinic review tool, if applicable. No additional management support is needed unless otherwise documented below in the visit note. 

## 2015-08-23 NOTE — ED Notes (Signed)
Patient transported to Ultrasound 
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
## 2015-08-23 NOTE — Telephone Encounter (Signed)
Let pt know that I reviewed studies and all were negative. She has likley cartlidge inflammation in her chest wall.. Will you refill her diclofenac. See if this helps. Give [place] update on Monday if she feeling  some better?

## 2015-08-23 NOTE — Discharge Instructions (Signed)
We did not find any dangerous reason for your chest pain today. Your CT, EKG, labs, x-ray were all negative. Please follow-up with her primary care physician.   Costochondritis Costochondritis is a condition in which the tissue (cartilage) that connects your ribs with your breastbone (sternum) becomes irritated. It causes pain in the chest and rib area. It usually goes away on its own over time. HOME CARE  Avoid activities that wear you out.  Do not strain your ribs. Avoid activities that use your:  Chest.  Belly.  Side muscles.  Put ice on the area for the first 2 days after the pain starts.  Put ice in a plastic bag.  Place a towel between your skin and the bag.  Leave the ice on for 20 minutes, 2-3 times a day.  Only take medicine as told by your doctor. GET HELP IF:  You have redness or puffiness (swelling) in the rib area.  Your pain does not go away with rest or medicine. GET HELP RIGHT AWAY IF:   Your pain gets worse.  You are very uncomfortable.  You have trouble breathing.  You cough up blood.  You start sweating or throwing up (vomiting).  You have a fever or lasting symptoms for more than 2-3 days.  You have a fever and your symptoms suddenly get worse. MAKE SURE YOU:   Understand these instructions.  Will watch your condition.  Will get help right away if you are not doing well or get worse.   This information is not intended to replace advice given to you by your health care provider. Make sure you discuss any questions you have with your health care provider.   Document Released: 11/12/2007 Document Revised: 01/26/2013 Document Reviewed: 12/28/2012 Elsevier Interactive Patient Education [employer].

## 2015-08-23 NOTE — ED Notes (Signed)
CP since Sunday-sent from PCP in the building

## 2015-08-23 NOTE — Telephone Encounter (Signed)
Relation to [id number] Call back number: [phone]    Reason for call:  Patient just leaving the ED and would like PA to review chart and medication prescribed. Patient already scheduled for a follow Friday 08/31/15 at 9am

## 2015-08-23 NOTE — Patient Instructions (Addendum)
With you current chest wall pain, back pain and arm pain will send you down to ED for work up. Best to have your studies done immediately. Also you dad history of triple bypass is concern as well.  Also yesterday some transient sob episodes.  With pain behind knee last night you may benefit from D- dimer and leg ultrasound.  Follow up with [place] after ED department evaluation.  Note also you may have some costochondritis presently  But your various other symptoms cause concern for potential cardiac cause of pain.

## 2015-08-24 NOTE — Telephone Encounter (Signed)
Attempted to reach pt and VM is full.  

## 2015-08-27 ENCOUNTER — Encounter: Payer: Self-pay | Admitting: Family Medicine

## 2015-08-27 ENCOUNTER — Ambulatory Visit (INDEPENDENT_AMBULATORY_CARE_PROVIDER_SITE_OTHER): Payer: BLUE CROSS/BLUE SHIELD | Admitting: Family Medicine

## 2015-08-27 VITALS — BP 111/74 | HR 80 | Ht 64.0 in | Wt 205.0 lb

## 2015-08-27 DIAGNOSIS — M25572 Pain in left ankle and joints of left foot: Secondary | ICD-10-CM

## 2015-08-27 DIAGNOSIS — M25562 Pain in left knee: Secondary | ICD-10-CM

## 2015-08-27 DIAGNOSIS — M25552 Pain in left hip: Secondary | ICD-10-CM | POA: Diagnosis not present

## 2015-08-27 NOTE — Patient Instructions (Signed)
Your exam is still consistent with IT band syndrome/strain, posterior tibialis and extensor tendon strains, and medial knee contusion. Continue with physical therapy and home exercises. Avoid painful activities as much as possible. Ice over area of pain 3-4 times a day for 15 minutes at a time. Do home exercises on days you don't go to therapy. Aleve 2 tabs twice a day with food for pain and inflammation as needed. Follow up with me 6 weeks.

## 2015-08-28 ENCOUNTER — Ambulatory Visit: Payer: BLUE CROSS/BLUE SHIELD | Admitting: Medical

## 2015-08-28 ENCOUNTER — Ambulatory Visit (INDEPENDENT_AMBULATORY_CARE_PROVIDER_SITE_OTHER): Payer: BLUE CROSS/BLUE SHIELD | Admitting: Medical

## 2015-08-28 ENCOUNTER — Encounter: Payer: Self-pay | Admitting: Medical

## 2015-08-28 VITALS — BP 108/72 | HR 89 | Temp 98.1°F | Ht 64.0 in | Wt 204.8 lb

## 2015-08-28 DIAGNOSIS — M940 Chondrocostal junction syndrome [Tietze]: Secondary | ICD-10-CM

## 2015-08-28 MED ORDER — KETOROLAC TROMETHAMINE 60 MG/2ML IM SOLN
60.0000 mg | Freq: Once | INTRAMUSCULAR | Status: AC
  Administered 2015-08-28: 60 mg via INTRAMUSCULAR
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
## 2015-08-28 NOTE — Progress Notes (Signed)
Pre visit review using our clinic review tool, if applicable. No additional management support is needed unless otherwise documented below in the visit note. 

## 2015-08-28 NOTE — Patient Instructions (Addendum)
Your work up the other day was extensive and negative.You have chest wall pain directly on palpation presently. We gave you toradol 60 mg im today in office. Tomorrow restart diclofenac. No exercise until we advise it is ok. Please update [place] on Monday on how you are and then I will advise on exercise.  If you have worse type signs or symptoms then ED evaluation.  I am considering that if you have persisting pain past this Monday may refer you to cardiology. It appears 05-2104 you had stress test that was stopped mid testing. So I am considering referring you back to them but will see how you respond to the above treatment first.  Follow up this Monday or as needed

## 2015-08-29 ENCOUNTER — Encounter: Payer: Self-pay | Admitting: Physical Therapy

## 2015-08-29 ENCOUNTER — Ambulatory Visit: Payer: BLUE CROSS/BLUE SHIELD | Admitting: Physical Therapy

## 2015-08-29 ENCOUNTER — Telehealth: Payer: Self-pay | Admitting: *Deleted

## 2015-08-29 ENCOUNTER — Ambulatory Visit: Payer: BLUE CROSS/BLUE SHIELD | Admitting: Medical

## 2015-08-29 DIAGNOSIS — R29898 Other symptoms and signs involving the musculoskeletal system: Secondary | ICD-10-CM

## 2015-08-29 DIAGNOSIS — M25562 Pain in left knee: Secondary | ICD-10-CM

## 2015-08-29 DIAGNOSIS — M25572 Pain in left ankle and joints of left foot: Secondary | ICD-10-CM

## 2015-08-29 DIAGNOSIS — M25552 Pain in left hip: Secondary | ICD-10-CM

## 2015-08-29 DIAGNOSIS — R262 Difficulty in walking, not elsewhere classified: Secondary | ICD-10-CM

## 2015-08-29 NOTE — Telephone Encounter (Signed)
Pt states she missed her follow up appt last week due to illness, but is not sure how she is to take care of the toenail after the procedure, she didn't get instructions.  I spoke with pt and she said she had been doing betadine soaks for a while then a few days of the epsom salt soaks and the toe was red and still didn't look good, pt states she thinks she should be seen but can't be seen to the end of April.  I told pt to continue the epsom salt soaks and antibiotic dressing and transferred to be put on the nurses schedule.

## 2015-08-29 NOTE — Therapy (Signed)
[facility] [address] [place], [place], [address] Phone: [phone]   Fax:  [phone]  Physical Therapy Treatment  Patient Details  Name: Lorraine Conrad MRN: [id number] Date of Birth: 07/01/1970 Referring Provider: Karlton Lemon, MD  Encounter Date: 08/29/2015      PT End of Session - 08/29/15 1516    Visit Number 6   Date for PT Re-Evaluation 10/05/15   PT Start Time 1452  Pt late   PT Stop Time 1540   PT Time Calculation (min) 48 min   Activity Tolerance Patient tolerated treatment well   Behavior During Therapy [facility] for tasks assessed/performed      Past Medical History  Diagnosis Date  . GERD (gastroesophageal reflux disease)   . Endometriosis   . Ovarian cyst, left   . External hemorrhoids   . Erosive gastritis   . Proctalgia fugax   . Allergy     SEASONAL  . Anemia   . OSA on CPAP   . Migraine 1998; 02/2014    "this one's lasted 10 days straight" (03/01/2014)  . Headache     "probably monthly" (03/01/2014)  . Arthritis     "spine" (03/01/2014)  . Chronic lower back pain   . H/O shortness of breath 2014    Cardiopulmonary exercise test results    Past Surgical History  Procedure Laterality Date  . Cesarean section  2002; 2006  . Temporomandibular joint surgery  ~ 1992  . Nasal septum surgery  ~ 1991  . Laparoscopic ovarian cystectomy  ~ 1999  . Upper gastrointestinal endoscopy    . Colonoscopy    . Abdominal hysterectomy  04/2013  . Breast biopsy Bilateral   . Breast lumpectomy Left ~ 1993    There were no vitals filed for this visit.  Visit Diagnosis:  Hip pain, acute, left  Pain in joint, ankle and foot, left  Knee pain, acute, left  Weakness of left leg  Difficulty walking      Subjective Assessment - 08/29/15 1454    Subjective Have had some other health issues going on. Pt did not specifically comment.   Currently in Pain? Yes   Pain Score 3    Pain Location Knee    Pain Orientation Left;Lateral   Pain Descriptors / Indicators Sore   Aggravating Factors  Nothing specific   Pain Relieving Factors exercises   Multiple Pain Sites No                         [facility] Adult PT Treatment/Exercise - 08/29/15 0001    Knee/Hip Exercises: Aerobic   Nustep L3 x 10 min   Knee/Hip Exercises: Machines for Strengthening   Cybex Leg Press Seat 5, Bil 45# 2 x10    Knee/Hip Exercises: Standing   Forward Step Up Left;2 sets;15 reps;Hand Hold: 1;Step Height: 6"   SLS --  3 x10 sec   Cryotherapy   Number Minutes Cryotherapy 10 Minutes   Cryotherapy Location Knee   Type of Cryotherapy Ice pack   Ankle Exercises: Standing   Rebounder Weight shifting 1 min each way  VC for correct LE position in standing   Heel Raises 20 reps;2 seconds   Other Standing Ankle Exercises Resisted walking 25# 10 x each direction  VC to DF ankle                  PT Short Term Goals - 08/08/15 1410  PT SHORT TERM GOAL #4   Title ascend steps with step over step gait with use of 1 rail   Time 4   Period Weeks   Status Achieved           PT Long Term Goals - 08/29/15 1456    PT LONG TERM GOAL #1   Title be independent in advanced HEP   Time 8   Period Weeks   Status On-going   PT LONG TERM GOAL #2   Title reduce FOTO to < or = to 39% limitation   Time 8   Period Weeks   Status On-going   PT LONG TERM GOAL #5   Status On-going               Plan - 08/29/15 1500    Clinical Impression Statement Pt reports seeing her MD a few days ago. No major comments/concerns she reports from MD, stay the course with exercise until end of POC. Plans to return to MD in 6 weeks.    Pt will benefit from skilled therapeutic intervention in order to improve on the following deficits Decreased strength;Decreased mobility;Pain;Decreased activity tolerance;Decreased endurance;Difficulty walking;Abnormal gait   Rehab Potential Good   PT Frequency 2x / week    PT Duration 8 weeks   PT Treatment/Interventions ADLs/Self Care Home Management;Cryotherapy;Electrical Stimulation;Moist Heat;Therapeutic exercise;Therapeutic activities;Functional mobility training;Stair training;Gait training;Ultrasound;Neuromuscular re-education;Balance training;Patient/family education;Manual techniques;Taping;Dry needling;Passive range of motion   PT Next Visit Plan Continue with LT lower chain strengthening   Consulted and Agree with Plan of Care Patient        Problem List Patient Active Problem List   Diagnosis Date Noted  . Mass of ovary 07/12/2015  . History of laparoscopy 06/19/2015  . Left hip pain 05/24/2015  . Left knee pain 05/24/2015  . Left ankle pain 05/24/2015  . Chest wall pain 05/16/2015  . OSA (obstructive sleep apnea) 03/01/2015  . Leukocytosis 03/01/2015  . Leg weakness, bilateral 03/01/2015  . B12 deficiency 03/01/2015  . Allergic rhinitis 12/12/2014  . Dyspnea 12/12/2014  . SOB (shortness of breath) 10/30/2014  . Migraine 07/10/2014  . Hemorrhoid 07/10/2014  . Arthralgia 05/08/2014  . Tachycardia 03/29/2014  . Tinnitus 03/27/2014  . Fatigue 03/27/2014  . Back pain 03/27/2014  . Shingles 03/09/2014  . Fever 03/09/2014  . Hot flashes 03/09/2014  . Meningitis, viral 03/02/2014  . Headache 03/01/2014  . Myalgia and myositis 02/23/2014  . Headache(784.0) 02/23/2014  . Routine general medical examination at a health care facility 01/16/2014  . Psoriasis 01/16/2014  . Obesity (BMI 30-39.9) 01/16/2014  . Neck pain 01/01/2014  . Dysuria 10/11/2013  . Abnormal CBC 10/11/2013  . Watery eyes 10/11/2013  . Plantar fasciitis of left foot 03/18/2013  . IBS (irritable bowel syndrome) 03/18/2013  . Nausea alone 02/17/2013  . PLEURISY 11/10/2008  . GASTRITIS 08/10/2008  . ANXIETY 01/06/2008  . PROCTALGIA FUGAX 01/06/2008  . ABDOMINAL PAIN-MULTIPLE SITES 01/06/2008  . GERD 11/15/2007  . CONSTIPATION 11/15/2007  . Chest pain 11/15/2007   . OVARIAN CYST, LEFT 11/11/2007    Meyer Dockery, PTA 08/29/2015, 3:23 PM  [facility] [address], [place], [place], [address] Phone: [phone]   Fax:  [phone]  Name: Lorraine Conrad MRN: [id number] Date of Birth: 24-May-1971

## 2015-08-30 ENCOUNTER — Encounter: Payer: Self-pay | Admitting: Podiatry

## 2015-08-30 ENCOUNTER — Ambulatory Visit (INDEPENDENT_AMBULATORY_CARE_PROVIDER_SITE_OTHER): Payer: BLUE CROSS/BLUE SHIELD | Admitting: Podiatry

## 2015-08-30 DIAGNOSIS — L03032 Cellulitis of left toe: Secondary | ICD-10-CM

## 2015-08-30 NOTE — Assessment & Plan Note (Signed)
2/2 IT band strain.  Clinically improved though exam without a lot of change.  Continue with physical therapy, home exercises  Icing as needed, nsaids.  F/u in 6 weeks.

## 2015-08-30 NOTE — Assessment & Plan Note (Signed)
2/2 contusion.  Icing, physical therapy, aleve.  Consider MRI if still not improving at f/u.

## 2015-08-30 NOTE — Assessment & Plan Note (Signed)
2/2 posterior tibialis and extensor tendon strains.  PT, home exercises.  NSAIDs.  F/u in 6 weeks.
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
## 2015-08-31 ENCOUNTER — Ambulatory Visit: Payer: BLUE CROSS/BLUE SHIELD | Admitting: Medical

## 2015-08-31 ENCOUNTER — Ambulatory Visit: Payer: BLUE CROSS/BLUE SHIELD | Admitting: Physical Therapy

## 2015-08-31 ENCOUNTER — Encounter: Payer: Self-pay | Admitting: Physical Therapy

## 2015-08-31 ENCOUNTER — Telehealth: Payer: Self-pay | Admitting: Medical

## 2015-08-31 DIAGNOSIS — M25572 Pain in left ankle and joints of left foot: Secondary | ICD-10-CM

## 2015-08-31 DIAGNOSIS — M25552 Pain in left hip: Secondary | ICD-10-CM

## 2015-08-31 DIAGNOSIS — M25562 Pain in left knee: Secondary | ICD-10-CM

## 2015-08-31 DIAGNOSIS — R262 Difficulty in walking, not elsewhere classified: Secondary | ICD-10-CM

## 2015-08-31 DIAGNOSIS — R29898 Other symptoms and signs involving the musculoskeletal system: Secondary | ICD-10-CM

## 2015-08-31 NOTE — Telephone Encounter (Signed)
Edward please advise.  

## 2015-08-31 NOTE — Telephone Encounter (Signed)
I don't know how to answer this complaint. I don't have slots this afternoon. I would recommend evaluation at an urgent care. She needs physical exam to assess this complaint today.

## 2015-08-31 NOTE — Progress Notes (Signed)
She presents today for follow-up matrixectomy hallux left. She states that I guess it's okay that she was concerned that it was infected.  Objective: Vital signs are stable she's alert and oriented 3 there is some mild erythema and no cellulitis drainage or odor.  Assessment: Well-healing surgical toe mild paronychia possible but does not appear to be clinically infected.  Plan: I encouraged her to continue to soak Epsom salts and warm water and notify me if he does not completely resolve.

## 2015-08-31 NOTE — Telephone Encounter (Signed)
Caller name:Vonna Relationship to patient: Can be reached:[phone] Pharmacy:  Reason for call:SHE HAS HEAD PAIN  SHE FEELS LIKE SOMETHING IS MOVING IN HER HEAD. WHOLE LEFT SIDE OF HER FACE HURTS

## 2015-08-31 NOTE — Telephone Encounter (Signed)
Attempted to reach pt and VM is full, Will try home number. Attempted to reach pt on home and VM is not set up so unable to advise pt to go to urgent care.

## 2015-08-31 NOTE — Therapy (Signed)
[facility] [address] [place], [place], [address] Phone: [phone]   Fax:  [phone]  Physical Therapy Treatment  Patient Details  Name: Lorraine Conrad MRN: [id number] Date of Birth: [birth year] Referring Provider: Karlton Lemon, MD  Encounter Date: 08/31/2015      PT End of Session - 08/31/15 1025    Visit Number 7   Date for PT Re-Evaluation 10/05/15   PT Start Time 1021  Pt late and limited in what she can do today   PT Stop Time 1100   PT Time Calculation (min) 39 min   Activity Tolerance Patient tolerated treatment well   Behavior During Therapy Anxious      Past Medical History  Diagnosis Date  . GERD (gastroesophageal reflux disease)   . Endometriosis   . Ovarian cyst, left   . External hemorrhoids   . Erosive gastritis   . Proctalgia fugax   . Allergy     SEASONAL  . Anemia   . OSA on CPAP   . Migraine 1998; 02/2014    "this one's lasted 10 days straight" (03/01/2014)  . Headache     "probably monthly" (03/01/2014)  . Arthritis     "spine" (03/01/2014)  . Chronic lower back pain   . H/O shortness of breath 2014    Cardiopulmonary exercise test results    Past Surgical History  Procedure Laterality Date  . Cesarean section  2002; 2006  . Temporomandibular joint surgery  ~ 1992  . Nasal septum surgery  ~ 1991  . Laparoscopic ovarian cystectomy  ~ 1999  . Upper gastrointestinal endoscopy    . Colonoscopy    . Abdominal hysterectomy  04/2013  . Breast biopsy Bilateral   . Breast lumpectomy Left ~ 1993    There were no vitals filed for this visit.  Visit Diagnosis:  Hip pain, acute, left  Pain in joint, ankle and foot, left  Knee pain, acute, left  Weakness of left leg  Difficulty walking      Subjective Assessment - 08/31/15 1023    Subjective Reports not having a great morning regarding her new health issues. Continues to not want to be specific on this. Top of her LT  foot was sore after last week.    Currently in Pain? Yes   Pain Score 5    Pain Location Back   Pain Orientation Left   Pain Descriptors / Indicators Shooting;Aching   Multiple Pain Sites No                         [facility] Adult PT Treatment/Exercise - 08/31/15 0001    Lumbar Exercises: Supine   Ab Set 20 reps;2 seconds  Performed on moist heat to lumbar   Knee/Hip Exercises: Aerobic   Nustep L3 x 10 min   Knee/Hip Exercises: Standing   Forward Step Up Left;2 sets;15 reps;Hand Hold: 1;Step Height: 6"  Pt appeared in discomfort, asked her to stop, she finished   SLS --  3 x10 sec   Moist Heat Therapy   Number Minutes Moist Heat --  During supine ex then 15 min extra   Moist Heat Location Lumbar Spine   Cryotherapy   Number Minutes Cryotherapy 10 Minutes   Cryotherapy Location Knee   Type of Cryotherapy Ice pack   Ankle Exercises: Standing   Rebounder Weight shifting 1 min each way  VC for correct LE position in standing   Heel  Raises 20 reps;2 seconds       Declined resisted walking today.           PT Short Term Goals - 08/08/15 1410    PT SHORT TERM GOAL #4   Title ascend steps with step over step gait with use of 1 rail   Time 4   Period Weeks   Status Achieved           PT Long Term Goals - 08/29/15 1456    PT LONG TERM GOAL #1   Title be independent in advanced HEP   Time 8   Period Weeks   Status On-going   PT LONG TERM GOAL #2   Title reduce FOTO to < or = to 39% limitation   Time 8   Period Weeks   Status On-going   PT LONG TERM GOAL #5   Status On-going               Plan - 08/31/15 1026    Clinical Impression Statement Pt reports she is dealing with another health issue that she has chosen to keep to herself about what specifically she is experiencing. She does reports she was instructed by her MD to "take it easy." when asked if doing her exercises here in PT were too much, she replied they were fine. She was  continually asked how she feeling  giving her many opportunities to stop or rest.     Pt will benefit from skilled therapeutic intervention in order to improve on the following deficits Decreased strength;Decreased mobility;Pain;Decreased activity tolerance;Decreased endurance;Difficulty walking;Abnormal gait   Rehab Potential Good   PT Frequency 2x / week   PT Duration 8 weeks   PT Treatment/Interventions ADLs/Self Care Home Management;Cryotherapy;Electrical Stimulation;Moist Heat;Therapeutic exercise;Therapeutic activities;Functional mobility training;Stair training;Gait training;Ultrasound;Neuromuscular re-education;Balance training;Patient/family education;Manual techniques;Taping;Dry needling;Passive range of motion   PT Next Visit Plan Continue with LT lower chain strengthening; FOTO maybe for goals?    Consulted and Agree with Plan of Care Patient        Problem List Patient Active Problem List   Diagnosis Date Noted  . Mass of ovary 07/12/2015  . History of laparoscopy 06/19/2015  . Left hip pain 05/24/2015  . Left knee pain 05/24/2015  . Left ankle pain 05/24/2015  . Chest wall pain 05/16/2015  . OSA (obstructive sleep apnea) 03/01/2015  . Leukocytosis 03/01/2015  . Leg weakness, bilateral 03/01/2015  . B12 deficiency 03/01/2015  . Allergic rhinitis 12/12/2014  . Dyspnea 12/12/2014  . SOB (shortness of breath) 10/30/2014  . Migraine 07/10/2014  . Hemorrhoid 07/10/2014  . H/O: hysterectomy 07/05/2014  . Arthralgia 05/08/2014  . Tachycardia 03/29/2014  . Tinnitus 03/27/2014  . Fatigue 03/27/2014  . Back pain 03/27/2014  . Shingles 03/09/2014  . Fever 03/09/2014  . Hot flashes 03/09/2014  . Meningitis, viral 03/02/2014  . Headache 03/01/2014  . Myalgia and myositis 02/23/2014  . Headache(784.0) 02/23/2014  . Endometriosis 02/04/2014  . Routine general medical examination at a health care facility 01/16/2014  . Psoriasis 01/16/2014  . Obesity (BMI 30-39.9)  01/16/2014  . Neck pain 01/01/2014  . Dysuria 10/11/2013  . Abnormal CBC 10/11/2013  . Watery eyes 10/11/2013  . Plantar fasciitis of left foot 03/18/2013  . IBS (irritable bowel syndrome) 03/18/2013  . Nausea alone 02/17/2013  . PLEURISY 11/10/2008  . GASTRITIS 08/10/2008  . ANXIETY 01/06/2008  . PROCTALGIA FUGAX 01/06/2008  . ABDOMINAL PAIN-MULTIPLE SITES 01/06/2008  . GERD 11/15/2007  . CONSTIPATION 11/15/2007  .  Chest pain 11/15/2007  . OVARIAN CYST, LEFT 11/11/2007    Maximilliano Kersh, PTA 08/31/2015, 10:44 AM  [facility] [address] [place], [place], [address] Phone: [phone]   Fax:  [phone]  Name: Angelyne Conkling MRN: [id number] Date of Birth: [birth year]

## 2015-09-03 NOTE — Telephone Encounter (Signed)
Spoke with pt and she states that she is having some headaches and sinus pressure.She is coming in tomorrow before lunch for an appointment.

## 2015-09-04 ENCOUNTER — Encounter: Payer: Self-pay | Admitting: Medical

## 2015-09-04 ENCOUNTER — Ambulatory Visit (INDEPENDENT_AMBULATORY_CARE_PROVIDER_SITE_OTHER): Payer: BLUE CROSS/BLUE SHIELD | Admitting: Medical

## 2015-09-04 VITALS — BP 104/70 | HR 84 | Temp 98.4°F | Ht 64.0 in | Wt 202.0 lb

## 2015-09-04 DIAGNOSIS — R519 Headache, unspecified: Secondary | ICD-10-CM

## 2015-09-04 DIAGNOSIS — R51 Headache: Secondary | ICD-10-CM

## 2015-09-04 DIAGNOSIS — H9202 Otalgia, left ear: Secondary | ICD-10-CM | POA: Diagnosis not present

## 2015-09-04 DIAGNOSIS — R1033 Periumbilical pain: Secondary | ICD-10-CM | POA: Diagnosis not present

## 2015-09-04 DIAGNOSIS — N951 Menopausal and female climacteric states: Secondary | ICD-10-CM

## 2015-09-04 DIAGNOSIS — J0100 Acute maxillary sinusitis, unspecified: Secondary | ICD-10-CM

## 2015-09-04 DIAGNOSIS — R0789 Other chest pain: Secondary | ICD-10-CM

## 2015-09-04 DIAGNOSIS — R232 Flushing: Secondary | ICD-10-CM

## 2015-09-04 DIAGNOSIS — R5383 Other fatigue: Secondary | ICD-10-CM

## 2015-09-04 LAB — CBC WITH DIFFERENTIAL/PLATELET
BASOS PCT: 0.5 % (ref 0.0–3.0)
Basophils Absolute: 0 10*3/uL (ref 0.0–0.1)
EOS ABS: 0.2 10*3/uL (ref 0.0–0.7)
Eosinophils Relative: 2.9 % (ref 0.0–5.0)
HCT: 39.9 % (ref 36.0–46.0)
HEMOGLOBIN: 13.2 g/dL (ref 12.0–15.0)
LYMPHS ABS: 2 10*3/uL (ref 0.7–4.0)
Lymphocytes Relative: 23.1 % (ref 12.0–46.0)
MCHC: 33.1 g/dL (ref 30.0–36.0)
MCV: 84.1 fl (ref 78.0–100.0)
MONO ABS: 0.5 10*3/uL (ref 0.1–1.0)
Monocytes Relative: 6.3 % (ref 3.0–12.0)
Neutro Abs: 5.8 10*3/uL (ref 1.4–7.7)
Neutrophils Relative %: 67.2 % (ref 43.0–77.0)
Platelets: 467 10*3/uL — ABNORMAL HIGH (ref 150.0–400.0)
RBC: 4.74 Mil/uL (ref 3.87–5.11)
RDW: 14.4 % (ref 11.5–15.5)
WBC: 8.6 10*3/uL (ref 4.0–10.5)

## 2015-09-04 LAB — COMPREHENSIVE METABOLIC PANEL
ALBUMIN: 4.4 g/dL (ref 3.5–5.2)
ALK PHOS: 74 U/L (ref 39–117)
ALT: 17 U/L (ref 0–35)
AST: 15 U/L (ref 0–37)
BUN: 14 mg/dL (ref 6–23)
CALCIUM: 9.7 mg/dL (ref 8.4–10.5)
CHLORIDE: 105 meq/L (ref 96–112)
CO2: 26 mEq/L (ref 19–32)
Creatinine, Ser: 1.04 mg/dL (ref 0.40–1.20)
GFR: 61.43 mL/min (ref >60.00)
Glucose, Bld: 92 mg/dL (ref 70–99)
POTASSIUM: 4.5 meq/L (ref 3.5–5.1)
SODIUM: 140 meq/L (ref 135–145)
TOTAL PROTEIN: 7.5 g/dL (ref 6.0–8.3)
Total Bilirubin: 0.4 mg/dL (ref 0.2–1.2)

## 2015-09-04 LAB — POC URINALSYSI DIPSTICK (AUTOMATED)
Bilirubin, UA: NEGATIVE
GLUCOSE UA: NEGATIVE
Ketones, UA: NEGATIVE
Leukocytes, UA: NEGATIVE
NITRITE UA: NEGATIVE
PH UA: 6
Protein, UA: NEGATIVE
SPEC GRAV UA: 1.025
UROBILINOGEN UA: 0.2

## 2015-09-04 LAB — FOLLICLE STIMULATING HORMONE: FSH: 76.2 m[IU]/mL

## 2015-09-04 LAB — AMYLASE: AMYLASE: 40 U/L (ref 27–131)

## 2015-09-04 LAB — LIPASE: LIPASE: 19 U/L (ref 11.0–59.0)

## 2015-09-04 LAB — SEDIMENTATION RATE: SED RATE: 20 mm/h (ref 0–22)

## 2015-09-04 LAB — TSH: TSH: 1.85 u[IU]/mL (ref 0.35–4.50)

## 2015-09-04 MED ORDER — SULFAMETHOXAZOLE-TRIMETHOPRIM 800-160 MG PO TABS: 1.0000 | ORAL_TABLET | Freq: Two times a day (BID) | ORAL | Status: DC

## 2015-09-04 MED ORDER — NEOMYCIN-POLYMYXIN-HC 1 % OT SOLN: 3.0000 [drp] | Freq: Three times a day (TID) | OTIC | Status: DC

## 2015-09-04 NOTE — Patient Instructions (Addendum)
For possible sinus infection. Rx bactrim ds.  For ear pain rx cortisporin otic.  For recurrent temporal ha will get sed rate and see if elevated. If so then rx prednisone and would recommend getting temporal artery biospy as ENT recommended.  Try black cohash otc for hot flashes. Will get fsh today.  Will follow labs and ua. Then make decision on treatment on mild abdomen pain that started this am. If symptoms change or worsen notify us.  Follow up 2 weeks or as needed

## 2015-09-04 NOTE — Progress Notes (Signed)
Pre visit review using our clinic review tool, if applicable. No additional management support is needed unless otherwise documented below in the visit note. 

## 2015-09-04 NOTE — Progress Notes (Signed)
Subjective:    Patient ID: Lorraine Conrad, female    DOB: December 06, 1970, 45 y.o.   MRN: [id number]  HPI   Pt in for evaluation. She has had some recent sinus pressure sensation maxillary and frontal sinus pressure to palpation with pressure. Also teeth tender. She had some temporal area pain. Pt did see ENT wed or Thursday last week. Pt states ent was going to do temporal artery biopsy. But then pt declined the test. She was going to discuss this with her husband. But they have not made a decision.   Pt has some left ear pain on exam. Faint left temporal area pain today. No vision disturbance.  Pt feels like she is having hot flashes. Pt has not tried any meds otc. Pt has hysterectomy. Pt last ovary removed in January.       Review of Systems  Constitutional: Negative for chills and fatigue.  HENT: Positive for ear pain and sinus pressure.   Cardiovascular: Negative for chest pain and palpitations.  Gastrointestinal: Positive for abdominal pain.  Endocrine: Positive for heat intolerance.  Musculoskeletal: Positive for back pain.       Faint left sided lower back pain.  Neurological: Positive for headaches.       Faitn temproal pain.   Past Medical History  Diagnosis Date  . GERD (gastroesophageal reflux disease)   . Endometriosis   . Ovarian cyst, left   . External hemorrhoids   . Erosive gastritis   . Proctalgia fugax   . Allergy     SEASONAL  . Anemia   . OSA on CPAP   . Migraine 1998; 02/2014    "this one's lasted 10 days straight" (03/01/2014)  . Headache     "probably monthly" (03/01/2014)  . Arthritis     "spine" (03/01/2014)  . Chronic lower back pain   . H/O shortness of breath 2014    Cardiopulmonary exercise test results    Social History   Social History  . Marital Status: Married    Spouse Name: N/A  . Number of Children: N/A  . Years of Education: N/A   Occupational History  . Not on file.   Social History Main Topics  . Smoking status: Never  Smoker   . Smokeless tobacco: Never Used  . Alcohol Use: 0.0 oz/week    0 Standard drinks or equivalent per week     Comment: 03/01/2014 "I'll have 1-2 drinks maybe 3-4 times/yr"  . Drug Use: No  . Sexual Activity: Yes    Birth Control/ Protection: Surgical   Other Topics Concern  . Not on file   Social History Narrative   Daily caffiene use: 6 cups per day   Patient does not get regular excercise    Past Surgical History  Procedure Laterality Date  . Cesarean section  2002; 2006  . Temporomandibular joint surgery  ~ 1992  . Nasal septum surgery  ~ 1991  . Laparoscopic ovarian cystectomy  ~ 1999  . Upper gastrointestinal endoscopy    . Colonoscopy    . Abdominal hysterectomy  04/2013  . Breast biopsy Bilateral   . Breast lumpectomy Left ~ 1993    Family History  Problem Relation Age of Onset  . Breast cancer Maternal Grandmother   . Colon cancer Neg Hx   . Prostate cancer Paternal Grandfather   . Colon polyps Father   . Heart disease Father     Allergies  Allergen Reactions  . Promethazine Hcl Other (See Comments)  Violent tremors   . Cephalexin Diarrhea and Itching  . Codeine Other (See Comments)    Was a child when she took this.  . Dilaudid [Hydromorphone] Nausea And Vomiting  . Azithromycin Rash    Head to toe rash    Current Outpatient Prescriptions on File Prior to Visit  Medication Sig Dispense Refill  . albuterol (PROVENTIL HFA;VENTOLIN HFA) 108 (90 BASE) MCG/ACT inhaler Inhale 2 puffs into the lungs every 6 (six) hours as needed for wheezing or shortness of breath. 1 Inhaler 3  . azelastine (ASTELIN) 0.1 % nasal spray Place 2 sprays into both nostrils 2 (two) times daily. Use in each nostril as directed 30 mL 3  . beclomethasone (QVAR) 40 MCG/ACT inhaler Inhale 2 puffs into the lungs 2 (two) times daily at 10 AM and 5 PM. 1 Inhaler 3  . diclofenac (VOLTAREN) 75 MG EC tablet Take 1 tablet (75 mg total) by mouth 2 (two) times daily. 30 tablet 1  .  lansoprazole (PREVACID) 30 MG capsule Take 30 mg by mouth daily at 12 noon.    . methocarbamol (ROBAXIN) 500 MG tablet Take 1 tablet (500 mg total) by mouth every 6 (six) hours as needed for muscle spasms. 20 tablet 0  . mometasone (NASONEX) 50 MCG/ACT nasal spray USE 2 SPRAYS IN EACH NOSTRIL EVERY DAY 17 g 1  . progesterone (PROMETRIUM) 100 MG capsule Take 100 mg by mouth daily.     No current facility-administered medications on file prior to visit.    BP 104/70 mmHg  Pulse 84  Temp(Src) 98.4 F (36.9 C) (Oral)  Ht 5\' 4"  (1.626 m)  Wt 202 lb (91.627 kg)  BMI 34.66 kg/m2  SpO2 98%  LMP 02/08/2013       Objective:   Physical Exam  General  Mental Status - Alert. General Appearance - Well groomed. Not in acute distress.  Skin Rashes- No Rashes. Lt temporal area mild tender to palpation.  HEENT Head- Normal. Ear Auditory Canal - Left- Normal. Right - Normal.Tympanic Membrane- Left- tragal tenderness, insertion on otoscope exam tender but tm normal.  Right- Normal. Eye Sclera/Conjunctiva- Left- Normal. Right- Normal. Nose & Sinuses Nasal Mucosa- Left-  Boggy and Congested. Right-  Boggy and  Congested.lt  maxillary but no frontal sinus pressure. Mouth & Throat Lips: Upper Lip- Normal: no dryness, cracking, pallor, cyanosis, or vesicular eruption. Lower Lip-Normal: no dryness, cracking, pallor, cyanosis or vesicular eruption. Buccal Mucosa- Bilateral- No Aphthous ulcers. Oropharynx- No Discharge or Erythema. Tonsils: Characteristics- Bilateral- No Erythema or Congestion. Size/Enlargement- Bilateral- No enlargement. Discharge- bilateral-None.  Neck Neck- Supple. No Masses.   Chest and Lung Exam Auscultation: Breath Sounds:-Clear even and unlabored.  Cardiovascular Auscultation:Rythm- Regular, rate and rhythm. Murmurs & Other Heart Sounds:Ausculatation of the heart reveal- No Murmurs.  Lymphatic Head & Neck General Head & Neck Lymphatics: Bilateral: Description- No  Localized lymphadenopathy.  General Mental Status- Alert. General Appearance- Not in acute distress.   Skin General: Color- Normal Color. Moisture- Normal Moisture.  Neck Carotid Arteries- Normal color. Moisture- Normal Moisture. No carotid bruits. No JVD.  Chest and Lung Exam Auscultation: Breath Sounds:-Normal.  Cardiovascular Auscultation:Rythm- Regular. Murmurs & Other Heart Sounds:Auscultation of the heart reveals- No Murmurs.  Abdomen Inspection:-Inspeection Normal. Palpation/Percussion:Note:No mass. Palpation and Percussion of the abdomen reveal- Non Tender, Non Distended + BS, no rebound or guarding.    Neurologic Cranial Nerve exam:- CN III-XII intact(No nystagmus), symmetric smile. Strength:- 5/5 equal and symmetric strength both upper and lower extremities.  Assessment & Plan:  For possible sinus infection. Rx bactrim ds.  For ear pain rx cortisporin otic.  For recurrent temporal ha will get sed rate and see if elevated. If so then rx prednisone and would recommend getting temporal artery biospy as ENT recommended.  Try black cohash otc for hot flashes. Will get fsh today.  At end of exam she also mentioned faint abdomen pain today. Will get ua and culture today. Also cbc, cmp, amylase and lipase. I advsied her to take her ppi and zantac. Follow labs and notify us if symptoms.  She did have some blood in urine but no presentation likely kidney stone pain. So she will repeat in 2 wks. If her symptoms change or worsen more kidney stone like then will get ct abd/pelvis.  Then at very end she asked me about her piror intermittent chest pain that see costocondritis like. Still hurts intermittent with activity. But not presently. She has had numerous work up in past. Neg ekg and neg troponin and neg ct chest. Pt did have to stop stress test in middle in the past. So i am going to refer her back to cardiologist for stress test if they agree. Before then if pain  returns or severe then ED evaluation.  Follow up 2 weeks or as needed

## 2015-09-05 LAB — URINE CULTURE

## 2015-09-06 ENCOUNTER — Ambulatory Visit: Payer: BLUE CROSS/BLUE SHIELD | Admitting: Podiatry

## 2015-09-07 ENCOUNTER — Ambulatory Visit: Payer: BLUE CROSS/BLUE SHIELD | Admitting: Physical Therapy

## 2015-09-10 ENCOUNTER — Ambulatory Visit (INDEPENDENT_AMBULATORY_CARE_PROVIDER_SITE_OTHER): Payer: BLUE CROSS/BLUE SHIELD | Admitting: Cardiology

## 2015-09-10 ENCOUNTER — Encounter: Payer: Self-pay | Admitting: Cardiology

## 2015-09-10 ENCOUNTER — Telehealth: Payer: Self-pay | Admitting: *Deleted

## 2015-09-10 VITALS — BP 113/80 | HR 80 | Ht 64.0 in | Wt 206.0 lb

## 2015-09-10 DIAGNOSIS — Z6835 Body mass index (BMI) 35.0-35.9, adult: Secondary | ICD-10-CM | POA: Diagnosis not present

## 2015-09-10 DIAGNOSIS — R0789 Other chest pain: Secondary | ICD-10-CM

## 2015-09-10 DIAGNOSIS — R0600 Dyspnea, unspecified: Secondary | ICD-10-CM | POA: Diagnosis not present

## 2015-09-10 DIAGNOSIS — F419 Anxiety disorder, unspecified: Secondary | ICD-10-CM | POA: Diagnosis not present

## 2015-09-10 NOTE — Patient Instructions (Addendum)
Medication Instructions:  Your physician recommends that you continue on your current medications as directed. Please refer to the Current Medication list given to you today.   Labwork: NONE  Testing/Procedures: Your physician has requested that you have an echocardiogram. Echocardiography is a painless test that uses sound waves to create images of your heart. It provides your doctor with information about the size and shape of your heart and how well your heart's chambers and valves are working. This procedure takes approximately one hour. There are no restrictions for this procedure.  Your physician has requested that you have en exercise stress myoview. For further information please visit [email or web address]. Please follow instruction sheet, as given.   Follow-Up: Your physician recommends that you schedule a follow-up appointment in:  AS NEEDED  PENDING  TEST  RESULTS   Any Other Special Instructions Will Be Listed Below (If Applicable).     If you need a refill on your cardiac medications before your next appointment, please call your pharmacy.
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
## 2015-09-10 NOTE — Telephone Encounter (Addendum)
Dr. Milinda Pointer reviewed 08/14/2015 fungal culture result as +, and request pt make an appt.  Unable to leave a message of mobile mailbox is full, home phone rang for over 1 minute without answering device.  09/14/2015-Informed pt of Dr. Stephenie Acres orders and transferred to schedulers.

## 2015-09-11 ENCOUNTER — Ambulatory Visit: Payer: BLUE CROSS/BLUE SHIELD

## 2015-09-11 ENCOUNTER — Telehealth: Payer: Self-pay | Admitting: Cardiology

## 2015-09-11 ENCOUNTER — Telehealth (HOSPITAL_COMMUNITY): Payer: Self-pay | Admitting: *Deleted

## 2015-09-11 NOTE — Telephone Encounter (Signed)
F/u  Pt returning Rn phone call for instructions for myoview- Please call back and discuss.

## 2015-09-11 NOTE — Telephone Encounter (Signed)
Patient attempted to contact for instructions for nuclear test. No answer on cell and voice mailbox full. No answer on home # and no answer machine.Lorraine Conrad, [name]

## 2015-09-12 ENCOUNTER — Telehealth (HOSPITAL_COMMUNITY): Payer: Self-pay | Admitting: Radiology

## 2015-09-12 NOTE — Telephone Encounter (Signed)
Patient given detailed instructions per Myocardial Perfusion Study Information Sheet for the test on 09/13/2015 at 7:30. Patient notified to arrive 15 minutes early and that it is imperative to arrive on time for appointment to keep from having the test rescheduled.  If you need to cancel or reschedule your appointment, please call the office within 24 hours of your appointment. Failure to do so may result in a cancellation of your appointment, and a $50 no show fee. Patient verbalized understanding.EHK

## 2015-09-13 ENCOUNTER — Ambulatory Visit (HOSPITAL_COMMUNITY): Payer: BLUE CROSS/BLUE SHIELD | Attending: Cardiology

## 2015-09-13 DIAGNOSIS — R0609 Other forms of dyspnea: Secondary | ICD-10-CM | POA: Insufficient documentation

## 2015-09-13 DIAGNOSIS — Z8249 Family history of ischemic heart disease and other diseases of the circulatory system: Secondary | ICD-10-CM | POA: Insufficient documentation

## 2015-09-13 DIAGNOSIS — R0789 Other chest pain: Secondary | ICD-10-CM | POA: Diagnosis not present

## 2015-09-13 DIAGNOSIS — R0600 Dyspnea, unspecified: Secondary | ICD-10-CM | POA: Diagnosis not present

## 2015-09-13 LAB — MYOCARDIAL PERFUSION IMAGING
CHL CUP NUCLEAR SDS: 3
CHL CUP NUCLEAR SRS: 7
CHL CUP RESTING HR STRESS: 77 {beats}/min
CSEPHR: 93 %
CSEPPHR: 166 {beats}/min
Estimated workload: 10.4 METS
Exercise duration (min): 9 min
Exercise duration (sec): 0 s
LVDIAVOL: 70 mL (ref 46–106)
LVSYSVOL: 24 mL
MPHR: 177 {beats}/min
NUC STRESS TID: 0.93
RATE: 0.33
SSS: 10

## 2015-09-13 MED ORDER — TECHNETIUM TC 99M SESTAMIBI GENERIC - CARDIOLITE
10.7000 | Freq: Once | INTRAVENOUS | Status: AC | PRN
  Administered 2015-09-13: 11 via INTRAVENOUS

## 2015-09-13 MED ORDER — TECHNETIUM TC 99M SESTAMIBI GENERIC - CARDIOLITE
32.6000 | Freq: Once | INTRAVENOUS | Status: AC | PRN
  Administered 2015-09-13: 33 via INTRAVENOUS

## 2015-09-14 ENCOUNTER — Encounter: Payer: BLUE CROSS/BLUE SHIELD | Admitting: Physical Therapy

## 2015-09-14 ENCOUNTER — Ambulatory Visit: Payer: BLUE CROSS/BLUE SHIELD | Admitting: Medical

## 2015-09-14 ENCOUNTER — Encounter: Payer: Self-pay | Admitting: Podiatry

## 2015-09-18 ENCOUNTER — Ambulatory Visit: Payer: BLUE CROSS/BLUE SHIELD | Admitting: Medical

## 2015-09-24 ENCOUNTER — Telehealth: Payer: Self-pay | Admitting: Medical

## 2015-09-24 NOTE — Telephone Encounter (Signed)
I ordered US in march and negative. I want her to ask cardiologist directly after she gets stress test if they think it is safe. If they think so then she should be good to go. I am referring for this purpose.

## 2015-09-24 NOTE — Telephone Encounter (Signed)
Please advise 

## 2015-09-24 NOTE — Telephone Encounter (Signed)
Caller name: Self  Can be reached: [phone] ( Pharmacy:  Reason for call: Patient wants to know if she can resume working out once results of Stress Test and [place] are completed

## 2015-09-25 ENCOUNTER — Ambulatory Visit: Payer: BLUE CROSS/BLUE SHIELD | Admitting: Medical

## 2015-09-25 ENCOUNTER — Encounter: Payer: Self-pay | Admitting: Podiatry

## 2015-09-25 ENCOUNTER — Ambulatory Visit (INDEPENDENT_AMBULATORY_CARE_PROVIDER_SITE_OTHER): Payer: BLUE CROSS/BLUE SHIELD | Admitting: Podiatry

## 2015-09-25 DIAGNOSIS — L603 Nail dystrophy: Secondary | ICD-10-CM | POA: Diagnosis not present

## 2015-09-25 DIAGNOSIS — Z79899 Other long term (current) drug therapy: Secondary | ICD-10-CM | POA: Diagnosis not present

## 2015-09-25 MED ORDER — TERBINAFINE HCL 250 MG PO TABS: 250.0000 mg | ORAL_TABLET | Freq: Every day | ORAL | Status: DC

## 2015-09-25 NOTE — Telephone Encounter (Addendum)
Spoke with pt and she voices understanding that she will need to talk to the cardiologist after they complete the stress test and the cardiologist would clear the patient after that. Pt also advised Dessie Coma CMA that her OBGYN took her off the progesterone due to that doctor stating that the medication could lead to the patient having hardened arteries. She wanted Mackie Pai to be aware of this medication change.
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
## 2015-09-25 NOTE — Addendum Note (Signed)
Addended by: Tasia Catchings on: 09/25/2015 01:20 PM   Modules accepted: Orders, Medications

## 2015-09-25 NOTE — Patient Instructions (Signed)

## 2015-09-26 ENCOUNTER — Ambulatory Visit: Payer: BLUE CROSS/BLUE SHIELD | Attending: Family Medicine

## 2015-09-26 DIAGNOSIS — M25572 Pain in left ankle and joints of left foot: Secondary | ICD-10-CM | POA: Diagnosis not present

## 2015-09-26 DIAGNOSIS — M25552 Pain in left hip: Secondary | ICD-10-CM | POA: Diagnosis not present

## 2015-09-26 DIAGNOSIS — M6281 Muscle weakness (generalized): Secondary | ICD-10-CM | POA: Diagnosis not present

## 2015-09-26 DIAGNOSIS — M25562 Pain in left knee: Secondary | ICD-10-CM | POA: Diagnosis not present

## 2015-09-26 NOTE — Therapy (Signed)
[facility] [address] [place], [place], [address] Phone: [phone]   Fax:  [phone]  Physical Therapy Treatment  Patient Details  Name: Lorraine Conrad MRN: [id number] Date of Birth: 12/26/1970 Referring Provider: Karlton Lemon, MD  Encounter Date: 09/26/2015      PT End of Session - 09/26/15 1315    Visit Number 8   Date for PT Re-Evaluation 10/26/15   PT Start Time 1248   PT Stop Time 1317   PT Time Calculation (min) 29 min   Activity Tolerance Patient tolerated treatment well   Behavior During Therapy [facility] for tasks assessed/performed      Past Medical History  Diagnosis Date  . GERD (gastroesophageal reflux disease)   . Endometriosis   . Ovarian cyst, left   . External hemorrhoids   . Erosive gastritis   . Proctalgia fugax   . Allergy     SEASONAL  . Anemia   . OSA on CPAP   . Migraine 1998; 02/2014    "this one's lasted 10 days straight" (03/01/2014)  . Headache     "probably monthly" (03/01/2014)  . Arthritis     "spine" (03/01/2014)  . Chronic lower back pain   . H/O shortness of breath 2014    Cardiopulmonary exercise test results    Past Surgical History  Procedure Laterality Date  . Cesarean section  2002; 2006  . Temporomandibular joint surgery  ~ 1992  . Nasal septum surgery  ~ 1991  . Laparoscopic ovarian cystectomy  ~ 1999  . Upper gastrointestinal endoscopy    . Colonoscopy    . Abdominal hysterectomy  04/2013  . Breast biopsy Bilateral   . Breast lumpectomy Left ~ 1993    There were no vitals filed for this visit.      Subjective Assessment - 09/26/15 1250    Subjective 17 minutes late for appt.  Pt reports 2 recent episodes of Lt ankle "giving out"   Currently in Pain? Yes   Pain Score 4    Pain Location Hip  and Lt ankle   Pain Orientation Left   Pain Descriptors / Indicators Shooting;Aching   Pain Type Acute pain   Pain Onset More than a month ago   Pain  Frequency Constant   Aggravating Factors  not sure   Pain Relieving Factors rest, not sure            [facility] PT Assessment - 09/26/15 0001    Assessment   Medical Diagnosis Hip pain, left (M25.552), strained IT band, posterior tibialis and extensor tendons of the Lt ankle and Lt knee contusion   Onset Date/Surgical Date 04/12/15   Precautions   Precautions Anterior Hip   Prior Function   Level of Independence Independent   Cognition   Overall Cognitive Status Within Functional Limits for tasks assessed   Observation/Other Assessments   Focus on Therapeutic Outcomes (FOTO)  43% limitation   Strength   Overall Strength Deficits   Strength Assessment Site Knee;Ankle;Hip   Left Hip Flexion 4/5   Left Hip ABduction 4/5   Left Knee Flexion 4+/5   Left Knee Extension 4+/5   Left Ankle Dorsiflexion 4+/5   Left Ankle Inversion 4-/5   Left Ankle Eversion 4/5                     [facility] Adult PT Treatment/Exercise - 09/26/15 0001    Knee/Hip Exercises: Aerobic   Nustep L3 x 8  min   Knee/Hip Exercises: Standing   Forward Step Up Left;2 sets;15 reps;Hand Hold: 1;Step Height: 6"   Ankle Exercises: Standing   SLS 5x10 seconds   Heel Raises 20 reps;2 seconds                PT Education - 09/26/15 1313    Education provided Yes   Education Details issued handout with HEP previously issued   Person(s) Educated Patient   Methods Explanation;Handout   Comprehension Verbalized understanding;Returned demonstration          PT Short Term Goals - 08/08/15 1410    PT SHORT TERM GOAL #4   Title ascend steps with step over step gait with use of 1 rail   Time 4   Period Weeks   Status Achieved           PT Long Term Goals - 09/26/15 1256    PT LONG TERM GOAL #1   Title be independent in advanced HEP   Time 4   Period Weeks   Status On-going   PT LONG TERM GOAL #2   Title reduce FOTO to < or = to 39% limitation   Time 4   Period Weeks   Status  On-going  43% limitation   PT LONG TERM GOAL #3   Title wean from boot for all distances and demonstrate symmetry with ambulation   Status Achieved   PT LONG TERM GOAL #4   Title demonstrate 4+/5 to 5/5 Lt LE strength to improve endurance for independent grocery shopping   Time 4   Period Weeks   Status On-going   PT LONG TERM GOAL #5   Title report a 70% reduction in Lt LE pain with standing and walking   Time 4   Period Weeks   Status On-going  45% reduction   Additional Long Term Goals   Additional Long Term Goals Yes   PT LONG TERM GOAL #6   Title imrpove Lt ankle strength to report no episodes of Lt ankle instability with gait on unlevel surfaces   Time 4   Period Weeks   Status New   PT LONG TERM GOAL #7   Title improve LE endurance to allow for walking > or = to 45 minutes in the community without limitation   Time 4   Period Weeks   Status New               Plan - 09/26/15 1304    Clinical Impression Statement Pt with limited attendance with PT with lapse in treatment after3/24/17.  Pt has not been performing any HEP for strength since last visit.  Pt has been encouraged to perform more consistently at home for gains in strength and endurance.  Walking is limited to 30 minutes in the community and Lt ankle feels weak and gives way with mobility in the community at times.  Pt demonstrates weakness in the Lt LE throughout.  Pt with intermittent pain reported with standing and walking.  Pt will benefit from skilled PT for LE strength, endurance and stability progression to allow for return to safety in the community and more tolerance for home tasks.     Rehab Potential Good   PT Frequency 2x / week   PT Duration 4 weeks   PT Treatment/Interventions ADLs/Self Care Home Management;Cryotherapy;Electrical Stimulation;Moist Heat;Therapeutic exercise;Therapeutic activities;Functional mobility training;Stair training;Gait training;Ultrasound;Neuromuscular  re-education;Balance training;Patient/family education;Manual techniques;Taping;Dry needling;Passive range of motion   PT Next Visit Plan Lt LE strength and  endurance progression.  review HEP again to be sure she is compliant   Consulted and Agree with Plan of Care Patient      Patient will benefit from skilled therapeutic intervention in order to improve the following deficits and impairments:  Decreased strength, Decreased mobility, Pain, Decreased activity tolerance, Decreased endurance, Difficulty walking, Abnormal gait  Visit Diagnosis: Pain in left hip - Plan: PT plan of care cert/re-cert  Pain in left ankle and joints of left foot - Plan: PT plan of care cert/re-cert  Muscle weakness (generalized) - Plan: PT plan of care cert/re-cert     Problem List Patient Active Problem List   Diagnosis Date Noted  . Mass of ovary 07/12/2015  . History of laparoscopy 06/19/2015  . Left hip pain 05/24/2015  . Left knee pain 05/24/2015  . Left ankle pain 05/24/2015  . Chest wall pain 05/16/2015  . OSA (obstructive sleep apnea) 03/01/2015  . Leukocytosis 03/01/2015  . Leg weakness, bilateral 03/01/2015  . B12 deficiency 03/01/2015  . Allergic rhinitis 12/12/2014  . Dyspnea 12/12/2014  . SOB (shortness of breath) 10/30/2014  . Migraine 07/10/2014  . Hemorrhoid 07/10/2014  . H/O: hysterectomy 07/05/2014  . Arthralgia 05/08/2014  . Tachycardia 03/29/2014  . Tinnitus 03/27/2014  . Fatigue 03/27/2014  . Back pain 03/27/2014  . Shingles 03/09/2014  . Fever 03/09/2014  . Hot flashes 03/09/2014  . Meningitis, viral 03/02/2014  . Headache 03/01/2014  . Myalgia and myositis 02/23/2014  . Headache(784.0) 02/23/2014  . Endometriosis 02/04/2014  . Routine general medical examination at a health care facility 01/16/2014  . Psoriasis 01/16/2014  . Obesity (BMI 30-39.9) 01/16/2014  . Neck pain 01/01/2014  . Dysuria 10/11/2013  . Abnormal CBC 10/11/2013  . Watery eyes 10/11/2013  .  Plantar fasciitis of left foot 03/18/2013  . IBS (irritable bowel syndrome) 03/18/2013  . Nausea alone 02/17/2013  . PLEURISY 11/10/2008  . GASTRITIS 08/10/2008  . ANXIETY 01/06/2008  . PROCTALGIA FUGAX 01/06/2008  . ABDOMINAL PAIN-MULTIPLE SITES 01/06/2008  . GERD 11/15/2007  . CONSTIPATION 11/15/2007  . Chest pain 11/15/2007  . OVARIAN CYST, LEFT 11/11/2007   Sigurd Sos, PT 09/26/2015 1:18 PM  [facility] [address], [place], [place], [address] Phone: [phone]   Fax:  [phone]  Name: Lorraine Conrad MRN: [id number] Date of Birth: May 30, 1971

## 2015-09-26 NOTE — Patient Instructions (Signed)
Single Leg Balance: Eyes Open    Stand on right leg with eyes open. Hold _10__ seconds. _5__ reps _2__ times per day.  http://ggbe.exer.us/5   Copyright  VHI. All rights reserved.  Heel Raises    Stand with support. Tighten pelvic floor and hold. With knees straight, raise heels off ground. Repeat _2x10__ times. Do _2__ times a day.  Copyright  VHI. All rights reserved.  Anterior Step-Up    Stand with ___ inch step placed in A direction. Step onto step with right foot without pushing off with the ground foot. Finish with ground foot in touch balance on step and return _10__ times. _2__ sets _2__ times per day.  http://gglj.exer.us/161   Copyright  VHI. All rights reserved.  Hip Flexion / Knee Extension: Straight-Leg Raise (Eccentric)   Lie on back. Lift leg with knee straight. Slowly lower leg for 3-5 seconds. ___ reps per set, ___ sets per day, ___ days per week. Lower like elevator, stopping at each floor. Add ___ lbs when you achieve ___ repetitions. Rest on elbows. Rest on straight arms.  ABDUCTION: Side-Lying (Active)   Lie on left side, top leg straight. Raise top leg as far as possible. Use ___ lbs. Complete ___ sets of ___ repetitions. Perform ___ sessions per day.  http://gtsc.exer.us/94   (Home) Extension: Hip   With support under abdomen, tighten stomach. Lift right leg in line with body. Do not hyperextend. Alternate legs. Repeat ____ times per set. Do ____ sets per session. Do ____ sessions per week.  [facility] [address] [place], [place] [address] Phone # [phone] Fax [phone]

## 2015-09-27 ENCOUNTER — Ambulatory Visit (HOSPITAL_COMMUNITY): Payer: BLUE CROSS/BLUE SHIELD | Attending: Cardiology

## 2015-09-27 ENCOUNTER — Other Ambulatory Visit: Payer: Self-pay

## 2015-09-27 DIAGNOSIS — I071 Rheumatic tricuspid insufficiency: Secondary | ICD-10-CM | POA: Diagnosis not present

## 2015-09-27 DIAGNOSIS — I340 Nonrheumatic mitral (valve) insufficiency: Secondary | ICD-10-CM | POA: Insufficient documentation

## 2015-09-27 DIAGNOSIS — R0600 Dyspnea, unspecified: Secondary | ICD-10-CM | POA: Diagnosis not present

## 2015-09-27 DIAGNOSIS — R079 Chest pain, unspecified: Secondary | ICD-10-CM | POA: Diagnosis present

## 2015-09-27 DIAGNOSIS — R0789 Other chest pain: Secondary | ICD-10-CM | POA: Insufficient documentation

## 2015-09-27 DIAGNOSIS — I5189 Other ill-defined heart diseases: Secondary | ICD-10-CM | POA: Insufficient documentation

## 2015-09-28 ENCOUNTER — Encounter: Payer: Self-pay | Admitting: Physical Therapy

## 2015-09-28 ENCOUNTER — Ambulatory Visit: Payer: BLUE CROSS/BLUE SHIELD | Admitting: Physical Therapy

## 2015-09-28 DIAGNOSIS — M25572 Pain in left ankle and joints of left foot: Secondary | ICD-10-CM | POA: Diagnosis not present

## 2015-09-28 DIAGNOSIS — M25552 Pain in left hip: Secondary | ICD-10-CM

## 2015-09-28 DIAGNOSIS — M25562 Pain in left knee: Secondary | ICD-10-CM | POA: Diagnosis not present

## 2015-09-28 DIAGNOSIS — M6281 Muscle weakness (generalized): Secondary | ICD-10-CM

## 2015-09-28 NOTE — Therapy (Signed)
[facility] [address], [place], [place], [address] Phone: [phone]   Fax:  [phone]  Physical Therapy Treatment  Patient Details  Name: Lorraine Conrad MRN: [id number] Date of Birth: [birth year] Referring Provider: Karlton Lemon, MD  Encounter Date: 09/28/2015      PT End of Session - 09/28/15 0804    Visit Number 9   Date for PT Re-Evaluation 10/26/15   PT Start Time 0801   PT Stop Time 0843   PT Time Calculation (min) 42 min   Activity Tolerance Patient tolerated treatment well   Behavior During Therapy [facility] for tasks assessed/performed      Past Medical History  Diagnosis Date  . GERD (gastroesophageal reflux disease)   . Endometriosis   . Ovarian cyst, left   . External hemorrhoids   . Erosive gastritis   . Proctalgia fugax   . Allergy     SEASONAL  . Anemia   . OSA on CPAP   . Migraine 1998; 02/2014    "this one's lasted 10 days straight" (03/01/2014)  . Headache     "probably monthly" (03/01/2014)  . Arthritis     "spine" (03/01/2014)  . Chronic lower back pain   . H/O shortness of breath 2014    Cardiopulmonary exercise test results    Past Surgical History  Procedure Laterality Date  . Cesarean section  2002; 2006  . Temporomandibular joint surgery  ~ 1992  . Nasal septum surgery  ~ 1991  . Laparoscopic ovarian cystectomy  ~ 1999  . Upper gastrointestinal endoscopy    . Colonoscopy    . Abdominal hysterectomy  04/2013  . Breast biopsy Bilateral   . Breast lumpectomy Left ~ 1993    There were no vitals filed for this visit.      Subjective Assessment - 09/28/15 0802    Subjective Foot and knee giving her troubles this AM.    Currently in Pain? Yes   Pain Score 4    Pain Location Foot   Pain Orientation Left   Pain Descriptors / Indicators Dull   Aggravating Factors  Knee hurts going up steps   Pain Relieving Factors rest, not sure   Multiple Pain Sites No                          [facility] Adult PT Treatment/Exercise - 09/28/15 0001    Knee/Hip Exercises: Aerobic   Nustep L3 x 10 min   Knee/Hip Exercises: Standing   Hip Abduction Stengthening;Left;2 sets;15 reps   Forward Step Up Left;2 sets;15 reps;Hand Hold: 1;Step Height: 6"   Ankle Exercises: Standing   BAPS Sitting;Level 3  2 min each direction, 10 circles each direction   SLS 5x10 seconds   Rebounder Weight shifting 1 min each way  VC for correct LE position in standing   Heel Raises 20 reps;2 seconds   Other Standing Ankle Exercises Resisted walking 25# 10 x each direction  VC to DF ankle                  PT Short Term Goals - 08/08/15 1410    PT SHORT TERM GOAL #4   Title ascend steps with step over step gait with use of 1 rail   Time 4   Period Weeks   Status Achieved           PT Long Term Goals - 09/26/15 1256    PT  LONG TERM GOAL #1   Title be independent in advanced HEP   Time 4   Period Weeks   Status On-going   PT LONG TERM GOAL #2   Title reduce FOTO to < or = to 39% limitation   Time 4   Period Weeks   Status On-going  43% limitation   PT LONG TERM GOAL #3   Title wean from boot for all distances and demonstrate symmetry with ambulation   Status Achieved   PT LONG TERM GOAL #4   Title demonstrate 4+/5 to 5/5 Lt LE strength to improve endurance for independent grocery shopping   Time 4   Period Weeks   Status On-going   PT LONG TERM GOAL #5   Title report a 70% reduction in Lt LE pain with standing and walking   Time 4   Period Weeks   Status On-going  45% reduction   Additional Long Term Goals   Additional Long Term Goals Yes   PT LONG TERM GOAL #6   Title imrpove Lt ankle strength to report no episodes of Lt ankle instability with gait on unlevel surfaces   Time 4   Period Weeks   Status New   PT LONG TERM GOAL #7   Title improve LE endurance to allow for walking > or = to 45 minutes in the community without  limitation   Time 4   Period Weeks   Status New               Plan - 09/28/15 0805    Clinical Impression Statement Pt tolerated standing and sitting exercises for ankle, knee & hip strengthening without any complaints of pain today.    Rehab Potential Good   PT Frequency 2x / week   PT Duration 4 weeks   PT Treatment/Interventions ADLs/Self Care Home Management;Cryotherapy;Electrical Stimulation;Moist Heat;Therapeutic exercise;Therapeutic activities;Functional mobility training;Stair training;Gait training;Ultrasound;Neuromuscular re-education;Balance training;Patient/family education;Manual techniques;Taping;Dry needling;Passive range of motion   Consulted and Agree with Plan of Care Patient      Patient will benefit from skilled therapeutic intervention in order to improve the following deficits and impairments:  Decreased strength, Decreased mobility, Pain, Decreased activity tolerance, Decreased endurance, Difficulty walking, Abnormal gait  Visit Diagnosis: Pain in left ankle and joints of left foot  Pain in left hip  Muscle weakness (generalized)  Knee pain, acute, left     Problem List Patient Active Problem List   Diagnosis Date Noted  . Mass of ovary 07/12/2015  . History of laparoscopy 06/19/2015  . Left hip pain 05/24/2015  . Left knee pain 05/24/2015  . Left ankle pain 05/24/2015  . Chest wall pain 05/16/2015  . OSA (obstructive sleep apnea) 03/01/2015  . Leukocytosis 03/01/2015  . Leg weakness, bilateral 03/01/2015  . B12 deficiency 03/01/2015  . Allergic rhinitis 12/12/2014  . Dyspnea 12/12/2014  . SOB (shortness of breath) 10/30/2014  . Migraine 07/10/2014  . Hemorrhoid 07/10/2014  . H/O: hysterectomy 07/05/2014  . Arthralgia 05/08/2014  . Tachycardia 03/29/2014  . Tinnitus 03/27/2014  . Fatigue 03/27/2014  . Back pain 03/27/2014  . Shingles 03/09/2014  . Fever 03/09/2014  . Hot flashes 03/09/2014  . Meningitis, viral 03/02/2014  .  Headache 03/01/2014  . Myalgia and myositis 02/23/2014  . Headache(784.0) 02/23/2014  . Endometriosis 02/04/2014  . Routine general medical examination at a health care facility 01/16/2014  . Psoriasis 01/16/2014  . Obesity (BMI 30-39.9) 01/16/2014  . Neck pain 01/01/2014  . Dysuria 10/11/2013  . Abnormal  CBC 10/11/2013  . Watery eyes 10/11/2013  . Plantar fasciitis of left foot 03/18/2013  . IBS (irritable bowel syndrome) 03/18/2013  . Nausea alone 02/17/2013  . PLEURISY 11/10/2008  . GASTRITIS 08/10/2008  . ANXIETY 01/06/2008  . PROCTALGIA FUGAX 01/06/2008  . ABDOMINAL PAIN-MULTIPLE SITES 01/06/2008  . GERD 11/15/2007  . CONSTIPATION 11/15/2007  . Chest pain 11/15/2007  . OVARIAN CYST, LEFT 11/11/2007    Jatniel Verastegui, PTA 09/28/2015, 8:41 AM  [facility] [address] [place], [place], [address] Phone: [phone]   Fax:  [phone]  Name: Linsay Arno MRN: [id number] Date of Birth: 1971-01-30

## 2015-10-03 ENCOUNTER — Encounter: Payer: Self-pay | Admitting: Physical Therapy

## 2015-10-03 ENCOUNTER — Ambulatory Visit: Payer: BLUE CROSS/BLUE SHIELD | Admitting: Physical Therapy

## 2015-10-03 DIAGNOSIS — M25562 Pain in left knee: Secondary | ICD-10-CM | POA: Diagnosis not present

## 2015-10-03 DIAGNOSIS — M25572 Pain in left ankle and joints of left foot: Secondary | ICD-10-CM | POA: Diagnosis not present

## 2015-10-03 DIAGNOSIS — M25552 Pain in left hip: Secondary | ICD-10-CM | POA: Diagnosis not present

## 2015-10-03 DIAGNOSIS — M6281 Muscle weakness (generalized): Secondary | ICD-10-CM | POA: Diagnosis not present

## 2015-10-03 NOTE — Therapy (Signed)
[facility] [address], [place], [place], [address] Phone: [phone]   Fax:  [phone]  Physical Therapy Treatment  Patient Details  Name: Lorraine Conrad MRN: [id number] Date of Birth: 12-26-1970 Referring Provider: Karlton Lemon, MD  Encounter Date: 10/03/2015      PT End of Session - 10/03/15 0807    Visit Number 10   Date for PT Re-Evaluation 10/26/15   PT Start Time 0804   PT Stop Time 0855   PT Time Calculation (min) 51 min   Activity Tolerance Patient tolerated treatment well   Behavior During Therapy [facility] for tasks assessed/performed      Past Medical History  Diagnosis Date  . GERD (gastroesophageal reflux disease)   . Endometriosis   . Ovarian cyst, left   . External hemorrhoids   . Erosive gastritis   . Proctalgia fugax   . Allergy     SEASONAL  . Anemia   . OSA on CPAP   . Migraine 1998; 02/2014    "this one's lasted 10 days straight" (03/01/2014)  . Headache     "probably monthly" (03/01/2014)  . Arthritis     "spine" (03/01/2014)  . Chronic lower back pain   . H/O shortness of breath 2014    Cardiopulmonary exercise test results    Past Surgical History  Procedure Laterality Date  . Cesarean section  2002; 2006  . Temporomandibular joint surgery  ~ 1992  . Nasal septum surgery  ~ 1991  . Laparoscopic ovarian cystectomy  ~ 1999  . Upper gastrointestinal endoscopy    . Colonoscopy    . Abdominal hysterectomy  04/2013  . Breast biopsy Bilateral   . Breast lumpectomy Left ~ 1993    There were no vitals filed for this visit.      Subjective Assessment - 10/03/15 0808    Subjective I can tell the next day I had a workout. Next day pt reports her foot went out.    Currently in Pain? Yes   Pain Score 3    Pain Location Knee   Pain Orientation Left   Pain Descriptors / Indicators Dull   Aggravating Factors  Something always hurts   Pain Relieving Factors Rest   Multiple Pain  Sites No                         [facility] Adult PT Treatment/Exercise - 10/03/15 0001    Knee/Hip Exercises: Aerobic   Recumbent Bike L2 x 10 min   Knee/Hip Exercises: Standing   Hip Abduction Stengthening;Left;2 sets;10 reps;Knee straight   Abduction Limitations added 1#    Hip Extension Stengthening;Left;2 sets;10 reps;Knee straight   Extension Limitations added 1#   Forward Step Up Left;2 sets;15 reps;Hand Hold: 1;Step Height: 6"   Ankle Exercises: Standing   BAPS Sitting;Level 3  2 min each direction, 10 circles each direction   SLS 5x10 seconds   Rebounder Weight shifting 1 min each way  VC for correct LE position in standing   Heel Raises 20 reps;2 seconds   Other Standing Ankle Exercises Resisted walking 25# 10 x each direction  VC to DF ankle                  PT Short Term Goals - 08/08/15 1410    PT SHORT TERM GOAL #4   Title ascend steps with step over step gait with use of 1 rail   Time 4  Period Weeks   Status Achieved           PT Long Term Goals - 10/03/15 0810    PT LONG TERM GOAL #5   Title report a 70% reduction in Lt LE pain with standing and walking   Time 4   Period Weeks   Status On-going   PT LONG TERM GOAL #6   Title imrpove Lt ankle strength to report no episodes of Lt ankle instability with gait on unlevel surfaces   Time 4   Period Weeks   Status On-going  Has happened 1x since last treatment   PT LONG TERM GOAL #7   Title improve LE endurance to allow for walking > or = to 45 minutes in the community without limitation   Time 4   Period Weeks   Status On-going  Unchanged since last goal assessment.               Plan - 10/03/15 0809    Clinical Impression Statement Pt has had one episode of her foot 'giving out." Pain still limits her time standing and walking. This is fairly unchanged at this time. Pt is tolerating more strengthening exercises at this time which she reports she can tell in how it  feels.    Rehab Potential Good   PT Frequency 2x / week   PT Duration 4 weeks   PT Treatment/Interventions ADLs/Self Care Home Management;Cryotherapy;Electrical Stimulation;Moist Heat;Therapeutic exercise;Therapeutic activities;Functional mobility training;Stair training;Gait training;Ultrasound;Neuromuscular re-education;Balance training;Patient/family education;Manual techniques;Taping;Dry needling;Passive range of motion   PT Next Visit Plan LTLE strength   Consulted and Agree with Plan of Care Patient      Patient will benefit from skilled therapeutic intervention in order to improve the following deficits and impairments:  Decreased strength, Decreased mobility, Pain, Decreased activity tolerance, Decreased endurance, Difficulty walking, Abnormal gait  Visit Diagnosis: Pain in left ankle and joints of left foot  Pain in left hip  Knee pain, acute, left  Hip pain, acute, left  Muscle weakness (generalized)     Problem List Patient Active Problem List   Diagnosis Date Noted  . Mass of ovary 07/12/2015  . History of laparoscopy 06/19/2015  . Left hip pain 05/24/2015  . Left knee pain 05/24/2015  . Left ankle pain 05/24/2015  . Chest wall pain 05/16/2015  . OSA (obstructive sleep apnea) 03/01/2015  . Leukocytosis 03/01/2015  . Leg weakness, bilateral 03/01/2015  . B12 deficiency 03/01/2015  . Allergic rhinitis 12/12/2014  . Dyspnea 12/12/2014  . SOB (shortness of breath) 10/30/2014  . Migraine 07/10/2014  . Hemorrhoid 07/10/2014  . H/O: hysterectomy 07/05/2014  . Arthralgia 05/08/2014  . Tachycardia 03/29/2014  . Tinnitus 03/27/2014  . Fatigue 03/27/2014  . Back pain 03/27/2014  . Shingles 03/09/2014  . Fever 03/09/2014  . Hot flashes 03/09/2014  . Meningitis, viral 03/02/2014  . Headache 03/01/2014  . Myalgia and myositis 02/23/2014  . Headache(784.0) 02/23/2014  . Endometriosis 02/04/2014  . Routine general medical examination at a health care facility  01/16/2014  . Psoriasis 01/16/2014  . Obesity (BMI 30-39.9) 01/16/2014  . Neck pain 01/01/2014  . Dysuria 10/11/2013  . Abnormal CBC 10/11/2013  . Watery eyes 10/11/2013  . Plantar fasciitis of left foot 03/18/2013  . IBS (irritable bowel syndrome) 03/18/2013  . Nausea alone 02/17/2013  . PLEURISY 11/10/2008  . GASTRITIS 08/10/2008  . ANXIETY 01/06/2008  . PROCTALGIA FUGAX 01/06/2008  . ABDOMINAL PAIN-MULTIPLE SITES 01/06/2008  . GERD 11/15/2007  . CONSTIPATION 11/15/2007  .  Chest pain 11/15/2007  . OVARIAN CYST, LEFT 11/11/2007    Lorraine Conrad, PTA 10/03/2015, 8:48 AM  [facility] [address], [place], [place], [address] Phone: [phone]   Fax:  [phone]  Name: Lorraine Conrad MRN: [id number] Date of Birth: March 05, 1971

## 2015-10-04 ENCOUNTER — Telehealth: Payer: Self-pay | Admitting: Cardiology

## 2015-10-04 NOTE — Telephone Encounter (Signed)
New Message:  Pt called in stating that since she received that results from her Stress test could she go back to working out and is there any precautions she needs to take. Please f/u with her  Thanks

## 2015-10-05 ENCOUNTER — Encounter: Payer: Self-pay | Admitting: Medical

## 2015-10-05 ENCOUNTER — Ambulatory Visit: Payer: BLUE CROSS/BLUE SHIELD | Admitting: Physical Therapy

## 2015-10-05 ENCOUNTER — Ambulatory Visit (INDEPENDENT_AMBULATORY_CARE_PROVIDER_SITE_OTHER): Payer: BLUE CROSS/BLUE SHIELD | Admitting: Medical

## 2015-10-05 ENCOUNTER — Ambulatory Visit (INDEPENDENT_AMBULATORY_CARE_PROVIDER_SITE_OTHER): Payer: BLUE CROSS/BLUE SHIELD

## 2015-10-05 ENCOUNTER — Other Ambulatory Visit: Payer: Self-pay | Admitting: Medical

## 2015-10-05 ENCOUNTER — Ambulatory Visit (HOSPITAL_BASED_OUTPATIENT_CLINIC_OR_DEPARTMENT_OTHER)
Admission: RE | Admit: 2015-10-05 | Discharge: 2015-10-05 | Disposition: A | Discharge status: Home / Self Care | Payer: BLUE CROSS/BLUE SHIELD | Source: Ambulatory Visit | Attending: Medical | Admitting: Medical

## 2015-10-05 ENCOUNTER — Telehealth: Payer: Self-pay | Admitting: *Deleted

## 2015-10-05 VITALS — BP 104/70 | HR 99 | Temp 98.7°F | Ht 64.0 in | Wt 205.0 lb

## 2015-10-05 DIAGNOSIS — R0602 Shortness of breath: Secondary | ICD-10-CM

## 2015-10-05 DIAGNOSIS — R200 Anesthesia of skin: Secondary | ICD-10-CM

## 2015-10-05 DIAGNOSIS — R51 Headache: Principal | ICD-10-CM

## 2015-10-05 DIAGNOSIS — R519 Headache, unspecified: Secondary | ICD-10-CM

## 2015-10-05 DIAGNOSIS — R0789 Other chest pain: Secondary | ICD-10-CM

## 2015-10-05 DIAGNOSIS — R202 Paresthesia of skin: Secondary | ICD-10-CM

## 2015-10-05 MED ORDER — KETOROLAC TROMETHAMINE 60 MG/2ML IM SOLN
60.0000 mg | Freq: Once | INTRAMUSCULAR | Status: AC
  Administered 2015-10-05: 60 mg via INTRAMUSCULAR

## 2015-10-05 NOTE — Telephone Encounter (Signed)
Spoke with pt regarding being able to work out again since she has had her ECHO and Stress test.  Pt has a couple of questions re: Stress test and Nell Range, PA-C did take a look at it and try to answer them, but advised to send to Dr. Radford Pax for advice!  Pt has questions regarding the 2 statements from the stress test:  Please advise and I will be glad to contact the pt back.  Thanks! 1.  There is a defect present in the apex location. This is non-reversible.     2.  Myocardial perfusion is normal. The study is normal. Probable normal perfusion an mild apical thinning.

## 2015-10-05 NOTE — Telephone Encounter (Addendum)
Follow up      Pt states she has been calling for several days to find out if she can return to working out.  Also, she want to talk to [place] the PA.  She has questions about her most recent test.  I told her Celedonio Miyamoto is out of the office today.  She said ok to wait until she returns on Monday.

## 2015-10-05 NOTE — Progress Notes (Signed)
Pre visit review using our clinic review tool, if applicable. No additional management support is needed unless otherwise documented below in the visit note. 

## 2015-10-05 NOTE — Patient Instructions (Addendum)
For your history of chest pain,  I want you to get cardiologist opinion on exercise clearance.  For headache we will get ct of the head stat today. Toradol 60 mg im today.  I would also ask you reconsider getting temporal artery biopsy since you still have pain in this area.  If you get recurrent severe pain or signs/symptoms as discussed over weekend then ED evaluation as sometimes mri are needed even if CT tests are negative.  Follow up 10-14 days or as needed

## 2015-10-05 NOTE — Telephone Encounter (Signed)
Jeanann Lewandowsky, RMA   Sent: Fri October 05, 2015 3:54 PM    To: Brittainy Erie Noe, PA-C; Theodoro Parma, RN        Message     Spoke with pt and advised her that [place] was out of the office today, but however, I did speak with another PA, Nell Range, and she has advised that she may go back to working out.    Pt verbalized understanding and appreciation for the call.

## 2015-10-05 NOTE — Progress Notes (Signed)
Subjective:    Patient ID: Lorraine Conrad, female    DOB: 1971/04/21, 45 y.o.   MRN: [id number]  HPI  Pt is feeling better today.   Pt has seen her gyn. He took her off progesterone. He thought best to minimize risk of possible complications from.  Pt has seen cardiologist for stress test. Also had an echocardiogram. Pt has called about opinion on exercise. She has not got a call back. Pt has some mitral valve mild regurgitation.Pt nuclear stress test reviewed today.   Pt also mentioned today states sleeping and when she woke had some sharp transient pain in left side scalp parietal area. Last for about a minute. Pt has no scalp senstivitiy on exam. No rash on exam. Pt states occasional pain behind her left ear.  Pt states she has mild ha since this am when she woke up with pain in parietal area left side. She felt some transient numb sensation to left side of her face. Then later that night back of her head hurt.   Head ache level now level 3. At onset when felt pain severe. Brought tears to her eyes.  Pt has not had temporal artery biospy. Although, ent she has seen in the past did recommend.     Review of Systems  Constitutional: Negative for chills and fatigue.  Respiratory: Negative for cough, chest tightness, shortness of breath and wheezing.   Cardiovascular: Negative for chest pain and palpitations.  Gastrointestinal: Negative for abdominal pain.  Musculoskeletal: Negative for back pain.  Skin: Negative for rash.  Neurological: Positive for headaches. Negative for dizziness, tremors, seizures, syncope, facial asymmetry, speech difficulty, weakness and numbness.       Severe at onset of ha with sensation of numbness.  Hematological: Negative for adenopathy. Does not bruise/bleed easily.  Psychiatric/Behavioral: Negative for behavioral problems and confusion. The patient is not nervous/anxious.     Past Medical History  Diagnosis Date  . GERD (gastroesophageal reflux disease)    . Endometriosis   . Ovarian cyst, left   . External hemorrhoids   . Erosive gastritis   . Proctalgia fugax   . Allergy     SEASONAL  . Anemia   . OSA on CPAP   . Migraine 1998; 02/2014    "this one's lasted 10 days straight" (03/01/2014)  . Headache     "probably monthly" (03/01/2014)  . Arthritis     "spine" (03/01/2014)  . Chronic lower back pain   . H/O shortness of breath 2014    Cardiopulmonary exercise test results     Social History   Social History  . Marital Status: Married    Spouse Name: N/A  . Number of Children: N/A  . Years of Education: N/A   Occupational History  . Not on file.   Social History Main Topics  . Smoking status: Never Smoker   . Smokeless tobacco: Never Used  . Alcohol Use: 0.0 oz/week    0 Standard drinks or equivalent per week     Comment: 03/01/2014 "I'll have 1-2 drinks maybe 3-4 times/yr"  . Drug Use: No  . Sexual Activity: Yes    Birth Control/ Protection: Surgical   Other Topics Concern  . Not on file   Social History Narrative   Daily caffiene use: 6 cups per day   Patient does not get regular excercise    Past Surgical History  Procedure Laterality Date  . Cesarean section  2002; 2006  . Temporomandibular joint surgery  ~  1992  . Nasal septum surgery  ~ 1991  . Laparoscopic ovarian cystectomy  ~ 1999  . Upper gastrointestinal endoscopy    . Colonoscopy    . Abdominal hysterectomy  04/2013  . Breast biopsy Bilateral   . Breast lumpectomy Left ~ 1993    Family History  Problem Relation Age of Onset  . Breast cancer Maternal Grandmother   . Colon cancer Neg Hx   . Prostate cancer Paternal Grandfather   . Colon polyps Father   . Heart disease Father     Allergies  Allergen Reactions  . Promethazine Hcl Other (See Comments)    Violent tremors   . Cephalexin Diarrhea and Itching  . Codeine Other (See Comments)    Was a child when she took this.  . Dilaudid [Hydromorphone] Nausea And Vomiting  . Azithromycin  Rash    Head to toe rash    Current Outpatient Prescriptions on File Prior to Visit  Medication Sig Dispense Refill  . albuterol (PROVENTIL HFA;VENTOLIN HFA) 108 (90 BASE) MCG/ACT inhaler Inhale 2 puffs into the lungs every 6 (six) hours as needed for wheezing or shortness of breath. 1 Inhaler 3  . azelastine (ASTELIN) 0.1 % nasal spray Place 2 sprays into both nostrils 2 (two) times daily. Use in each nostril as directed 30 mL 3  . beclomethasone (QVAR) 40 MCG/ACT inhaler Inhale 2 puffs into the lungs 2 (two) times daily at 10 AM and 5 PM. 1 Inhaler 3  . diclofenac (VOLTAREN) 75 MG EC tablet Take 1 tablet (75 mg total) by mouth 2 (two) times daily. 30 tablet 1  . lansoprazole (PREVACID) 30 MG capsule Take 30 mg by mouth daily at 12 noon.    . methocarbamol (ROBAXIN) 500 MG tablet Take 1 tablet (500 mg total) by mouth every 6 (six) hours as needed for muscle spasms. 20 tablet 0  . mometasone (NASONEX) 50 MCG/ACT nasal spray USE 2 SPRAYS IN EACH NOSTRIL EVERY DAY 17 g 1  . NEOMYCIN-POLYMYXIN-HYDROCORTISONE (CORTISPORIN) 1 % SOLN otic solution Place 3 drops into the left ear every 8 (eight) hours. 10 mL 0  . sulfamethoxazole-trimethoprim (BACTRIM DS) 800-160 MG tablet Take 1 tablet by mouth 2 (two) times daily. 14 tablet 0  . terbinafine (LAMISIL) 250 MG tablet Take 1 tablet (250 mg total) by mouth daily. 30 tablet 0   No current facility-administered medications on file prior to visit.    BP 104/70 mmHg  Pulse 99  Temp(Src) 98.7 F (37.1 C) (Oral)  Ht 5\' 4"  (1.626 m)  Wt 205 lb (92.987 kg)  BMI 35.17 kg/m2  SpO2 98%  LMP 02/08/2013       Objective:   Physical Exam  General Mental Status- Alert. General Appearance- Not in acute distress.   Skin General: Color- Normal Color. Moisture- Normal Moisture.  Neck Carotid Arteries- Normal color. Moisture- Normal Moisture. No carotid bruits. No JVD.  Chest and Lung Exam Auscultation: Breath Sounds:-Normal.  CTA.  Cardiovascular Auscultation:Rythm- Regular,Rate and rhythm. Murmurs & Other Heart Sounds:Auscultation of the heart reveals- No Murmurs.  Abdomen Inspection:-Inspeection Normal. Palpation/Percussion:Note:No mass. Palpation and Percussion of the abdomen reveal- Non Tender, Non Distended + BS, no rebound or guarding.    Neurologic Cranial Nerve exam:- CN III-XII intact(No nystagmus), symmetric smile. Drift Test:- No drift. Romberg Exam:- Negative.  Heal to Toe Gait exam:-Normal. Finger to Nose:- Normal/Intact Strength:- 5/5 equal and symmetric strength both upper and lower extremities.      Assessment & Plan:  For your  history of chest pain(none present today) I want you to get cardiologist opinion on exercise clearance.  For headache we will get ct of the head stat today. Toradol 60 mg im..  I would also ask you reconsider getting temporal artery biopsy since you still have pain in this area.  If you get recurrent severe pain or signs/symptoms as discussed over weekend then ED evaluation as sometimes mri are needed even if CT tests are negative.  Follow up 10-14 days or as needed  Aviona Martenson, Percell Miller, [employer]

## 2015-10-06 NOTE — Telephone Encounter (Signed)
Moderate defect corresponds to apical thinning which is an artifact due to acquisition of imaging and is not related to ischemia.  Her study shows no ischemia

## 2015-10-08 ENCOUNTER — Ambulatory Visit (INDEPENDENT_AMBULATORY_CARE_PROVIDER_SITE_OTHER): Payer: BLUE CROSS/BLUE SHIELD | Admitting: Family Medicine

## 2015-10-08 ENCOUNTER — Encounter: Payer: Self-pay | Admitting: Family Medicine

## 2015-10-08 VITALS — BP 133/87 | HR 95 | Ht 64.0 in | Wt 205.0 lb

## 2015-10-08 DIAGNOSIS — M25562 Pain in left knee: Secondary | ICD-10-CM | POA: Diagnosis not present

## 2015-10-08 DIAGNOSIS — M25572 Pain in left ankle and joints of left foot: Secondary | ICD-10-CM | POA: Diagnosis not present

## 2015-10-08 DIAGNOSIS — M25552 Pain in left hip: Secondary | ICD-10-CM
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
## 2015-10-08 NOTE — Patient Instructions (Signed)
I would go ahead with an MRI of your knee now to assess for a meniscus tear. Consider one of your foot/ankle as well to assess the navicular and posterior tibialis tendon tear given how long it has been and you're not improving as expected. Continue physical therapy in meantime.

## 2015-10-08 NOTE — Assessment & Plan Note (Signed)
2/2 IT band strain.  Not reexamined today - not as bad as other issues.  Continue PT, HEP, icing, nsaids if needed.

## 2015-10-08 NOTE — Assessment & Plan Note (Signed)
2/2 posterior tibialis and extensor tendon strains.  Brief MSK u/s was reassuring.  Advised I doubt anything concerning is going on here based on exam, ultrasound.  She would like to move forward with MRI to further assess for possible occult fracture, partial post tib tendon tear.

## 2015-10-08 NOTE — Telephone Encounter (Signed)
Please have her come in for a BNP and set up coronary calcium score - if normal will refer to pulmonary

## 2015-10-08 NOTE — Telephone Encounter (Signed)
Informed patient of Dr. Theodosia Blender notes and verbal understanding was expressed. Per patient request, reviewed results of echo and stress test in great detail. Patient is concerned because of her mild MR since she is still having SOB. Informed the patient that mild MR does not usually cause her symptoms, but Dr. Radford Pax will be asked for further recommendations. Patient st her SOB comes and goes, but "when it's bad, it's bad." She reports sometimes she cannot walk to the kitchen. She reports no other symptoms.  To Dr. Radford Pax.

## 2015-10-09 NOTE — Telephone Encounter (Signed)
Mailbox is full and not accepting messages at this time.  Will try again later.

## 2015-10-10 ENCOUNTER — Ambulatory Visit: Payer: BLUE CROSS/BLUE SHIELD | Attending: Family Medicine | Admitting: Physical Therapy

## 2015-10-10 ENCOUNTER — Telehealth: Payer: Self-pay | Admitting: *Deleted

## 2015-10-10 ENCOUNTER — Encounter: Payer: Self-pay | Admitting: Physical Therapy

## 2015-10-10 DIAGNOSIS — M25572 Pain in left ankle and joints of left foot: Secondary | ICD-10-CM

## 2015-10-10 DIAGNOSIS — M25552 Pain in left hip: Secondary | ICD-10-CM

## 2015-10-10 DIAGNOSIS — M6281 Muscle weakness (generalized): Secondary | ICD-10-CM

## 2015-10-10 DIAGNOSIS — M25562 Pain in left knee: Secondary | ICD-10-CM | POA: Diagnosis not present

## 2015-10-10 NOTE — Telephone Encounter (Signed)
Spoke to patient about the foot/ankle MRI. She would like to have a MRI of the foot since she stated she is having pain at the left great toe. Told her that it would be 2 MRIs if she wants one of the foot as well as one of the ankle. Told her that the ankle MRI would consist of 1/2 the metatarsals back.

## 2015-10-10 NOTE — Telephone Encounter (Signed)
There is no indication on her exam to do an MRI of anything other than an ankle MRI which will cover the entire area of her pain.  This new great toe pain does not need to be evaluated with an MRI and there is nothing in this area that an MRI would identify that would change our management.

## 2015-10-10 NOTE — Therapy (Signed)
[facility] [address] [place], [place], [address] Phone: [phone]   Fax:  [phone]  Physical Therapy Treatment  Patient Details  Name: Lorraine Conrad MRN: [id number] Date of Birth: February 08, 1971 Referring Provider: Karlton Lemon, MD  Encounter Date: 10/10/2015      PT End of Session - 10/10/15 0814    Visit Number 11   Date for PT Re-Evaluation 10/26/15   PT Start Time 0811  11 min late   PT Stop Time 0852   PT Time Calculation (min) 41 min   Activity Tolerance Patient tolerated treatment well   Behavior During Therapy [facility] for tasks assessed/performed      Past Medical History  Diagnosis Date  . GERD (gastroesophageal reflux disease)   . Endometriosis   . Ovarian cyst, left   . External hemorrhoids   . Erosive gastritis   . Proctalgia fugax   . Allergy     SEASONAL  . Anemia   . OSA on CPAP   . Migraine 1998; 02/2014    "this one's lasted 10 days straight" (03/01/2014)  . Headache     "probably monthly" (03/01/2014)  . Arthritis     "spine" (03/01/2014)  . Chronic lower back pain   . H/O shortness of breath 2014    Cardiopulmonary exercise test results    Past Surgical History  Procedure Laterality Date  . Cesarean section  2002; 2006  . Temporomandibular joint surgery  ~ 1992  . Nasal septum surgery  ~ 1991  . Laparoscopic ovarian cystectomy  ~ 1999  . Upper gastrointestinal endoscopy    . Colonoscopy    . Abdominal hysterectomy  04/2013  . Breast biopsy Bilateral   . Breast lumpectomy Left ~ 1993    There were no vitals filed for this visit.      Subjective Assessment - 10/10/15 0815    Subjective Pt reports she is going to have MRI of Lt knee and foot secondary to it not improving. She was in pain after last visit.    Currently in Pain? Yes   Pain Score 4    Pain Location Knee   Pain Orientation Left   Pain Descriptors / Indicators Dull;Sore   Aggravating Factors  Fairly  constant   Pain Relieving Factors Rest   Multiple Pain Sites No                         [facility] Adult PT Treatment/Exercise - 10/10/15 0001    Knee/Hip Exercises: Aerobic   Nustep L3 x 10 min   Knee/Hip Exercises: Standing   Hip Abduction Stengthening;Left;2 sets;10 reps;Knee straight   Abduction Limitations 1#   Hip Extension Stengthening;Left;2 sets;10 reps;Knee straight   Extension Limitations 1#   Lateral Step Up Left;2 sets;10 reps;Hand Hold: 1;Step Height: 6"   Forward Step Up Left;2 sets;15 reps;Hand Hold: 1;Step Height: 6"   Ankle Exercises: Standing   BAPS Sitting;Level 4  2 min each direction, 10 circles each direction   SLS 5x10 seconds  Burning across top of the foot.    Rebounder Weight shifting 1 min each way  VC for correct LE position in standing   Heel Raises 20 reps;2 seconds   Other Standing Ankle Exercises Resisted walking 25# 10 x each direction  VC to DF ankle                  PT Short Term Goals - 08/08/15 1410  PT SHORT TERM GOAL #4   Title ascend steps with step over step gait with use of 1 rail   Time 4   Period Weeks   Status Achieved           PT Long Term Goals - 10/10/15 0818    PT LONG TERM GOAL #5   Title report a 70% reduction in Lt LE pain with standing and walking   Time 4   Period Weeks   Status On-going  Still intermittent sharp pains   PT LONG TERM GOAL #6   Title imrpove Lt ankle strength to report no episodes of Lt ankle instability with gait on unlevel surfaces   Time 4   Period Weeks   Status On-going   PT LONG TERM GOAL #7   Title improve LE endurance to allow for walking > or = to 45 minutes in the community without limitation   Time 4   Period Weeks   Status On-going               Plan - 10/10/15 8469    Clinical Impression Statement Pt reports some burning across the top of her foot intermittently during session. No episodes of instability although pt does move slowly and  cautiously. She is awaiting an MRI of her LT foot and knee. She would like to continue working her leg strength through her current plan of care . No new goals met this week. Will MMT the ankle next week.    Rehab Potential Good   PT Frequency 2x / week   PT Treatment/Interventions ADLs/Self Care Home Management;Cryotherapy;Electrical Stimulation;Moist Heat;Therapeutic exercise;Therapeutic activities;Functional mobility training;Stair training;Gait training;Ultrasound;Neuromuscular re-education;Balance training;Patient/family education;Manual techniques;Taping;Dry needling;Passive range of motion   PT Next Visit Plan LTLE strength      Patient will benefit from skilled therapeutic intervention in order to improve the following deficits and impairments:  Decreased strength, Decreased mobility, Pain, Decreased activity tolerance, Decreased endurance, Difficulty walking, Abnormal gait  Visit Diagnosis: Muscle weakness (generalized)  Hip pain, acute, left  Knee pain, acute, left  Pain in left hip  Pain in left ankle and joints of left foot     Problem List Patient Active Problem List   Diagnosis Date Noted  . Mass of ovary 07/12/2015  . History of laparoscopy 06/19/2015  . Left hip pain 05/24/2015  . Left knee pain 05/24/2015  . Left ankle pain 05/24/2015  . Chest wall pain 05/16/2015  . OSA (obstructive sleep apnea) 03/01/2015  . Leukocytosis 03/01/2015  . Leg weakness, bilateral 03/01/2015  . B12 deficiency 03/01/2015  . Allergic rhinitis 12/12/2014  . Dyspnea 12/12/2014  . SOB (shortness of breath) 10/30/2014  . Migraine 07/10/2014  . Hemorrhoid 07/10/2014  . H/O: hysterectomy 07/05/2014  . Arthralgia 05/08/2014  . Tachycardia 03/29/2014  . Tinnitus 03/27/2014  . Fatigue 03/27/2014  . Back pain 03/27/2014  . Shingles 03/09/2014  . Fever 03/09/2014  . Hot flashes 03/09/2014  . Meningitis, viral 03/02/2014  . Headache 03/01/2014  . Myalgia and myositis 02/23/2014  .  Headache(784.0) 02/23/2014  . Endometriosis 02/04/2014  . Routine general medical examination at a health care facility 01/16/2014  . Psoriasis 01/16/2014  . Obesity (BMI 30-39.9) 01/16/2014  . Neck pain 01/01/2014  . Dysuria 10/11/2013  . Abnormal CBC 10/11/2013  . Watery eyes 10/11/2013  . Plantar fasciitis of left foot 03/18/2013  . IBS (irritable bowel syndrome) 03/18/2013  . Nausea alone 02/17/2013  . PLEURISY 11/10/2008  . GASTRITIS 08/10/2008  . ANXIETY  01/06/2008  . PROCTALGIA FUGAX 01/06/2008  . ABDOMINAL PAIN-MULTIPLE SITES 01/06/2008  . GERD 11/15/2007  . CONSTIPATION 11/15/2007  . Chest pain 11/15/2007  . OVARIAN CYST, LEFT 11/11/2007    Britanie Harshman, PTA 10/10/2015, 8:51 AM  [facility] [address] [place], [place], [address] Phone: [phone]   Fax:  [phone]  Name: Lorraine Conrad MRN: [id number] Date of Birth: 09/30/1970

## 2015-10-11 NOTE — Telephone Encounter (Signed)
Orders are in and patient will be contacted for scheduling.

## 2015-10-11 NOTE — Telephone Encounter (Signed)
Calcium score and BNP ordered for scheduling. Patient understands if testing is normal, a referral to pulmonary will be placed. Patient agrees with treatment plan.

## 2015-10-11 NOTE — Addendum Note (Signed)
Addended by: Sherrie George F on: 10/11/2015 08:26 AM   Modules accepted: Orders

## 2015-10-13 ENCOUNTER — Ambulatory Visit (HOSPITAL_BASED_OUTPATIENT_CLINIC_OR_DEPARTMENT_OTHER)
Admission: RE | Admit: 2015-10-13 | Discharge: 2015-10-13 | Disposition: A | Discharge status: Home / Self Care | Payer: BLUE CROSS/BLUE SHIELD | Source: Ambulatory Visit | Attending: Family Medicine | Admitting: Family Medicine

## 2015-10-13 DIAGNOSIS — M19072 Primary osteoarthritis, left ankle and foot: Secondary | ICD-10-CM | POA: Diagnosis not present

## 2015-10-13 DIAGNOSIS — M25562 Pain in left knee: Secondary | ICD-10-CM | POA: Insufficient documentation

## 2015-10-13 DIAGNOSIS — M659 Synovitis and tenosynovitis, unspecified: Secondary | ICD-10-CM | POA: Diagnosis not present

## 2015-10-13 DIAGNOSIS — M25572 Pain in left ankle and joints of left foot: Secondary | ICD-10-CM | POA: Diagnosis not present

## 2015-10-13 DIAGNOSIS — M25472 Effusion, left ankle: Secondary | ICD-10-CM | POA: Diagnosis not present

## 2015-10-13 DIAGNOSIS — M7652 Patellar tendinitis, left knee: Secondary | ICD-10-CM | POA: Insufficient documentation

## 2015-10-17 ENCOUNTER — Ambulatory Visit: Payer: BLUE CROSS/BLUE SHIELD | Admitting: Physical Therapy

## 2015-10-18 ENCOUNTER — Telehealth: Payer: Self-pay | Admitting: Medical

## 2015-10-18 ENCOUNTER — Encounter: Payer: BLUE CROSS/BLUE SHIELD | Admitting: Medical

## 2015-10-18 NOTE — Progress Notes (Signed)
This encounter was created in error - please disregard.

## 2015-10-18 NOTE — Telephone Encounter (Signed)
Pt left VM 5/11 8:12am that something came up and she will not be in for 9:00am appt today, 2nd no show, charge or no charge?

## 2015-10-18 NOTE — Telephone Encounter (Signed)
No charge. But remind me on next no show that I intend to charge

## 2015-10-22 ENCOUNTER — Encounter: Payer: Self-pay | Admitting: Gastroenterology

## 2015-10-22 ENCOUNTER — Encounter: Payer: BLUE CROSS/BLUE SHIELD | Admitting: Physical Therapy

## 2015-10-23 ENCOUNTER — Encounter: Payer: Self-pay | Admitting: Podiatry

## 2015-10-23 ENCOUNTER — Ambulatory Visit (INDEPENDENT_AMBULATORY_CARE_PROVIDER_SITE_OTHER): Payer: BLUE CROSS/BLUE SHIELD | Admitting: Podiatry

## 2015-10-23 VITALS — BP 118/75 | HR 68 | Resp 16

## 2015-10-23 DIAGNOSIS — L603 Nail dystrophy: Secondary | ICD-10-CM | POA: Diagnosis not present

## 2015-10-23 NOTE — Progress Notes (Signed)
She presents today to discuss laser therapy for her onychomycosis. We have prescribed her oral therapy last visit however she states she does not want to take the oral therapy at this time she got to consider laser therapy.  Objective: Onychomycosis.  Assessment: Onychomycosis.  Plan: Discussed laser therapy signed her up for 6 sessions of laser and she will follow-up with [place] in the near future.

## 2015-10-23 NOTE — Telephone Encounter (Signed)
BNP and calcium score are scheduled 5/19.

## 2015-10-24 DIAGNOSIS — N941 Unspecified dyspareunia: Secondary | ICD-10-CM | POA: Diagnosis not present

## 2015-10-24 DIAGNOSIS — Z6836 Body mass index (BMI) 36.0-36.9, adult: Secondary | ICD-10-CM | POA: Diagnosis not present

## 2015-10-24 DIAGNOSIS — F419 Anxiety disorder, unspecified: Secondary | ICD-10-CM | POA: Diagnosis not present

## 2015-10-25 ENCOUNTER — Ambulatory Visit: Payer: BLUE CROSS/BLUE SHIELD

## 2015-10-25 DIAGNOSIS — M25562 Pain in left knee: Secondary | ICD-10-CM

## 2015-10-25 DIAGNOSIS — M6281 Muscle weakness (generalized): Secondary | ICD-10-CM | POA: Diagnosis not present

## 2015-10-25 DIAGNOSIS — M25552 Pain in left hip: Secondary | ICD-10-CM | POA: Diagnosis not present

## 2015-10-25 DIAGNOSIS — M25572 Pain in left ankle and joints of left foot: Secondary | ICD-10-CM | POA: Diagnosis not present

## 2015-10-25 NOTE — Patient Instructions (Signed)
Knee High   Holding stable object, raise knee to hip level, then lower knee. Repeat with other knee. Complete __10_ repetitions. Do __2__ sessions per day.  ABDUCTION: Standing (Active)   Stand, feet flat. Lift right leg out to side. Use _0__ lbs. Complete __10_ repetitions. Perform __2_ sessions per day.  EXTENSION: Standing (Active)  Stand, both feet flat. Draw right leg behind body as far as possible. Use 0___ lbs. Complete 10 repetitions. Perform __2_ sessions per day.  Copyright  VHI. All rights reserved.  HIP: Hamstrings - Short Sitting    Rest leg on raised surface. Keep knee straight. Lift chest. Hold _20__ seconds. _3__ reps per set, __3_ sets per day, ___ days per week  Copyright  VHI. All rights reserved.  Anterior Step-Up    Stand with ___ inch step placed in A direction. Step onto step with right foot without pushing off with the ground foot. Finish with ground foot in touch balance on step and return _10__ times. _2__ sets _2__ times per day.  http://gglj.exer.us/161   Copyright  VHI. All rights reserved.  KNEE: Extension, Long Arc Quads - Sitting    Raise leg until knee is straight.  Hold 5 seconds _10__ reps per set, _2__ sets per day, ___ days per week  Copyright  VHI. All rights reserved.   Heel Raises    Stand with support. Tighten pelvic floor and hold. With knees straight, raise heels off ground. Hold ___ seconds. Relax for ___ seconds. Repeat _10__ times. Do _2__ times a day.  Copyright  VHI. All rights reserved.  [place] [address] [place], [place] [address] Phone # [phone] Fax [phone]
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
## 2015-10-26 ENCOUNTER — Inpatient Hospital Stay: Admission: RE | Admit: 2015-10-26 | Payer: BLUE CROSS/BLUE SHIELD | Source: Ambulatory Visit

## 2015-10-26 ENCOUNTER — Other Ambulatory Visit (INDEPENDENT_AMBULATORY_CARE_PROVIDER_SITE_OTHER): Payer: BLUE CROSS/BLUE SHIELD | Admitting: *Deleted

## 2015-10-26 ENCOUNTER — Other Ambulatory Visit: Payer: Self-pay | Admitting: Obstetrics and Gynecology

## 2015-10-26 ENCOUNTER — Telehealth: Payer: Self-pay | Admitting: Family Medicine

## 2015-10-26 ENCOUNTER — Telehealth: Payer: Self-pay | Admitting: Medical

## 2015-10-26 ENCOUNTER — Ambulatory Visit (INDEPENDENT_AMBULATORY_CARE_PROVIDER_SITE_OTHER)
Admission: RE | Admit: 2015-10-26 | Discharge: 2015-10-26 | Disposition: A | Discharge status: Home / Self Care | Payer: Self-pay | Source: Ambulatory Visit | Attending: Cardiology | Admitting: Cardiology

## 2015-10-26 DIAGNOSIS — R0602 Shortness of breath: Secondary | ICD-10-CM

## 2015-10-26 DIAGNOSIS — Z1231 Encounter for screening mammogram for malignant neoplasm of breast: Secondary | ICD-10-CM

## 2015-10-26 DIAGNOSIS — Z136 Encounter for screening for cardiovascular disorders: Secondary | ICD-10-CM | POA: Diagnosis not present

## 2015-10-26 LAB — BRAIN NATRIURETIC PEPTIDE: Brain Natriuretic Peptide: 17.6 pg/mL (ref <100)

## 2015-10-26 NOTE — Telephone Encounter (Signed)
Please advise 

## 2015-10-26 NOTE — Telephone Encounter (Signed)
Can be reached: [phone]  Pharmacy: [id number] - [place], [place] AT [address]  Reason for call: Pt states she has a sinus infection and she is up here all the time. She is asking that Percell Miller send in abx for her. Advised her need for appt and she requested I send msg because she is here so often.

## 2015-10-26 NOTE — Telephone Encounter (Signed)
I finally got to her note at 10:30 at night. Yes she does come in fair number of times but does not warrant giving antibiotics without being seen. So have her schedule appointment. Schedule 30 minutes. Explain I rarely ever rx antibiotic wihtout seeing someone in office. Unless they saw me within past 5 days or so and had uri or allergy signs or symptoms. And were worsening.

## 2015-10-29 ENCOUNTER — Telehealth: Payer: Self-pay | Admitting: Family Medicine

## 2015-10-29 MED ORDER — NITROGLYCERIN 0.2 MG/HR TD PT24: MEDICATED_PATCH | TRANSDERMAL | Status: DC

## 2015-10-29 NOTE — Telephone Encounter (Signed)
Told her that we would send them in to her pharmacy.

## 2015-10-29 NOTE — Telephone Encounter (Signed)
Please have pt come in for an appointment.

## 2015-10-29 NOTE — Telephone Encounter (Signed)
See other tel note. Notified pt she needs appt.

## 2015-10-29 NOTE — Telephone Encounter (Signed)
Spoke to patient and she would like to try the nitro patches. Went over risks. [facility] [address] [employer].

## 2015-10-29 NOTE — Telephone Encounter (Signed)
Is the MRI part of this phone note old?  The MRI results were reviewed 2 weeks ago following the study.

## 2015-10-29 NOTE — Telephone Encounter (Signed)
Ok - sent them in.  Thanks!

## 2015-10-29 NOTE — Telephone Encounter (Signed)
I would prefer to do the nitro patches first - there's more data to them, not invasive.  1/4th patch over affected area of ankle, change daily.  Risks include skin irritation, headache (usually very mild, stop getting them after a few days).  If she's ok with this, let me know pharmacy and i'll send it in.

## 2015-10-29 NOTE — Telephone Encounter (Signed)
Notified pt. 

## 2015-10-30 ENCOUNTER — Inpatient Hospital Stay: Admission: RE | Admit: 2015-10-30 | Payer: BLUE CROSS/BLUE SHIELD | Source: Ambulatory Visit

## 2015-11-01 ENCOUNTER — Ambulatory Visit (INDEPENDENT_AMBULATORY_CARE_PROVIDER_SITE_OTHER): Payer: BLUE CROSS/BLUE SHIELD

## 2015-11-01 ENCOUNTER — Telehealth: Payer: Self-pay | Admitting: Cardiology

## 2015-11-01 ENCOUNTER — Telehealth: Payer: Self-pay

## 2015-11-01 DIAGNOSIS — L603 Nail dystrophy: Secondary | ICD-10-CM

## 2015-11-01 DIAGNOSIS — R0600 Dyspnea, unspecified: Secondary | ICD-10-CM

## 2015-11-01 DIAGNOSIS — B351 Tinea unguium: Secondary | ICD-10-CM

## 2015-11-01 NOTE — Telephone Encounter (Signed)
Released to MyChart: "I tried to call but your voice-mailbox is full. I just wanted to let you know that Dr. Radford Pax said I could make a referral to pulmonary for you. I will place the order and you will be called to schedule an appointment. Do not hesitate to call if you have any further questions!"  Referral placed.

## 2015-11-01 NOTE — Telephone Encounter (Signed)
-----   Message from Sueanne Margarita, MD sent at 10/31/2015  9:07 PM EDT ----- Ok to refer to pulmonary

## 2015-11-01 NOTE — Progress Notes (Signed)
She presents today for Laser therapy   Objective: Onychomycosis.  Assessment: Onychomycosis 5th nails bilateral  Plan: Laser therapy administered to bilateral 5th nails and other nails as prophylactic measurement. She stated that due to her hormone therapy she did not want to take the oral medication for nail fungus treatment at this time. All safety precautions were in place. Pt tolerated procedure well. Re-appointed to follow up in 1 month for 2nd of 6 laser treatments.

## 2015-11-01 NOTE — Telephone Encounter (Signed)
Informed patient that her VM is full and that a MyChart message was sent informing her of pulmonary referral. Patient was grateful for call.

## 2015-11-01 NOTE — Telephone Encounter (Signed)
New message   Pt is calling she verbalized that she is returning call for rn

## 2015-11-02 ENCOUNTER — Ambulatory Visit (INDEPENDENT_AMBULATORY_CARE_PROVIDER_SITE_OTHER): Payer: BLUE CROSS/BLUE SHIELD | Admitting: Medical

## 2015-11-02 ENCOUNTER — Encounter: Payer: Self-pay | Admitting: Medical

## 2015-11-02 VITALS — BP 116/74 | HR 86 | Temp 97.7°F | Ht 64.0 in | Wt 211.0 lb

## 2015-11-02 DIAGNOSIS — J0100 Acute maxillary sinusitis, unspecified: Secondary | ICD-10-CM

## 2015-11-02 DIAGNOSIS — H6982 Other specified disorders of Eustachian tube, left ear: Secondary | ICD-10-CM | POA: Diagnosis not present

## 2015-11-02 DIAGNOSIS — J309 Allergic rhinitis, unspecified: Secondary | ICD-10-CM

## 2015-11-02 MED ORDER — DOXYCYCLINE HYCLATE 100 MG PO TABS: 100.0000 mg | ORAL_TABLET | Freq: Two times a day (BID) | ORAL | Status: DC

## 2015-11-02 MED ORDER — AZELASTINE HCL 0.1 % NA SOLN: 2.0000 | Freq: Two times a day (BID) | NASAL | Status: DC

## 2015-11-02 MED ORDER — BENZONATATE 100 MG PO CAPS: 100.0000 mg | ORAL_CAPSULE | Freq: Three times a day (TID) | ORAL | Status: DC | PRN

## 2015-11-02 MED ORDER — FLUTICASONE PROPIONATE 50 MCG/ACT NA SUSP: 2.0000 | Freq: Every day | NASAL | Status: DC

## 2015-11-02 NOTE — Patient Instructions (Addendum)
For sinus infection rx doxycycline antibioitic(rx advisement given)  For some allergic component early on rx flonase(astelin if needed as well). This may help with ear pain as well.  For cough rx benzonatate.  Follow up in 7 days or as needed

## 2015-11-02 NOTE — Progress Notes (Signed)
Pre visit review using our clinic review tool, if applicable. No additional management support is needed unless otherwise documented below in the visit note. 

## 2015-11-02 NOTE — Progress Notes (Signed)
Subjective:    Patient ID: Lorraine Conrad, female    DOB: [birth year], 45 y.o.   MRN: [id number]  HPI  Pt in for with some fascial/sinus pressure pain. Some left ear pain. Pt began to get some nasal congestion first about 10 days. Some sneezing and itching eyes at onset. Some productive cough. Some left side teeth type fain discomfort.       Review of Systems  Constitutional: Negative for fever, chills and fatigue.  HENT: Positive for ear pain, sinus pressure and sneezing. Negative for congestion, hearing loss, postnasal drip, rhinorrhea and sore throat.   Respiratory: Positive for cough. Negative for chest tightness, shortness of breath and wheezing.   Cardiovascular: Negative for chest pain and palpitations.  Gastrointestinal: Negative for nausea, abdominal pain and diarrhea.  Musculoskeletal: Negative for back pain.  Skin: Negative for rash.  Neurological: Negative for dizziness, seizures, speech difficulty and headaches.  Hematological: Negative for adenopathy. Does not bruise/bleed easily.  Psychiatric/Behavioral: Negative for behavioral problems and confusion.    Past Medical History  Diagnosis Date  . GERD (gastroesophageal reflux disease)   . Endometriosis   . Ovarian cyst, left   . External hemorrhoids   . Erosive gastritis   . Proctalgia fugax   . Allergy     SEASONAL  . Anemia   . OSA on CPAP   . Migraine 1998; 02/2014    "this one's lasted 10 days straight" (03/01/2014)  . Headache     "probably monthly" (03/01/2014)  . Arthritis     "spine" (03/01/2014)  . Chronic lower back pain   . H/O shortness of breath 2014    Cardiopulmonary exercise test results     Social History   Social History  . Marital Status: Married    Spouse Name: N/A  . Number of Children: N/A  . Years of Education: N/A   Occupational History  . Not on file.   Social History Main Topics  . Smoking status: Never Smoker   . Smokeless tobacco: Never Used  . Alcohol Use: 0.0 oz/week      0 Standard drinks or equivalent per week     Comment: 03/01/2014 "I'll have 1-2 drinks maybe 3-4 times/yr"  . Drug Use: No  . Sexual Activity: Yes    Birth Control/ Protection: Surgical   Other Topics Concern  . Not on file   Social History Narrative   Daily caffiene use: 6 cups per day   Patient does not get regular excercise    Past Surgical History  Procedure Laterality Date  . Cesarean section  2002; 2006  . Temporomandibular joint surgery  ~ 1992  . Nasal septum surgery  ~ 1991  . Laparoscopic ovarian cystectomy  ~ 1999  . Upper gastrointestinal endoscopy    . Colonoscopy    . Abdominal hysterectomy  04/2013  . Breast biopsy Bilateral   . Breast lumpectomy Left ~ 1993    Family History  Problem Relation Age of Onset  . Breast cancer Maternal Grandmother   . Colon cancer Neg Hx   . Prostate cancer Paternal Grandfather   . Colon polyps Father   . Heart disease Father     Allergies  Allergen Reactions  . Promethazine Hcl Other (See Comments)    Violent tremors   . Cephalexin Diarrhea and Itching  . Codeine Other (See Comments)    Was a child when she took this.  . Dilaudid [Hydromorphone] Nausea And Vomiting  . Azithromycin Rash  Head to toe rash    Current Outpatient Prescriptions on File Prior to Visit  Medication Sig Dispense Refill  . albuterol (PROVENTIL HFA;VENTOLIN HFA) 108 (90 BASE) MCG/ACT inhaler Inhale 2 puffs into the lungs every 6 (six) hours as needed for wheezing or shortness of breath. 1 Inhaler 3  . azelastine (ASTELIN) 0.1 % nasal spray Place 2 sprays into both nostrils 2 (two) times daily. Use in each nostril as directed 30 mL 3  . beclomethasone (QVAR) 40 MCG/ACT inhaler Inhale 2 puffs into the lungs 2 (two) times daily at 10 AM and 5 PM. 1 Inhaler 3  . diclofenac (VOLTAREN) 75 MG EC tablet Take 1 tablet (75 mg total) by mouth 2 (two) times daily. 30 tablet 1  . lansoprazole (PREVACID) 30 MG capsule Take 30 mg by mouth daily at 12  noon.    . methocarbamol (ROBAXIN) 500 MG tablet Take 1 tablet (500 mg total) by mouth every 6 (six) hours as needed for muscle spasms. 20 tablet 0  . mometasone (NASONEX) 50 MCG/ACT nasal spray USE 2 SPRAYS IN EACH NOSTRIL EVERY DAY 17 g 1  . NEOMYCIN-POLYMYXIN-HYDROCORTISONE (CORTISPORIN) 1 % SOLN otic solution Place 3 drops into the left ear every 8 (eight) hours. 10 mL 0  . nitroGLYCERIN (NITRODUR - DOSED IN MG/24 HR) 0.2 mg/hr patch Apply 1/4th patch to affected area of ankle, change daily 30 patch 1  . sulfamethoxazole-trimethoprim (BACTRIM DS) 800-160 MG tablet Take 1 tablet by mouth 2 (two) times daily. 14 tablet 0   No current facility-administered medications on file prior to visit.    BP 116/74 mmHg  Pulse 86  Temp(Src) 97.7 F (36.5 C) (Oral)  Ht 5\' 4"  (1.626 m)  Wt 211 lb (95.709 kg)  BMI 36.20 kg/m2  SpO2 98%  LMP 02/08/2013      Objective:   Physical Exam  General  Mental Status - Alert. General Appearance - Well groomed. Not in acute distress.  Skin Rashes- No Rashes.  HEENT Head- Normal. Ear Auditory Canal - Left- Normal. Right - Normal.Tympanic Membrane- Left- Normal. Right- Normal. Eye Sclera/Conjunctiva- Left- Normal. Right- Normal. Nose & Sinuses Nasal Mucosa- Left-  Boggy and Congested. Right-  Boggy and  Congested.Bilateral left side  Maxillary sinus pressure but no  frontal sinus pressure. Mouth & Throat Lips: Upper Lip- Normal: no dryness, cracking, pallor, cyanosis, or vesicular eruption. Lower Lip-Normal: no dryness, cracking, pallor, cyanosis or vesicular eruption. Buccal Mucosa- Bilateral- No Aphthous ulcers. Oropharynx- No Discharge or Erythema. Tonsils: Characteristics- Bilateral- No Erythema or Congestion. Size/Enlargement- Bilateral- No enlargement. Discharge- bilateral-None.  Neck Neck- Supple. No Masses.   Chest and Lung Exam Auscultation: Breath Sounds:-Clear even and unlabored.  Cardiovascular Auscultation:Rythm- Regular, rate  and rhythm. Murmurs & Other Heart Sounds:Ausculatation of the heart reveal- No Murmurs.  Lymphatic Head & Neck General Head & Neck Lymphatics: Bilateral: Description- No Localized lymphadenopathy.       Assessment & Plan:  For sinus infection rx doxycycline antiboitic(rx advisement given)  For some allergic component early on rx flonase(astelin if needed as well). This may help with ear pain as well.  For cough rx benzonatate.  Follow up in 7 days or as needed

## 2015-11-06 ENCOUNTER — Telehealth: Payer: Self-pay | Admitting: Medical

## 2015-11-06 NOTE — Telephone Encounter (Signed)
Caller name: Self  Can be reached: [phone]     Reason for call: Patient has not started taking the antibiotic that was rx'd on Friday but pattient has a Tick bite and wants to know if the antibiotic is good for this also

## 2015-11-06 NOTE — Telephone Encounter (Signed)
Please advise 

## 2015-11-06 NOTE — Telephone Encounter (Signed)
Yes the doxycycline antibiotic I rx'd can treat sinus infection. But it also is used to treat lyme disease and [place] spotted fever. So if she take the antibiotic she can  be covered for those potential infections.

## 2015-11-07 NOTE — Telephone Encounter (Signed)
Spoke with pt and she voices understanding. Pt did not have any further questions.

## 2015-11-13 ENCOUNTER — Ambulatory Visit: Payer: BLUE CROSS/BLUE SHIELD

## 2015-11-23 ENCOUNTER — Ambulatory Visit
Admission: RE | Admit: 2015-11-23 | Discharge: 2015-11-23 | Disposition: A | Discharge status: Home / Self Care | Payer: BLUE CROSS/BLUE SHIELD | Source: Ambulatory Visit | Attending: Obstetrics and Gynecology | Admitting: Obstetrics and Gynecology

## 2015-11-23 DIAGNOSIS — Z1231 Encounter for screening mammogram for malignant neoplasm of breast: Secondary | ICD-10-CM | POA: Diagnosis not present

## 2015-11-29 ENCOUNTER — Other Ambulatory Visit: Payer: BLUE CROSS/BLUE SHIELD
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
## 2015-12-04 ENCOUNTER — Ambulatory Visit: Payer: BLUE CROSS/BLUE SHIELD

## 2015-12-04 DIAGNOSIS — B351 Tinea unguium: Secondary | ICD-10-CM

## 2015-12-04 DIAGNOSIS — L603 Nail dystrophy: Secondary | ICD-10-CM

## 2015-12-04 NOTE — Progress Notes (Signed)
She presents today for Laser therapy   Objective: Onychomycosis.  Assessment: Onychomycosis 5th nails bilateral  Plan: Laser therapy administered to bilateral 5th nails and other nails as prophylactic measurement. All safety y precautions were in place. Pt tolerated procedure well. Re-appointed to follow up in 1 month for 3rd of 6 laser treatments.

## 2015-12-21 DIAGNOSIS — L298 Other pruritus: Secondary | ICD-10-CM | POA: Diagnosis not present

## 2015-12-21 DIAGNOSIS — Z6837 Body mass index (BMI) 37.0-37.9, adult: Secondary | ICD-10-CM | POA: Diagnosis not present

## 2015-12-21 DIAGNOSIS — N941 Unspecified dyspareunia: Secondary | ICD-10-CM | POA: Diagnosis not present

## 2015-12-25 ENCOUNTER — Ambulatory Visit (INDEPENDENT_AMBULATORY_CARE_PROVIDER_SITE_OTHER): Payer: BLUE CROSS/BLUE SHIELD | Admitting: Internal Medicine

## 2015-12-25 ENCOUNTER — Encounter: Payer: Self-pay | Admitting: Internal Medicine

## 2015-12-25 VITALS — BP 126/72 | HR 80 | Ht 64.0 in | Wt 213.8 lb

## 2015-12-25 DIAGNOSIS — R0600 Dyspnea, unspecified: Secondary | ICD-10-CM

## 2015-12-25 LAB — NITRIC OXIDE: NITRIC OXIDE: 16

## 2015-12-25 NOTE — Patient Instructions (Signed)
ICD-9-CM ICD-10-CM   1. Dyspnea 786.09 R06.00    Remains unexplained  Plan - do methacholine challenge test -call us [phone] after the test to reiew result - if above test negative then will repeat bike cpst test

## 2015-12-25 NOTE — Progress Notes (Signed)
Subjective:    Patient ID: Lorraine Conrad, female    DOB: 09-14-1970, 45 y.o.   MRN: [id number] PCP Saguier, Percell Miller, PA-C  HPI   IOV 12/25/2015  Chief Complaint  Patient presents with  . Advice Only    Referred by Dr. Radford Pax for worsening intermittent dyspnea Xseveral years.      45 year old female with a BMI of 36.7:. Presents insidious onset of dyspnea for the last 4-5 years. She says that approximately 3 years ago she had a hysterectomy and after that her dyspnea improved for 6 months but then returned. Initially she thought dyspnea was related to her menstrual cycles for the fact it returned after her hysterectomy suggest that it is not according to her. Symptoms are moderate to severe. It is episodic and random. Sometime shortness of breath happens with exertion sometimes not. Sometimes it happens at rest and sometimes not. This no clear-cut aggravating or specific relieving factors. All the rest periodically relieves it. She is not sure if it is progressive but it certainly is significant enough that it is affecting her quality of life. She's had extensive evaluation for this as documented below. She is very frustrated by this. Generally this no cough or wheezing but one time she thought she coughs. Several months ago primary care physician gave her inhalers she thinks that albuterol might help she did not use it in several weeks. 9 she was also given Qvar which has not helped.  Early 2017: saw ENT and says some bx ? Temp aretery being considered  08/13/2012: VO2 max of 83. On the anaerobic threshold. Blunted systolic blood pressure response. RER of 1.06  03/18/2013: ANA antibody negative  05/08/2013: Rheumatoid factor and ANA again negative.  03/01/2014: CSF Lyme negative  05/26/2014: Treadmill exercise test EKG: Achieved 10 mets. Stopped due to. Fatigue. Normal EKG stress test  03/01/2015 Acid: Receptor antibody negative.: Duplex lower extremity ultrasound negative for DVT. Same  day CT angiogram negative for pulmonary embolism. HIV negative on the same day.  05/09/2015: CT cervical spine: Is normal other than straightening of cervical spine  08/23/2015: CT angiogram chest: Negative for pulmonary embolism. Lung parenchyma reported clear although to me looks like air trapping  08/27/2015; hemoglobin 13.2 g percent and stable. Troponin normal.Creatinine 1.04 mg percent. ESR 20  09/13/2015 myocardial perfusion cardiac stress test: Normal ejection fraction 65%. Low risk study.  09/27/2015; transthoracic echocardiogram is normal and normal ejection fraction and mild mitral regurgitation. 1 diastolic dysfunction  94/76/5465: BNP normal 17.6. CT cardiac scoring: Score 0. Lung parenchyma looks clear to me  12/25/2015  - Walking desaturation test 185 feet 3 laps on room air: Did not desaturate. Exhaled nitric oxide test: 16ppb and normal      has a past medical history of GERD (gastroesophageal reflux disease); Endometriosis; Ovarian cyst, left; External hemorrhoids; Erosive gastritis; Proctalgia fugax; Allergy; Anemia; OSA on CPAP; Migraine (1998; 02/2014); Headache; Arthritis; Chronic lower back pain; and H/O shortness of breath (2014).   reports that she has never smoked. She has never used smokeless tobacco.  Past Surgical History  Procedure Laterality Date  . Cesarean section  2002; 2006  . Temporomandibular joint surgery  ~ 1992  . Nasal septum surgery  ~ 1991  . Laparoscopic ovarian cystectomy  ~ 1999  . Upper gastrointestinal endoscopy    . Colonoscopy    . Abdominal hysterectomy  04/2013  . Breast biopsy Bilateral   . Breast lumpectomy Left ~ 1993    Allergies  Allergen Reactions  .  Promethazine Hcl Other (See Comments)    Violent tremors   . Cephalexin Diarrhea and Itching  . Codeine Other (See Comments)    Was a child when she took this.  . Dilaudid [Hydromorphone] Nausea And Vomiting  . Azithromycin Rash    Head to toe rash    Immunization  History  Administered Date(s) Administered  . Tdap 01/16/2014    Family History  Problem Relation Age of Onset  . Breast cancer Maternal Grandmother   . Colon cancer Neg Hx   . Prostate cancer Paternal Grandfather   . Colon polyps Father   . Heart disease Father      Current outpatient prescriptions:  .  albuterol (PROVENTIL HFA;VENTOLIN HFA) 108 (90 BASE) MCG/ACT inhaler, Inhale 2 puffs into the lungs every 6 (six) hours as needed for wheezing or shortness of breath., Disp: 1 Inhaler, Rfl: 3 .  azelastine (ASTELIN) 0.1 % nasal spray, Place 2 sprays into both nostrils 2 (two) times daily. Use in each nostril as directed, Disp: 30 mL, Rfl: 3 .  beclomethasone (QVAR) 40 MCG/ACT inhaler, Inhale 2 puffs into the lungs 2 (two) times daily at 10 AM and 5 PM., Disp: 1 Inhaler, Rfl: 3 .  diclofenac (VOLTAREN) 75 MG EC tablet, Take 1 tablet (75 mg total) by mouth 2 (two) times daily., Disp: 30 tablet, Rfl: 1 .  fluticasone (FLONASE) 50 MCG/ACT nasal spray, Place 2 sprays into both nostrils daily., Disp: 16 g, Rfl: 1 .  lansoprazole (PREVACID) 30 MG capsule, Take 30 mg by mouth daily at 12 noon., Disp: , Rfl:  .  methocarbamol (ROBAXIN) 500 MG tablet, Take 1 tablet (500 mg total) by mouth every 6 (six) hours as needed for muscle spasms., Disp: 20 tablet, Rfl: 0 .  mometasone (NASONEX) 50 MCG/ACT nasal spray, USE 2 SPRAYS IN EACH NOSTRIL EVERY DAY, Disp: 17 g, Rfl: 1 .  nitroGLYCERIN (NITRODUR - DOSED IN MG/24 HR) 0.2 mg/hr patch, Apply 1/4th patch to affected area of ankle, change daily, Disp: 30 patch, Rfl: 1    Review of Systems  Constitutional: Negative for fever and unexpected weight change.  HENT: Positive for congestion and ear pain. Negative for dental problem, nosebleeds, postnasal drip, rhinorrhea, sinus pressure, sneezing, sore throat and trouble swallowing.   Eyes: Negative for redness and itching.  Respiratory: Positive for shortness of breath. Negative for cough, chest tightness  and wheezing.   Cardiovascular: Negative for palpitations and leg swelling.  Gastrointestinal: Negative for nausea and vomiting.  Genitourinary: Negative for dysuria.  Musculoskeletal: Negative for joint swelling.  Skin: Negative for rash.  Neurological: Positive for headaches.  Hematological: Does not bruise/bleed easily.  Psychiatric/Behavioral: Negative for dysphoric mood. The patient is not nervous/anxious.        Objective:   Physical Exam  Constitutional: She is oriented to person, place, and time. She appears well-developed and well-nourished. No distress.  HENT:  Head: Normocephalic and atraumatic.  Right Ear: External ear normal.  Left Ear: External ear normal.  Mouth/Throat: Oropharynx is clear and moist. No oropharyngeal exudate.  Eyes: Conjunctivae and EOM are normal. Pupils are equal, round, and reactive to light. Right eye exhibits no discharge. Left eye exhibits no discharge. No scleral icterus.  Neck: Normal range of motion. Neck supple. No JVD present. No tracheal deviation present. No thyromegaly present.  Cardiovascular: Normal rate, regular rhythm, normal heart sounds and intact distal pulses.  Exam reveals no gallop and no friction rub.   No murmur heard. Pulmonary/Chest: Effort normal  and breath sounds normal. No respiratory distress. She has no wheezes. She has no rales. She exhibits no tenderness.  Abdominal: Soft. Bowel sounds are normal. She exhibits no distension and no mass. There is no tenderness. There is no rebound and no guarding.  Musculoskeletal: Normal range of motion. She exhibits no edema or tenderness.  Lymphadenopathy:    She has no cervical adenopathy.  Neurological: She is alert and oriented to person, place, and time. She has normal reflexes. No cranial nerve deficit. She exhibits normal muscle tone. Coordination normal.  Skin: Skin is warm and dry. No rash noted. She is not diaphoretic. No erythema. No pallor.  Psychiatric: She has a normal  mood and affect. Her behavior is normal. Judgment and thought content normal.  Vitals reviewed.   Filed Vitals:   12/25/15 1528  BP: 126/72  Pulse: 80  Height: '5\' 4"'  (1.626 m)  Weight: 213 lb 12.8 oz (96.979 kg)  SpO2: 99%  Body mass index is 36.68 kg/(m^2).         Assessment & Plan:     ICD-9-CM ICD-10-CM   1. Dyspnea 786.09 R06.00     Remains unexplained  Plan - do methacholine challenge test -call us [phone] after the test to reiew result - if above test negative then will repeat bike cpst test  Dr. Brand Males, M.D., [facility].C.P Pulmonary and Critical Care Medicine Staff Physician [place] [facility] Pager: [phone], If no answer or between  15:00h - 7:00h: call [phone]  12/25/2015 4:23 PM

## 2015-12-31 ENCOUNTER — Ambulatory Visit (HOSPITAL_COMMUNITY)
Admission: RE | Admit: 2015-12-31 | Discharge: 2015-12-31 | Disposition: A | Discharge status: Home / Self Care | Payer: BLUE CROSS/BLUE SHIELD | Source: Ambulatory Visit | Attending: Internal Medicine | Admitting: Internal Medicine

## 2015-12-31 ENCOUNTER — Telehealth: Payer: Self-pay | Admitting: Internal Medicine

## 2015-12-31 DIAGNOSIS — R0600 Dyspnea, unspecified: Secondary | ICD-10-CM | POA: Diagnosis not present

## 2015-12-31 LAB — PULMONARY FUNCTION TEST
FEF 25-75 Post: 3.17 L/sec
FEF 25-75 Pre: 3.5 L/sec
FEF2575-%CHANGE-POST: -9 %
FEF2575-%PRED-POST: 105 %
FEF2575-%Pred-Pre: 116 %
FEV1-%CHANGE-POST: -4 %
FEV1-%Pred-Post: 85 %
FEV1-%Pred-Pre: 88 %
FEV1-POST: 2.52 L
FEV1-PRE: 2.62 L
FEV1FVC-%Change-Post: 0 %
FEV1FVC-%PRED-PRE: 107 %
FEV6-%Change-Post: -5 %
FEV6-%Pred-Post: 78 %
FEV6-%Pred-Pre: 83 %
FEV6-POST: 2.82 L
FEV6-PRE: 2.98 L
FEV6FVC-%PRED-POST: 102 %
FEV6FVC-%PRED-PRE: 102 %
FVC-%CHANGE-POST: -4 %
FVC-%PRED-POST: 77 %
FVC-%PRED-PRE: 81 %
FVC-PRE: 2.98 L
FVC-Post: 2.84 L
PRE FEV6/FVC RATIO: 100 %
Post FEV1/FVC ratio: 89 %
Post FEV6/FVC ratio: 100 %
Pre FEV1/FVC ratio: 88 %

## 2015-12-31 MED ORDER — METHACHOLINE 0.25 MG/ML NEB SOLN
2.0000 mL | Freq: Once | RESPIRATORY_TRACT | Status: AC
  Administered 2015-12-31: 0.5 mg via RESPIRATORY_TRACT

## 2015-12-31 MED ORDER — METHACHOLINE 4 MG/ML NEB SOLN
2.0000 mL | Freq: Once | RESPIRATORY_TRACT | Status: AC
  Administered 2015-12-31: 8 mg via RESPIRATORY_TRACT

## 2015-12-31 MED ORDER — ALBUTEROL SULFATE (2.5 MG/3ML) 0.083% IN NEBU
2.5000 mg | INHALATION_SOLUTION | Freq: Once | RESPIRATORY_TRACT | Status: AC
  Administered 2015-12-31: 2.5 mg via RESPIRATORY_TRACT

## 2015-12-31 MED ORDER — METHACHOLINE 1 MG/ML NEB SOLN
2.0000 mL | Freq: Once | RESPIRATORY_TRACT | Status: AC
  Administered 2015-12-31: 2 mg via RESPIRATORY_TRACT

## 2015-12-31 MED ORDER — METHACHOLINE 16 MG/ML NEB SOLN
2.0000 mL | Freq: Once | RESPIRATORY_TRACT | Status: AC
  Administered 2015-12-31: 32 mg via RESPIRATORY_TRACT

## 2015-12-31 MED ORDER — METHACHOLINE 0.0625 MG/ML NEB SOLN
2.0000 mL | Freq: Once | RESPIRATORY_TRACT | Status: AC
  Administered 2015-12-31: 0.125 mg via RESPIRATORY_TRACT

## 2015-12-31 MED ORDER — SODIUM CHLORIDE 0.9 % IN NEBU
3.0000 mL | INHALATION_SOLUTION | Freq: Once | RESPIRATORY_TRACT | Status: AC
  Administered 2015-12-31: 3 mL via RESPIRATORY_TRACT

## 2015-12-31 NOTE — Telephone Encounter (Signed)
Pt calling to inform Dr. Chase Caller that she had the PFT done today. Please advise thanks

## 2016-01-01 NOTE — Telephone Encounter (Signed)
Methacholine challenge negative for asthma - consistent with low feno. She might have vocal cord fluctuation  But not sure.  Plan  - 1. ASk her if she has ever seen ENT doc? - 2. SEt up for CPST bike test - no need for eib challenge - with Landis Martins at [facility]. ROV with me < 2 weeks after finishing CPST  THanks  Dr. Brand Males, M.D., [facility].C.P Pulmonary and Critical Care Medicine Staff Physician [place] [facility] Pager: [phone], If no answer or between  15:00h - 7:00h: call [phone]  01/01/2016 9:59 AM

## 2016-01-01 NOTE — Telephone Encounter (Signed)
Called spoke with pt. She states that she did see an ENT within the last 3 months but she is unsure of the doctor's name. I reviewed MR's recs. CPST has been scheduled and ov with MR has been made for 02/01/16 due to pt's kids school schedule. She voiced understanding and had no further questions.  Nothing further needed at this time.

## 2016-01-03 ENCOUNTER — Other Ambulatory Visit: Payer: BLUE CROSS/BLUE SHIELD

## 2016-01-04 ENCOUNTER — Encounter (HOSPITAL_COMMUNITY): Payer: BLUE CROSS/BLUE SHIELD

## 2016-01-07 ENCOUNTER — Encounter (HOSPITAL_COMMUNITY): Payer: BLUE CROSS/BLUE SHIELD

## 2016-01-08 ENCOUNTER — Telehealth: Payer: Self-pay

## 2016-01-08 NOTE — Telephone Encounter (Signed)
Attempted to call the patient on her home phone and cell phone, mail box was full and I was unable to leave a VM to answer her questions regarding the Laser and the delay of treatment due to Laser damage during shipping for yearly service.

## 2016-01-11 ENCOUNTER — Ambulatory Visit (HOSPITAL_COMMUNITY): Payer: BLUE CROSS/BLUE SHIELD | Attending: Medical

## 2016-01-11 ENCOUNTER — Other Ambulatory Visit (HOSPITAL_COMMUNITY): Payer: Self-pay | Admitting: *Deleted

## 2016-01-11 DIAGNOSIS — R0600 Dyspnea, unspecified: Secondary | ICD-10-CM | POA: Insufficient documentation

## 2016-01-14 DIAGNOSIS — R0600 Dyspnea, unspecified: Secondary | ICD-10-CM | POA: Diagnosis not present

## 2016-01-15 NOTE — Progress Notes (Signed)
Called and spoke to pt. Informed her of the results and recs per MR. Pt verbalized understanding and denied any further questions or concerns at this time.   

## 2016-01-17 ENCOUNTER — Other Ambulatory Visit: Payer: BLUE CROSS/BLUE SHIELD

## 2016-01-17 ENCOUNTER — Telehealth: Payer: Self-pay | Admitting: Family Medicine

## 2016-01-18 NOTE — Telephone Encounter (Signed)
Yes, unless she did something new to her ankle I would try using the patch again.

## 2016-01-18 NOTE — Telephone Encounter (Signed)
Spoke to patient and told her that she could start using the patch on her ankle again.

## 2016-01-28 DIAGNOSIS — Z01419 Encounter for gynecological examination (general) (routine) without abnormal findings: Secondary | ICD-10-CM | POA: Diagnosis not present

## 2016-01-28 DIAGNOSIS — Z6836 Body mass index (BMI) 36.0-36.9, adult: Secondary | ICD-10-CM | POA: Diagnosis not present

## 2016-01-31 ENCOUNTER — Telehealth: Payer: Self-pay | Admitting: Internal Medicine

## 2016-01-31 DIAGNOSIS — R0600 Dyspnea, unspecified: Secondary | ICD-10-CM

## 2016-01-31 NOTE — Telephone Encounter (Signed)
Will try to call 02/01/16 . Send it back aftter you tell. IF not early next week I can call

## 2016-01-31 NOTE — Telephone Encounter (Signed)
Called and spoke with pt and advised that MR will try and call her tomorrow or early next week. Re-reviewed CPST results with pt and gave MR's recommendations for weight loss and conditioning. She was unsure about the "diastolic dysfunction" part so that was explained. Pt feels much better about results, but would still like MR to call her.  Routed back to MR as reminder to call pt.

## 2016-01-31 NOTE — Telephone Encounter (Signed)
Called and spoke with pt and she stated that our office called and cancelled her appt for 8/25 with MR---she was coming to get results that day.  Pt stated that she did not want to wait until the next appt on 9/5 with MR---MR pt is requesting that you call her with results today please. thanks

## 2016-02-01 ENCOUNTER — Ambulatory Visit: Payer: BLUE CROSS/BLUE SHIELD | Admitting: Internal Medicine

## 2016-02-01 NOTE — Telephone Encounter (Signed)
Called 12:26 PM 02/01/2016 and phone kept on ringing. Diast dysfn in when heart muscledoes not relax well and usual reason are weight and high bp. My rec is pulm rehab if she can get the time do it. Indication: dyspnea, disat dysnf. IF she is willing to go to rehab- > then ROV 3 months or now/prn  Dr. Brand Males, M.D., [facility].C.P Pulmonary and Critical Care Medicine Staff Physician [place] [facility] Pager: [phone], If no answer or between  15:00h - 7:00h: call [phone]  02/01/2016 12:28 PM

## 2016-02-04 NOTE — Telephone Encounter (Signed)
Spoke with pt and advised of MR's recommendations.  Order placed for Pulmonary rehab.

## 2016-02-04 NOTE — Addendum Note (Signed)
Addended by: Collier Salina on: 02/04/2016 04:51 PM   Modules accepted: Orders

## 2016-02-04 NOTE — Telephone Encounter (Signed)
ATC pt. Mailbox full. WCB.

## 2016-02-04 NOTE — Addendum Note (Signed)
Addended by: Doroteo Glassman D on: 02/04/2016 03:21 PM   Modules accepted: Orders

## 2016-02-05 ENCOUNTER — Ambulatory Visit: Payer: BLUE CROSS/BLUE SHIELD

## 2016-02-05 DIAGNOSIS — L603 Nail dystrophy: Secondary | ICD-10-CM

## 2016-02-05 NOTE — Progress Notes (Signed)
She presents today for Laser therapy   Objective: Onychomycosis.  Assessment: Onychomycosis 5th nails bilateral  Plan: Laser therapy administered to bilateral 5th nails and other nails as prophylactic measurement. All safety y precautions were in place. Pt tolerated procedure well. Re-appointed to follow up in 1 month for 4th laser. Due to delay in Laser treatment, pt was advised to restart laser therapy, an additional 2 laser treatments will be give at no charge to patient

## 2016-02-08 ENCOUNTER — Encounter (HOSPITAL_COMMUNITY): Payer: Self-pay

## 2016-02-08 ENCOUNTER — Encounter (HOSPITAL_COMMUNITY)
Admission: RE | Admit: 2016-02-08 | Discharge: 2016-02-08 | Disposition: A | Discharge status: Home / Self Care | Payer: BLUE CROSS/BLUE SHIELD | Source: Ambulatory Visit | Attending: Internal Medicine | Admitting: Internal Medicine

## 2016-02-08 VITALS — BP 139/93 | HR 70 | Resp 18 | Ht 63.75 in | Wt 212.1 lb

## 2016-02-08 DIAGNOSIS — R0600 Dyspnea, unspecified: Secondary | ICD-10-CM | POA: Insufficient documentation

## 2016-02-08 NOTE — Progress Notes (Signed)
Lorraine Conrad 45 y.o. female Pulmonary Rehab Orientation Note Patient arrived today in [facility] for orientation to Pulmonary Rehab. She ambulated from [employer]. She has not been prescribed oxygen for home or travel use. She does not tolerate her cpap at night but after educating patient on potential negative consequences of non-compliance, she states she would be compliant. Color good, skin warm and dry. Patient is oriented to time and place. Patient's medical history, psychosocial health, and medications reviewed. Psychosocial assessment reveals pt lives with their family. She is married and has two school age children. Pt is currently unemployed. 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 She hopes to soon return to this work during the spring market of 2018. Pt hobbies include painting. Pt reports her stress level is moderate. Areas of stress/anxiety include Health. She has had a complete workup for her shortness of breath including a cardiac workup and frustrated that all test are negative yet she still has shortness of breath. Per EMR, most likely to obesity and diastolic dysfunction.  Pt does exhibit signs of depression, however she feels she is not depressed. Signs of depression include crying spells. This may also be due to her complete hysterectomy without hormone replacement. PHQ2/9 score 0/na. Pt shows fair  coping skills with positive outlook. She was offered emotional support and reassurance. Will continue to monitor and evaluate progress toward psychosocial goal(s) of remaining positive about her diagnosis or the lack of. Physical assessment reveals heart rate is normal, breath sounds clear to auscultation, no wheezes, rales, or rhonchi. Grip strength equal, strong. Distal pulses palpable. Mild non-pitting edema noted to ankles bilat.Patient reports she does not take medications as prescribed. She is  encouraged to take meds as prescribed. She states she does not like to "put things in her body". Patient states she follows a Regular diet. The patient has been trying to lose weight through a healthy diet and exercise program. Patient's weight will be monitored closely. Demonstration and practice of PLB using pulse oximeter. Patient able to return demonstration satisfactorily. Safety and hand hygiene in the exercise area reviewed with patient. Patient voices understanding of the information reviewed. Department expectations discussed with patient and achievable goals were set. The patient shows enthusiasm about attending the program and we look forward to working with this nice lady. The patient is scheduled for a 6 min walk test on 02/12/16 and to begin exercise on 02/19/16 in the 1:30 class.   45 minutes was spent on a variety of activities such as assessment of the patient, obtaining baseline data including height, weight, BMI, and grip strength, verifying medical history, allergies, and current medications, and teaching patient strategies for performing tasks with less respiratory effort with emphasis on pursed lip breathing.

## 2016-02-12 ENCOUNTER — Encounter (HOSPITAL_COMMUNITY)
Admission: RE | Admit: 2016-02-12 | Discharge: 2016-02-12 | Disposition: A | Discharge status: Home / Self Care | Payer: BLUE CROSS/BLUE SHIELD | Source: Ambulatory Visit | Attending: Internal Medicine | Admitting: Internal Medicine

## 2016-02-12 DIAGNOSIS — R0600 Dyspnea, unspecified: Secondary | ICD-10-CM | POA: Diagnosis not present

## 2016-02-13 ENCOUNTER — Ambulatory Visit (INDEPENDENT_AMBULATORY_CARE_PROVIDER_SITE_OTHER): Payer: BLUE CROSS/BLUE SHIELD | Admitting: Medical

## 2016-02-13 VITALS — BP 125/74 | HR 78 | Temp 98.6°F | Ht 64.0 in | Wt 210.2 lb

## 2016-02-13 DIAGNOSIS — R197 Diarrhea, unspecified: Secondary | ICD-10-CM

## 2016-02-13 DIAGNOSIS — R1013 Epigastric pain: Secondary | ICD-10-CM

## 2016-02-13 NOTE — Patient Instructions (Addendum)
You may have had severe gerd flare after not being on prevacid and eating greasy/fried foods. So can continue both prevacid and zantac. Restart healthy diet.  Eat healthy diet.  Will also get labs today since your level of pain you described was quite high at time of pain.  Your looses stools are now resolved so no testing needed. You may have also had food poison vs brief course viral gastroenteritis.  Follow up 2-3 weeks or as needed

## 2016-02-13 NOTE — Progress Notes (Signed)
Subjective:    Patient ID: Lorraine Conrad, female    DOB: [birth year], 45 y.o.   MRN: [id number]  HPI  Pt in today stating this Friday after eating asian food  and drinking some aquafina felt severe abdomen pain. She had a lot pain. She felt pain in epigastric area with pain toward upper esophagus(burning pain/discomfort). She pointed suprasternal area. Pt had previously had recently stopped her prevacid of maybe for 10 days. The night pain started she had [place] asian with Hoisen sauce and some sweet and sour chicken. Pt states all weekend felt stomach cramps. Whatever she ate was hurting her all weekend. Sunday evening started to feels some better.  Pt thinks started prevacid and zantac on Saturday.  Pt also had diarhea on first day of pain. Some looses stools on Saturday as well. Sunday started to get better with loose stools.    Review of Systems  Constitutional: Negative for chills, fatigue and fever.  Respiratory: Negative for cough, chest tightness, shortness of breath and wheezing.   Cardiovascular: Negative for chest pain and palpitations.  Gastrointestinal: Positive for abdominal pain. Negative for blood in stool, constipation, diarrhea, nausea and vomiting.       Much improved. Now.   Musculoskeletal: Negative for back pain.       No pain now but on Friday when had pain in abdomen some pain medial to rt scapula.  Neurological: Negative for dizziness and headaches.  Hematological: Negative for adenopathy. Does not bruise/bleed easily.  Psychiatric/Behavioral: Negative for behavioral problems and confusion.    Past Medical History:  Diagnosis Date  . Allergy    SEASONAL  . Anemia   . Arthritis    "spine" (03/01/2014)  . Chronic lower back pain   . Endometriosis   . Erosive gastritis   . External hemorrhoids   . GERD (gastroesophageal reflux disease)   . H/O shortness of breath 2014   Cardiopulmonary exercise test results  . Headache    "probably monthly" (03/01/2014)    . Migraine 1998; 02/2014   "this one's lasted 10 days straight" (03/01/2014)  . OSA on CPAP   . Ovarian cyst, left   . Proctalgia fugax      Social History   Social History  . Marital status: Married    Spouse name: N/A  . Number of children: N/A  . Years of education: N/A   Occupational History  . Not on file.   Social History Main Topics  . Smoking status: Never Smoker  . Smokeless tobacco: Never Used     Comment: father smoked in house growing up per pt.   . Alcohol use 0.0 oz/week     Comment: 03/01/2014 "I'll have 1-2 drinks maybe 3-4 times/yr"  . Drug use: No  . Sexual activity: Yes    Birth control/ protection: Surgical   Other Topics Concern  . Not on file   Social History Narrative   Daily caffiene use: 6 cups per day   Patient does not get regular excercise    Past Surgical History:  Procedure Laterality Date  . ABDOMINAL HYSTERECTOMY  04/2013  . BREAST BIOPSY Bilateral   . BREAST LUMPECTOMY Left ~ 1993  . CESAREAN SECTION  2002; 2006  . COLONOSCOPY    . LAPAROSCOPIC OVARIAN CYSTECTOMY  ~ 1999  . NASAL SEPTUM SURGERY  ~ 1991  . TEMPOROMANDIBULAR JOINT SURGERY  ~ 47  . UPPER GASTROINTESTINAL ENDOSCOPY      Family History  Problem Relation Age of Onset  .  Breast cancer Maternal Grandmother   . Prostate cancer Paternal Grandfather   . Colon polyps Father   . Heart disease Father   . Colon cancer Neg Hx     Allergies  Allergen Reactions  . Promethazine Hcl Other (See Comments)    Violent tremors   . Cephalexin Diarrhea and Itching  . Codeine Other (See Comments)    Was a child when she took this.  . Dilaudid [Hydromorphone] Nausea And Vomiting  . Azithromycin Rash    Head to toe rash    Current Outpatient Prescriptions on File Prior to Visit  Medication Sig Dispense Refill  . conjugated estrogens (PREMARIN) vaginal cream Use one time per weekly at bedtime-one fourth applicator vaginally    . cyclobenzaprine (FLEXERIL) 10 MG tablet Take  10 mg by mouth.    . fluticasone (FLONASE) 50 MCG/ACT nasal spray Place 2 sprays into both nostrils daily. 16 g 1  . lansoprazole (PREVACID) 30 MG capsule Take 30 mg by mouth daily at 12 noon.    . methocarbamol (ROBAXIN) 500 MG tablet Take 1 tablet (500 mg total) by mouth every 6 (six) hours as needed for muscle spasms. 20 tablet 0  . mometasone (NASONEX) 50 MCG/ACT nasal spray USE 2 SPRAYS IN EACH NOSTRIL EVERY DAY 17 g 1  . albuterol (PROVENTIL HFA;VENTOLIN HFA) 108 (90 BASE) MCG/ACT inhaler Inhale 2 puffs into the lungs every 6 (six) hours as needed for wheezing or shortness of breath. 1 Inhaler 3  . azelastine (ASTELIN) 0.1 % nasal spray Place 2 sprays into both nostrils 2 (two) times daily. Use in each nostril as directed (Patient not taking: Reported on 02/08/2016) 30 mL 3  . beclomethasone (QVAR) 40 MCG/ACT inhaler Inhale 2 puffs into the lungs 2 (two) times daily at 10 AM and 5 PM. (Patient not taking: Reported on 02/08/2016) 1 Inhaler 3  . diclofenac (VOLTAREN) 75 MG EC tablet Take 1 tablet (75 mg total) by mouth 2 (two) times daily. (Patient not taking: Reported on 02/08/2016) 30 tablet 1  . nitroGLYCERIN (NITRODUR - DOSED IN MG/24 HR) 0.2 mg/hr patch Apply 1/4th patch to affected area of ankle, change daily (Patient not taking: Reported on 02/08/2016) 30 patch 1  . sertraline (ZOLOFT) 50 MG tablet Take 50 mg by mouth.     No current facility-administered medications on file prior to visit.     BP 125/74   Pulse 78   Temp 98.6 F (37 C) (Oral)   Ht 5\' 4"  (1.626 m)   Wt 210 lb 3.2 oz (95.3 kg)   LMP 02/08/2013   SpO2 100%   BMI 36.08 kg/m       Objective:   Physical Exam  General Appearance- Not in acute distress.  HEENT Eyes- Scleraeral/Conjuntiva-bilat- Not Yellow. Mouth & Throat- Normal.  Chest and Lung Exam Auscultation: Breath sounds:-Normal. Adventitious sounds:- No Adventitious sounds.  Cardiovascular Auscultation:Rythm - Regular. Heart Sounds -Normal heart  sounds.  Abdomen Inspection:-Inspection Normal.  Palpation/Perucssion: Palpation and Percussion of the abdomen reveal-  very faint epigastric Tenderness, No Rebound tenderness, No rigidity(Guarding) and No Palpable abdominal masses.  Liver:-Normal.  Spleen:- Normal.    Back- no cva tenderness.      Assessment & Plan:  You may have had severe gerd flare after not being on prevacid and eating greasy/fried foods. So can continue both prevacid and zantac. Restart healthy diet.   Will also get labs today since your level of pain you described was quite high at the time of  pain.  Your looses stools are now resolved so no testing needed. You may have also had food poison vs brief course viral gastroenteritis.  Follow up 2-3 weeks or as needed

## 2016-02-14 LAB — COMPREHENSIVE METABOLIC PANEL
ALBUMIN: 4.4 g/dL (ref 3.5–5.2)
ALK PHOS: 79 U/L (ref 39–117)
ALT: 9 U/L (ref 0–35)
AST: 13 U/L (ref 0–37)
BUN: 14 mg/dL (ref 6–23)
CO2: 30 mEq/L (ref 19–32)
CREATININE: 0.89 mg/dL (ref 0.40–1.20)
Calcium: 9.2 mg/dL (ref 8.4–10.5)
Chloride: 102 mEq/L (ref 96–112)
GFR: 73.38 mL/min (ref >60.00)
GLUCOSE: 83 mg/dL (ref 70–99)
POTASSIUM: 4.5 meq/L (ref 3.5–5.1)
SODIUM: 135 meq/L (ref 135–145)
TOTAL PROTEIN: 7.7 g/dL (ref 6.0–8.3)
Total Bilirubin: 0.3 mg/dL (ref 0.2–1.2)

## 2016-02-14 LAB — CBC WITH DIFFERENTIAL/PLATELET
BASOS ABS: 0 10*3/uL (ref 0.0–0.1)
Basophils Relative: 0.4 % (ref 0.0–3.0)
EOS ABS: 0.2 10*3/uL (ref 0.0–0.7)
Eosinophils Relative: 2.4 % (ref 0.0–5.0)
HCT: 39.8 % (ref 36.0–46.0)
HEMOGLOBIN: 13.4 g/dL (ref 12.0–15.0)
LYMPHS ABS: 2.2 10*3/uL (ref 0.7–4.0)
Lymphocytes Relative: 22.5 % (ref 12.0–46.0)
MCHC: 33.6 g/dL (ref 30.0–36.0)
MCV: 83.4 fl (ref 78.0–100.0)
MONO ABS: 0.6 10*3/uL (ref 0.1–1.0)
Monocytes Relative: 6.2 % (ref 3.0–12.0)
NEUTROS PCT: 68.5 % (ref 43.0–77.0)
Neutro Abs: 6.8 10*3/uL (ref 1.4–7.7)
Platelets: 423 10*3/uL — ABNORMAL HIGH (ref 150.0–400.0)
RBC: 4.78 Mil/uL (ref 3.87–5.11)
RDW: 14.5 % (ref 11.5–15.5)
WBC: 9.9 10*3/uL (ref 4.0–10.5)

## 2016-02-14 LAB — LIPASE: Lipase: 19 U/L (ref 11.0–59.0)

## 2016-02-14 LAB — AMYLASE: AMYLASE: 33 U/L (ref 27–131)

## 2016-02-14 LAB — H. PYLORI BREATH TEST: H. pylori Breath Test: NOT DETECTED

## 2016-02-14 NOTE — Progress Notes (Signed)
Pulmonary Individual Treatment Plan  Patient Details  Name: Lorraine Conrad MRN: [id number] Date of Birth: 1970-10-05 Referring Provider:   April Manson Pulmonary Rehab Walk Test from 02/12/2016 in [place]  Referring Provider  Dr. Chase Caller      Initial Encounter Date:  Flowsheet Row Pulmonary Rehab Walk Test from 02/12/2016 in [place]  Date  02/12/16  Referring Provider  Dr. Chase Caller      Visit Diagnosis: No diagnosis found.  Patient's Home Medications on Admission:   Current Outpatient Prescriptions:  .  albuterol (PROVENTIL HFA;VENTOLIN HFA) 108 (90 BASE) MCG/ACT inhaler, Inhale 2 puffs into the lungs every 6 (six) hours as needed for wheezing or shortness of breath., Disp: 1 Inhaler, Rfl: 3 .  azelastine (ASTELIN) 0.1 % nasal spray, Place 2 sprays into both nostrils 2 (two) times daily. Use in each nostril as directed (Patient not taking: Reported on 02/08/2016), Disp: 30 mL, Rfl: 3 .  beclomethasone (QVAR) 40 MCG/ACT inhaler, Inhale 2 puffs into the lungs 2 (two) times daily at 10 AM and 5 PM. (Patient not taking: Reported on 02/08/2016), Disp: 1 Inhaler, Rfl: 3 .  conjugated estrogens (PREMARIN) vaginal cream, Use one time per weekly at bedtime-one fourth applicator vaginally, Disp: , Rfl:  .  cyclobenzaprine (FLEXERIL) 10 MG tablet, Take 10 mg by mouth., Disp: , Rfl:  .  diclofenac (VOLTAREN) 75 MG EC tablet, Take 1 tablet (75 mg total) by mouth 2 (two) times daily. (Patient not taking: Reported on 02/08/2016), Disp: 30 tablet, Rfl: 1 .  fluticasone (FLONASE) 50 MCG/ACT nasal spray, Place 2 sprays into both nostrils daily., Disp: 16 g, Rfl: 1 .  lansoprazole (PREVACID) 30 MG capsule, Take 30 mg by mouth daily at 12 noon., Disp: , Rfl:  .  methocarbamol (ROBAXIN) 500 MG tablet, Take 1 tablet (500 mg total) by mouth every 6 (six) hours as needed for muscle spasms., Disp: 20 tablet, Rfl: 0 .  mometasone (NASONEX) 50 MCG/ACT  nasal spray, USE 2 SPRAYS IN EACH NOSTRIL EVERY DAY, Disp: 17 g, Rfl: 1 .  nitroGLYCERIN (NITRODUR - DOSED IN MG/24 HR) 0.2 mg/hr patch, Apply 1/4th patch to affected area of ankle, change daily (Patient not taking: Reported on 02/08/2016), Disp: 30 patch, Rfl: 1 .  sertraline (ZOLOFT) 50 MG tablet, Take 50 mg by mouth., Disp: , Rfl:   Past Medical History: Past Medical History:  Diagnosis Date  . Allergy    SEASONAL  . Anemia   . Arthritis    "spine" (03/01/2014)  . Chronic lower back pain   . Endometriosis   . Erosive gastritis   . External hemorrhoids   . GERD (gastroesophageal reflux disease)   . H/O shortness of breath 2014   Cardiopulmonary exercise test results  . Headache    "probably monthly" (03/01/2014)  . Migraine 1998; 02/2014   "this one's lasted 10 days straight" (03/01/2014)  . OSA on CPAP   . Ovarian cyst, left   . Proctalgia fugax     Tobacco Use: History  Smoking Status  . Never Smoker  Smokeless Tobacco  . Never Used    Comment: father smoked in house growing up per pt.     Labs: Recent Review Flowsheet Data    Labs for ITP Cardiac and Pulmonary Rehab Latest Ref Rng & Units 06/19/2008 01/16/2014 03/27/2014   Cholestrol 0 - 200 mg/dL - 223(H) -   LDLCALC 0 - 99 mg/dL - 150(H) -  HDL >39.00 mg/dL - 37.50(L) -   Trlycerides 0.0 - 149.0 mg/dL - 178.0(H) -   Hemoglobin A1c 4.6 - 6.5 % - - 5.4   TCO2 0 - 100 mmol/L 23 - -      Capillary Blood Glucose: Lab Results  Component Value Date   GLUCAP 87 03/01/2015   GLUCAP 88 02/28/2015     ADL UCSD:   Pulmonary Function Assessment:     Pulmonary Function Assessment - 02/08/16 1238      Breath   Bilateral Breath Sounds Clear   Shortness of Breath Yes;Fear of Shortness of Breath;Limiting activity      Exercise Target Goals: Date: 02/12/16  Exercise Program Goal: Individual exercise prescription set with THRR, safety & activity barriers. Participant demonstrates ability to understand and report  RPE using BORG scale, to self-measure pulse accurately, and to acknowledge the importance of the exercise prescription.  Exercise Prescription Goal: Starting with aerobic activity 30 plus minutes a day, 3 days per week for initial exercise prescription. Provide home exercise prescription and guidelines that participant acknowledges understanding prior to discharge.  Activity Barriers & Risk Stratification:     Activity Barriers & Cardiac Risk Stratification - 02/08/16 1236      Activity Barriers & Cardiac Risk Stratification   Activity Barriers Deconditioning;Shortness of Breath      6 Minute Walk:     6 Minute Walk    Row Name 02/14/16 0711         6 Minute Walk   Phase Initial     Distance 1533 feet     Walk Time 6 minutes     # of Rest Breaks 0     MPH 2.9     METS 3.22     RPE 11     Perceived Dyspnea  1     Symptoms No     Resting HR 84 bpm     Resting BP 106/72     Max Ex. HR 117 bpm     Max Ex. BP 116/72     2 Minute Post BP 102/76       Interval HR   Baseline HR 84     1 Minute HR 107     2 Minute HR 110     3 Minute HR 111     4 Minute HR 117     5 Minute HR 116     6 Minute HR 115     2 Minute Post HR 88     Interval Heart Rate? Yes       Interval Oxygen   Interval Oxygen? Yes     Baseline Oxygen Saturation % 97 %     Baseline Liters of Oxygen 0 L     1 Minute Oxygen Saturation % 97 %     1 Minute Liters of Oxygen 0 L     2 Minute Oxygen Saturation % 98 %     2 Minute Liters of Oxygen 0 L     3 Minute Oxygen Saturation % 98 %     3 Minute Liters of Oxygen 0 L     4 Minute Oxygen Saturation % 96 %     4 Minute Liters of Oxygen 0 L     5 Minute Oxygen Saturation % 97 %     5 Minute Liters of Oxygen 0 L     6 Minute Oxygen Saturation % 99 %     6 Minute Liters of Oxygen  0 L     2 Minute Post Oxygen Saturation % 98 %     2 Minute Post Liters of Oxygen 0 L        Initial Exercise Prescription:     Initial Exercise Prescription - 02/14/16  0700      Date of Initial Exercise RX and Referring Provider   Date 02/12/16   Referring Provider Dr. Chase Caller     Bike   Level 0.3   Minutes 17     NuStep   Level 2   Minutes 17   METs 1.5     Track   Laps 6   Minutes 17     Prescription Details   Frequency (times per week) 2   Duration Progress to 45 minutes of aerobic exercise without signs/symptoms of physical distress     Intensity   THRR 40-80% of Max Heartrate 71-142   Ratings of Perceived Exertion 11-13   Perceived Dyspnea 0-4     Progression   Progression Continue progressive overload as per policy without signs/symptoms or physical distress.     Resistance Training   Training Prescription Yes   Weight ORANGE BANDS   Reps 10-12      Perform Capillary Blood Glucose checks as needed.  Exercise Prescription Changes:   Exercise Comments:   Discharge Exercise Prescription (Final Exercise Prescription Changes):    Nutrition:  Target Goals: Understanding of nutrition guidelines, daily intake of sodium 1500mg , cholesterol 200mg , calories 30% from fat and 7% or less from saturated fats, daily to have 5 or more servings of fruits and vegetables.  Biometrics:    Nutrition Therapy Plan and Nutrition Goals:   Nutrition Discharge: Rate Your Plate Scores:   Psychosocial: Target Goals: Acknowledge presence or absence of depression, maximize coping skills, provide positive support system. Participant is able to verbalize types and ability to use techniques and skills needed for reducing stress and depression.  Initial Review & Psychosocial Screening:     Initial Psych Review & Screening - 02/08/16 [place]? Yes     Barriers   Psychosocial barriers to participate in program There are no identifiable barriers or psychosocial needs.     Screening Interventions   Interventions Encouraged to exercise      Quality of Life Scores:   PHQ-9: Recent Review  Flowsheet Data    Depression screen [facility] 2/9 02/08/2016 03/18/2013   Decreased Interest 0 0   Down, Depressed, Hopeless 0 0   PHQ - 2 Score 0 0      Psychosocial Evaluation and Intervention:     Psychosocial Evaluation - 02/08/16 1240      Psychosocial Evaluation & Interventions   Interventions Encouraged to exercise with the program and follow exercise prescription      Psychosocial Re-Evaluation:  Education: Education Goals: Education classes will be provided on a weekly basis, covering required topics. Participant will state understanding/return demonstration of topics presented.  Learning Barriers/Preferences:   Education Topics: Risk Factor Reduction:  -Group instruction that is supported by a PowerPoint presentation. Instructor discusses the definition of a risk factor, different risk factors for pulmonary disease, and how the heart and lungs work together.     Nutrition for Pulmonary Patient:  -Group instruction provided by PowerPoint slides, verbal discussion, and written materials to support subject matter. The instructor gives an explanation and review of healthy diet recommendations, which includes a discussion on weight management, recommendations for fruit and  vegetable consumption, as well as protein, fluid, caffeine, fiber, sodium, sugar, and alcohol. Tips for eating when patients are short of breath are discussed.   Pursed Lip Breathing:  -Group instruction that is supported by demonstration and informational handouts. Instructor discusses the benefits of pursed lip and diaphragmatic breathing and detailed demonstration on how to preform both.     Oxygen Safety:  -Group instruction provided by PowerPoint, verbal discussion, and written material to support subject matter. There is an overview of "What is Oxygen" and "Why do we need it".  Instructor also reviews how to create a safe environment for oxygen use, the importance of using oxygen as prescribed, and the  risks of noncompliance. There is a brief discussion on traveling with oxygen and resources the patient may utilize.   Oxygen Equipment:  -Group instruction provided by [facility] Staff utilizing handouts, written materials, and equipment demonstrations.   Signs and Symptoms:  -Group instruction provided by written material and verbal discussion to support subject matter. Warning signs and symptoms of infection, stroke, and heart attack are reviewed and when to call the physician/911 reinforced. Tips for preventing the spread of infection discussed.   Advanced Directives:  -Group instruction provided by verbal instruction and written material to support subject matter. Instructor reviews Advanced Directive laws and proper instruction for filling out document.   Pulmonary Video:  -Group video education that reviews the importance of medication and oxygen compliance, exercise, good nutrition, pulmonary hygiene, and pursed lip and diaphragmatic breathing for the pulmonary patient.   Exercise for the Pulmonary Patient:  -Group instruction that is supported by a PowerPoint presentation. Instructor discusses benefits of exercise, core components of exercise, frequency, duration, and intensity of an exercise routine, importance of utilizing pulse oximetry during exercise, safety while exercising, and options of places to exercise outside of rehab.     Pulmonary Medications:  -Verbally interactive group education provided by instructor with focus on inhaled medications and proper administration.   Anatomy and Physiology of the Respiratory System and Intimacy:  -Group instruction provided by PowerPoint, verbal discussion, and written material to support subject matter. Instructor reviews respiratory cycle and anatomical components of the respiratory system and their functions. Instructor also reviews differences in obstructive and restrictive respiratory diseases with examples of each. Intimacy,  Sex, and Sexuality differences are reviewed with a discussion on how relationships can change when diagnosed with pulmonary disease. Common sexual concerns are reviewed.   Knowledge Questionnaire Score:   Core Components/Risk Factors/Patient Goals at Admission:     Personal Goals and Risk Factors at Admission - 02/08/16 1240      Core Components/Risk Factors/Patient Goals on Admission    Weight Management Yes;Obesity   Intervention Weight Management: Develop a combined nutrition and exercise program designed to reach desired caloric intake, while maintaining appropriate intake of nutrient and fiber, sodium and fats, and appropriate energy expenditure required for the weight goal.   Expected Outcomes Short Term: Continue to assess and modify interventions until short term weight is achieved;Long Term: Adherence to nutrition and physical activity/exercise program aimed toward attainment of established weight goal;Weight Loss: Understanding of general recommendations for a balanced deficit meal plan, which promotes 1-2 lb weight loss per week and includes a negative energy balance of [phone] kcal/d;Understanding recommendations for meals to include 15-35% energy as protein, 25-35% energy from fat, 35-60% energy from carbohydrates, less than 200mg  of dietary cholesterol, 20-35 gm of total fiber daily   Increase Strength and Stamina Yes   Intervention Provide advice,  education, support and counseling about physical activity/exercise needs.;Develop an individualized exercise prescription for aerobic and resistive training based on initial evaluation findings, risk stratification, comorbidities and participant's personal goals.   Expected Outcomes Achievement of increased cardiorespiratory fitness and enhanced flexibility, muscular endurance and strength shown through measurements of functional capacity and personal statement of participant.   Improve shortness of breath with ADL's Yes   Intervention  Provide education, individualized exercise plan and daily activity instruction to help decrease symptoms of SOB with activities of daily living.   Expected Outcomes Short Term: Achieves a reduction of symptoms when performing activities of daily living.   Develop more efficient breathing techniques such as purse lipped breathing and diaphragmatic breathing; and practicing self-pacing with activity Yes   Intervention Provide education, demonstration and support about specific breathing techniuqes utilized for more efficient breathing. Include techniques such as pursed lipped breathing, diaphragmatic breathing and self-pacing activity.   Expected Outcomes Short Term: Participant will be able to demonstrate and use breathing techniques as needed throughout daily activities.   Increase knowledge of respiratory medications and ability to use respiratory devices properly  Yes   Intervention Provide education and demonstration as needed of appropriate use of medications, inhalers, and oxygen therapy.   Expected Outcomes Short Term: Achieves understanding of medications use. Understands that oxygen is a medication prescribed by physician. Demonstrates appropriate use of inhaler and oxygen therapy.      Core Components/Risk Factors/Patient Goals Review:    Core Components/Risk Factors/Patient Goals at Discharge (Final Review):    ITP Comments:   Comments:

## 2016-02-19 ENCOUNTER — Encounter (HOSPITAL_COMMUNITY)
Admission: RE | Admit: 2016-02-19 | Discharge: 2016-02-19 | Disposition: A | Discharge status: Home / Self Care | Payer: BLUE CROSS/BLUE SHIELD | Source: Ambulatory Visit | Attending: Internal Medicine | Admitting: Internal Medicine

## 2016-02-19 VITALS — Wt 212.3 lb

## 2016-02-19 DIAGNOSIS — R0600 Dyspnea, unspecified: Secondary | ICD-10-CM

## 2016-02-19 NOTE — Progress Notes (Signed)
Daily Session Note  Patient Details  Name: Lorraine Conrad MRN: [id number] Date of Birth: 22-Apr-1971 Referring Provider:   April Manson Pulmonary Rehab Walk Test from 02/12/2016 in [place]  Referring Provider  Dr. Chase Caller      Encounter Date: 02/19/2016  Check In:     Session Check In - 02/19/16 1337      Check-In   Location [facility]   Staff Present Su Hilt, MS, ACSM RCEP, Exercise Physiologist;Kastin Cerda Ysidro Evert, Felipe Drone, RN, MHA;Portia Rollene Rotunda, RN, BSN   Supervising physician immediately available to respond to emergencies [facility] immediately available   Physician(s) Cr. Marily Memos   Medication changes reported     No   Fall or balance concerns reported    No   Warm-up and Cool-down Performed as group-led Location manager Performed Yes   VAD Patient? No     Pain Assessment   Currently in Pain? No/denies   Multiple Pain Sites No      Capillary Blood Glucose: No results found for this or any previous visit (from the past 24 hour(s)).      Exercise Prescription Changes - 02/19/16 1500      Response to Exercise   Blood Pressure (Admit) 116/80   Blood Pressure (Exercise) 156/82   Blood Pressure (Exit) 100/62   Heart Rate (Admit) 80 bpm   Heart Rate (Exercise) 105 bpm   Heart Rate (Exit) 98 bpm   Oxygen Saturation (Admit) 98 %   Oxygen Saturation (Exercise) 97 %   Oxygen Saturation (Exit) 98 %   Rating of Perceived Exertion (Exercise) 15   Perceived Dyspnea (Exercise) 1   Duration Progress to 45 minutes of aerobic exercise without signs/symptoms of physical distress   Intensity --  40-80% of THRR     Progression   Progression Continue to progress workloads to maintain intensity without signs/symptoms of physical distress.     Resistance Training   Training Prescription Yes   Weight orange bands   Reps 10-12  10 minutes of strength training     Bike   Level 0.5   Minutes 17     NuStep   Level 2   Minutes 17   METs 1.8     Track   Laps 18   Minutes 17     Goals Met:  Exercise tolerated well No report of cardiac concerns or symptoms Strength training completed today  Goals Unmet:  Not Applicable  Comments: Service time is from 1330 to 1500    Dr. Rush Farmer is Medical Director for Pulmonary Rehab at [facility].

## 2016-02-21 ENCOUNTER — Encounter (HOSPITAL_COMMUNITY)
Admission: RE | Admit: 2016-02-21 | Discharge: 2016-02-21 | Disposition: A | Discharge status: Home / Self Care | Payer: BLUE CROSS/BLUE SHIELD | Source: Ambulatory Visit | Attending: Internal Medicine | Admitting: Internal Medicine

## 2016-02-21 VITALS — Wt 213.2 lb

## 2016-02-21 DIAGNOSIS — R0600 Dyspnea, unspecified: Secondary | ICD-10-CM

## 2016-02-21 NOTE — Progress Notes (Signed)
Pulmonary Individual Treatment Plan  Patient Details  Name: Lorraine Conrad MRN: [id number] Date of Birth: 06/20/1970 Referring Provider:   April Manson Pulmonary Rehab Walk Test from 02/12/2016 in [place]  Referring Provider  Dr. Chase Caller      Initial Encounter Date:  Flowsheet Row Pulmonary Rehab Walk Test from 02/12/2016 in [place]  Date  02/12/16  Referring Provider  Dr. Chase Caller      Visit Diagnosis: Dyspnea  Patient's Home Medications on Admission:   Current Outpatient Prescriptions:  .  albuterol (PROVENTIL HFA;VENTOLIN HFA) 108 (90 BASE) MCG/ACT inhaler, Inhale 2 puffs into the lungs every 6 (six) hours as needed for wheezing or shortness of breath., Disp: 1 Inhaler, Rfl: 3 .  azelastine (ASTELIN) 0.1 % nasal spray, Place 2 sprays into both nostrils 2 (two) times daily. Use in each nostril as directed (Patient not taking: Reported on 02/08/2016), Disp: 30 mL, Rfl: 3 .  beclomethasone (QVAR) 40 MCG/ACT inhaler, Inhale 2 puffs into the lungs 2 (two) times daily at 10 AM and 5 PM. (Patient not taking: Reported on 02/08/2016), Disp: 1 Inhaler, Rfl: 3 .  conjugated estrogens (PREMARIN) vaginal cream, Use one time per weekly at bedtime-one fourth applicator vaginally, Disp: , Rfl:  .  cyclobenzaprine (FLEXERIL) 10 MG tablet, Take 10 mg by mouth., Disp: , Rfl:  .  diclofenac (VOLTAREN) 75 MG EC tablet, Take 1 tablet (75 mg total) by mouth 2 (two) times daily. (Patient not taking: Reported on 02/08/2016), Disp: 30 tablet, Rfl: 1 .  fluticasone (FLONASE) 50 MCG/ACT nasal spray, Place 2 sprays into both nostrils daily., Disp: 16 g, Rfl: 1 .  lansoprazole (PREVACID) 30 MG capsule, Take 30 mg by mouth daily at 12 noon., Disp: , Rfl:  .  methocarbamol (ROBAXIN) 500 MG tablet, Take 1 tablet (500 mg total) by mouth every 6 (six) hours as needed for muscle spasms., Disp: 20 tablet, Rfl: 0 .  mometasone (NASONEX) 50 MCG/ACT nasal  spray, USE 2 SPRAYS IN EACH NOSTRIL EVERY DAY, Disp: 17 g, Rfl: 1 .  nitroGLYCERIN (NITRODUR - DOSED IN MG/24 HR) 0.2 mg/hr patch, Apply 1/4th patch to affected area of ankle, change daily (Patient not taking: Reported on 02/08/2016), Disp: 30 patch, Rfl: 1 .  sertraline (ZOLOFT) 50 MG tablet, Take 50 mg by mouth., Disp: , Rfl:   Past Medical History: Past Medical History:  Diagnosis Date  . Allergy    SEASONAL  . Anemia   . Arthritis    "spine" (03/01/2014)  . Chronic lower back pain   . Endometriosis   . Erosive gastritis   . External hemorrhoids   . GERD (gastroesophageal reflux disease)   . H/O shortness of breath 2014   Cardiopulmonary exercise test results  . Headache    "probably monthly" (03/01/2014)  . Migraine 1998; 02/2014   "this one's lasted 10 days straight" (03/01/2014)  . OSA on CPAP   . Ovarian cyst, left   . Proctalgia fugax     Tobacco Use: History  Smoking Status  . Never Smoker  Smokeless Tobacco  . Never Used    Comment: father smoked in house growing up per pt.     Labs: Recent Review Flowsheet Data    Labs for ITP Cardiac and Pulmonary Rehab Latest Ref Rng & Units 06/19/2008 01/16/2014 03/27/2014   Cholestrol 0 - 200 mg/dL - 223(H) -   LDLCALC 0 - 99 mg/dL - 150(H) -   HDL >39.00  mg/dL - 37.50(L) -   Trlycerides 0.0 - 149.0 mg/dL - 178.0(H) -   Hemoglobin A1c 4.6 - 6.5 % - - 5.4   TCO2 0 - 100 mmol/L 23 - -      Capillary Blood Glucose: Lab Results  Component Value Date   GLUCAP 87 03/01/2015   GLUCAP 88 02/28/2015     ADL UCSD:   Pulmonary Function Assessment:     Pulmonary Function Assessment - 02/08/16 1238      Breath   Bilateral Breath Sounds Clear   Shortness of Breath Yes;Fear of Shortness of Breath;Limiting activity      Exercise Target Goals:    Exercise Program Goal: Individual exercise prescription set with THRR, safety & activity barriers. Participant demonstrates ability to understand and report RPE using BORG  scale, to self-measure pulse accurately, and to acknowledge the importance of the exercise prescription.  Exercise Prescription Goal: Starting with aerobic activity 30 plus minutes a day, 3 days per week for initial exercise prescription. Provide home exercise prescription and guidelines that participant acknowledges understanding prior to discharge.  Activity Barriers & Risk Stratification:     Activity Barriers & Cardiac Risk Stratification - 02/08/16 1236      Activity Barriers & Cardiac Risk Stratification   Activity Barriers Deconditioning;Shortness of Breath      6 Minute Walk:     6 Minute Walk    Row Name 02/14/16 0711         6 Minute Walk   Phase Initial     Distance 1533 feet     Walk Time 6 minutes     # of Rest Breaks 0     MPH 2.9     METS 3.22     RPE 11     Perceived Dyspnea  1     Symptoms No     Resting HR 84 bpm     Resting BP 106/72     Max Ex. HR 117 bpm     Max Ex. BP 116/72     2 Minute Post BP 102/76       Interval HR   Baseline HR 84     1 Minute HR 107     2 Minute HR 110     3 Minute HR 111     4 Minute HR 117     5 Minute HR 116     6 Minute HR 115     2 Minute Post HR 88     Interval Heart Rate? Yes       Interval Oxygen   Interval Oxygen? Yes     Baseline Oxygen Saturation % 97 %     Baseline Liters of Oxygen 0 L     1 Minute Oxygen Saturation % 97 %     1 Minute Liters of Oxygen 0 L     2 Minute Oxygen Saturation % 98 %     2 Minute Liters of Oxygen 0 L     3 Minute Oxygen Saturation % 98 %     3 Minute Liters of Oxygen 0 L     4 Minute Oxygen Saturation % 96 %     4 Minute Liters of Oxygen 0 L     5 Minute Oxygen Saturation % 97 %     5 Minute Liters of Oxygen 0 L     6 Minute Oxygen Saturation % 99 %     6 Minute Liters of Oxygen 0 L  2 Minute Post Oxygen Saturation % 98 %     2 Minute Post Liters of Oxygen 0 L        Initial Exercise Prescription:     Initial Exercise Prescription - 02/14/16 0700       Date of Initial Exercise RX and Referring Provider   Date 02/12/16   Referring Provider Dr. Chase Caller     Bike   Level 0.3   Minutes 17     NuStep   Level 2   Minutes 17   METs 1.5     Track   Laps 6   Minutes 17     Prescription Details   Frequency (times per week) 2   Duration Progress to 45 minutes of aerobic exercise without signs/symptoms of physical distress     Intensity   THRR 40-80% of Max Heartrate 71-142   Ratings of Perceived Exertion 11-13   Perceived Dyspnea 0-4     Progression   Progression Continue progressive overload as per policy without signs/symptoms or physical distress.     Resistance Training   Training Prescription Yes   Weight ORANGE BANDS   Reps 10-12      Perform Capillary Blood Glucose checks as needed.  Exercise Prescription Changes:     Exercise Prescription Changes    Row Name 02/19/16 1500             Response to Exercise   Blood Pressure (Admit) 116/80       Blood Pressure (Exercise) 156/82       Blood Pressure (Exit) 100/62       Heart Rate (Admit) 80 bpm       Heart Rate (Exercise) 105 bpm       Heart Rate (Exit) 98 bpm       Oxygen Saturation (Admit) 98 %       Oxygen Saturation (Exercise) 97 %       Oxygen Saturation (Exit) 98 %       Rating of Perceived Exertion (Exercise) 15       Perceived Dyspnea (Exercise) 1       Duration Progress to 45 minutes of aerobic exercise without signs/symptoms of physical distress       Intensity -  40-80% of THRR         Progression   Progression Continue to progress workloads to maintain intensity without signs/symptoms of physical distress.         Resistance Training   Training Prescription Yes       Weight orange bands       Reps 10-12  10 minutes of strength training         Bike   Level 0.5       Minutes 17         NuStep   Level 2       Minutes 17       METs 1.8         Track   Laps 18       Minutes 17          Exercise Comments:   Discharge  Exercise Prescription (Final Exercise Prescription Changes):     Exercise Prescription Changes - 02/19/16 1500      Response to Exercise   Blood Pressure (Admit) 116/80   Blood Pressure (Exercise) 156/82   Blood Pressure (Exit) 100/62   Heart Rate (Admit) 80 bpm   Heart Rate (Exercise) 105 bpm   Heart Rate (Exit) 98 bpm  Oxygen Saturation (Admit) 98 %   Oxygen Saturation (Exercise) 97 %   Oxygen Saturation (Exit) 98 %   Rating of Perceived Exertion (Exercise) 15   Perceived Dyspnea (Exercise) 1   Duration Progress to 45 minutes of aerobic exercise without signs/symptoms of physical distress   Intensity --  40-80% of THRR     Progression   Progression Continue to progress workloads to maintain intensity without signs/symptoms of physical distress.     Resistance Training   Training Prescription Yes   Weight orange bands   Reps 10-12  10 minutes of strength training     Bike   Level 0.5   Minutes 17     NuStep   Level 2   Minutes 17   METs 1.8     Track   Laps 18   Minutes 17       Nutrition:  Target Goals: Understanding of nutrition guidelines, daily intake of sodium 1500mg , cholesterol 200mg , calories 30% from fat and 7% or less from saturated fats, daily to have 5 or more servings of fruits and vegetables.  Biometrics:    Nutrition Therapy Plan and Nutrition Goals:   Nutrition Discharge: Rate Your Plate Scores:   Psychosocial: Target Goals: Acknowledge presence or absence of depression, maximize coping skills, provide positive support system. Participant is able to verbalize types and ability to use techniques and skills needed for reducing stress and depression.  Initial Review & Psychosocial Screening:     Initial Psych Review & Screening - 02/08/16 [place]? Yes     Barriers   Psychosocial barriers to participate in program There are no identifiable barriers or psychosocial needs.     Screening  Interventions   Interventions Encouraged to exercise      Quality of Life Scores:   PHQ-9: Recent Review Flowsheet Data    Depression screen [facility] [place] 2/9 02/08/2016 03/18/2013   Decreased Interest 0 0   Down, Depressed, Hopeless 0 0   PHQ - 2 Score 0 0      Psychosocial Evaluation and Intervention:     Psychosocial Evaluation - 02/08/16 1240      Psychosocial Evaluation & Interventions   Interventions Encouraged to exercise with the program and follow exercise prescription      Psychosocial Re-Evaluation:     Psychosocial Re-Evaluation    [place] Name 02/21/16 0842             Psychosocial Re-Evaluation   Interventions Encouraged to attend Pulmonary Rehabilitation for the exercise       Comments no psychosocial issues identified at this time       Continued Psychosocial Services Needed No         Education: Education Goals: Education classes will be provided on a weekly basis, covering required topics. Participant will state understanding/return demonstration of topics presented.  Learning Barriers/Preferences:   Education Topics: Risk Factor Reduction:  -Group instruction that is supported by a PowerPoint presentation. Instructor discusses the definition of a risk factor, different risk factors for pulmonary disease, and how the heart and lungs work together.     Nutrition for Pulmonary Patient:  -Group instruction provided by PowerPoint slides, verbal discussion, and written materials to support subject matter. The instructor gives an explanation and review of healthy diet recommendations, which includes a discussion on weight management, recommendations for fruit and vegetable consumption, as well as protein, fluid, caffeine, fiber, sodium, sugar, and alcohol. Tips for eating  when patients are short of breath are discussed.   Pursed Lip Breathing:  -Group instruction that is supported by demonstration and informational handouts. Instructor discusses the benefits of  pursed lip and diaphragmatic breathing and detailed demonstration on how to preform both.     Oxygen Safety:  -Group instruction provided by PowerPoint, verbal discussion, and written material to support subject matter. There is an overview of "What is Oxygen" and "Why do we need it".  Instructor also reviews how to create a safe environment for oxygen use, the importance of using oxygen as prescribed, and the risks of noncompliance. There is a brief discussion on traveling with oxygen and resources the patient may utilize.   Oxygen Equipment:  -Group instruction provided by [facility] Staff utilizing handouts, written materials, and equipment demonstrations.   Signs and Symptoms:  -Group instruction provided by written material and verbal discussion to support subject matter. Warning signs and symptoms of infection, stroke, and heart attack are reviewed and when to call the physician/911 reinforced. Tips for preventing the spread of infection discussed.   Advanced Directives:  -Group instruction provided by verbal instruction and written material to support subject matter. Instructor reviews Advanced Directive laws and proper instruction for filling out document.   Pulmonary Video:  -Group video education that reviews the importance of medication and oxygen compliance, exercise, good nutrition, pulmonary hygiene, and pursed lip and diaphragmatic breathing for the pulmonary patient.   Exercise for the Pulmonary Patient:  -Group instruction that is supported by a PowerPoint presentation. Instructor discusses benefits of exercise, core components of exercise, frequency, duration, and intensity of an exercise routine, importance of utilizing pulse oximetry during exercise, safety while exercising, and options of places to exercise outside of rehab.     Pulmonary Medications:  -Verbally interactive group education provided by instructor with focus on inhaled medications and proper  administration.   Anatomy and Physiology of the Respiratory System and Intimacy:  -Group instruction provided by PowerPoint, verbal discussion, and written material to support subject matter. Instructor reviews respiratory cycle and anatomical components of the respiratory system and their functions. Instructor also reviews differences in obstructive and restrictive respiratory diseases with examples of each. Intimacy, Sex, and Sexuality differences are reviewed with a discussion on how relationships can change when diagnosed with pulmonary disease. Common sexual concerns are reviewed.   Knowledge Questionnaire Score:   Core Components/Risk Factors/Patient Goals at Admission:     Personal Goals and Risk Factors at Admission - 02/08/16 1240      Core Components/Risk Factors/Patient Goals on Admission    Weight Management Yes;Obesity   Intervention Weight Management: Develop a combined nutrition and exercise program designed to reach desired caloric intake, while maintaining appropriate intake of nutrient and fiber, sodium and fats, and appropriate energy expenditure required for the weight goal.   Expected Outcomes Short Term: Continue to assess and modify interventions until short term weight is achieved;Long Term: Adherence to nutrition and physical activity/exercise program aimed toward attainment of established weight goal;Weight Loss: Understanding of general recommendations for a balanced deficit meal plan, which promotes 1-2 lb weight loss per week and includes a negative energy balance of [phone] kcal/d;Understanding recommendations for meals to include 15-35% energy as protein, 25-35% energy from fat, 35-60% energy from carbohydrates, less than 200mg  of dietary cholesterol, 20-35 gm of total fiber daily   Increase Strength and Stamina Yes   Intervention Provide advice, education, support and counseling about physical activity/exercise needs.;Develop an individualized exercise  prescription for aerobic and  resistive training based on initial evaluation findings, risk stratification, comorbidities and participant's personal goals.   Expected Outcomes Achievement of increased cardiorespiratory fitness and enhanced flexibility, muscular endurance and strength shown through measurements of functional capacity and personal statement of participant.   Improve shortness of breath with ADL's Yes   Intervention Provide education, individualized exercise plan and daily activity instruction to help decrease symptoms of SOB with activities of daily living.   Expected Outcomes Short Term: Achieves a reduction of symptoms when performing activities of daily living.   Develop more efficient breathing techniques such as purse lipped breathing and diaphragmatic breathing; and practicing self-pacing with activity Yes   Intervention Provide education, demonstration and support about specific breathing techniuqes utilized for more efficient breathing. Include techniques such as pursed lipped breathing, diaphragmatic breathing and self-pacing activity.   Expected Outcomes Short Term: Participant will be able to demonstrate and use breathing techniques as needed throughout daily activities.   Increase knowledge of respiratory medications and ability to use respiratory devices properly  Yes   Intervention Provide education and demonstration as needed of appropriate use of medications, inhalers, and oxygen therapy.   Expected Outcomes Short Term: Achieves understanding of medications use. Understands that oxygen is a medication prescribed by physician. Demonstrates appropriate use of inhaler and oxygen therapy.      Core Components/Risk Factors/Patient Goals Review:      Goals and Risk Factor Review    Row Name 02/21/16 0840             Core Components/Risk Factors/Patient Goals Review   Personal Goals Review Weight Management/Obesity;Increase knowledge of respiratory medications and  ability to use respiratory devices properly.;Improve shortness of breath with ADL's;Develop more efficient breathing techniques such as purse lipped breathing and diaphragmatic breathing and practicing self-pacing with activity.;Increase Strength and Stamina       Review Has only attended 1 exercise session, too early to have any improvement       Expected Outcomes Should see an improvement in goals in the next 30 days          Core Components/Risk Factors/Patient Goals at Discharge (Final Review):      Goals and Risk Factor Review - 02/21/16 0840      Core Components/Risk Factors/Patient Goals Review   Personal Goals Review Weight Management/Obesity;Increase knowledge of respiratory medications and ability to use respiratory devices properly.;Improve shortness of breath with ADL's;Develop more efficient breathing techniques such as purse lipped breathing and diaphragmatic breathing and practicing self-pacing with activity.;Increase Strength and Stamina   Review Has only attended 1 exercise session, too early to have any improvement   Expected Outcomes Should see an improvement in goals in the next 30 days      ITP Comments:   Comments: ITP REVIEW Pt is making expected progress toward pulmonary rehab goals after completing 1 sessions. Recommend continued exercise, life style modification, education, and utilization of breathing techniques to increase stamina and strength and decrease shortness of breath with exertion.

## 2016-02-21 NOTE — Progress Notes (Signed)
Daily Session Note  Patient Details  Name: Lorraine Conrad MRN: [id number] Date of Birth: [birth year] Referring Provider:   April Manson Pulmonary Rehab Walk Test from 02/12/2016 in [place]  Referring Provider  Dr. Chase Caller      Encounter Date: 02/21/2016  Check In:     Session Check In - 02/21/16 1351      Check-In   Location [facility]   Staff Present Rosebud Poles, RN, BSN;Molly diVincenzo, MS, ACSM RCEP, Exercise Physiologist;Jyren Cerasoli Ysidro Evert, RN;Portia Rollene Rotunda, RN, BSN   Supervising physician immediately available to respond to emergencies [facility] immediately available   Physician(s) Dr. Waldron Labs   Medication changes reported     No   Fall or balance concerns reported    No   Warm-up and Cool-down Performed as group-led instruction   Resistance Training Performed Yes   VAD Patient? No     Pain Assessment   Currently in Pain? No/denies   Multiple Pain Sites No      Capillary Blood Glucose: No results found for this or any previous visit (from the past 24 hour(s)).      Exercise Prescription Changes - 02/21/16 1500      Exercise Review   Progression Yes     Response to Exercise   Blood Pressure (Admit) 104/60   Blood Pressure (Exercise) 136/70   Blood Pressure (Exit) 114/74   Heart Rate (Admit) 89 bpm   Heart Rate (Exercise) 115 bpm   Heart Rate (Exit) 82 bpm   Oxygen Saturation (Admit) 97 %   Oxygen Saturation (Exercise) 97 %   Oxygen Saturation (Exit) 97 %   Rating of Perceived Exertion (Exercise) 13   Perceived Dyspnea (Exercise) 1   Duration Progress to 45 minutes of aerobic exercise without signs/symptoms of physical distress   Intensity THRR unchanged     Progression   Progression Continue to progress workloads to maintain intensity without signs/symptoms of physical distress.     Resistance Training   Training Prescription Yes   Weight orange bands   Reps 10-12  10 minutes of strength  training     Treadmill   MPH 2.3   Grade 1   Minutes 17     Bike   Level 0.7   Minutes 17     Goals Met:  Exercise tolerated well No report of cardiac concerns or symptoms Strength training completed today  Goals Unmet:  Not Applicable  Comments: Service time is from 1330 to 1500    Dr. Rush Farmer is Medical Director for Pulmonary Rehab at [facility].

## 2016-02-26 ENCOUNTER — Encounter (HOSPITAL_COMMUNITY): Admission: RE | Admit: 2016-02-26 | Payer: BLUE CROSS/BLUE SHIELD | Source: Ambulatory Visit

## 2016-02-26 ENCOUNTER — Telehealth (HOSPITAL_COMMUNITY): Payer: Self-pay | Admitting: *Deleted

## 2016-02-26 DIAGNOSIS — J00 Acute nasopharyngitis [common cold]: Secondary | ICD-10-CM | POA: Diagnosis not present

## 2016-02-26 DIAGNOSIS — H66002 Acute suppurative otitis media without spontaneous rupture of ear drum, left ear: Secondary | ICD-10-CM | POA: Diagnosis not present

## 2016-02-26 DIAGNOSIS — J029 Acute pharyngitis, unspecified: Secondary | ICD-10-CM | POA: Diagnosis not present

## 2016-02-28 ENCOUNTER — Encounter (HOSPITAL_COMMUNITY): Payer: BLUE CROSS/BLUE SHIELD

## 2016-03-04 ENCOUNTER — Encounter (HOSPITAL_COMMUNITY)
Admission: RE | Admit: 2016-03-04 | Discharge: 2016-03-04 | Disposition: A | Discharge status: Home / Self Care | Payer: BLUE CROSS/BLUE SHIELD | Source: Ambulatory Visit | Attending: Internal Medicine | Admitting: Internal Medicine

## 2016-03-04 ENCOUNTER — Ambulatory Visit: Payer: BLUE CROSS/BLUE SHIELD

## 2016-03-04 VITALS — Wt 214.1 lb

## 2016-03-04 DIAGNOSIS — L603 Nail dystrophy: Secondary | ICD-10-CM

## 2016-03-04 DIAGNOSIS — R0600 Dyspnea, unspecified: Secondary | ICD-10-CM | POA: Diagnosis not present

## 2016-03-04 NOTE — Progress Notes (Signed)
Daily Session Note  Patient Details  Name: Lorraine Conrad MRN: [id number] Date of Birth: 11/17/1970 Referring Provider:   Flowsheet Row Pulmonary Rehab Walk Test from 02/12/2016 in [facility]  Referring Provider  Dr. RAMASWAMY      Encounter Date: 03/04/2016  Check In:     Session Check In - 03/04/16 1326      Check-In   Location [facility]   Staff Present Joan Behrens, RN, BSN;Molly diVincenzo, MS, ACSM RCEP, Exercise Physiologist;Lisa Hughes, RN;Portia Payne, RN, BSN   Supervising physician immediately available to respond to emergencies [facility] immediately available   Physician(s) Dr. Madera   Medication changes reported     No   Fall or balance concerns reported    No   Warm-up and Cool-down Performed as group-led instruction   Resistance Training Performed Yes   VAD Patient? No     Pain Assessment   Currently in Pain? No/denies   Multiple Pain Sites No      Capillary Blood Glucose: No results found for this or any previous visit (from the past 24 hour(s)).      Exercise Prescription Changes - 03/04/16 1500      Exercise Review   Progression Yes     Response to Exercise   Blood Pressure (Admit) 118/80   Blood Pressure (Exercise) 144/80   Blood Pressure (Exit) 104/72   Heart Rate (Admit) 91 bpm   Heart Rate (Exercise) 135 bpm   Heart Rate (Exit) 102 bpm   Oxygen Saturation (Admit) 98 %   Oxygen Saturation (Exercise) 96 %   Oxygen Saturation (Exit) 97 %   Rating of Perceived Exertion (Exercise) 13   Perceived Dyspnea (Exercise) 1   Duration Progress to 45 minutes of aerobic exercise without signs/symptoms of physical distress   Intensity THRR unchanged     Progression   Progression Continue to progress workloads to maintain intensity without signs/symptoms of physical distress.     Resistance Training   Training Prescription Yes   Weight orange bands   Reps 10-12  10 minutes of strength training      Treadmill   MPH 2.9   Grade 3   Minutes 17     Bike   Level 0.7   Minutes 17     [place]   Level 2   Minutes 17     Goals Met:  Exercise tolerated well No report of cardiac concerns or symptoms Strength training completed today  Goals Unmet:  Not Applicable  Comments: Service time is from 1330 to 1500     Dr. Wesam G. Yacoub is Medical Director for Pulmonary Rehab at  Hospital. 

## 2016-03-04 NOTE — Progress Notes (Signed)
She presents today for Laser therapy   Objective: Onychomycosis.  Assessment: Onychomycosis 5th nails bilateral  Plan: Laser therapy administered to bilateral 5th nails and other nails as prophylactic measurement. All safety y precautions were in place. Pt tolerated procedure well. Re-appointed to follow up in 1 month for 2nd Laser treatment. Treatment was started over due to delay in therapy, due to laser repairs. 2 of 6 laser treatments done so far.

## 2016-03-05 ENCOUNTER — Ambulatory Visit: Payer: BLUE CROSS/BLUE SHIELD | Admitting: Medical

## 2016-03-06 ENCOUNTER — Encounter (HOSPITAL_COMMUNITY)
Admission: RE | Admit: 2016-03-06 | Discharge: 2016-03-06 | Disposition: A | Discharge status: Home / Self Care | Payer: BLUE CROSS/BLUE SHIELD | Source: Ambulatory Visit | Attending: Internal Medicine | Admitting: Internal Medicine

## 2016-03-06 VITALS — Wt 213.4 lb

## 2016-03-06 DIAGNOSIS — R0600 Dyspnea, unspecified: Secondary | ICD-10-CM

## 2016-03-06 NOTE — Progress Notes (Signed)
Daily Session Note  Patient Details  Name: Lorraine Conrad MRN: [id number] Date of Birth: 1970-07-30 Referring Provider:   April Manson [facility] Rehab Walk Test from 02/12/2016 in [place]  Referring Provider  Dr. Chase Caller      Encounter Date: 03/06/2016  Check In:     Session Check In - 03/06/16 1342      Check-In   Location [facility]   Staff Present Rosebud Poles, RN, BSN;Molly diVincenzo, MS, ACSM RCEP, Exercise Physiologist;Camren Lipsett Ysidro Evert, RN;Portia Rollene Rotunda, RN, BSN   Supervising physician immediately available to respond to emergencies [facility] immediately available   Physician(s) Dr. Cathlean Sauer   Medication changes reported     No   Fall or balance concerns reported    No   Warm-up and Cool-down Performed as group-led instruction   Resistance Training Performed Yes   VAD Patient? No     Pain Assessment   Currently in Pain? No/denies   Multiple Pain Sites No      Capillary Blood Glucose: No results found for this or any previous visit (from the past 24 hour(s)).      Exercise Prescription Changes - 03/06/16 1500      Exercise Review   Progression Yes     Response to Exercise   Blood Pressure (Admit) 108/58   Blood Pressure (Exercise) 130/70   Blood Pressure (Exit) 118/62   Heart Rate (Admit) 99 bpm   Heart Rate (Exercise) 137 bpm   Heart Rate (Exit) 108 bpm   Oxygen Saturation (Admit) 99 %   Oxygen Saturation (Exercise) 96 %   Oxygen Saturation (Exit) 98 %   Rating of Perceived Exertion (Exercise) 13   Perceived Dyspnea (Exercise) 1   Duration Progress to 45 minutes of aerobic exercise without signs/symptoms of physical distress   Intensity THRR unchanged     Progression   Progression Continue to progress workloads to maintain intensity without signs/symptoms of physical distress.     Resistance Training   Training Prescription Yes   Weight orange bands   Reps 10-12  10 minutes of strength training      Treadmill   MPH 3.1   Grade 6   Minutes 17     Rower   Level 2   Minutes 17     Goals Met:  Exercise tolerated well No report of cardiac concerns or symptoms Strength training completed today  Goals Unmet:  Not Applicable  Comments: Service time is from 1330 to 1515    Dr. Rush Farmer is Medical Director for Pulmonary Rehab at [facility].

## 2016-03-11 ENCOUNTER — Encounter (HOSPITAL_COMMUNITY): Payer: BLUE CROSS/BLUE SHIELD

## 2016-03-12 ENCOUNTER — Ambulatory Visit: Payer: BLUE CROSS/BLUE SHIELD | Admitting: Medical

## 2016-03-12 ENCOUNTER — Encounter: Payer: Self-pay | Admitting: Medical

## 2016-03-12 ENCOUNTER — Ambulatory Visit (INDEPENDENT_AMBULATORY_CARE_PROVIDER_SITE_OTHER): Payer: BLUE CROSS/BLUE SHIELD | Admitting: Medical

## 2016-03-12 ENCOUNTER — Telehealth: Payer: Self-pay | Admitting: Medical

## 2016-03-12 VITALS — HR 100 | Temp 98.3°F | Ht 64.0 in | Wt 208.6 lb

## 2016-03-12 DIAGNOSIS — M791 Myalgia, unspecified site: Secondary | ICD-10-CM

## 2016-03-12 DIAGNOSIS — G629 Polyneuropathy, unspecified: Secondary | ICD-10-CM

## 2016-03-12 DIAGNOSIS — R51 Headache: Secondary | ICD-10-CM | POA: Diagnosis not present

## 2016-03-12 DIAGNOSIS — L509 Urticaria, unspecified: Secondary | ICD-10-CM

## 2016-03-12 DIAGNOSIS — R29898 Other symptoms and signs involving the musculoskeletal system: Secondary | ICD-10-CM

## 2016-03-12 DIAGNOSIS — J029 Acute pharyngitis, unspecified: Secondary | ICD-10-CM

## 2016-03-12 DIAGNOSIS — L989 Disorder of the skin and subcutaneous tissue, unspecified: Secondary | ICD-10-CM

## 2016-03-12 DIAGNOSIS — R519 Headache, unspecified: Secondary | ICD-10-CM

## 2016-03-12 LAB — CBC WITH DIFFERENTIAL/PLATELET
BASOS ABS: 0 10*3/uL (ref 0.0–0.1)
BASOS PCT: 0.3 % (ref 0.0–3.0)
EOS ABS: 0.2 10*3/uL (ref 0.0–0.7)
Eosinophils Relative: 2.2 % (ref 0.0–5.0)
HEMATOCRIT: 43.2 % (ref 36.0–46.0)
Hemoglobin: 14.7 g/dL (ref 12.0–15.0)
LYMPHS ABS: 2 10*3/uL (ref 0.7–4.0)
LYMPHS PCT: 18 % (ref 12.0–46.0)
MCHC: 34 g/dL (ref 30.0–36.0)
MCV: 82.6 fl (ref 78.0–100.0)
Monocytes Absolute: 0.5 10*3/uL (ref 0.1–1.0)
Monocytes Relative: 4.8 % (ref 3.0–12.0)
NEUTROS ABS: 8.1 10*3/uL — AB (ref 1.4–7.7)
NEUTROS PCT: 74.7 % (ref 43.0–77.0)
PLATELETS: 457 10*3/uL — AB (ref 150.0–400.0)
RBC: 5.23 Mil/uL — ABNORMAL HIGH (ref 3.87–5.11)
RDW: 14.1 % (ref 11.5–15.5)
WBC: 10.8 10*3/uL — ABNORMAL HIGH (ref 4.0–10.5)

## 2016-03-12 LAB — POCT RAPID INFLUENZA A&B

## 2016-03-12 LAB — SEDIMENTATION RATE: Sed Rate: 26 mm/hr — ABNORMAL HIGH (ref 0–20)

## 2016-03-12 LAB — POCT RAPID STREP A (OFFICE): RAPID STREP A SCREEN: NEGATIVE

## 2016-03-12 MED ORDER — FLUTICASONE PROPIONATE 50 MCG/ACT NA SUSP: 2.0000 | Freq: Every day | NASAL | 1 refills | Status: DC

## 2016-03-12 MED ORDER — MUPIROCIN 2 % EX OINT: 1.0000 "application " | TOPICAL_OINTMENT | Freq: Two times a day (BID) | CUTANEOUS | 0 refills | Status: DC

## 2016-03-12 MED ORDER — HYDROXYZINE HCL 10 MG PO TABS: 10.0000 mg | ORAL_TABLET | Freq: Three times a day (TID) | ORAL | 0 refills | Status: DC | PRN

## 2016-03-12 MED ORDER — AZELASTINE HCL 0.1 % NA SOLN: 2.0000 | Freq: Two times a day (BID) | NASAL | 3 refills | Status: DC

## 2016-03-12 MED ORDER — PREDNISONE 20 MG PO TABS: ORAL_TABLET | ORAL | 0 refills | Status: DC

## 2016-03-12 NOTE — Progress Notes (Signed)
Pre visit review using our clinic tool,if applicable. No additional management support is needed unless otherwise documented below in the visit note.  

## 2016-03-12 NOTE — Telephone Encounter (Addendum)
I talked with pt and her left temporal area did hurt little worse than before. Worse than in the office. Her vision had been blurred transiently when she got home. But not now and ha subsided again. When she got home her face felt burned sensation. That was worse earlier but faint presently now during phone conversation. No gross motor or sensory function deficits on review.  I advised pt that if her symptoms worsen the go to ED main hospital. She could CT and mri if indicated. Also based on her prior neurologic history/symptoms they could get a neurology consult.  If she improves then just keep neurologist appointment on friday. Will send in prednisone rx and also get referral staff to notify neurologist of her recent temporal ha and slight sed rate elevation.

## 2016-03-12 NOTE — Telephone Encounter (Signed)
She did not have severe pain when I saw her and she had good neurologic exam. How does she feel now. And where is her pain.Is it left temporal area. Let me know. Will you call pt at [phone].

## 2016-03-12 NOTE — Telephone Encounter (Signed)
Patient was seen in the office today and called back to ask about a severe pain that she had in her head when she woke up yesterday morning. She wanted to make sure she didn't need to worry about an aneurism. Please advise.   Patient Relation: Self  Patient phone: [phone]

## 2016-03-12 NOTE — Progress Notes (Signed)
Subjective:    Patient ID: Lorraine Conrad, female    DOB: 1970/07/09, 45 y.o.   MRN: [id number]  HPI   Pt in states woke up yesterday morning with some rt side of her neck was hurting. She pointed to her rt trapezius area. Pain is hurting minimally now. But since yesterday she reports some mild diffuse neck pain. She mentioned also some left temporal area. Pt has some st on swallowing. Pt still has left temporal area pain in past before. She has had some sed rate elevation in past. Pt was referred to ENT. She never had temporal artery biopsy. Pt states in past after left temporal area pain her gyn stopped progesterone and she notes no recurrence of left temporal area pain since.(except until recently). Faint temporal pain left side now. Was worse yesterday. No gross motor or sensory function deficits.  Pt also states also around sept 19, 2017. Had some hoarse voice and nasal congestion. [facility] She [facility]in. Then she thought she got yeast infection. Her gyn called in diflucan. Pt stopped augmentin.  Pt also reports some burning on her feet front aspect. Was worse last 2 days. No back pain reported. No radicular pain. Other day doing pulmonary rehab and her legs felt very heavy and extremely weak on both sides. Pt in past for extremity and leg weakness went to neurologist. Pt has had work up in past and evaluated for cause of extremity weakness. No cause found. But one neurolologist per pt considered MS. Other specialist doubted. Other considered in past Gu[place] [facility]re.  Pt mentioned rt distal forearm. Small red bump. About one week ago was itching. Faint bruise. But now bruise is resolved. Itching is resolved.  Now has faint diffuse itching all over. No sob or wheezing. No rash noted.   Pt had remote history of meningitis about 2 years ago. No stiff neck today.  Review of Systems  Constitutional: Negative for chills, fatigue and fever.  HENT: Positive for ear pain,  sinus pressure and sore throat. Negative for congestion, dental problem, tinnitus, trouble swallowing and voice change.   Eyes: Negative for pain.  Respiratory: Negative for cough, chest tightness, shortness of breath and wheezing.   Cardiovascular: Negative for chest pain and palpitations.  Gastrointestinal: Negative for abdominal pain.  Genitourinary: Negative for difficulty urinating, flank pain, frequency, hematuria, pelvic pain and urgency.  Musculoskeletal: Positive for myalgias. Negative for arthralgias and back pain.       See hpi.  Skin: Negative for rash.       Skin itching.  Neurological: Negative for dizziness and headaches.       Other day leg weakness. Post rehab. Was quite severe see hpi.  Hematological: Negative for adenopathy. Does not bruise/bleed easily.  Psychiatric/Behavioral: Negative for behavioral problems and confusion.    Past Medical History:  Diagnosis Date  . Allergy    SEASONAL  . Anemia   . Arthritis    "spine" (03/01/2014)  . Chronic lower back pain   . Endometriosis   . Erosive gastritis   . External hemorrhoids   . GERD (gastroesophageal reflux disease)   . H/O shortness of breath 2014   Cardiopulmonary exercise test results  . Headache    "probably monthly" (03/01/2014)  . Migraine 1998; 02/2014   "this one's lasted 10 days straight" (03/01/2014)  . OSA on CPAP   . Ovarian cyst, left   . Proctalgia fugax      Social History  Social History  . Marital status: Married    Spouse name: N/A  . Number of children: N/A  . Years of education: N/A   Occupational History  . Not on file.   Social History Main Topics  . Smoking status: Never Smoker  . Smokeless tobacco: Never Used     Comment: father smoked in house growing up per pt.   . Alcohol use 0.0 oz/week     Comment: 03/01/2014 "I'll have 1-2 drinks maybe 3-4 times/yr"  . Drug use: No  . Sexual activity: Yes    Birth control/ protection: Surgical   Other Topics Concern  . Not on  file   Social History Narrative   Daily caffiene use: 6 cups per day   Patient does not get regular excercise    Past Surgical History:  Procedure Laterality Date  . ABDOMINAL HYSTERECTOMY  04/2013  . BREAST BIOPSY Bilateral   . BREAST LUMPECTOMY Left ~ 1993  . CESAREAN SECTION  2002; 2006  . COLONOSCOPY    . LAPAROSCOPIC OVARIAN CYSTECTOMY  ~ 1999  . NASAL SEPTUM SURGERY  ~ 1991  . TEMPOROMANDIBULAR JOINT SURGERY  ~ 30  . UPPER GASTROINTESTINAL ENDOSCOPY      Family History  Problem Relation Age of Onset  . Breast cancer Maternal Grandmother   . Prostate cancer Paternal Grandfather   . Colon polyps Father   . Heart disease Father   . Colon cancer Neg Hx     Allergies  Allergen Reactions  . Promethazine Hcl Other (See Comments)    Violent tremors   . Cephalexin Diarrhea and Itching  . Codeine Other (See Comments)    Was a child when she took this.  . Dilaudid [Hydromorphone] Nausea And Vomiting  . Azithromycin Rash    Head to toe rash    Current Outpatient Prescriptions on File Prior to Visit  Medication Sig Dispense Refill  . albuterol (PROVENTIL HFA;VENTOLIN HFA) 108 (90 BASE) MCG/ACT inhaler Inhale 2 puffs into the lungs every 6 (six) hours as needed for wheezing or shortness of breath. 1 Inhaler 3  . azelastine (ASTELIN) 0.1 % nasal spray Place 2 sprays into both nostrils 2 (two) times daily. Use in each nostril as directed 30 mL 3  . beclomethasone (QVAR) 40 MCG/ACT inhaler Inhale 2 puffs into the lungs 2 (two) times daily at 10 AM and 5 PM. 1 Inhaler 3  . conjugated estrogens (PREMARIN) vaginal cream Use one time per weekly at bedtime-one fourth applicator vaginally    . cyclobenzaprine (FLEXERIL) 10 MG tablet Take 10 mg by mouth.    . lansoprazole (PREVACID) 30 MG capsule Take 30 mg by mouth daily at 12 noon.    . methocarbamol (ROBAXIN) 500 MG tablet Take 1 tablet (500 mg total) by mouth every 6 (six) hours as needed for muscle spasms. 20 tablet 0  .  mometasone (NASONEX) 50 MCG/ACT nasal spray USE 2 SPRAYS IN EACH NOSTRIL EVERY DAY 17 g 1  . diclofenac (VOLTAREN) 75 MG EC tablet Take 1 tablet (75 mg total) by mouth 2 (two) times daily. (Patient not taking: Reported on 03/12/2016) 30 tablet 1  . fluticasone (FLONASE) 50 MCG/ACT nasal spray Place 2 sprays into both nostrils daily. (Patient not taking: Reported on 03/12/2016) 16 g 1  . nitroGLYCERIN (NITRODUR - DOSED IN MG/24 HR) 0.2 mg/hr patch Apply 1/4th patch to affected area of ankle, change daily (Patient not taking: Reported on 03/12/2016) 30 patch 1  . sertraline (ZOLOFT) 50 MG  tablet Take 50 mg by mouth.     No current facility-administered medications on file prior to visit.     Pulse 100   Temp 98.3 F (36.8 C) (Oral)   Ht 5\' 4"  (1.626 m)   Wt 208 lb 9.6 oz (94.6 kg)   LMP 02/08/2013   SpO2 100%   BMI 35.81 kg/m       Objective:   Physical Exam  General  Mental Status - Alert. General Appearance - Well groomed. Not in acute distress.  Skin Rashes- No Rashes. No left side temporal rash or dilated veins. Small papule rt forearm.   HEENT Head- Normal. Ear Auditory Canal - Left- Normal. Right - Normal.Tympanic Membrane- Left- Normal. Right- Normal. Eye Sclera/Conjunctiva- Left- Normal. Right- Normal. Nose & Sinuses Nasal Mucosa- Left-  Boggy and Congested. Right-  Boggy and  Congested.Bilateral no maxillary pressure but  Faint left  frontal sinus pressure. Mouth & Throat Lips: Upper Lip- Normal: no dryness, cracking, pallor, cyanosis, or vesicular eruption. Lower Lip-Normal: no dryness, cracking, pallor, cyanosis or vesicular eruption. Buccal Mucosa- Bilateral- No Aphthous ulcers. Oropharynx- No Discharge or Erythema. Tonsils: Characteristics- Bilateral- moderate  Erythema but no Congestion. Size/Enlargement- Bilateral- No enlargement. Discharge- bilateral-None.  Neck Neck- Supple. No Masses. From. No rigidity.   Chest and Lung Exam Auscultation: Breath  Sounds:-Clear even and unlabored.  Cardiovascular Auscultation:Rythm- Regular, rate and rhythm. Murmurs & Other Heart Sounds:Ausculatation of the heart reveal- No Murmurs.  Lymphatic Head & Neck General Head & Neck Lymphatics: Bilateral: Description- No Localized lymphadenopathy.   Neurologic Cranial Nerve exam:- CN III-XII intact(No nystagmus), symmetric smile. Drift Test:- No drift. Romberg Exam:- Negative.  Finger to Nose:- Normal/Intact Strength:- 5/5 equal and symmetric strength both upper and lower extremities. Sharp and dull discrimination intact lower ext.     Assessment & Plan:  Your rapid strep and rapid flu test is negative. We will send out throat culture as well.  For your history of ha and sed rate elevation. Will recheck sed rate today. If elevated will rx prednisone and refer you to specialist.  For lower ext weakness history will refer you back to neurologist. If any acute severe episodes before then be seen in ED.  For small skin lesion/papule. If the area gets red or tenderness can use mupirocin. Bunnie Pion does not have worrisome features if changes let us know.  For skin itching moisturize skin well and will rx low dose hydroxyzine.  For faint ha will follow sed rate and send out throat culture. Can use low dose ibuprofen.  For sinus pressure and ear pain will rx flonase nasal spray.Your rapid strep and rapid flu test is negative. We will send out throat culture as well.  For your history of ha and sed rate elevation. Will recheck sed rate today. If elevated will rx prednisone and refer you to specialist.  For lower ext weakness history will refer you back to neurologist. If any acute severe episodes before then be seen in ED.  For small skin lesion/papule. If the area gets red or tender can use mupirocin. Bunnie Pion does not have worrisome features if changes let us know.  For skin itching moisturize skin well and will rx low dose hydroxyzine.  For faint  ha will follow sed rate and send out throat culture. Can use low dose ibuprofen.  For sinus pressure and ear pain will rx flonase nasal spray.  Follow up in 7-10 days or as needed

## 2016-03-12 NOTE — Telephone Encounter (Signed)
Patient states her pain in her head at left temporal region still hurts. States she has burning sensation in face and neck. Says if you are going to prescribe Voltaren, she will need a new Rx called in because she is out of it.

## 2016-03-12 NOTE — Patient Instructions (Addendum)
  Your rapid strep and rapid flu test is negative. We will send out throat culture as well.  For your history of ha and sed rate elevation. Will recheck sed rate today. If elevated will rx prednisone and refer you to specialist.  For lower ext weakness history will refer you back to neurologist. If any acute severe episodes before then be seen in ED.  For small skin lesion/papule. If the area gets red or tender can use mupirocin. Lorraine Conrad does not have worrisome features if changes let us know.  For skin itching moisturize skin well and will rx low dose hydroxyzine.  For faint ha will follow sed rate and send out throat culture. Can use low dose ibuprofen.  For sinus pressure and ear pain will rx flonase nasal spray.(make astelin available also)  Follow up in 7-10 days or as needed

## 2016-03-12 NOTE — Telephone Encounter (Signed)
Will you notify neurologist also that she has had recent left temporal ha. Mild elevated sed rate. I put her on prednisone. Would like there opinion on this as well. Do they do temporal artery biopsy?

## 2016-03-13 ENCOUNTER — Encounter (HOSPITAL_COMMUNITY): Payer: BLUE CROSS/BLUE SHIELD

## 2016-03-13 LAB — CULTURE, GROUP A STREP: ORGANISM ID, BACTERIA: NORMAL

## 2016-03-13 NOTE — Telephone Encounter (Signed)
Per Hinton Dyer at [facility] they do not do any biopsies in the office.

## 2016-03-14 ENCOUNTER — Telehealth (HOSPITAL_COMMUNITY): Payer: Self-pay | Admitting: *Deleted

## 2016-03-14 ENCOUNTER — Ambulatory Visit: Payer: BLUE CROSS/BLUE SHIELD | Admitting: Neurology

## 2016-03-14 DIAGNOSIS — R700 Elevated erythrocyte sedimentation rate: Secondary | ICD-10-CM

## 2016-03-14 DIAGNOSIS — R519 Headache, unspecified: Secondary | ICD-10-CM

## 2016-03-14 DIAGNOSIS — R51 Headache: Secondary | ICD-10-CM

## 2016-03-14 NOTE — Telephone Encounter (Signed)
Called [facility] and verified they can do temporal artery biopsy in office, faxed referral, waiting for appt

## 2016-03-14 NOTE — Telephone Encounter (Signed)
Please seen ent referral

## 2016-03-18 ENCOUNTER — Encounter (HOSPITAL_COMMUNITY)
Admission: RE | Admit: 2016-03-18 | Discharge: 2016-03-18 | Disposition: A | Discharge status: Home / Self Care | Payer: BLUE CROSS/BLUE SHIELD | Source: Ambulatory Visit | Attending: Internal Medicine | Admitting: Internal Medicine

## 2016-03-18 ENCOUNTER — Telehealth: Payer: Self-pay | Admitting: Medical

## 2016-03-18 DIAGNOSIS — R0600 Dyspnea, unspecified: Secondary | ICD-10-CM | POA: Insufficient documentation

## 2016-03-18 NOTE — Progress Notes (Signed)
Pulmonary Individual Treatment Plan  Patient Details  Name: Jeiry Jeanphilippe MRN: [id number] Date of Birth: [birth year] Referring Provider:   April Manson Pulmonary Rehab Walk Test from 02/12/2016 in [place]  Referring Provider  Dr. Chase Caller      Initial Encounter Date:  Flowsheet Row Pulmonary Rehab Walk Test from 02/12/2016 in [place]  Date  02/12/16  Referring Provider  Dr. Chase Caller      Visit Diagnosis: Dyspnea, unspecified type  Patient's Home Medications on Admission:   Current Outpatient Prescriptions:  .  albuterol (PROVENTIL HFA;VENTOLIN HFA) 108 (90 BASE) MCG/ACT inhaler, Inhale 2 puffs into the lungs every 6 (six) hours as needed for wheezing or shortness of breath., Disp: 1 Inhaler, Rfl: 3 .  amoxicillin-clavulanate (AUGMENTIN) 875-125 MG tablet, Take 1 tablet by mouth 2 (two) times daily. Take 1 tablet by mouth for 10 days, Disp: , Rfl:  .  azelastine (ASTELIN) 0.1 % nasal spray, Place 2 sprays into both nostrils 2 (two) times daily. Use in each nostril as directed, Disp: 30 mL, Rfl: 3 .  beclomethasone (QVAR) 40 MCG/ACT inhaler, Inhale 2 puffs into the lungs 2 (two) times daily at 10 AM and 5 PM., Disp: 1 Inhaler, Rfl: 3 .  conjugated estrogens (PREMARIN) vaginal cream, Use one time per weekly at bedtime-one fourth applicator vaginally, Disp: , Rfl:  .  cyclobenzaprine (FLEXERIL) 10 MG tablet, Take 10 mg by mouth., Disp: , Rfl:  .  diclofenac (VOLTAREN) 75 MG EC tablet, Take 1 tablet (75 mg total) by mouth 2 (two) times daily. (Patient not taking: Reported on 03/12/2016), Disp: 30 tablet, Rfl: 1 .  fluconazole (DIFLUCAN) 150 MG tablet, Take 150 mg by mouth every other day., Disp: , Rfl:  .  fluticasone (FLONASE) 50 MCG/ACT nasal spray, Place 2 sprays into both nostrils daily., Disp: 16 g, Rfl: 1 .  guaiFENesin (MUCINEX) 600 MG 12 hr tablet, Take 1,200 mg by mouth 2 (two) times daily., Disp: , Rfl:  .   hydrOXYzine (ATARAX/VISTARIL) 10 MG tablet, Take 1 tablet (10 mg total) by mouth 3 (three) times daily as needed for itching., Disp: 30 tablet, Rfl: 0 .  lansoprazole (PREVACID) 30 MG capsule, Take 30 mg by mouth daily at 12 noon., Disp: , Rfl:  .  methocarbamol (ROBAXIN) 500 MG tablet, Take 1 tablet (500 mg total) by mouth every 6 (six) hours as needed for muscle spasms., Disp: 20 tablet, Rfl: 0 .  mometasone (NASONEX) 50 MCG/ACT nasal spray, USE 2 SPRAYS IN EACH NOSTRIL EVERY DAY, Disp: 17 g, Rfl: 1 .  mupirocin ointment (BACTROBAN) 2 %, Place 1 application into the nose 2 (two) times daily., Disp: 22 g, Rfl: 0 .  nitroGLYCERIN (NITRODUR - DOSED IN MG/24 HR) 0.2 mg/hr patch, Apply 1/4th patch to affected area of ankle, change daily (Patient not taking: Reported on 03/12/2016), Disp: 30 patch, Rfl: 1 .  predniSONE (DELTASONE) 20 MG tablet, 1 tab po tid x 5 days, Disp: 15 tablet, Rfl: 0 .  Probiotic Product (PROBIOTIC-10) CAPS, Take by mouth., Disp: , Rfl:  .  sertraline (ZOLOFT) 50 MG tablet, Take 50 mg by mouth., Disp: , Rfl:   Past Medical History: Past Medical History:  Diagnosis Date  . Allergy    SEASONAL  . Anemia   . Arthritis    "spine" (03/01/2014)  . Chronic lower back pain   . Endometriosis   . Erosive gastritis   . External hemorrhoids   .  GERD (gastroesophageal reflux disease)   . H/O shortness of breath 2014   Cardiopulmonary exercise test results  . Headache    "probably monthly" (03/01/2014)  . Migraine 1998; 02/2014   "this one's lasted 10 days straight" (03/01/2014)  . OSA on CPAP   . Ovarian cyst, left   . Proctalgia fugax     Tobacco Use: History  Smoking Status  . Never Smoker  Smokeless Tobacco  . Never Used    Comment: father smoked in house growing up per pt.     Labs: Recent Review Flowsheet Data    Labs for ITP Cardiac and Pulmonary Rehab Latest Ref Rng & Units 06/19/2008 01/16/2014 03/27/2014   Cholestrol 0 - 200 mg/dL - 223(H) -   LDLCALC 0 - 99  mg/dL - 150(H) -   HDL >39.00 mg/dL - 37.50(L) -   Trlycerides 0.0 - 149.0 mg/dL - 178.0(H) -   Hemoglobin A1c 4.6 - 6.5 % - - 5.4   TCO2 0 - 100 mmol/L 23 - -      Capillary Blood Glucose: Lab Results  Component Value Date   GLUCAP 87 03/01/2015   GLUCAP 88 02/28/2015     ADL UCSD:   Pulmonary Function Assessment:     Pulmonary Function Assessment - 02/08/16 1238      Breath   Bilateral Breath Sounds Clear   Shortness of Breath Yes;Fear of Shortness of Breath;Limiting activity      Exercise Target Goals:    Exercise Program Goal: Individual exercise prescription set with THRR, safety & activity barriers. Participant demonstrates ability to understand and report RPE using BORG scale, to self-measure pulse accurately, and to acknowledge the importance of the exercise prescription.  Exercise Prescription Goal: Starting with aerobic activity 30 plus minutes a day, 3 days per week for initial exercise prescription. Provide home exercise prescription and guidelines that participant acknowledges understanding prior to discharge.  Activity Barriers & Risk Stratification:     Activity Barriers & Cardiac Risk Stratification - 02/08/16 1236      Activity Barriers & Cardiac Risk Stratification   Activity Barriers Deconditioning;Shortness of Breath      6 Minute Walk:     6 Minute Walk    Row Name 02/14/16 0711         6 Minute Walk   Phase Initial     Distance 1533 feet     Walk Time 6 minutes     # of Rest Breaks 0     MPH 2.9     METS 3.22     RPE 11     Perceived Dyspnea  1     Symptoms No     Resting HR 84 bpm     Resting BP 106/72     Max Ex. HR 117 bpm     Max Ex. BP 116/72     2 Minute Post BP 102/76       Interval HR   Baseline HR 84     1 Minute HR 107     2 Minute HR 110     3 Minute HR 111     4 Minute HR 117     5 Minute HR 116     6 Minute HR 115     2 Minute Post HR 88     Interval Heart Rate? Yes       Interval Oxygen   Interval  Oxygen? Yes     Baseline Oxygen Saturation % 97 %  Baseline Liters of Oxygen 0 L     1 Minute Oxygen Saturation % 97 %     1 Minute Liters of Oxygen 0 L     2 Minute Oxygen Saturation % 98 %     2 Minute Liters of Oxygen 0 L     3 Minute Oxygen Saturation % 98 %     3 Minute Liters of Oxygen 0 L     4 Minute Oxygen Saturation % 96 %     4 Minute Liters of Oxygen 0 L     5 Minute Oxygen Saturation % 97 %     5 Minute Liters of Oxygen 0 L     6 Minute Oxygen Saturation % 99 %     6 Minute Liters of Oxygen 0 L     2 Minute Post Oxygen Saturation % 98 %     2 Minute Post Liters of Oxygen 0 L        Initial Exercise Prescription:     Initial Exercise Prescription - 02/14/16 0700      Date of Initial Exercise RX and Referring Provider   Date 02/12/16   Referring Provider Dr. Chase Caller     Bike   Level 0.3   Minutes 17     NuStep   Level 2   Minutes 17   METs 1.5     Track   Laps 6   Minutes 17     Prescription Details   Frequency (times per week) 2   Duration Progress to 45 minutes of aerobic exercise without signs/symptoms of physical distress     Intensity   THRR 40-80% of Max Heartrate 71-142   Ratings of Perceived Exertion 11-13   Perceived Dyspnea 0-4     Progression   Progression Continue progressive overload as per policy without signs/symptoms or physical distress.     Resistance Training   Training Prescription Yes   Weight ORANGE BANDS   Reps 10-12      Perform Capillary Blood Glucose checks as needed.  Exercise Prescription Changes:     Exercise Prescription Changes    Row Name 02/19/16 1500 02/21/16 1500 03/04/16 1500 03/06/16 1500       Exercise Review   Progression  - Yes Yes Yes      Response to Exercise   Blood Pressure (Admit) 116/80 104/60 118/80 108/58    Blood Pressure (Exercise) 156/82 136/70 144/80 130/70    Blood Pressure (Exit) 100/62 114/74 104/72 118/62    Heart Rate (Admit) 80 bpm 89 bpm 91 bpm 99 bpm    Heart Rate  (Exercise) 105 bpm 115 bpm 135 bpm 137 bpm    Heart Rate (Exit) 98 bpm 82 bpm 102 bpm 108 bpm    Oxygen Saturation (Admit) 98 % 97 % 98 % 99 %    Oxygen Saturation (Exercise) 97 % 97 % 96 % 96 %    Oxygen Saturation (Exit) 98 % 97 % 97 % 98 %    Rating of Perceived Exertion (Exercise) 15 13 13 13     Perceived Dyspnea (Exercise) 1 1 1 1     Duration Progress to 45 minutes of aerobic exercise without signs/symptoms of physical distress Progress to 45 minutes of aerobic exercise without signs/symptoms of physical distress Progress to 45 minutes of aerobic exercise without signs/symptoms of physical distress Progress to 45 minutes of aerobic exercise without signs/symptoms of physical distress    Intensity -  40-80% of THRR THRR unchanged THRR  unchanged THRR unchanged      Progression   Progression Continue to progress workloads to maintain intensity without signs/symptoms of physical distress. Continue to progress workloads to maintain intensity without signs/symptoms of physical distress. Continue to progress workloads to maintain intensity without signs/symptoms of physical distress. Continue to progress workloads to maintain intensity without signs/symptoms of physical distress.      Resistance Training   Training Prescription Yes Yes Yes Yes    Weight orange bands orange bands orange bands orange bands    Reps 10-12  10 minutes of strength training 10-12  10 minutes of strength training 10-12  10 minutes of strength training 10-12  10 minutes of strength training      Treadmill   MPH  - 2.3 2.9 3.1    Grade  - 1 3 6     Minutes  - 17 17 17       Bike   Level 0.5 0.7 0.7  -    Minutes 17 17 17   -      NuStep   Level 2  -  -  -    Minutes 17  -  -  -    METs 1.8  -  -  -      Rower   Level  -  - 2 2    Minutes  -  - 17 17      Track   Laps 18  -  -  -    Minutes 17  -  -  -       Exercise Comments:     Exercise Comments    Row Name 03/17/16 1149           Exercise  Comments Patient has only attended 4 exercise sessions. Will cont. to monitor and progress.           Discharge Exercise Prescription (Final Exercise Prescription Changes):     Exercise Prescription Changes - 03/06/16 1500      Exercise Review   Progression Yes     Response to Exercise   Blood Pressure (Admit) 108/58   Blood Pressure (Exercise) 130/70   Blood Pressure (Exit) 118/62   Heart Rate (Admit) 99 bpm   Heart Rate (Exercise) 137 bpm   Heart Rate (Exit) 108 bpm   Oxygen Saturation (Admit) 99 %   Oxygen Saturation (Exercise) 96 %   Oxygen Saturation (Exit) 98 %   Rating of Perceived Exertion (Exercise) 13   Perceived Dyspnea (Exercise) 1   Duration Progress to 45 minutes of aerobic exercise without signs/symptoms of physical distress   Intensity THRR unchanged     Progression   Progression Continue to progress workloads to maintain intensity without signs/symptoms of physical distress.     Resistance Training   Training Prescription Yes   Weight orange bands   Reps 10-12  10 minutes of strength training     Treadmill   MPH 3.1   Grade 6   Minutes 17     Rower   Level 2   Minutes 17       Nutrition:  Target Goals: Understanding of nutrition guidelines, daily intake of sodium 1500mg , cholesterol 200mg , calories 30% from fat and 7% or less from saturated fats, daily to have 5 or more servings of fruits and vegetables.  Biometrics:    Nutrition Therapy Plan and Nutrition Goals:   Nutrition Discharge: Rate Your Plate Scores:   Psychosocial: Target Goals: Acknowledge presence or absence of depression, maximize  coping skills, provide positive support system. Participant is able to verbalize types and ability to use techniques and skills needed for reducing stress and depression.  Initial Review & Psychosocial Screening:     Initial Psych Review & Screening - 02/08/16 [place]? Yes     Barriers    Psychosocial barriers to participate in program There are no identifiable barriers or psychosocial needs.     Screening Interventions   Interventions Encouraged to exercise      Quality of Life Scores:   PHQ-9: Recent Review Flowsheet Data    Depression screen [facility] 2/9 02/08/2016 03/18/2013   Decreased Interest 0 0   Down, Depressed, Hopeless 0 0   PHQ - 2 Score 0 0      Psychosocial Evaluation and Intervention:     Psychosocial Evaluation - 02/08/16 1240      Psychosocial Evaluation & Interventions   Interventions Encouraged to exercise with the program and follow exercise prescription      Psychosocial Re-Evaluation:     Psychosocial Re-Evaluation    [place] Name 02/21/16 0842 03/17/16 0834           Psychosocial Re-Evaluation   Interventions Encouraged to attend Pulmonary Rehabilitation for the exercise Encouraged to attend Pulmonary Rehabilitation for the exercise      Comments no psychosocial issues identified at this time no psychosocial issues identified at this time.      Continued Psychosocial Services Needed No No        Education: Education Goals: Education classes will be provided on a weekly basis, covering required topics. Participant will state understanding/return demonstration of topics presented.  Learning Barriers/Preferences:   Education Topics: Risk Factor Reduction:  -Group instruction that is supported by a PowerPoint presentation. Instructor discusses the definition of a risk factor, different risk factors for pulmonary disease, and how the heart and lungs work together.     Nutrition for Pulmonary Patient:  -Group instruction provided by PowerPoint slides, verbal discussion, and written materials to support subject matter. The instructor gives an explanation and review of healthy diet recommendations, which includes a discussion on weight management, recommendations for fruit and vegetable consumption, as well as protein, fluid, caffeine,  fiber, sodium, sugar, and alcohol. Tips for eating when patients are short of breath are discussed.   Pursed Lip Breathing:  -Group instruction that is supported by demonstration and informational handouts. Instructor discusses the benefits of pursed lip and diaphragmatic breathing and detailed demonstration on how to preform both.     Oxygen Safety:  -Group instruction provided by PowerPoint, verbal discussion, and written material to support subject matter. There is an overview of "What is Oxygen" and "Why do we need it".  Instructor also reviews how to create a safe environment for oxygen use, the importance of using oxygen as prescribed, and the risks of noncompliance. There is a brief discussion on traveling with oxygen and resources the patient may utilize.   Oxygen Equipment:  -Group instruction provided by [facility] Staff utilizing handouts, written materials, and equipment demonstrations.   Signs and Symptoms:  -Group instruction provided by written material and verbal discussion to support subject matter. Warning signs and symptoms of infection, stroke, and heart attack are reviewed and when to call the physician/911 reinforced. Tips for preventing the spread of infection discussed. Flowsheet Row PULMONARY REHAB OTHER RESPIRATORY from 03/06/2016 in [place]  Date  03/06/16  Educator  rn  Instruction Review Code  2- meets goals/outcomes      Advanced Directives:  -Group instruction provided by verbal instruction and written material to support subject matter. Instructor reviews Advanced Directive laws and proper instruction for filling out document.   Pulmonary Video:  -Group video education that reviews the importance of medication and oxygen compliance, exercise, good nutrition, pulmonary hygiene, and pursed lip and diaphragmatic breathing for the pulmonary patient.   Exercise for the Pulmonary Patient:  -Group instruction that is  supported by a PowerPoint presentation. Instructor discusses benefits of exercise, core components of exercise, frequency, duration, and intensity of an exercise routine, importance of utilizing pulse oximetry during exercise, safety while exercising, and options of places to exercise outside of rehab.     Pulmonary Medications:  -Verbally interactive group education provided by instructor with focus on inhaled medications and proper administration.   Anatomy and Physiology of the Respiratory System and Intimacy:  -Group instruction provided by PowerPoint, verbal discussion, and written material to support subject matter. Instructor reviews respiratory cycle and anatomical components of the respiratory system and their functions. Instructor also reviews differences in obstructive and restrictive respiratory diseases with examples of each. Intimacy, Sex, and Sexuality differences are reviewed with a discussion on how relationships can change when diagnosed with pulmonary disease. Common sexual concerns are reviewed.   Knowledge Questionnaire Score:   Core Components/Risk Factors/Patient Goals at Admission:     Personal Goals and Risk Factors at Admission - 02/08/16 1240      Core Components/Risk Factors/Patient Goals on Admission    Weight Management Yes;Obesity   Intervention Weight Management: Develop a combined nutrition and exercise program designed to reach desired caloric intake, while maintaining appropriate intake of nutrient and fiber, sodium and fats, and appropriate energy expenditure required for the weight goal.   Expected Outcomes Short Term: Continue to assess and modify interventions until short term weight is achieved;Long Term: Adherence to nutrition and physical activity/exercise program aimed toward attainment of established weight goal;Weight Loss: Understanding of general recommendations for a balanced deficit meal plan, which promotes 1-2 lb weight loss per week and  includes a negative energy balance of [phone] kcal/d;Understanding recommendations for meals to include 15-35% energy as protein, 25-35% energy from fat, 35-60% energy from carbohydrates, less than 200mg  of dietary cholesterol, 20-35 gm of total fiber daily   Increase Strength and Stamina Yes   Intervention Provide advice, education, support and counseling about physical activity/exercise needs.;Develop an individualized exercise prescription for aerobic and resistive training based on initial evaluation findings, risk stratification, comorbidities and participant's personal goals.   Expected Outcomes Achievement of increased cardiorespiratory fitness and enhanced flexibility, muscular endurance and strength shown through measurements of functional capacity and personal statement of participant.   Improve shortness of breath with ADL's Yes   Intervention Provide education, individualized exercise plan and daily activity instruction to help decrease symptoms of SOB with activities of daily living.   Expected Outcomes Short Term: Achieves a reduction of symptoms when performing activities of daily living.   Develop more efficient breathing techniques such as purse lipped breathing and diaphragmatic breathing; and practicing self-pacing with activity Yes   Intervention Provide education, demonstration and support about specific breathing techniuqes utilized for more efficient breathing. Include techniques such as pursed lipped breathing, diaphragmatic breathing and self-pacing activity.   Expected Outcomes Short Term: Participant will be able to demonstrate and use breathing techniques as needed throughout daily activities.   Increase knowledge of respiratory medications and ability to use respiratory  devices properly  Yes   Intervention Provide education and demonstration as needed of appropriate use of medications, inhalers, and oxygen therapy.   Expected Outcomes Short Term: Achieves understanding of  medications use. Understands that oxygen is a medication prescribed by physician. Demonstrates appropriate use of inhaler and oxygen therapy.      Core Components/Risk Factors/Patient Goals Review:      Goals and Risk Factor Review    Row Name 02/21/16 0840 03/17/16 0830           Core Components/Risk Factors/Patient Goals Review   Personal Goals Review Weight Management/Obesity;Increase knowledge of respiratory medications and ability to use respiratory devices properly.;Improve shortness of breath with ADL's;Develop more efficient breathing techniques such as purse lipped breathing and diaphragmatic breathing and practicing self-pacing with activity.;Increase Strength and Stamina  -      Review Has only attended 1 exercise session, too early to have any improvement Has only attended 4 exercise sessions due to illness on 2 different occasions,  beginning to increase workloads and is tolerating well, no weight loss as of yet.      Expected Outcomes Should see an improvement in goals in the next 30 days Hopefully if attendance is regular she should see a marked improvement in strength, stamina, and shortness of breath, encourage weight loss.         Core Components/Risk Factors/Patient Goals at Discharge (Final Review):      Goals and Risk Factor Review - 03/17/16 0830      Core Components/Risk Factors/Patient Goals Review   Review Has only attended 4 exercise sessions due to illness on 2 different occasions,  beginning to increase workloads and is tolerating well, no weight loss as of yet.   Expected Outcomes Hopefully if attendance is regular she should see a marked improvement in strength, stamina, and shortness of breath, encourage weight loss.      ITP Comments:   Comments: ITP REVIEW Pt is making expected progress toward pulmonary rehab goals after completing 4 sessions. Recommend continued exercise, life style modification, education, and utilization of breathing techniques  to increase stamina and strength and decrease shortness of breath with exertion.

## 2016-03-18 NOTE — Telephone Encounter (Signed)
Caller name: Relationship to patient: Self Can be reached: [phone] Pharmacy:  [id number] - [place], [place]. [address] [phone] (Phone) [phone] (Fax)     Reason for call: Request refill on Prednisone

## 2016-03-19 DIAGNOSIS — M545 Low back pain: Secondary | ICD-10-CM | POA: Diagnosis not present

## 2016-03-19 DIAGNOSIS — M25512 Pain in left shoulder: Secondary | ICD-10-CM | POA: Diagnosis not present

## 2016-03-19 DIAGNOSIS — M542 Cervicalgia: Secondary | ICD-10-CM | POA: Diagnosis not present

## 2016-03-19 DIAGNOSIS — M25511 Pain in right shoulder: Secondary | ICD-10-CM | POA: Diagnosis not present

## 2016-03-19 NOTE — Telephone Encounter (Signed)
Patient calling to follow up on request for refill on prednisone. Plse adv

## 2016-03-20 ENCOUNTER — Encounter (HOSPITAL_COMMUNITY): Payer: BLUE CROSS/BLUE SHIELD

## 2016-03-20 ENCOUNTER — Other Ambulatory Visit (INDEPENDENT_AMBULATORY_CARE_PROVIDER_SITE_OTHER): Payer: BLUE CROSS/BLUE SHIELD

## 2016-03-20 ENCOUNTER — Ambulatory Visit (INDEPENDENT_AMBULATORY_CARE_PROVIDER_SITE_OTHER): Payer: BLUE CROSS/BLUE SHIELD | Admitting: Internal Medicine

## 2016-03-20 ENCOUNTER — Encounter: Payer: Self-pay | Admitting: Internal Medicine

## 2016-03-20 VITALS — BP 112/76 | HR 105 | Ht 64.0 in | Wt 215.0 lb

## 2016-03-20 DIAGNOSIS — M542 Cervicalgia: Secondary | ICD-10-CM | POA: Diagnosis not present

## 2016-03-20 DIAGNOSIS — I519 Heart disease, unspecified: Secondary | ICD-10-CM | POA: Diagnosis not present

## 2016-03-20 DIAGNOSIS — M25511 Pain in right shoulder: Secondary | ICD-10-CM | POA: Diagnosis not present

## 2016-03-20 DIAGNOSIS — R5383 Other fatigue: Secondary | ICD-10-CM

## 2016-03-20 DIAGNOSIS — M2550 Pain in unspecified joint: Secondary | ICD-10-CM | POA: Diagnosis not present

## 2016-03-20 DIAGNOSIS — I5189 Other ill-defined heart diseases: Secondary | ICD-10-CM | POA: Insufficient documentation

## 2016-03-20 DIAGNOSIS — R0600 Dyspnea, unspecified: Secondary | ICD-10-CM

## 2016-03-20 DIAGNOSIS — M25512 Pain in left shoulder: Secondary | ICD-10-CM | POA: Diagnosis not present

## 2016-03-20 DIAGNOSIS — E669 Obesity, unspecified: Secondary | ICD-10-CM

## 2016-03-20 DIAGNOSIS — M545 Low back pain: Secondary | ICD-10-CM | POA: Diagnosis not present

## 2016-03-20 LAB — SEDIMENTATION RATE: Sed Rate: 26 mm/hr — ABNORMAL HIGH (ref 0–20)

## 2016-03-20 MED ORDER — PREDNISONE 20 MG PO TABS: ORAL_TABLET | ORAL | 0 refills | Status: DC

## 2016-03-20 NOTE — Telephone Encounter (Signed)
Per [facility], patient refused to schedule appt

## 2016-03-20 NOTE — Telephone Encounter (Signed)
I refilled pt prednisone. Working on getting her in with ENT to evaluate temporal area and maybe do study. So when that appointment made. Please attend.

## 2016-03-20 NOTE — Patient Instructions (Addendum)
ICD-9-CM ICD-10-CM   1. Dyspnea, unspecified type 786.09 R06.00   2. Arthralgia, unspecified joint 719.40 M25.50   3. Fatigue, unspecified type 780.79 R53.83   4. Diastolic dysfunction [id number] I51.9   5. Obesity (BMI 35.0-39.9 without comorbidity) 278.00 E66.9    -re-refer pulmonary rehab - take [facility] low glycemic sheet - eat foods on left lane but advise nutritional support including [facility] if interested - check ANA, RF, DS DNA, CCP, SSA, SSB, Sed rate, scl-70, ANCA autoimmune antibodies - talk to your cardiologist about diastolic dysfunction  Return in 9 months or sooner if needed

## 2016-03-20 NOTE — Progress Notes (Signed)
Subjective:     Patient ID: Lorraine Conrad, female   DOB: [birth year], 45 y.o.   MRN: [id number]  HPI  IOV 12/25/2015  Chief Complaint  Patient presents with  . Advice Only    Referred by Dr. Radford Pax for worsening intermittent dyspnea Xseveral years.      45 year old female with a BMI of 36.7:. Presents insidious onset of dyspnea for the last 4-5 years. She says that approximately 3 years ago she had a hysterectomy and after that her dyspnea improved for 6 months but then returned. Initially she thought dyspnea was related to her menstrual cycles for the fact it returned after her hysterectomy suggest that it is not according to her. Symptoms are moderate to severe. It is episodic and random. Sometime shortness of breath happens with exertion sometimes not. Sometimes it happens at rest and sometimes not. This no clear-cut aggravating or specific relieving factors. All the rest periodically relieves it. She is not sure if it is progressive but it certainly is significant enough that it is affecting her quality of life. She's had extensive evaluation for this as documented below. She is very frustrated by this. Generally this no cough or wheezing but one time she thought she coughs. Several months ago primary care physician gave her inhalers she thinks that albuterol might help she did not use it in several weeks. 9 she was also given Qvar which has not helped.  Early 2017: saw ENT and says some bx ? Temp aretery being considered  08/13/2012: VO2 max of 83. On the anaerobic threshold. Blunted systolic blood pressure response. RER of 1.06  03/18/2013: ANA antibody negative  05/08/2013: Rheumatoid factor and ANA again negative.  03/01/2014: CSF Lyme negative  05/26/2014: Treadmill exercise test EKG: Achieved 10 mets. Stopped due to. Fatigue. Normal EKG stress test  03/01/2015 Acid: Receptor antibody negative.: Duplex lower extremity ultrasound negative for DVT. Same day CT angiogram negative for  pulmonary embolism. HIV negative on the same day.  05/09/2015: CT cervical spine: Is normal other than straightening of cervical spine  08/23/2015: CT angiogram chest: Negative for pulmonary embolism. Lung parenchyma reported clear although to me looks like air trapping  08/27/2015; hemoglobin 13.2 g percent and stable. Troponin normal.Creatinine 1.04 mg percent. ESR 20  09/13/2015 myocardial perfusion cardiac stress test: Normal ejection fraction 65%. Low risk study.  09/27/2015; transthoracic echocardiogram is normal and normal ejection fraction and mild mitral regurgitation. 1 diastolic dysfunction  40/98/1191: BNP normal 17.6. CT cardiac scoring: Score 0. Lung parenchyma looks clear to me  12/25/2015  - Walking desaturation test 185 feet 3 laps on room air: Did not desaturate. Exhaled nitric oxide test: 16ppb and normal   OV 03/20/2016  Chief Complaint  Patient presents with  . Follow-up    3 month follow up. Pt states breathing is unchanged since last ov.    Dyspnea fu  After I last saw her she in the interim had: 12/31/2015 - Methacholine challenge negative for asthma - consistent with low feno. She might have vocal cord fluctuation ; seen in one of the loops  She then had pulmonary stress test 01/11/2016: That shows excellent effort but low VO2 max but normalizes when corrected for ideal body weight. Hyperventilatory response and also a flat O2 pulse:. The combination suggest diastolic dysfunction and obesity as cause of dyspnea.  She is here with her husband to discuss these results. She started pulmonary rehabilitation but only went for 4 sessions and then do the soreness.Marland Kitchen She willing to  start this again. She is also reporting years of nonspecific body inflammation which is unclear and she wants me to test her for this.   has a past medical history of Allergy; Anemia; Arthritis; Chronic lower back pain; Endometriosis; Erosive gastritis; External hemorrhoids; GERD  (gastroesophageal reflux disease); H/O shortness of breath (2014); Headache; Migraine (1998; 02/2014); OSA on CPAP; Ovarian cyst, left; and Proctalgia fugax.   reports that she has never smoked. She has never used smokeless tobacco.  Past Surgical History:  Procedure Laterality Date  . ABDOMINAL HYSTERECTOMY  04/2013  . BREAST BIOPSY Bilateral   . BREAST LUMPECTOMY Left ~ 1993  . CESAREAN SECTION  2002; 2006  . COLONOSCOPY    . LAPAROSCOPIC OVARIAN CYSTECTOMY  ~ 1999  . NASAL SEPTUM SURGERY  ~ 1991  . TEMPOROMANDIBULAR JOINT SURGERY  ~ 60  . UPPER GASTROINTESTINAL ENDOSCOPY      Allergies  Allergen Reactions  . Promethazine Hcl Other (See Comments)    Violent tremors   . Cephalexin Diarrhea and Itching  . Codeine Other (See Comments)    Was a child when she took this.  . Dilaudid [Hydromorphone] Nausea And Vomiting  . Azithromycin Rash    Head to toe rash    Immunization History  Administered Date(s) Administered  . Influenza-Unspecified 08/04/2015  . Td 12/11/2014  . Tdap 01/16/2014    Family History  Problem Relation Age of Onset  . Breast cancer Maternal Grandmother   . Prostate cancer Paternal Grandfather   . Colon polyps Father   . Heart disease Father   . Colon cancer Neg Hx      Current Outpatient Prescriptions:  .  albuterol (PROVENTIL HFA;VENTOLIN HFA) 108 (90 BASE) MCG/ACT inhaler, Inhale 2 puffs into the lungs every 6 (six) hours as needed for wheezing or shortness of breath., Disp: 1 Inhaler, Rfl: 3 .  azelastine (ASTELIN) 0.1 % nasal spray, Place 2 sprays into both nostrils 2 (two) times daily. Use in each nostril as directed, Disp: 30 mL, Rfl: 3 .  beclomethasone (QVAR) 40 MCG/ACT inhaler, Inhale 2 puffs into the lungs 2 (two) times daily at 10 AM and 5 PM., Disp: 1 Inhaler, Rfl: 3 .  conjugated estrogens (PREMARIN) vaginal cream, Use one time per weekly at bedtime-one fourth applicator vaginally, Disp: , Rfl:  .  cyclobenzaprine (FLEXERIL) 10 MG  tablet, Take 10 mg by mouth., Disp: , Rfl:  .  diclofenac (VOLTAREN) 75 MG EC tablet, Take 1 tablet (75 mg total) by mouth 2 (two) times daily., Disp: 30 tablet, Rfl: 1 .  fluticasone (FLONASE) 50 MCG/ACT nasal spray, Place 2 sprays into both nostrils daily., Disp: 16 g, Rfl: 1 .  hydrOXYzine (ATARAX/VISTARIL) 10 MG tablet, Take 1 tablet (10 mg total) by mouth 3 (three) times daily as needed for itching., Disp: 30 tablet, Rfl: 0 .  lansoprazole (PREVACID) 30 MG capsule, Take 30 mg by mouth daily at 12 noon., Disp: , Rfl:  .  methocarbamol (ROBAXIN) 500 MG tablet, Take 1 tablet (500 mg total) by mouth every 6 (six) hours as needed for muscle spasms., Disp: 20 tablet, Rfl: 0 .  mometasone (NASONEX) 50 MCG/ACT nasal spray, USE 2 SPRAYS IN EACH NOSTRIL EVERY DAY, Disp: 17 g, Rfl: 1 .  fluconazole (DIFLUCAN) 150 MG tablet, Take 150 mg by mouth every other day., Disp: , Rfl:  .  guaiFENesin (MUCINEX) 600 MG 12 hr tablet, Take 1,200 mg by mouth 2 (two) times daily., Disp: , Rfl:  .  mupirocin  ointment (BACTROBAN) 2 %, Place 1 application into the nose 2 (two) times daily. (Patient not taking: Reported on 03/20/2016), Disp: 22 g, Rfl: 0 .  nitroGLYCERIN (NITRODUR - DOSED IN MG/24 HR) 0.2 mg/hr patch, Apply 1/4th patch to affected area of ankle, change daily (Patient not taking: Reported on 03/20/2016), Disp: 30 patch, Rfl: 1 .  Probiotic Product (PROBIOTIC-10) CAPS, Take by mouth., Disp: , Rfl:  .  sertraline (ZOLOFT) 50 MG tablet, Take 50 mg by mouth., Disp: , Rfl:    Review of Systems     Objective:   Physical Exam Vitals:   03/20/16 1212  BP: 112/76  Pulse: (!) 105  SpO2: 99%  Weight: 215 lb (97.5 kg)  Height: _0  (1.626 m)   Estimated body mass index is 36.9 kg/m as calculated from the following:   Height as of this encounter: _1  (1.626 m).   Weight as of this encounter: 215 lb (97.5 kg).  Discussion only visit     Assessment:       ICD-9-CM ICD-10-CM   1. Dyspnea, unspecified  type 786.09 R06.00   2. Arthralgia, unspecified joint 719.40 M25.50   3. Fatigue, unspecified type 780.79 R53.83   4. Diastolic dysfunction 037.0 I51.9   5. Obesity (BMI 35.0-39.9 without comorbidity) 278.00 E66.9        Plan:      -re-refer pulmonary rehab - take [facility] low glycemic sheet - eat foods on left lane but advise nutritional support including [facility] if interested - check ANA, RF, DS DNA, CCP, SSA, SSB, Sed rate, scl-70, ANCA autoimmune antibodies - talk to your cardiologist about diastolic dysfunction  Return in 9 months or sooner if needed  (> 50% of this 15 min visit spent in face to face counseling or/and coordination of care)  Dr. Brand Males, M.D., [facility].C.P Pulmonary and Critical Care Medicine Staff Physician [place] [facility] Pager: [phone], If no answer or between  15:00h - 7:00h: call [phone]  03/20/2016 12:41 PM

## 2016-03-20 NOTE — Telephone Encounter (Signed)
Pt refused ENT referral per [facility]. Will you call pt and let her know I do recommend ENT evaluation. I gave her 5 more days of prednisone. Have update [place] if she is having any temporal pain in 5 days. If she has any vision changes with temporal pain then advise ED evaluation. I need to know how she is in 5 days. If any persisting symptoms would again try to get her in with ENT again.

## 2016-03-20 NOTE — Telephone Encounter (Signed)
Pt called back requesting refill of prednisone. So I was wondering on status of her ENT referral?

## 2016-03-21 ENCOUNTER — Ambulatory Visit: Payer: BLUE CROSS/BLUE SHIELD | Admitting: Internal Medicine

## 2016-03-21 DIAGNOSIS — M542 Cervicalgia: Secondary | ICD-10-CM | POA: Diagnosis not present

## 2016-03-21 DIAGNOSIS — M25512 Pain in left shoulder: Secondary | ICD-10-CM | POA: Diagnosis not present

## 2016-03-21 DIAGNOSIS — M25511 Pain in right shoulder: Secondary | ICD-10-CM | POA: Diagnosis not present

## 2016-03-21 DIAGNOSIS — M545 Low back pain: Secondary | ICD-10-CM | POA: Diagnosis not present

## 2016-03-21 LAB — SJOGRENS SYNDROME-B EXTRACTABLE NUCLEAR ANTIBODY: SSB (La) (ENA) Antibody, IgG: <1

## 2016-03-21 LAB — SJOGRENS SYNDROME-A EXTRACTABLE NUCLEAR ANTIBODY: SSA (RO) (ENA) ANTIBODY, IGG: NEGATIVE

## 2016-03-21 LAB — ANCA SCREEN W REFLEX TITER: ANCA Screen: POSITIVE — AB

## 2016-03-21 LAB — CYCLIC CITRUL PEPTIDE ANTIBODY, IGG: Cyclic Citrullin Peptide Ab: <16 Units

## 2016-03-21 LAB — C-ANCA TITER: C-ANCA: 1:40 {titer} — ABNORMAL HIGH

## 2016-03-21 LAB — ANA: ANA: NEGATIVE

## 2016-03-21 LAB — RHEUMATOID FACTOR

## 2016-03-21 LAB — ANTI-DNA ANTIBODY, DOUBLE-STRANDED: DS DNA AB: 1 [IU]/mL

## 2016-03-24 DIAGNOSIS — M25511 Pain in right shoulder: Secondary | ICD-10-CM | POA: Diagnosis not present

## 2016-03-24 DIAGNOSIS — M25512 Pain in left shoulder: Secondary | ICD-10-CM | POA: Diagnosis not present

## 2016-03-24 DIAGNOSIS — M545 Low back pain: Secondary | ICD-10-CM | POA: Diagnosis not present

## 2016-03-24 DIAGNOSIS — M542 Cervicalgia: Secondary | ICD-10-CM | POA: Diagnosis not present

## 2016-03-25 ENCOUNTER — Encounter (HOSPITAL_COMMUNITY): Payer: BLUE CROSS/BLUE SHIELD

## 2016-03-25 ENCOUNTER — Telehealth: Payer: Self-pay | Admitting: Internal Medicine

## 2016-03-25 NOTE — Telephone Encounter (Signed)
All autoimmune negative but c-anca trace positive. Not sure if this means there is vasculitis. Could be false positive but she would need to proceed to next step which is repeat lab draw but for MPO and PR-3 antibodies but before ordering please see if we did it the other day or not  Thanks  Dr. Brand Males, M.D., [facility].C.P Pulmonary and Critical Care Medicine Staff Physician [place] [facility] Pager: [phone], If no answer or between  15:00h - 7:00h: call [phone]  03/25/2016 4:44 PM

## 2016-03-26 NOTE — Telephone Encounter (Signed)
Called patient, no answer. Answering machine did not come on. Mailed copy of message from E. Saguier. So that patient will be able to refer to information.Patient does have my chart.

## 2016-03-26 NOTE — Telephone Encounter (Signed)
Called patient who states she did get the Prednisone. States she refuses ENT because they want to cut on a tube near her temple and they makes her uncomfortable. Wants to know if there is some other way she can have area tested.

## 2016-03-26 NOTE — Telephone Encounter (Signed)
Called and spoke to pt. Informed her of the results and recs per MR. Pt verbalized understanding and denied any further questions or concerns at this time.   

## 2016-03-26 NOTE — Telephone Encounter (Signed)
Let pt know to evaluate inflammation of the temporal artery the work up is temporal artery biopsy. Good that she took prednisone but this is second time she had pain in this area and mild elevated sed rate. So would like to get full work up.  She declined ENT evaluation in the past as well.  Is she feeling better??  If she come back in the future with same signs and symptoms along with elevated sed rate then would refer again to ENT or other that do the biopsy.   The condition we are working up and concerned about is temporal arteritis. She could google that and get understanding.

## 2016-03-27 ENCOUNTER — Ambulatory Visit (INDEPENDENT_AMBULATORY_CARE_PROVIDER_SITE_OTHER): Payer: BLUE CROSS/BLUE SHIELD | Admitting: Medical

## 2016-03-27 ENCOUNTER — Encounter: Payer: Self-pay | Admitting: Medical

## 2016-03-27 ENCOUNTER — Encounter (HOSPITAL_COMMUNITY): Admission: RE | Admit: 2016-03-27 | Payer: BLUE CROSS/BLUE SHIELD | Source: Ambulatory Visit

## 2016-03-27 VITALS — BP 120/80 | HR 92 | Temp 98.1°F | Ht 64.0 in | Wt 216.0 lb

## 2016-03-27 DIAGNOSIS — J040 Acute laryngitis: Secondary | ICD-10-CM | POA: Diagnosis not present

## 2016-03-27 DIAGNOSIS — J302 Other seasonal allergic rhinitis: Secondary | ICD-10-CM

## 2016-03-27 DIAGNOSIS — J3489 Other specified disorders of nose and nasal sinuses: Secondary | ICD-10-CM

## 2016-03-27 DIAGNOSIS — M542 Cervicalgia: Secondary | ICD-10-CM | POA: Diagnosis not present

## 2016-03-27 DIAGNOSIS — M545 Low back pain: Secondary | ICD-10-CM | POA: Diagnosis not present

## 2016-03-27 DIAGNOSIS — H6690 Otitis media, unspecified, unspecified ear: Secondary | ICD-10-CM

## 2016-03-27 DIAGNOSIS — R700 Elevated erythrocyte sedimentation rate: Secondary | ICD-10-CM

## 2016-03-27 DIAGNOSIS — M25512 Pain in left shoulder: Secondary | ICD-10-CM | POA: Diagnosis not present

## 2016-03-27 DIAGNOSIS — M25511 Pain in right shoulder: Secondary | ICD-10-CM | POA: Diagnosis not present

## 2016-03-27 LAB — SEDIMENTATION RATE: SED RATE: 10 mm/h (ref 0–20)

## 2016-03-27 MED ORDER — DICLOFENAC SODIUM 75 MG PO TBEC: 75.0000 mg | DELAYED_RELEASE_TABLET | Freq: Two times a day (BID) | ORAL | 1 refills | Status: DC

## 2016-03-27 MED ORDER — DOXYCYCLINE HYCLATE 100 MG PO TABS: 100.0000 mg | ORAL_TABLET | Freq: Two times a day (BID) | ORAL | 0 refills | Status: DC

## 2016-03-27 NOTE — Progress Notes (Signed)
Subjective:    Patient ID: Lorraine Conrad, female    DOB: 1970-12-26, 45 y.o.   MRN: [id number]  HPI  Pt in for hoarse voice. Pt states some runny nose and watery eyes. No sneezing. 2 days ago some pnd. But also when she cleared throat and when she would  spit would see mucous.  Pt states about 4 days before hoarse voice had dry cough on and off.   Maybe some mild chest congested feeling.  Pt has not been overusing her voice.  Husband was sick before he left on business trip.     Review of Systems  Constitutional: Negative for chills, fatigue and fever.  HENT: Positive for congestion, postnasal drip, rhinorrhea and voice change. Negative for ear pain, sneezing, sore throat, tinnitus and trouble swallowing.        Very hoarse voice.  Ears feel full. Lt side worse than rt.  Eyes: Negative for redness and visual disturbance.       Eyes were watery.  Respiratory: Positive for cough. Negative for chest tightness, shortness of breath and wheezing.   Cardiovascular: Negative for chest pain and palpitations.  Gastrointestinal: Negative for abdominal pain.  Musculoskeletal: Negative for back pain.  Neurological: Negative for dizziness, seizures, weakness, numbness and headaches.  Hematological: Negative for adenopathy. Does not bruise/bleed easily.    Past Medical History:  Diagnosis Date  . Allergy    SEASONAL  . Anemia   . Arthritis    "spine" (03/01/2014)  . Chronic lower back pain   . Endometriosis   . Erosive gastritis   . External hemorrhoids   . GERD (gastroesophageal reflux disease)   . H/O shortness of breath 2014   Cardiopulmonary exercise test results  . Headache    "probably monthly" (03/01/2014)  . Migraine 1998; 02/2014   "this one's lasted 10 days straight" (03/01/2014)  . OSA on CPAP   . Ovarian cyst, left   . Proctalgia fugax      Social History   Social History  . Marital status: Married    Spouse name: N/A  . Number of children: N/A  . Years of  education: N/A   Occupational History  . Not on file.   Social History Main Topics  . Smoking status: Never Smoker  . Smokeless tobacco: Never Used     Comment: father smoked in house growing up per pt.   . Alcohol use 0.0 oz/week     Comment: 03/01/2014 "I'll have 1-2 drinks maybe 3-4 times/yr"  . Drug use: No  . Sexual activity: Yes    Birth control/ protection: Surgical   Other Topics Concern  . Not on file   Social History Narrative   Daily caffiene use: 6 cups per day   Patient does not get regular excercise    Past Surgical History:  Procedure Laterality Date  . ABDOMINAL HYSTERECTOMY  04/2013  . BREAST BIOPSY Bilateral   . BREAST LUMPECTOMY Left ~ 1993  . CESAREAN SECTION  2002; 2006  . COLONOSCOPY    . LAPAROSCOPIC OVARIAN CYSTECTOMY  ~ 1999  . NASAL SEPTUM SURGERY  ~ 1991  . TEMPOROMANDIBULAR JOINT SURGERY  ~ 60  . UPPER GASTROINTESTINAL ENDOSCOPY      Family History  Problem Relation Age of Onset  . Breast cancer Maternal Grandmother   . Prostate cancer Paternal Grandfather   . Colon polyps Father   . Heart disease Father   . Colon cancer Neg Hx     Allergies  Allergen Reactions  . Promethazine Hcl Other (See Comments)    Violent tremors   . Cephalexin Diarrhea and Itching  . Codeine Other (See Comments)    Was a child when she took this.  . Dilaudid [Hydromorphone] Nausea And Vomiting  . Azithromycin Rash    Head to toe rash    Current Outpatient Prescriptions on File Prior to Visit  Medication Sig Dispense Refill  . albuterol (PROVENTIL HFA;VENTOLIN HFA) 108 (90 BASE) MCG/ACT inhaler Inhale 2 puffs into the lungs every 6 (six) hours as needed for wheezing or shortness of breath. 1 Inhaler 3  . azelastine (ASTELIN) 0.1 % nasal spray Place 2 sprays into both nostrils 2 (two) times daily. Use in each nostril as directed 30 mL 3  . beclomethasone (QVAR) 40 MCG/ACT inhaler Inhale 2 puffs into the lungs 2 (two) times daily at 10 AM and 5 PM. 1  Inhaler 3  . conjugated estrogens (PREMARIN) vaginal cream Use one time per weekly at bedtime-one fourth applicator vaginally    . cyclobenzaprine (FLEXERIL) 10 MG tablet Take 10 mg by mouth.    . diclofenac (VOLTAREN) 75 MG EC tablet Take 1 tablet (75 mg total) by mouth 2 (two) times daily. 30 tablet 1  . fluconazole (DIFLUCAN) 150 MG tablet Take 150 mg by mouth every other day.    . fluticasone (FLONASE) 50 MCG/ACT nasal spray Place 2 sprays into both nostrils daily. 16 g 1  . guaiFENesin (MUCINEX) 600 MG 12 hr tablet Take 1,200 mg by mouth 2 (two) times daily.    . hydrOXYzine (ATARAX/VISTARIL) 10 MG tablet Take 1 tablet (10 mg total) by mouth 3 (three) times daily as needed for itching. 30 tablet 0  . lansoprazole (PREVACID) 30 MG capsule Take 30 mg by mouth daily at 12 noon.    . methocarbamol (ROBAXIN) 500 MG tablet Take 1 tablet (500 mg total) by mouth every 6 (six) hours as needed for muscle spasms. 20 tablet 0  . mometasone (NASONEX) 50 MCG/ACT nasal spray USE 2 SPRAYS IN EACH NOSTRIL EVERY DAY 17 g 1  . mupirocin ointment (BACTROBAN) 2 % Place 1 application into the nose 2 (two) times daily. 22 g 0  . nitroGLYCERIN (NITRODUR - DOSED IN MG/24 HR) 0.2 mg/hr patch Apply 1/4th patch to affected area of ankle, change daily 30 patch 1  . Probiotic Product (PROBIOTIC-10) CAPS Take by mouth.    . sertraline (ZOLOFT) 50 MG tablet Take 50 mg by mouth.     No current facility-administered medications on file prior to visit.     BP 120/80   Pulse 92   Temp 98.1 F (36.7 C) (Oral)   Ht 5\' 4"  (1.626 m)   Wt 216 lb (98 kg)   LMP 02/08/2013   SpO2 94%   BMI 37.08 kg/m       Objective:   Physical Exam  General  Mental Status - Alert. General Appearance - Well groomed. Not in acute distress.  Skin Rashes- No Rashes.  HEENT Head- Normal. Ear Auditory Canal - Left- Normal. Right - Normal.Tympanic Membrane- Left- faint mild red tm. Right- Normal. Eye Sclera/Conjunctiva- Left-  Normal. Right- Normal. Nose & Sinuses Nasal Mucosa- Left-  Boggy and Congested. Right-  Boggy and  Congested.lt side  Maxillary  tender but no frontal sinus pressure. Mouth & Throat Lips: Upper Lip- Normal: no dryness, cracking, pallor, cyanosis, or vesicular eruption. Lower Lip-Normal: no dryness, cracking, pallor, cyanosis or vesicular eruption. Buccal Mucosa- Bilateral- No Aphthous  ulcers. Oropharynx- No Discharge or Erythema. +pnd Tonsils: Characteristics- Bilateral- No Erythema or Congestion. Size/Enlargement- Bilateral- No enlargement. Discharge- bilateral-None.  Neck Neck- Supple. No Masses.   Chest and Lung Exam Auscultation: Breath Sounds:-Clear even and unlabored.  Cardiovascular Auscultation:Rythm- Regular, rate and rhythm. Murmurs & Other Heart Sounds:Ausculatation of the heart reveal- No Murmurs.  Lymphatic Head & Neck General Head & Neck Lymphatics: Bilateral: Description- No Localized lymphadenopathy.       Assessment & Plan:  You seem to have had allergic rhinitis type symptoms recently at onset. Rx flonase to compliment your astelin.  For your hoarse voice rest voice. If hoarse voice persisting past 10 days then would refer to ENT.  For sinus pressure and left OM will rx doxycycline antibiotic.(limited antibiotic choices based on your allergy history)  Follow up in 7-10 days or as needed  Refilled pt diclofenac for her occasional various area of pain.(most recently mild lower lumbar area).But that area better today.  Pt states temporal area does feel better now. She declined temporal  artery biopsy. Will repeat sed rate today to see if level came down. If she has recurrent symptoms again in future she wants to see other specialist but does not want to see ENT that I referred her to.    Emryn Flanery, Percell Miller, PA-C

## 2016-03-27 NOTE — Patient Instructions (Addendum)
You seem to have had allergic rhinitis type symptoms recently at onset. Rx flonase to compliment your astelin.  For your hoarse voice rest voice. If hoarse voice persisting past 10 days then would refer to ENT.  For sinus pressure and left OM will rx doxycycline antibiotic.(limited antibiotic choices based on your allergy history)  Follow up in 7-10 days or as needed

## 2016-03-27 NOTE — Progress Notes (Signed)
Pre visit review using our clinic review tool, if applicable. No additional management support is needed unless otherwise documented below in the visit note. 

## 2016-03-31 DIAGNOSIS — M545 Low back pain: Secondary | ICD-10-CM | POA: Diagnosis not present

## 2016-03-31 DIAGNOSIS — M25511 Pain in right shoulder: Secondary | ICD-10-CM | POA: Diagnosis not present

## 2016-03-31 DIAGNOSIS — M25512 Pain in left shoulder: Secondary | ICD-10-CM | POA: Diagnosis not present

## 2016-03-31 DIAGNOSIS — M542 Cervicalgia: Secondary | ICD-10-CM | POA: Diagnosis not present

## 2016-04-01 ENCOUNTER — Encounter (HOSPITAL_COMMUNITY): Payer: BLUE CROSS/BLUE SHIELD

## 2016-04-01 ENCOUNTER — Telehealth: Payer: Self-pay | Admitting: Internal Medicine

## 2016-04-01 ENCOUNTER — Ambulatory Visit (INDEPENDENT_AMBULATORY_CARE_PROVIDER_SITE_OTHER): Payer: BLUE CROSS/BLUE SHIELD

## 2016-04-01 DIAGNOSIS — L603 Nail dystrophy: Secondary | ICD-10-CM

## 2016-04-01 NOTE — Progress Notes (Signed)
She presents today for Laser therapy   Objective: Onychomycosis.  Assessment: Onychomycosis 5th nails bilateral  Plan: Laser therapy administered to bilateral 5th nails and other nails as prophylactic measurement. All safety y precautions were in place. Pt tolerated procedure well. Re-appointed to follow up in 1 month for 2nd Laser treatment. Treatment was started over due to delay in therapy, due to laser repairs. 3 of 6 laser treatments done so far.

## 2016-04-01 NOTE — Telephone Encounter (Signed)
Called and spoke with pt and she stated that she is still on abx and just finished 10 days of prednisone.  She wanted to make sure that it would be ok to still have labs done while on the abx.  MR please advise. thanks

## 2016-04-02 DIAGNOSIS — M545 Low back pain: Secondary | ICD-10-CM | POA: Diagnosis not present

## 2016-04-02 DIAGNOSIS — M25511 Pain in right shoulder: Secondary | ICD-10-CM | POA: Diagnosis not present

## 2016-04-02 DIAGNOSIS — M25512 Pain in left shoulder: Secondary | ICD-10-CM | POA: Diagnosis not present

## 2016-04-02 DIAGNOSIS — M542 Cervicalgia: Secondary | ICD-10-CM | POA: Diagnosis not present

## 2016-04-02 NOTE — Telephone Encounter (Signed)
Just wait a 5 - 7days and then do the labs to be on safe side

## 2016-04-02 NOTE — Telephone Encounter (Signed)
Spoke with pt. She is aware of MR's recommendation. Nothing further was needed. 

## 2016-04-03 ENCOUNTER — Encounter (HOSPITAL_COMMUNITY): Admission: RE | Admit: 2016-04-03 | Payer: BLUE CROSS/BLUE SHIELD | Source: Ambulatory Visit

## 2016-04-08 ENCOUNTER — Encounter (HOSPITAL_COMMUNITY)
Admission: RE | Admit: 2016-04-08 | Discharge: 2016-04-08 | Disposition: A | Discharge status: Home / Self Care | Payer: BLUE CROSS/BLUE SHIELD | Source: Ambulatory Visit | Attending: Internal Medicine | Admitting: Internal Medicine

## 2016-04-08 DIAGNOSIS — M25511 Pain in right shoulder: Secondary | ICD-10-CM | POA: Diagnosis not present

## 2016-04-08 DIAGNOSIS — M542 Cervicalgia: Secondary | ICD-10-CM | POA: Diagnosis not present

## 2016-04-08 DIAGNOSIS — M545 Low back pain: Secondary | ICD-10-CM | POA: Diagnosis not present

## 2016-04-08 DIAGNOSIS — M25512 Pain in left shoulder: Secondary | ICD-10-CM | POA: Diagnosis not present

## 2016-04-10 ENCOUNTER — Encounter (HOSPITAL_COMMUNITY): Payer: BLUE CROSS/BLUE SHIELD

## 2016-04-11 DIAGNOSIS — M542 Cervicalgia: Secondary | ICD-10-CM | POA: Diagnosis not present

## 2016-04-11 DIAGNOSIS — M25512 Pain in left shoulder: Secondary | ICD-10-CM | POA: Diagnosis not present

## 2016-04-11 DIAGNOSIS — M25511 Pain in right shoulder: Secondary | ICD-10-CM | POA: Diagnosis not present

## 2016-04-11 DIAGNOSIS — M545 Low back pain: Secondary | ICD-10-CM | POA: Diagnosis not present

## 2016-04-15 ENCOUNTER — Telehealth (HOSPITAL_COMMUNITY): Payer: Self-pay | Admitting: *Deleted

## 2016-04-15 ENCOUNTER — Encounter (HOSPITAL_COMMUNITY)
Admission: RE | Admit: 2016-04-15 | Discharge: 2016-04-15 | Disposition: A | Discharge status: Home / Self Care | Payer: BLUE CROSS/BLUE SHIELD | Source: Ambulatory Visit | Attending: Internal Medicine | Admitting: Internal Medicine

## 2016-04-15 DIAGNOSIS — M542 Cervicalgia: Secondary | ICD-10-CM | POA: Diagnosis not present

## 2016-04-15 DIAGNOSIS — R0600 Dyspnea, unspecified: Secondary | ICD-10-CM | POA: Insufficient documentation

## 2016-04-15 DIAGNOSIS — M25512 Pain in left shoulder: Secondary | ICD-10-CM | POA: Diagnosis not present

## 2016-04-15 DIAGNOSIS — M25511 Pain in right shoulder: Secondary | ICD-10-CM | POA: Diagnosis not present

## 2016-04-15 DIAGNOSIS — M545 Low back pain: Secondary | ICD-10-CM | POA: Diagnosis not present

## 2016-04-15 NOTE — Progress Notes (Signed)
Pulmonary Individual Treatment Plan  Patient Details  Name: Lorraine Conrad MRN: [id number] Date of Birth: 1970/08/16 Referring Provider:   April Manson Pulmonary Rehab Walk Test from 02/12/2016 in [place]  Referring Provider  Dr. Chase Caller      Initial Encounter Date:  Flowsheet Row Pulmonary Rehab Walk Test from 02/12/2016 in [place]  Date  02/12/16  Referring Provider  Dr. Chase Caller      Visit Diagnosis: Dyspnea, unspecified type  Patient's Home Medications on Admission:   Current Outpatient Prescriptions:  .  albuterol (PROVENTIL HFA;VENTOLIN HFA) 108 (90 BASE) MCG/ACT inhaler, Inhale 2 puffs into the lungs every 6 (six) hours as needed for wheezing or shortness of breath., Disp: 1 Inhaler, Rfl: 3 .  azelastine (ASTELIN) 0.1 % nasal spray, Place 2 sprays into both nostrils 2 (two) times daily. Use in each nostril as directed, Disp: 30 mL, Rfl: 3 .  beclomethasone (QVAR) 40 MCG/ACT inhaler, Inhale 2 puffs into the lungs 2 (two) times daily at 10 AM and 5 PM., Disp: 1 Inhaler, Rfl: 3 .  conjugated estrogens (PREMARIN) vaginal cream, Use one time per weekly at bedtime-one fourth applicator vaginally, Disp: , Rfl:  .  cyclobenzaprine (FLEXERIL) 10 MG tablet, Take 10 mg by mouth., Disp: , Rfl:  .  diclofenac (VOLTAREN) 75 MG EC tablet, Take 1 tablet (75 mg total) by mouth 2 (two) times daily., Disp: 30 tablet, Rfl: 1 .  doxycycline (VIBRA-TABS) 100 MG tablet, Take 1 tablet (100 mg total) by mouth 2 (two) times daily. Caps or generic ok., Disp: 20 tablet, Rfl: 0 .  fluconazole (DIFLUCAN) 150 MG tablet, Take 150 mg by mouth every other day., Disp: , Rfl:  .  fluticasone (FLONASE) 50 MCG/ACT nasal spray, Place 2 sprays into both nostrils daily., Disp: 16 g, Rfl: 1 .  guaiFENesin (MUCINEX) 600 MG 12 hr tablet, Take 1,200 mg by mouth 2 (two) times daily., Disp: , Rfl:  .  hydrOXYzine (ATARAX/VISTARIL) 10 MG tablet, Take 1  tablet (10 mg total) by mouth 3 (three) times daily as needed for itching., Disp: 30 tablet, Rfl: 0 .  lansoprazole (PREVACID) 30 MG capsule, Take 30 mg by mouth daily at 12 noon., Disp: , Rfl:  .  methocarbamol (ROBAXIN) 500 MG tablet, Take 1 tablet (500 mg total) by mouth every 6 (six) hours as needed for muscle spasms., Disp: 20 tablet, Rfl: 0 .  mometasone (NASONEX) 50 MCG/ACT nasal spray, USE 2 SPRAYS IN EACH NOSTRIL EVERY DAY, Disp: 17 g, Rfl: 1 .  mupirocin ointment (BACTROBAN) 2 %, Place 1 application into the nose 2 (two) times daily., Disp: 22 g, Rfl: 0 .  nitroGLYCERIN (NITRODUR - DOSED IN MG/24 HR) 0.2 mg/hr patch, Apply 1/4th patch to affected area of ankle, change daily, Disp: 30 patch, Rfl: 1 .  Probiotic Product (PROBIOTIC-10) CAPS, Take by mouth., Disp: , Rfl:  .  sertraline (ZOLOFT) 50 MG tablet, Take 50 mg by mouth., Disp: , Rfl:   Past Medical History: Past Medical History:  Diagnosis Date  . Allergy    SEASONAL  . Anemia   . Arthritis    "spine" (03/01/2014)  . Chronic lower back pain   . Endometriosis   . Erosive gastritis   . External hemorrhoids   . GERD (gastroesophageal reflux disease)   . H/O shortness of breath 2014   Cardiopulmonary exercise test results  . Headache    "probably monthly" (03/01/2014)  . Migraine 1998;  02/2014   "this one's lasted 10 days straight" (03/01/2014)  . OSA on CPAP   . Ovarian cyst, left   . Proctalgia fugax     Tobacco Use: History  Smoking Status  . Never Smoker  Smokeless Tobacco  . Never Used    Comment: father smoked in house growing up per pt.     Labs: Recent Review Flowsheet Data    Labs for ITP Cardiac and Pulmonary Rehab Latest Ref Rng & Units 06/19/2008 01/16/2014 03/27/2014   Cholestrol 0 - 200 mg/dL - 223(H) -   LDLCALC 0 - 99 mg/dL - 150(H) -   HDL >39.00 mg/dL - 37.50(L) -   Trlycerides 0.0 - 149.0 mg/dL - 178.0(H) -   Hemoglobin A1c 4.6 - 6.5 % - - 5.4   TCO2 0 - 100 mmol/L 23 - -      Capillary  Blood Glucose: Lab Results  Component Value Date   GLUCAP 87 03/01/2015   GLUCAP 88 02/28/2015     ADL UCSD:   Pulmonary Function Assessment:     Pulmonary Function Assessment - 02/08/16 1238      Breath   Bilateral Breath Sounds Clear   Shortness of Breath Yes;Fear of Shortness of Breath;Limiting activity      Exercise Target Goals:    Exercise Program Goal: Individual exercise prescription set with THRR, safety & activity barriers. Participant demonstrates ability to understand and report RPE using BORG scale, to self-measure pulse accurately, and to acknowledge the importance of the exercise prescription.  Exercise Prescription Goal: Starting with aerobic activity 30 plus minutes a day, 3 days per week for initial exercise prescription. Provide home exercise prescription and guidelines that participant acknowledges understanding prior to discharge.  Activity Barriers & Risk Stratification:     Activity Barriers & Cardiac Risk Stratification - 02/08/16 1236      Activity Barriers & Cardiac Risk Stratification   Activity Barriers Deconditioning;Shortness of Breath      6 Minute Walk:     6 Minute Walk    Row Name 02/14/16 0711         6 Minute Walk   Phase Initial     Distance 1533 feet     Walk Time 6 minutes     # of Rest Breaks 0     MPH 2.9     METS 3.22     RPE 11     Perceived Dyspnea  1     Symptoms No     Resting HR 84 bpm     Resting BP 106/72     Max Ex. HR 117 bpm     Max Ex. BP 116/72     2 Minute Post BP 102/76       Interval HR   Baseline HR 84     1 Minute HR 107     2 Minute HR 110     3 Minute HR 111     4 Minute HR 117     5 Minute HR 116     6 Minute HR 115     2 Minute Post HR 88     Interval Heart Rate? Yes       Interval Oxygen   Interval Oxygen? Yes     Baseline Oxygen Saturation % 97 %     Baseline Liters of Oxygen 0 L     1 Minute Oxygen Saturation % 97 %     1 Minute Liters of Oxygen 0 L  2 Minute Oxygen  Saturation % 98 %     2 Minute Liters of Oxygen 0 L     3 Minute Oxygen Saturation % 98 %     3 Minute Liters of Oxygen 0 L     4 Minute Oxygen Saturation % 96 %     4 Minute Liters of Oxygen 0 L     5 Minute Oxygen Saturation % 97 %     5 Minute Liters of Oxygen 0 L     6 Minute Oxygen Saturation % 99 %     6 Minute Liters of Oxygen 0 L     2 Minute Post Oxygen Saturation % 98 %     2 Minute Post Liters of Oxygen 0 L        Initial Exercise Prescription:     Initial Exercise Prescription - 02/14/16 0700      Date of Initial Exercise RX and Referring Provider   Date 02/12/16   Referring Provider Dr. Chase Caller     Bike   Level 0.3   Minutes 17     NuStep   Level 2   Minutes 17   METs 1.5     Track   Laps 6   Minutes 17     Prescription Details   Frequency (times per week) 2   Duration Progress to 45 minutes of aerobic exercise without signs/symptoms of physical distress     Intensity   THRR 40-80% of Max Heartrate 71-142   Ratings of Perceived Exertion 11-13   Perceived Dyspnea 0-4     Progression   Progression Continue progressive overload as per policy without signs/symptoms or physical distress.     Resistance Training   Training Prescription Yes   Weight ORANGE BANDS   Reps 10-12      Perform Capillary Blood Glucose checks as needed.  Exercise Prescription Changes:     Exercise Prescription Changes    Row Name 02/19/16 1500 02/21/16 1500 03/04/16 1500 03/06/16 1500       Exercise Review   Progression  - Yes Yes Yes      Response to Exercise   Blood Pressure (Admit) 116/80 104/60 118/80 108/58    Blood Pressure (Exercise) 156/82 136/70 144/80 130/70    Blood Pressure (Exit) 100/62 114/74 104/72 118/62    Heart Rate (Admit) 80 bpm 89 bpm 91 bpm 99 bpm    Heart Rate (Exercise) 105 bpm 115 bpm 135 bpm 137 bpm    Heart Rate (Exit) 98 bpm 82 bpm 102 bpm 108 bpm    Oxygen Saturation (Admit) 98 % 97 % 98 % 99 %    Oxygen Saturation (Exercise) 97  % 97 % 96 % 96 %    Oxygen Saturation (Exit) 98 % 97 % 97 % 98 %    Rating of Perceived Exertion (Exercise) 15 13 13 13     Perceived Dyspnea (Exercise) 1 1 1 1     Duration Progress to 45 minutes of aerobic exercise without signs/symptoms of physical distress Progress to 45 minutes of aerobic exercise without signs/symptoms of physical distress Progress to 45 minutes of aerobic exercise without signs/symptoms of physical distress Progress to 45 minutes of aerobic exercise without signs/symptoms of physical distress    Intensity -  40-80% of THRR THRR unchanged THRR unchanged THRR unchanged      Progression   Progression Continue to progress workloads to maintain intensity without signs/symptoms of physical distress. Continue to progress workloads to maintain intensity  without signs/symptoms of physical distress. Continue to progress workloads to maintain intensity without signs/symptoms of physical distress. Continue to progress workloads to maintain intensity without signs/symptoms of physical distress.      Resistance Training   Training Prescription Yes Yes Yes Yes    Weight orange bands orange bands orange bands orange bands    Reps 10-12  10 minutes of strength training 10-12  10 minutes of strength training 10-12  10 minutes of strength training 10-12  10 minutes of strength training      Treadmill   MPH  - 2.3 2.9 3.1    Grade  - 1 3 6     Minutes  - 17 17 17       Bike   Level 0.5 0.7 0.7  -    Minutes 17 17 17   -      NuStep   Level 2  -  -  -    Minutes 17  -  -  -    METs 1.8  -  -  -      Rower   Level  -  - 2 2    Minutes  -  - 17 17      Track   Laps 18  -  -  -    Minutes 17  -  -  -       Exercise Comments:     Exercise Comments    Row Name 03/17/16 1149           Exercise Comments Patient has only attended 4 exercise sessions. Will cont. to monitor and progress.           Discharge Exercise Prescription (Final Exercise Prescription Changes):      Exercise Prescription Changes - 03/06/16 1500      Exercise Review   Progression Yes     Response to Exercise   Blood Pressure (Admit) 108/58   Blood Pressure (Exercise) 130/70   Blood Pressure (Exit) 118/62   Heart Rate (Admit) 99 bpm   Heart Rate (Exercise) 137 bpm   Heart Rate (Exit) 108 bpm   Oxygen Saturation (Admit) 99 %   Oxygen Saturation (Exercise) 96 %   Oxygen Saturation (Exit) 98 %   Rating of Perceived Exertion (Exercise) 13   Perceived Dyspnea (Exercise) 1   Duration Progress to 45 minutes of aerobic exercise without signs/symptoms of physical distress   Intensity THRR unchanged     Progression   Progression Continue to progress workloads to maintain intensity without signs/symptoms of physical distress.     Resistance Training   Training Prescription Yes   Weight orange bands   Reps 10-12  10 minutes of strength training     Treadmill   MPH 3.1   Grade 6   Minutes 17     Rower   Level 2   Minutes 17       Nutrition:  Target Goals: Understanding of nutrition guidelines, daily intake of sodium 1500mg , cholesterol 200mg , calories 30% from fat and 7% or less from saturated fats, daily to have 5 or more servings of fruits and vegetables.  Biometrics:    Nutrition Therapy Plan and Nutrition Goals:   Nutrition Discharge: Rate Your Plate Scores:   Psychosocial: Target Goals: Acknowledge presence or absence of depression, maximize coping skills, provide positive support system. Participant is able to verbalize types and ability to use techniques and skills needed for reducing stress and depression.  Initial Review & Psychosocial Screening:  Initial Psych Review & Screening - 02/08/16 [place]? Yes     Barriers   Psychosocial barriers to participate in program There are no identifiable barriers or psychosocial needs.     Screening Interventions   Interventions Encouraged to exercise       Quality of Life Scores:   PHQ-9: Recent Review Flowsheet Data    Depression screen [facility] 2/9 02/08/2016 03/18/2013   Decreased Interest 0 0   Down, Depressed, Hopeless 0 0   PHQ - 2 Score 0 0      Psychosocial Evaluation and Intervention:     Psychosocial Evaluation - 02/08/16 1240      Psychosocial Evaluation & Interventions   Interventions Encouraged to exercise with the program and follow exercise prescription      Psychosocial Re-Evaluation:     Psychosocial Re-Evaluation    Row Name 02/21/16 0842 03/17/16 0834 04/07/16 1409         Psychosocial Re-Evaluation   Interventions Encouraged to attend Pulmonary Rehabilitation for the exercise Encouraged to attend Pulmonary Rehabilitation for the exercise Encouraged to attend Pulmonary Rehabilitation for the exercise     Comments no psychosocial issues identified at this time no psychosocial issues identified at this time. Back and leg pain have kept her from exercising this past 30 days     Continued Psychosocial Services Needed No No No       Education: Education Goals: Education classes will be provided on a weekly basis, covering required topics. Participant will state understanding/return demonstration of topics presented.  Learning Barriers/Preferences:   Education Topics: Risk Factor Reduction:  -Group instruction that is supported by a PowerPoint presentation. Instructor discusses the definition of a risk factor, different risk factors for pulmonary disease, and how the heart and lungs work together.     Nutrition for Pulmonary Patient:  -Group instruction provided by PowerPoint slides, verbal discussion, and written materials to support subject matter. The instructor gives an explanation and review of healthy diet recommendations, which includes a discussion on weight management, recommendations for fruit and vegetable consumption, as well as protein, fluid, caffeine, fiber, sodium, sugar, and alcohol. Tips for  eating when patients are short of breath are discussed.   Pursed Lip Breathing:  -Group instruction that is supported by demonstration and informational handouts. Instructor discusses the benefits of pursed lip and diaphragmatic breathing and detailed demonstration on how to preform both.     Oxygen Safety:  -Group instruction provided by PowerPoint, verbal discussion, and written material to support subject matter. There is an overview of "What is Oxygen" and "Why do we need it".  Instructor also reviews how to create a safe environment for oxygen use, the importance of using oxygen as prescribed, and the risks of noncompliance. There is a brief discussion on traveling with oxygen and resources the patient may utilize.   Oxygen Equipment:  -Group instruction provided by [facility] Staff utilizing handouts, written materials, and equipment demonstrations.   Signs and Symptoms:  -Group instruction provided by written material and verbal discussion to support subject matter. Warning signs and symptoms of infection, stroke, and heart attack are reviewed and when to call the physician/911 reinforced. Tips for preventing the spread of infection discussed. Flowsheet Row PULMONARY REHAB OTHER RESPIRATORY from 03/06/2016 in [place]  Date  03/06/16  Educator  rn  Instruction Review Code  2- meets goals/outcomes      Advanced Directives:  -  Group instruction provided by verbal instruction and written material to support subject matter. Instructor reviews Advanced Directive laws and proper instruction for filling out document.   Pulmonary Video:  -Group video education that reviews the importance of medication and oxygen compliance, exercise, good nutrition, pulmonary hygiene, and pursed lip and diaphragmatic breathing for the pulmonary patient.   Exercise for the Pulmonary Patient:  -Group instruction that is supported by a PowerPoint presentation. Instructor  discusses benefits of exercise, core components of exercise, frequency, duration, and intensity of an exercise routine, importance of utilizing pulse oximetry during exercise, safety while exercising, and options of places to exercise outside of rehab.     Pulmonary Medications:  -Verbally interactive group education provided by instructor with focus on inhaled medications and proper administration.   Anatomy and Physiology of the Respiratory System and Intimacy:  -Group instruction provided by PowerPoint, verbal discussion, and written material to support subject matter. Instructor reviews respiratory cycle and anatomical components of the respiratory system and their functions. Instructor also reviews differences in obstructive and restrictive respiratory diseases with examples of each. Intimacy, Sex, and Sexuality differences are reviewed with a discussion on how relationships can change when diagnosed with pulmonary disease. Common sexual concerns are reviewed.   Knowledge Questionnaire Score:   Core Components/Risk Factors/Patient Goals at Admission:     Personal Goals and Risk Factors at Admission - 02/08/16 1240      Core Components/Risk Factors/Patient Goals on Admission    Weight Management Yes;Obesity   Intervention Weight Management: Develop a combined nutrition and exercise program designed to reach desired caloric intake, while maintaining appropriate intake of nutrient and fiber, sodium and fats, and appropriate energy expenditure required for the weight goal.   Expected Outcomes Short Term: Continue to assess and modify interventions until short term weight is achieved;Long Term: Adherence to nutrition and physical activity/exercise program aimed toward attainment of established weight goal;Weight Loss: Understanding of general recommendations for a balanced deficit meal plan, which promotes 1-2 lb weight loss per week and includes a negative energy balance of [phone]  kcal/d;Understanding recommendations for meals to include 15-35% energy as protein, 25-35% energy from fat, 35-60% energy from carbohydrates, less than 200mg  of dietary cholesterol, 20-35 gm of total fiber daily   Increase Strength and Stamina Yes   Intervention Provide advice, education, support and counseling about physical activity/exercise needs.;Develop an individualized exercise prescription for aerobic and resistive training based on initial evaluation findings, risk stratification, comorbidities and participant's personal goals.   Expected Outcomes Achievement of increased cardiorespiratory fitness and enhanced flexibility, muscular endurance and strength shown through measurements of functional capacity and personal statement of participant.   Improve shortness of breath with ADL's Yes   Intervention Provide education, individualized exercise plan and daily activity instruction to help decrease symptoms of SOB with activities of daily living.   Expected Outcomes Short Term: Achieves a reduction of symptoms when performing activities of daily living.   Develop more efficient breathing techniques such as purse lipped breathing and diaphragmatic breathing; and practicing self-pacing with activity Yes   Intervention Provide education, demonstration and support about specific breathing techniuqes utilized for more efficient breathing. Include techniques such as pursed lipped breathing, diaphragmatic breathing and self-pacing activity.   Expected Outcomes Short Term: Participant will be able to demonstrate and use breathing techniques as needed throughout daily activities.   Increase knowledge of respiratory medications and ability to use respiratory devices properly  Yes   Intervention Provide education and demonstration as needed of appropriate  use of medications, inhalers, and oxygen therapy.   Expected Outcomes Short Term: Achieves understanding of medications use. Understands that oxygen is a  medication prescribed by physician. Demonstrates appropriate use of inhaler and oxygen therapy.      Core Components/Risk Factors/Patient Goals Review:      Goals and Risk Factor Review    Row Name 02/21/16 0840 03/17/16 0830 04/07/16 1402         Core Components/Risk Factors/Patient Goals Review   Personal Goals Review Weight Management/Obesity;Increase knowledge of respiratory medications and ability to use respiratory devices properly.;Improve shortness of breath with ADL's;Develop more efficient breathing techniques such as purse lipped breathing and diaphragmatic breathing and practicing self-pacing with activity.;Increase Strength and Stamina  - Weight Management/Obesity;Increase knowledge of respiratory medications and ability to use respiratory devices properly.;Improve shortness of breath with ADL's;Develop more efficient breathing techniques such as purse lipped breathing and diaphragmatic breathing and practicing self-pacing with activity.;Increase Strength and Stamina     Review Has only attended 1 exercise session, too early to have any improvement Has only attended 4 exercise sessions due to illness on 2 different occasions,  beginning to increase workloads and is tolerating well, no weight loss as of yet. Has not attended any exercise sessions in the last 30 days d/t a back and leg pain after working on a rowing machine.  She will be discharged if this issue has not improved this week.     Expected Outcomes Should see an improvement in goals in the next 30 days Hopefully if attendance is regular she should see a marked improvement in strength, stamina, and shortness of breath, encourage weight loss. Back and leg issues will resolve and she may resume her pulmonary rehab.        Core Components/Risk Factors/Patient Goals at Discharge (Final Review):      Goals and Risk Factor Review - 04/07/16 1402      Core Components/Risk Factors/Patient Goals Review   Personal Goals Review  Weight Management/Obesity;Increase knowledge of respiratory medications and ability to use respiratory devices properly.;Improve shortness of breath with ADL's;Develop more efficient breathing techniques such as purse lipped breathing and diaphragmatic breathing and practicing self-pacing with activity.;Increase Strength and Stamina   Review Has not attended any exercise sessions in the last 30 days d/t a back and leg pain after working on a rowing machine.  She will be discharged if this issue has not improved this week.   Expected Outcomes Back and leg issues will resolve and she may resume her pulmonary rehab.      ITP Comments:   Comments: ITP REVIEW Pt has only attended 2 exercise sessions in the last 30 days, has had back and leg pain associated with exercise, she is being discharged from the program.

## 2016-04-16 DIAGNOSIS — G4733 Obstructive sleep apnea (adult) (pediatric): Secondary | ICD-10-CM | POA: Diagnosis not present

## 2016-04-17 ENCOUNTER — Encounter (HOSPITAL_COMMUNITY): Payer: BLUE CROSS/BLUE SHIELD

## 2016-04-17 DIAGNOSIS — M25511 Pain in right shoulder: Secondary | ICD-10-CM | POA: Diagnosis not present

## 2016-04-17 DIAGNOSIS — M542 Cervicalgia: Secondary | ICD-10-CM | POA: Diagnosis not present

## 2016-04-17 DIAGNOSIS — M25512 Pain in left shoulder: Secondary | ICD-10-CM | POA: Diagnosis not present

## 2016-04-17 DIAGNOSIS — M545 Low back pain: Secondary | ICD-10-CM | POA: Diagnosis not present

## 2016-04-22 ENCOUNTER — Encounter (HOSPITAL_COMMUNITY): Payer: BLUE CROSS/BLUE SHIELD

## 2016-04-24 ENCOUNTER — Encounter: Payer: Self-pay | Admitting: Neurology

## 2016-04-24 ENCOUNTER — Encounter (HOSPITAL_COMMUNITY): Payer: BLUE CROSS/BLUE SHIELD

## 2016-04-24 ENCOUNTER — Ambulatory Visit (HOSPITAL_BASED_OUTPATIENT_CLINIC_OR_DEPARTMENT_OTHER)
Admission: RE | Admit: 2016-04-24 | Discharge: 2016-04-24 | Disposition: A | Discharge status: Home / Self Care | Payer: BLUE CROSS/BLUE SHIELD | Source: Ambulatory Visit | Attending: Medical | Admitting: Medical

## 2016-04-24 ENCOUNTER — Other Ambulatory Visit (HOSPITAL_COMMUNITY)
Admission: RE | Admit: 2016-04-24 | Discharge: 2016-04-24 | Disposition: A | Discharge status: Home / Self Care | Payer: BLUE CROSS/BLUE SHIELD | Source: Ambulatory Visit | Attending: Medical | Admitting: Medical

## 2016-04-24 ENCOUNTER — Ambulatory Visit (INDEPENDENT_AMBULATORY_CARE_PROVIDER_SITE_OTHER): Payer: BLUE CROSS/BLUE SHIELD | Admitting: Medical

## 2016-04-24 ENCOUNTER — Other Ambulatory Visit: Payer: BLUE CROSS/BLUE SHIELD

## 2016-04-24 ENCOUNTER — Ambulatory Visit (INDEPENDENT_AMBULATORY_CARE_PROVIDER_SITE_OTHER): Payer: BLUE CROSS/BLUE SHIELD | Admitting: Neurology

## 2016-04-24 VITALS — BP 124/76 | HR 110 | Ht 64.0 in | Wt 215.0 lb

## 2016-04-24 VITALS — BP 112/80 | HR 88 | Temp 98.3°F | Wt 215.2 lb

## 2016-04-24 DIAGNOSIS — J309 Allergic rhinitis, unspecified: Secondary | ICD-10-CM

## 2016-04-24 DIAGNOSIS — G4486 Cervicogenic headache: Secondary | ICD-10-CM

## 2016-04-24 DIAGNOSIS — R51 Headache: Secondary | ICD-10-CM | POA: Diagnosis not present

## 2016-04-24 DIAGNOSIS — Z113 Encounter for screening for infections with a predominantly sexual mode of transmission: Secondary | ICD-10-CM | POA: Diagnosis not present

## 2016-04-24 DIAGNOSIS — R29898 Other symptoms and signs involving the musculoskeletal system: Secondary | ICD-10-CM

## 2016-04-24 DIAGNOSIS — R059 Cough, unspecified: Secondary | ICD-10-CM

## 2016-04-24 DIAGNOSIS — R350 Frequency of micturition: Secondary | ICD-10-CM

## 2016-04-24 DIAGNOSIS — R05 Cough: Secondary | ICD-10-CM | POA: Insufficient documentation

## 2016-04-24 DIAGNOSIS — R32 Unspecified urinary incontinence: Secondary | ICD-10-CM | POA: Diagnosis not present

## 2016-04-24 DIAGNOSIS — N3941 Urge incontinence: Secondary | ICD-10-CM

## 2016-04-24 LAB — POC URINALSYSI DIPSTICK (AUTOMATED)
Bilirubin, UA: NEGATIVE
GLUCOSE UA: NEGATIVE
Ketones, UA: NEGATIVE
Leukocytes, UA: NEGATIVE
NITRITE UA: NEGATIVE
Protein, UA: NEGATIVE
RBC UA: POSITIVE
Spec Grav, UA: >=1.03
UROBILINOGEN UA: 4
pH, UA: 5.5

## 2016-04-24 MED ORDER — LORATADINE 10 MG PO TABS: 10.0000 mg | ORAL_TABLET | Freq: Every day | ORAL | 0 refills | Status: DC

## 2016-04-24 MED ORDER — MONTELUKAST SODIUM 10 MG PO TABS: 10.0000 mg | ORAL_TABLET | Freq: Every day | ORAL | 0 refills | Status: DC

## 2016-04-24 MED ORDER — OXYBUTYNIN CHLORIDE ER 10 MG PO TB24: 10.0000 mg | ORAL_TABLET | Freq: Every day | ORAL | 0 refills | Status: DC

## 2016-04-24 NOTE — Progress Notes (Addendum)
NEUROLOGY FOLLOW UP OFFICE NOTE  [place] [id number]  HISTORY OF PRESENT ILLNESS: Lorraine Conrad is a 45 year old right-handed female with migraines, low back pain, proctalgia fugax, GERD, OSA on CPAP and history of viral meningitis whom I previously saw for back pain and lower extremity weakness  and numbness in extremities who follows up for recurrent episode of lower extremity pain and weakness, as well as headache.  UPDATE: To evaluate subjective weakness and numbness of extremities in 2016, she underwent NCV-EMG on 04/02/15, which was normal (no evidence of myopathy, neuropathy or radiculopathy).  She continues to have low back pain with burning in the low back.  About a couple of months ago, she finished a session at pulmonary rehab when she noted weakness and pain of her legs.  There was a burning sensation and aching of her muscles in her legs.  She received a course of prednisone which helped.  Her pulmonologist checked labs, which included include positive c-ANCA with titer of 1:40.  ANA negative, RF negative, ds DNA negative, cyclic citrullin peptide antibody negative, SSA and SSB antibodies negative.  She also reports episodes of urinary incontinence.  She has woken up with the bed soaked.  Sometimes, she is unable to make it to the bathroom when she has the urge.  She denies saddle anesthesia.  She denies bowel retention or incontinence.  She denies pain and weakness of the upper extremities.  She also reports left sided headache for the past year.  It is a constant pressure in her left temple and back of head that sometimes is severe.  She has associated left sided neck pain as well.  There is no associated visual disturbance, nausea, photophobia or phonophobia.  She rarely takes Tylenol.  Working diagnosis was temporal arteritis as she had a sed rate of 26.  She declined a temporal artery biopsy.  Repeat sed rate was 10.  However, it has improved since starting PT on the neck,  although she still has a dull constant ache, including in the back of the head.  She has been undergoing PT for the low back as well, which has helped with strength in the legs.  CT of head from 10/05/15 was personally reviewed and was negative.  HISTORY: On 02/27/15, she developed heaviness in the legs.  She denied urinary or bowel incontinence or retention, however she reported burning on urination.  She also reported burning sensation over the posterior head down into the shoulders.  She also had shortness of breath and intermittent mild mid-sternal chest pain radiating into left arm.  The next day, she noted that her left leg seemed weak.  She noted low back pain but no radicular pain down the legs.  Several days later, she developed numbness in face and arms.  She denied rash, sicca.   She underwent workup.  MRI with and without contrast demonstrated normal brain, cervical and thoracic spinal cord.  MRI of the lumbar spine showed minimal degenerative annular disc bulges at L4-5 and L5-S1 and mild bilateral facet arthrosis at L4-5, but no significant stenosis.  Labs included CRP 0.9, Sed Rate 23, negative Acetylcholine receptor antibodies, negative striated muscle antibodies, B12 236, negative heavy metals, B1 110.2, negative for HIV, normal CMP, and unremarkable CBC except for mildly elevated WBC of 12 which trended down to 10.  UA was negative.  Urine drug screen was positive for opioids.  Workup for chest pain and shortness of breath were negative for PE, including CTA  of chest, lower extremity venous doppler studies and d-dimer.  Neurological exam was normal, with intact deep tendon reflexes and without weakness.    She had a similar episode in 2010.  At that time, she was worked up for MS at [facility].  Work-up was unremarkable, and MS was not suspected at that time.   PAST MEDICAL HISTORY: Past Medical History:  Diagnosis Date  . Allergy    SEASONAL  . Anemia   . Arthritis    "spine"  (03/01/2014)  . Chronic lower back pain   . Endometriosis   . Erosive gastritis   . External hemorrhoids   . GERD (gastroesophageal reflux disease)   . H/O shortness of breath 2014   Cardiopulmonary exercise test results  . Headache    "probably monthly" (03/01/2014)  . Migraine 1998; 02/2014   "this one's lasted 10 days straight" (03/01/2014)  . OSA on CPAP   . Ovarian cyst, left   . Proctalgia fugax     MEDICATIONS: Current Outpatient Prescriptions on File Prior to Visit  Medication Sig Dispense Refill  . albuterol (PROVENTIL HFA;VENTOLIN HFA) 108 (90 BASE) MCG/ACT inhaler Inhale 2 puffs into the lungs every 6 (six) hours as needed for wheezing or shortness of breath. 1 Inhaler 3  . azelastine (ASTELIN) 0.1 % nasal spray Place 2 sprays into both nostrils 2 (two) times daily. Use in each nostril as directed 30 mL 3  . beclomethasone (QVAR) 40 MCG/ACT inhaler Inhale 2 puffs into the lungs 2 (two) times daily at 10 AM and 5 PM. 1 Inhaler 3  . conjugated estrogens (PREMARIN) vaginal cream Use one time per weekly at bedtime-one fourth applicator vaginally    . cyclobenzaprine (FLEXERIL) 10 MG tablet Take 10 mg by mouth.    . diclofenac (VOLTAREN) 75 MG EC tablet Take 1 tablet (75 mg total) by mouth 2 (two) times daily. 30 tablet 1  . doxycycline (VIBRA-TABS) 100 MG tablet Take 1 tablet (100 mg total) by mouth 2 (two) times daily. Caps or generic ok. 20 tablet 0  . fluconazole (DIFLUCAN) 150 MG tablet Take 150 mg by mouth every other day.    . fluticasone (FLONASE) 50 MCG/ACT nasal spray Place 2 sprays into both nostrils daily. 16 g 1  . guaiFENesin (MUCINEX) 600 MG 12 hr tablet Take 1,200 mg by mouth 2 (two) times daily.    . hydrOXYzine (ATARAX/VISTARIL) 10 MG tablet Take 1 tablet (10 mg total) by mouth 3 (three) times daily as needed for itching. 30 tablet 0  . lansoprazole (PREVACID) 30 MG capsule Take 30 mg by mouth daily at 12 noon.    . methocarbamol (ROBAXIN) 500 MG tablet Take 1  tablet (500 mg total) by mouth every 6 (six) hours as needed for muscle spasms. 20 tablet 0  . mometasone (NASONEX) 50 MCG/ACT nasal spray USE 2 SPRAYS IN EACH NOSTRIL EVERY DAY 17 g 1  . mupirocin ointment (BACTROBAN) 2 % Place 1 application into the nose 2 (two) times daily. 22 g 0  . nitroGLYCERIN (NITRODUR - DOSED IN MG/24 HR) 0.2 mg/hr patch Apply 1/4th patch to affected area of ankle, change daily 30 patch 1  . Probiotic Product (PROBIOTIC-10) CAPS Take by mouth.    . sertraline (ZOLOFT) 50 MG tablet Take 50 mg by mouth.     No current facility-administered medications on file prior to visit.     ALLERGIES: Allergies  Allergen Reactions  . Promethazine Hcl Other (See Comments)    Violent  tremors   . Cephalexin Diarrhea and Itching  . Codeine Other (See Comments)    Was a child when she took this.  . Dilaudid [Hydromorphone] Nausea And Vomiting  . Azithromycin Rash    Head to toe rash    FAMILY HISTORY: Family History  Problem Relation Age of Onset  . Breast cancer Maternal Grandmother   . Prostate cancer Paternal Grandfather   . Colon polyps Father   . Heart disease Father   . Colon cancer Neg Hx     SOCIAL HISTORY: Social History   Social History  . Marital status: Married    Spouse name: N/A  . Number of children: N/A  . Years of education: N/A   Occupational History  . Not on file.   Social History Main Topics  . Smoking status: Never Smoker  . Smokeless tobacco: Never Used     Comment: father smoked in house growing up per pt.   . Alcohol use 0.0 oz/week     Comment: 03/01/2014 "I'll have 1-2 drinks maybe 3-4 times/yr"  . Drug use: No  . Sexual activity: Yes    Birth control/ protection: Surgical   Other Topics Concern  . Not on file   Social History Narrative   Daily caffiene use: 6 cups per day   Patient does not get regular excercise    REVIEW OF SYSTEMS: Constitutional: No fevers, chills, or sweats, no generalized fatigue, change in  appetite Eyes: No visual changes, double vision, eye pain Ear, nose and throat: No hearing loss, ear pain, nasal congestion, sore throat Cardiovascular: No chest pain, palpitations Respiratory:  No shortness of breath at rest or with exertion, wheezes GastrointestinaI: No nausea, vomiting, diarrhea, abdominal pain, fecal incontinence Genitourinary:  Urinary incontinence Musculoskeletal:  Neck pain, back pain Integumentary: No rash, pruritus, skin lesions Neurological: as above Psychiatric: No depression, insomnia, anxiety Endocrine: No palpitations, fatigue, diaphoresis, mood swings, change in appetite, change in weight, increased thirst Hematologic/Lymphatic:  No purpura, petechiae. Allergic/Immunologic: no itchy/runny eyes, nasal congestion, recent allergic reactions, rashes  PHYSICAL EXAM: Vitals:   04/24/16 1110  BP: 124/76  Pulse: (!) 110   General: No acute distress.  Patient appears well-groomed.  normal body habitus. Head:  Normocephalic/atraumatic Eyes:  Fundi examined but not visualized Neck: supple, left suboccipital and paraspinal tenderness, full range of motion Heart:  Regular rate and rhythm Lungs:  Clear to auscultation bilaterally Back: paraspinal tenderness Neurological Exam: alert and oriented to person, place, and time. Attention span and concentration intact, recent and remote memory intact, fund of knowledge intact.  Speech fluent and not dysarthric, language intact.  CN II-XII intact. Bulk and tone normal, muscle strength 5/5 throughout.  Sensation to pinprick slightly decreased over right dorsum of foot, but otherwise pinprick and vibration sensation intact.  Deep tendon reflexes 2+ throughout.  Finger to nose testing intact.  Gait normal, Romberg negative.  IMPRESSION: 1.  Lower extremity pain and weakness.  This is a recurrent problem.  Previous workup has been negative.  She does have a disc bulges in the lower lumbar region, but not significant that would  explain symptoms.   2.  Headache.  Likely cervicogenic.  I do not think it is temporal arteritis.   3.  Urinary incontinence.  She does not exhibit symptoms to suggest etiology from the spinal cord or cauda equina (usually that would be accompanied by urinary retention and she does not exhibit upper motor neuron symptoms.  Imaging of neuroaxis from just last year  does not reveal any pathology either).  PLAN: 1.  We will check a CK and repeat NCV-EMG of the lower extremities to evaluate for myopathy. 2.  For headache, I suggested a preventative medication (antidepressant or anticonvulsant), but she wishes to hold off and continue PT, which seems to be helping.  Consider muscle relaxant for acute neck pain. 3.  Advised that she discuss urinary incontinence with her PCP. 4.  Further recommendations pending results.   If testing unremarkable, I again have no neurologic symptoms. If she wishes to initiate a headache preventative, she may contact me.   Metta Clines, DO  CC:  Mackie Pai, PA-C

## 2016-04-24 NOTE — Progress Notes (Signed)
Subjective:    Patient ID: Lorraine Conrad, female    DOB: Nov 11, 1970, 45 y.o.   MRN: [id number]  HPI  Pt in for recent urinary issues. Pt states when sneezing will leak just a little and when she laughs.  Pt state she will have some accidents/incontince. She states sometimes will be at night. States also sometimes when she get up at night tries to make it to bathroom and will urinate before she makes it to bathroom.   Pt states in the past she is not aware if she has had any dropped bladder history.  Pt neurologist did not think that her back bulging disk is the cause. History of mild disk bulge l4-l5.  Pt not reporting back pain today.  Pt in states some persistent cough. On past visit treated for allergies and sinus infection. She used flonase, astelin and doxycyline. Her cough is still present.    Review of Systems  Constitutional: Negative for chills, fatigue and fever.  Respiratory: Positive for cough. Negative for chest tightness, shortness of breath and wheezing.   Cardiovascular: Negative for chest pain and palpitations.  Gastrointestinal: Negative for abdominal distention and abdominal pain.  Genitourinary: Positive for frequency. Negative for urgency, vaginal bleeding and vaginal pain.       Incontinence.  Musculoskeletal: Negative for arthralgias, joint swelling, myalgias, neck pain and neck stiffness.       At times back pain but not presently. Reported.  Neurological: Negative for dizziness and headaches.  Hematological: Negative for adenopathy. Does not bruise/bleed easily.  Psychiatric/Behavioral: Negative for behavioral problems and confusion.    Past Medical History:  Diagnosis Date  . Allergy    SEASONAL  . Anemia   . Arthritis    "spine" (03/01/2014)  . Chronic lower back pain   . Endometriosis   . Erosive gastritis   . External hemorrhoids   . GERD (gastroesophageal reflux disease)   . H/O shortness of breath 2014   Cardiopulmonary exercise test  results  . Headache    "probably monthly" (03/01/2014)  . Migraine 1998; 02/2014   "this one's lasted 10 days straight" (03/01/2014)  . OSA on CPAP   . Ovarian cyst, left   . Proctalgia fugax      Social History   Social History  . Marital status: Married    Spouse name: N/A  . Number of children: N/A  . Years of education: N/A   Occupational History  . Not on file.   Social History Main Topics  . Smoking status: Never Smoker  . Smokeless tobacco: Never Used     Comment: father smoked in house growing up per pt.   . Alcohol use 0.0 oz/week     Comment: 03/01/2014 "I'll have 1-2 drinks maybe 3-4 times/yr"  . Drug use: No  . Sexual activity: Yes    Birth control/ protection: Surgical   Other Topics Concern  . Not on file   Social History Narrative   Daily caffiene use: 6 cups per day   Patient does not get regular excercise    Past Surgical History:  Procedure Laterality Date  . ABDOMINAL HYSTERECTOMY  04/2013  . BREAST BIOPSY Bilateral   . BREAST LUMPECTOMY Left ~ 1993  . CESAREAN SECTION  2002; 2006  . COLONOSCOPY    . LAPAROSCOPIC OVARIAN CYSTECTOMY  ~ 1999  . NASAL SEPTUM SURGERY  ~ 1991  . TEMPOROMANDIBULAR JOINT SURGERY  ~ 20  . UPPER GASTROINTESTINAL ENDOSCOPY      Family  History  Problem Relation Age of Onset  . Breast cancer Maternal Grandmother   . Prostate cancer Paternal Grandfather   . Colon polyps Father   . Heart disease Father   . Colon cancer Neg Hx     Allergies  Allergen Reactions  . Promethazine Hcl Other (See Comments)    Violent tremors   . Cephalexin Diarrhea and Itching  . Codeine Other (See Comments)    Was a child when she took this.  . Dilaudid [Hydromorphone] Nausea And Vomiting  . Azithromycin Rash    Head to toe rash    Current Outpatient Prescriptions on File Prior to Visit  Medication Sig Dispense Refill  . albuterol (PROVENTIL HFA;VENTOLIN HFA) 108 (90 BASE) MCG/ACT inhaler Inhale 2 puffs into the lungs every 6  (six) hours as needed for wheezing or shortness of breath. 1 Inhaler 3  . azelastine (ASTELIN) 0.1 % nasal spray Place 2 sprays into both nostrils 2 (two) times daily. Use in each nostril as directed 30 mL 3  . beclomethasone (QVAR) 40 MCG/ACT inhaler Inhale 2 puffs into the lungs 2 (two) times daily at 10 AM and 5 PM. 1 Inhaler 3  . conjugated estrogens (PREMARIN) vaginal cream Use one time per weekly at bedtime-one fourth applicator vaginally    . cyclobenzaprine (FLEXERIL) 10 MG tablet Take 10 mg by mouth.    . diclofenac (VOLTAREN) 75 MG EC tablet Take 1 tablet (75 mg total) by mouth 2 (two) times daily. 30 tablet 1  . fluticasone (FLONASE) 50 MCG/ACT nasal spray Place 2 sprays into both nostrils daily. 16 g 1  . guaiFENesin (MUCINEX) 600 MG 12 hr tablet Take 1,200 mg by mouth 2 (two) times daily.    . hydrOXYzine (ATARAX/VISTARIL) 10 MG tablet Take 1 tablet (10 mg total) by mouth 3 (three) times daily as needed for itching. 30 tablet 0  . lansoprazole (PREVACID) 30 MG capsule Take 30 mg by mouth daily at 12 noon.    . methocarbamol (ROBAXIN) 500 MG tablet Take 1 tablet (500 mg total) by mouth every 6 (six) hours as needed for muscle spasms. 20 tablet 0  . mometasone (NASONEX) 50 MCG/ACT nasal spray USE 2 SPRAYS IN EACH NOSTRIL EVERY DAY 17 g 1  . nitroGLYCERIN (NITRODUR - DOSED IN MG/24 HR) 0.2 mg/hr patch Apply 1/4th patch to affected area of ankle, change daily 30 patch 1  . Probiotic Product (PROBIOTIC-10) CAPS Take by mouth.    . doxycycline (VIBRA-TABS) 100 MG tablet Take 1 tablet (100 mg total) by mouth 2 (two) times daily. Caps or generic ok. (Patient not taking: Reported on 04/24/2016) 20 tablet 0  . fluconazole (DIFLUCAN) 150 MG tablet Take 150 mg by mouth every other day.    . mupirocin ointment (BACTROBAN) 2 % Place 1 application into the nose 2 (two) times daily. (Patient not taking: Reported on 04/24/2016) 22 g 0  . sertraline (ZOLOFT) 50 MG tablet Take 50 mg by mouth.     No  current facility-administered medications on file prior to visit.     BP 112/80 (BP Location: Left Arm, Patient Position: Sitting, Cuff Size: Normal)   Pulse 88   Temp 98.3 F (36.8 C) (Oral)   Wt 215 lb 3.2 oz (97.6 kg)   LMP 02/08/2013   SpO2 97%   BMI 36.94 kg/m       Objective:   Physical Exam  General  Mental Status - Alert. General Appearance - Well groomed. Not in acute distress.  Skin Rashes- No Rashes.  HEENT Head- Normal. Ear Auditory Canal - Left- Normal. Right - Normal.Tympanic Membrane- Left- Normal. Right- Normal. Eye Sclera/Conjunctiva- Left- Normal. Right- Normal. Nose & Sinuses Nasal Mucosa- Left-  Boggy and Congested. Right-  Boggy and  Congested.Bilateral no  maxillary and  No frontal sinus pressure. Mouth & Throat Lips: Upper Lip- Normal: no dryness, cracking, pallor, cyanosis, or vesicular eruption. Lower Lip-Normal: no dryness, cracking, pallor, cyanosis or vesicular eruption. Buccal Mucosa- Bilateral- No Aphthous ulcers. Oropharynx- No Discharge or Erythema. +pnd Tonsils: Characteristics- Bilateral- No Erythema or Congestion. Size/Enlargement- Bilateral- No enlargement. Discharge- bilateral-None.  Neck Neck- Supple. No Masses.   Chest and Lung Exam Auscultation: Breath Sounds:-Clear even and unlabored.  Cardiovascular Auscultation:Rythm- Regular, rate and rhythm. Murmurs & Other Heart Sounds:Ausculatation of the heart reveal- No Murmurs.  Lymphatic Head & Neck General Head & Neck Lymphatics: Bilateral: Description- No Localized lymphadenopathy.   Abdomen Inspection:-Inspection Normal.  Palpation/Perucssion: Palpation and Percussion of the abdomen reveal- faint suprapubic Tender, No Rebound tenderness, No rigidity(Guarding) and No Palpable abdominal masses.  Liver:-Normal.  Spleen:- Normal.     Back No Mid lumbar spine tenderness to palpation. Faint Pain on straight leg lift. Pain on lateral movements and flexion/extension of the  spine.  no cva tenderness.  Lower ext neurologic  L5-S1 sensation intact bilaterally. Normal patellar reflexes bilaterally. No foot drop bilaterally.         Assessment & Plan:  For your incontinence will rx ditropan and will go ahead and refer you to [facility].  For frequent urination will get urine culture and ancillary studies.   For cough will rx claritin and singulair. Continue you nasal sprays. Will treat allergies as cause of cough presently. Get cxr and use albuterol if severe dry cough at night or wheezing.  You incontinence does not appear related to your back. But scenario explained with back pain and incontince that would require emergent ED evaluation.  Follow up in 3 wks or as needed  Tericka Devincenzi, Percell Miller, [employer]

## 2016-04-24 NOTE — Patient Instructions (Addendum)
For your incontinence will rx ditropan and will go ahead and refer you to [facility].  For frequent urination will get urine culture and ancillary studies.   For cough will rx claritin and singulair. Continue you nasal sprays. Will treat allergies as caus of cough presently. Get cxr and use albuterol if severe dry cough at night or wheezing.  You incontinence does not appear related to your back. But scenario explained with back pain and incontince that would require emergent ED evaluation.  Follow up in 3 wks or as needed

## 2016-04-24 NOTE — Patient Instructions (Signed)
1. I think the headache is from the neck.  Continue physical therapy, which should help it.  If needed, we can start an antidepressant or anti-seizure medication to take daily to help prevent it. 2.  For leg weakness, we will check CK and repeat NCV-EMG of lower extremities. 3.  Discuss incontinence with Percell Miller.

## 2016-04-24 NOTE — Progress Notes (Signed)
Pre visit review using our clinic review tool, if applicable. No additional management support is needed unless otherwise documented below in the visit note. 

## 2016-04-25 ENCOUNTER — Ambulatory Visit: Payer: BLUE CROSS/BLUE SHIELD | Admitting: Medical

## 2016-04-25 LAB — URINALYSIS
Bilirubin Urine: NEGATIVE
Ketones, ur: NEGATIVE
Leukocytes, UA: NEGATIVE
NITRITE: NEGATIVE
SPECIFIC GRAVITY, URINE: 1.025 (ref 1.000–1.030)
Total Protein, Urine: NEGATIVE
URINE GLUCOSE: NEGATIVE
Urobilinogen, UA: 0.2 (ref 0.0–1.0)
pH: 6 (ref 5.0–8.0)

## 2016-04-25 LAB — URINE CULTURE

## 2016-04-25 LAB — CK: Total CK: 70 U/L (ref 7–177)

## 2016-04-28 ENCOUNTER — Ambulatory Visit: Payer: BLUE CROSS/BLUE SHIELD

## 2016-04-28 LAB — URINE CYTOLOGY ANCILLARY ONLY
CHLAMYDIA, DNA PROBE: NEGATIVE
NEISSERIA GONORRHEA: NEGATIVE
TRICH (WINDOWPATH): NEGATIVE

## 2016-04-29 ENCOUNTER — Encounter (HOSPITAL_COMMUNITY): Payer: BLUE CROSS/BLUE SHIELD

## 2016-04-29 ENCOUNTER — Ambulatory Visit (INDEPENDENT_AMBULATORY_CARE_PROVIDER_SITE_OTHER): Payer: BLUE CROSS/BLUE SHIELD | Admitting: Neurology

## 2016-04-29 ENCOUNTER — Telehealth: Payer: Self-pay | Admitting: Medical

## 2016-04-29 ENCOUNTER — Ambulatory Visit: Payer: BLUE CROSS/BLUE SHIELD

## 2016-04-29 ENCOUNTER — Telehealth: Payer: Self-pay

## 2016-04-29 DIAGNOSIS — R29898 Other symptoms and signs involving the musculoskeletal system: Secondary | ICD-10-CM | POA: Diagnosis not present

## 2016-04-29 DIAGNOSIS — R319 Hematuria, unspecified: Secondary | ICD-10-CM

## 2016-04-29 MED ORDER — CLINDAMYCIN HCL 150 MG PO CAPS: ORAL_CAPSULE | ORAL | 0 refills | Status: DC

## 2016-04-29 NOTE — Telephone Encounter (Signed)
-----   Message from Alda Berthold, DO sent at 04/29/2016 11:40 AM EST ----- Please inform patient that her nerve and muscle testing is normal.  Thanks.

## 2016-04-29 NOTE — Procedures (Signed)
[facility]  [address]  [place], [place] [address] Tel: [phone] Fax:  [phone] Test Date:  04/29/2016  Patient: Lorraine Conrad DOB: July 22, 1970 Physician: Narda Amber, DO  Sex: Female Height: 5\' 4"  Ref Phys: Metta Clines, M.D.  ID#: [id number] Temp: 33.0C Technician: Jerilynn Mages. Dean   Patient Complaints: This is a 45 year old female referred for evaluation of bilateral leg weakness, pain, numbness and tingling.  NCV & EMG Findings: Extensive electrodiagnostic testing of the right lower extremity and additional studies of the left shows:  1. Bilateral sural and superficial peroneal sensory responses are within normal limits. 2. Bilateral peroneal and tibial motor responses are within normal limits. 3. Bilateral tibial H reflex studies are within normal limits. 4. There is no evidence of active or chronic motor axon loss changes affecting any of the tested muscles. Motor unit configuration and recruitment pattern is within normal limits.  Impression: This is a normal study of the lower extremities.   In particular, there is no evidence of a lumbosacral radiculopathy, diffuse myopathy, or sensorimotor polyneuropathy.   ___________________________ Narda Amber, DO    Nerve Conduction Studies Anti Sensory Summary Table   Site NR Peak (ms) Norm Peak (ms) P-T Amp (V) Norm P-T Amp  Left Sup Peroneal Anti Sensory (Ant Lat Mall)  12 cm    2.1 <4.5 12.6 >5  Right Sup Peroneal Anti Sensory (Ant Lat Mall)  12 cm    3.1 <4.5 13.5 >5  Left Sural Anti Sensory (Lat Mall)  Calf    3.2 <4.5 12.7 >5  Right Sural Anti Sensory (Lat Mall)  Calf    3.1 <4.5 10.1 >5   Motor Summary Table   Site NR Onset (ms) Norm Onset (ms) O-P Amp (mV) Norm O-P Amp Site1 Site2 Delta-0 (ms) Dist (cm) Vel (m/s) Norm Vel (m/s)  Left Peroneal Motor (Ext Dig Brev)  Ankle    3.0 <5.5 5.7 >3 B Fib Ankle 6.0 32.0 53 >40  B Fib    9.0  4.8  Poplt B Fib 1.7 10.0 59 >40  Poplt    10.7  4.2          Right Peroneal Motor (Ext Dig Brev)  Ankle    2.4 <5.5 5.9 >3 B Fib Ankle 6.0 31.0 52 >40  B Fib    8.4  5.4  Poplt B Fib 1.7 10.0 59 >40  Poplt    10.1  5.4         Left Tibial Motor (Abd Hall Brev)  Ankle    4.1 <6.0 12.0 >8 Knee Ankle 6.5 37.0 57 >40  Knee    10.6  6.8         Right Tibial Motor (Abd Hall Brev)  Ankle    2.5 <6.0 12.7 >8 Knee Ankle 7.3 38.0 52 >40  Knee    9.8  8.9          H Reflex Studies   NR H-Lat (ms) Lat Norm (ms) L-R H-Lat (ms) M-Lat (ms) HLat-MLat (ms)  Left Tibial (Gastroc)     28.71 <35 0.00 4.22 24.49  Right Tibial (Gastroc)     28.71 <35 0.00 4.22 24.49   EMG   Side Muscle Ins Act Fibs Psw Fasc Number Recrt Dur Dur. Amp Amp. Poly Poly. Comment  Left AntTibialis Nml Nml Nml Nml Nml Nml Nml Nml Nml Nml Nml Nml N/A  Left Gastroc Nml Nml Nml Nml Nml Nml Nml Nml Nml Nml Nml Nml N/A  Left RectFemoris Nml Nml Nml Nml Nml Nml Nml Nml Nml Nml Nml Nml N/A  Left GluteusMed Nml Nml Nml Nml Nml Nml Nml Nml Nml Nml Nml Nml N/A  Left BicepsFemS Nml Nml Nml Nml Nml Nml Nml Nml Nml Nml Nml Nml N/A  Right BicepsFemS Nml Nml Nml Nml Nml Nml Nml Nml Nml Nml Nml Nml N/A  Right AntTibialis Nml Nml Nml Nml Nml Nml Nml Nml Nml Nml Nml Nml N/A  Right Gastroc Nml Nml Nml Nml Nml Nml Nml Nml Nml Nml Nml Nml N/A  Right RectFemoris Nml Nml Nml Nml Nml Nml Nml Nml Nml Nml Nml Nml N/A  Right GluteusMed Nml Nml Nml Nml Nml Nml Nml Nml Nml Nml Nml Nml N/A      Waveforms:

## 2016-04-29 NOTE — Telephone Encounter (Signed)
Please see note below. Percell Miller wanted to send this patient to Urology.

## 2016-04-29 NOTE — Telephone Encounter (Signed)
Please advise 

## 2016-04-29 NOTE — Telephone Encounter (Signed)
Message relayed to patient. Verbalized understanding and denied questions.   

## 2016-04-29 NOTE — Telephone Encounter (Signed)
Notify pt I rx'd clindamycin. Bacteria that she has in urine sensitive to amoxicillin typically but she has history of allergy to cephalosporins. So clindamycin likely the better choice.

## 2016-04-29 NOTE — Telephone Encounter (Signed)
Referral to urologist placed. Notify pt that [facility] [place] contact person on referral if no one calls her from specialist office.

## 2016-04-29 NOTE — Telephone Encounter (Signed)
Relation to [id number] Call back [phone]  Reason for call:  Patient wanted to inform you of the following information Dr. Jairo Ben urology [phone]

## 2016-04-30 ENCOUNTER — Other Ambulatory Visit: Payer: Self-pay | Admitting: Internal Medicine

## 2016-04-30 ENCOUNTER — Other Ambulatory Visit: Payer: BLUE CROSS/BLUE SHIELD

## 2016-04-30 DIAGNOSIS — R0602 Shortness of breath: Secondary | ICD-10-CM

## 2016-04-30 NOTE — Telephone Encounter (Signed)
Requesting to speak to assistant about antibiotic

## 2016-04-30 NOTE — Telephone Encounter (Signed)
Patient has some follow up questions regarding her antibiotics. Please advise

## 2016-04-30 NOTE — Telephone Encounter (Signed)
Patient would like a call about the Strep B that was found in her urine.

## 2016-05-01 ENCOUNTER — Encounter (HOSPITAL_COMMUNITY): Payer: BLUE CROSS/BLUE SHIELD

## 2016-05-02 LAB — MPO/PR-3 (ANCA) ANTIBODIES
Myeloperoxidase Abs: <1
Serine Protease 3: <1

## 2016-05-05 ENCOUNTER — Ambulatory Visit: Payer: BLUE CROSS/BLUE SHIELD

## 2016-05-05 LAB — URINE CYTOLOGY ANCILLARY ONLY
BACTERIAL VAGINITIS: NEGATIVE
CANDIDA VAGINITIS: NEGATIVE

## 2016-05-06 ENCOUNTER — Encounter (HOSPITAL_COMMUNITY): Payer: BLUE CROSS/BLUE SHIELD

## 2016-05-06 NOTE — Progress Notes (Signed)
Pt has seen results on MyChart and message also sent for patient to call back if any questions.

## 2016-05-08 ENCOUNTER — Encounter: Payer: Self-pay | Admitting: Internal Medicine

## 2016-05-08 ENCOUNTER — Encounter: Payer: Self-pay | Admitting: Medical

## 2016-05-08 ENCOUNTER — Encounter (HOSPITAL_COMMUNITY): Payer: BLUE CROSS/BLUE SHIELD

## 2016-05-08 NOTE — Telephone Encounter (Signed)
MR  Please advise on below email from Pt.   The results from the 11/22 acna The time before that my levels were a little high with the acna. Would it show more to test my blood work when Im having the symptom more? Is there anyway to know what that meant with my level being higher before? is there anything else to test? thanks, [employer]

## 2016-05-09 ENCOUNTER — Telehealth: Payer: Self-pay | Admitting: Internal Medicine

## 2016-05-09 DIAGNOSIS — Z6836 Body mass index (BMI) 36.0-36.9, adult: Secondary | ICD-10-CM | POA: Diagnosis not present

## 2016-05-09 DIAGNOSIS — R319 Hematuria, unspecified: Secondary | ICD-10-CM | POA: Diagnosis not present

## 2016-05-09 DIAGNOSIS — R8290 Unspecified abnormal findings in urine: Secondary | ICD-10-CM | POA: Diagnosis not present

## 2016-05-09 DIAGNOSIS — N3946 Mixed incontinence: Secondary | ICD-10-CM | POA: Diagnosis not present

## 2016-05-09 MED ORDER — MONTELUKAST SODIUM 10 MG PO TABS: 10.0000 mg | ORAL_TABLET | Freq: Every day | ORAL | 2 refills | Status: DC

## 2016-05-09 NOTE — Telephone Encounter (Signed)
lmtcb for pt.  

## 2016-05-09 NOTE — Telephone Encounter (Signed)
04/30/16 - anca mpo and pr3 came back negative this means that the first anca screen was false positive. So far no evidence of autoimmune disease and symptoms remain unexplained. Rest of plan oer 03/20/16 ov  Dr. Brand Males, M.D., F.C.C.P Pulmonary and Critical Care Medicine Staff Physician [place] [facility] Pager: [phone], If no answer or between  15:00h - 7:00h: call [phone]  05/09/2016 11:53 AM

## 2016-05-09 NOTE — Telephone Encounter (Signed)
Please advise on the MyChart message.

## 2016-05-12 ENCOUNTER — Ambulatory Visit: Payer: Self-pay | Admitting: Podiatry

## 2016-05-12 DIAGNOSIS — B351 Tinea unguium: Secondary | ICD-10-CM

## 2016-05-12 DIAGNOSIS — L603 Nail dystrophy: Secondary | ICD-10-CM

## 2016-05-12 NOTE — Telephone Encounter (Signed)
Called and spoke to pt. Informed her of the results and recs per MR. Pt verbalized understanding and denied any further questions or concerns at this time.   

## 2016-05-13 ENCOUNTER — Encounter (HOSPITAL_COMMUNITY): Payer: BLUE CROSS/BLUE SHIELD

## 2016-05-13 NOTE — Progress Notes (Signed)
She presents today for Laser therapy   Objective: Onychomycosis.  Assessment: Onychomycosis 5th nails bilateral  Plan: Laser therapy administered to bilateral 5th nails and other nails as prophylactic measurement. All safety y precautions were in place. Pt tolerated procedure well. Re-appointed to follow up in 1 month for 2nd Laser treatment. Treatment was started over due to delay in therapy, due to laser repairs. 4 of 6 laser treatments done so far.

## 2016-05-14 ENCOUNTER — Ambulatory Visit: Payer: BLUE CROSS/BLUE SHIELD | Admitting: Neurology

## 2016-05-14 DIAGNOSIS — M5440 Lumbago with sciatica, unspecified side: Secondary | ICD-10-CM | POA: Diagnosis not present

## 2016-05-14 DIAGNOSIS — Z6837 Body mass index (BMI) 37.0-37.9, adult: Secondary | ICD-10-CM | POA: Diagnosis not present

## 2016-05-14 DIAGNOSIS — N3281 Overactive bladder: Secondary | ICD-10-CM | POA: Diagnosis not present

## 2016-05-15 ENCOUNTER — Ambulatory Visit: Payer: BLUE CROSS/BLUE SHIELD | Admitting: Medical

## 2016-05-15 ENCOUNTER — Encounter (HOSPITAL_COMMUNITY): Payer: BLUE CROSS/BLUE SHIELD

## 2016-05-19 NOTE — Addendum Note (Signed)
Encounter addended by: Lance Morin, RN on: 05/19/2016 11:29 AM<BR>    Actions taken: Sign clinical note

## 2016-05-19 NOTE — Progress Notes (Signed)
Discharge Summary  Patient Details  Name: Lorraine Conrad MRN: [id number] Date of Birth: [birth year] Referring Provider:   April Manson [facility] Walk Test from 02/12/2016 in [place]  Referring Provider  Dr. Chase Caller       Number of Visits: 4  Reason for Discharge:  Early Exit:  Due to a back injury which has required her to go to physical therapy.  Smoking History:  History  Smoking Status  . Never Smoker  Smokeless Tobacco  . Never Used    Comment: father smoked in house growing up per pt.     Diagnosis:  Dyspnea  ADL UCSD:   Initial Exercise Prescription:     Initial Exercise Prescription - 02/14/16 0700      Date of Initial Exercise RX and Referring Provider   Date 02/12/16   Referring Provider Dr. Chase Caller     Bike   Level 0.3   Minutes 17     NuStep   Level 2   Minutes 17   METs 1.5     Track   Laps 6   Minutes 17     Prescription Details   Frequency (times per week) 2   Duration Progress to 45 minutes of aerobic exercise without signs/symptoms of physical distress     Intensity   THRR 40-80% of Max Heartrate 71-142   Ratings of Perceived Exertion 11-13   Perceived Dyspnea 0-4     Progression   Progression Continue progressive overload as per policy without signs/symptoms or physical distress.     Resistance Training   Training Prescription Yes   Weight ORANGE BANDS   Reps 10-12      Discharge Exercise Prescription (Final Exercise Prescription Changes):     Exercise Prescription Changes - 03/06/16 1500      Exercise Review   Progression Yes     Response to Exercise   Blood Pressure (Admit) 108/58   Blood Pressure (Exercise) 130/70   Blood Pressure (Exit) 118/62   Heart Rate (Admit) 99 bpm   Heart Rate (Exercise) 137 bpm   Heart Rate (Exit) 108 bpm   Oxygen Saturation (Admit) 99 %   Oxygen Saturation (Exercise) 96 %   Oxygen Saturation (Exit) 98 %   Rating of Perceived Exertion  (Exercise) 13   Perceived Dyspnea (Exercise) 1   Duration Progress to 45 minutes of aerobic exercise without signs/symptoms of physical distress   Intensity THRR unchanged     Progression   Progression Continue to progress workloads to maintain intensity without signs/symptoms of physical distress.     Resistance Training   Training Prescription Yes   Weight orange bands   Reps 10-12  10 minutes of strength training     Treadmill   MPH 3.1   Grade 6   Minutes 17     Rower   Level 2   Minutes 17      Functional Capacity:     6 Minute Walk    Row Name 02/14/16 0711         6 Minute Walk   Phase Initial     Distance 1533 feet     Walk Time 6 minutes     # of Rest Breaks 0     MPH 2.9     METS 3.22     RPE 11     Perceived Dyspnea  1     Symptoms No     Resting HR 84 bpm  Resting BP 106/72     Max Ex. HR 117 bpm     Max Ex. BP 116/72     2 Minute Post BP 102/76       Interval HR   Baseline HR 84     1 Minute HR 107     2 Minute HR 110     3 Minute HR 111     4 Minute HR 117     5 Minute HR 116     6 Minute HR 115     2 Minute Post HR 88     Interval Heart Rate? Yes       Interval Oxygen   Interval Oxygen? Yes     Baseline Oxygen Saturation % 97 %     Baseline Liters of Oxygen 0 L     1 Minute Oxygen Saturation % 97 %     1 Minute Liters of Oxygen 0 L     2 Minute Oxygen Saturation % 98 %     2 Minute Liters of Oxygen 0 L     3 Minute Oxygen Saturation % 98 %     3 Minute Liters of Oxygen 0 L     4 Minute Oxygen Saturation % 96 %     4 Minute Liters of Oxygen 0 L     5 Minute Oxygen Saturation % 97 %     5 Minute Liters of Oxygen 0 L     6 Minute Oxygen Saturation % 99 %     6 Minute Liters of Oxygen 0 L     2 Minute Post Oxygen Saturation % 98 %     2 Minute Post Liters of Oxygen 0 L        Psychological, QOL, Others - Outcomes: PHQ 2/9: Depression screen [facility] 2/9 02/08/2016 03/18/2013  Decreased Interest 0 0  Down, Depressed, Hopeless  0 0  PHQ - 2 Score 0 0  Some recent data might be hidden    Quality of Life:   Personal Goals: Goals established at orientation with interventions provided to work toward goal.     Personal Goals and Risk Factors at Admission - 02/08/16 1240      Core Components/Risk Factors/Patient Goals on Admission    Weight Management Yes;Obesity   Intervention Weight Management: Develop a combined nutrition and exercise program designed to reach desired caloric intake, while maintaining appropriate intake of nutrient and fiber, sodium and fats, and appropriate energy expenditure required for the weight goal.   Expected Outcomes Short Term: Continue to assess and modify interventions until short term weight is achieved;Long Term: Adherence to nutrition and physical activity/exercise program aimed toward attainment of established weight goal;Weight Loss: Understanding of general recommendations for a balanced deficit meal plan, which promotes 1-2 lb weight loss per week and includes a negative energy balance of [id number] kcal/d;Understanding recommendations for meals to include 15-35% energy as protein, 25-35% energy from fat, 35-60% energy from carbohydrates, less than 200mg  of dietary cholesterol, 20-35 gm of total fiber daily   Increase Strength and Stamina Yes   Intervention Provide advice, education, support and counseling about physical activity/exercise needs.;Develop an individualized exercise prescription for aerobic and resistive training based on initial evaluation findings, risk stratification, comorbidities and participant's personal goals.   Expected Outcomes Achievement of increased cardiorespiratory fitness and enhanced flexibility, muscular endurance and strength shown through measurements of functional capacity and personal statement of participant.   Improve shortness of breath with ADL's Yes  Intervention Provide education, individualized exercise plan and daily activity instruction to  help decrease symptoms of SOB with activities of daily living.   Expected Outcomes Short Term: Achieves a reduction of symptoms when performing activities of daily living.   Develop more efficient breathing techniques such as purse lipped breathing and diaphragmatic breathing; and practicing self-pacing with activity Yes   Intervention Provide education, demonstration and support about specific breathing techniuqes utilized for more efficient breathing. Include techniques such as pursed lipped breathing, diaphragmatic breathing and self-pacing activity.   Expected Outcomes Short Term: Participant will be able to demonstrate and use breathing techniques as needed throughout daily activities.   Increase knowledge of respiratory medications and ability to use respiratory devices properly  Yes   Intervention Provide education and demonstration as needed of appropriate use of medications, inhalers, and oxygen therapy.   Expected Outcomes Short Term: Achieves understanding of medications use. Understands that oxygen is a medication prescribed by physician. Demonstrates appropriate use of inhaler and oxygen therapy.       Personal Goals Discharge:     Goals and Risk Factor Review    Row Name 02/21/16 0840 03/17/16 0830 04/07/16 1402         Core Components/Risk Factors/Patient Goals Review   Personal Goals Review Weight Management/Obesity;Increase knowledge of respiratory medications and ability to use respiratory devices properly.;Improve shortness of breath with ADL's;Develop more efficient breathing techniques such as purse lipped breathing and diaphragmatic breathing and practicing self-pacing with activity.;Increase Strength and Stamina  - Weight Management/Obesity;Increase knowledge of respiratory medications and ability to use respiratory devices properly.;Improve shortness of breath with ADL's;Develop more efficient breathing techniques such as purse lipped breathing and diaphragmatic breathing  and practicing self-pacing with activity.;Increase Strength and Stamina     Review Has only attended 1 exercise session, too early to have any improvement Has only attended 4 exercise sessions due to illness on 2 different occasions,  beginning to increase workloads and is tolerating well, no weight loss as of yet. Has not attended any exercise sessions in the last 30 days d/t a back and leg pain after working on a rowing machine.  She will be discharged if this issue has not improved this week.     Expected Outcomes Should see an improvement in goals in the next 30 days Hopefully if attendance is regular she should see a marked improvement in strength, stamina, and shortness of breath, encourage weight loss. Back and leg issues will resolve and she may resume her pulmonary rehab.        Nutrition & Weight - Outcomes:    Nutrition:   Nutrition Discharge:   Education Questionnaire Score:   Goals reviewed with patient; copy given to patient.

## 2016-05-20 ENCOUNTER — Encounter (HOSPITAL_COMMUNITY): Payer: BLUE CROSS/BLUE SHIELD

## 2016-05-22 ENCOUNTER — Encounter (HOSPITAL_COMMUNITY): Payer: BLUE CROSS/BLUE SHIELD

## 2016-05-25 NOTE — Progress Notes (Signed)
   Subjective:    Patient ID: Lorraine Conrad, female    DOB: 12-10-1970, 45 y.o.   MRN: [id number]  HPI    deleted precharting since she never came in. No charge.     Review of Systems        Objective:   Physical Exam         Assessment & Plan:

## 2016-05-27 ENCOUNTER — Ambulatory Visit (INDEPENDENT_AMBULATORY_CARE_PROVIDER_SITE_OTHER): Payer: BLUE CROSS/BLUE SHIELD | Admitting: Medical

## 2016-05-27 ENCOUNTER — Encounter (HOSPITAL_COMMUNITY): Payer: BLUE CROSS/BLUE SHIELD

## 2016-05-27 DIAGNOSIS — Z5329 Procedure and treatment not carried out because of patient's decision for other reasons: Secondary | ICD-10-CM

## 2016-05-29 ENCOUNTER — Encounter (HOSPITAL_COMMUNITY): Payer: BLUE CROSS/BLUE SHIELD

## 2016-06-03 ENCOUNTER — Encounter (HOSPITAL_COMMUNITY): Payer: BLUE CROSS/BLUE SHIELD

## 2016-06-05 ENCOUNTER — Encounter (HOSPITAL_COMMUNITY): Payer: BLUE CROSS/BLUE SHIELD

## 2016-06-09 ENCOUNTER — Telehealth: Payer: Self-pay | Admitting: Medical

## 2016-06-09 NOTE — Telephone Encounter (Signed)
Pt was a no show on last visit/follow up. Will you offer you to come before 06-28-2015.    But offer her what ever is more convenient for her.

## 2016-06-11 ENCOUNTER — Other Ambulatory Visit: Payer: BLUE CROSS/BLUE SHIELD

## 2016-06-11 NOTE — Telephone Encounter (Signed)
Voicemail is not setup, will attempt to try at another time

## 2016-06-11 NOTE — Telephone Encounter (Signed)
Patient stated she has not started the  montelukast (SINGULAIR) 10 MG tablet and will call back to schedule follow up.

## 2016-06-12 ENCOUNTER — Ambulatory Visit: Payer: BLUE CROSS/BLUE SHIELD | Admitting: Medical

## 2016-06-13 ENCOUNTER — Telehealth: Payer: Self-pay | Admitting: Medical

## 2016-06-13 NOTE — Telephone Encounter (Signed)
Pt has an appt with Percell Miller on Monday 06/16/16 at 1 pm.  Message routed to PCP for FYI.

## 2016-06-13 NOTE — Telephone Encounter (Signed)
Rudene Anda, RN    06/13/16 1:39 PM  Note    Pt has an appt with Percell Miller on Monday 06/16/16 at 1 pm.  Message routed to PCP for FYI.         06/13/16 1:30 PM  Lavonne Chick routed this conversation to [facility] On Call Pool  Lavonne Chick     06/13/16 1:30 PM  Note    [facility] Client [place] Patient Name: Lorraine Conrad DOB: [birth year] Initial Comment Caller states she has head pressure. Nurse Assessment Nurse: Dimas Chyle, RN, Dellis Filbert Date/Time Eilene Ghazi Time): 06/13/2016 1:17:24 PM Confirm and document reason for call. If symptomatic, describe symptoms. ---Caller states she has head pressure. Symptoms since last January. Symptoms have gotten worse over this past week. Does the patient have any new or worsening symptoms? ---Yes Will a triage be completed? ---Yes Related visit to physician within the last 2 weeks? ---No Does the PT have any chronic conditions? (i.e. diabetes, asthma, etc.) ---No Is the patient pregnant or possibly pregnant? (Ask all females between the ages of 11-55) ---No Is this a behavioral health or substance abuse call? ---No Guidelines Guideline Title Affirmed Question Affirmed Notes Headache Headache is a chronic symptom (recurrent or ongoing AND present > 4 weeks) Final Disposition User See PCP within 2 Unk Pinto, RN, Dellis Filbert Disagree/Comply: Comply

## 2016-06-13 NOTE — Telephone Encounter (Signed)
Patient called with head pain and vomiting. She sounded very groggy and slow on the phone. Sent to [facility] to be evaluated.

## 2016-06-13 NOTE — Telephone Encounter (Signed)
[facility] - Client TELEPHONE ADVICE RECORD [facility] Patient Name: Lorraine Conrad DOB: 1971/01/18 Initial Comment Caller states she has head pressure. Nurse Assessment Nurse: Dimas Chyle, RN, Dellis Filbert Date/Time Eilene Ghazi Time): 06/13/2016 1:17:24 PM Confirm and document reason for call. If symptomatic, describe symptoms. ---Caller states she has head pressure. Symptoms since last January. Symptoms have gotten worse over this past week. Does the patient have any new or worsening symptoms? ---Yes Will a triage be completed? ---Yes Related visit to physician within the last 2 weeks? ---No Does the PT have any chronic conditions? (i.e. diabetes, asthma, etc.) ---No Is the patient pregnant or possibly pregnant? (Ask all females between the ages of 62-55) ---No Is this a behavioral health or substance abuse call? ---No Guidelines Guideline Title Affirmed Question Affirmed Notes Headache Headache is a chronic symptom (recurrent or ongoing AND present > 4 weeks) Final Disposition User See PCP within 2 Unk Pinto, RN, Dellis Filbert Disagree/Comply: Comply

## 2016-06-16 ENCOUNTER — Ambulatory Visit: Payer: Self-pay | Admitting: Medical

## 2016-06-19 ENCOUNTER — Ambulatory Visit (INDEPENDENT_AMBULATORY_CARE_PROVIDER_SITE_OTHER): Payer: BLUE CROSS/BLUE SHIELD | Admitting: Medical

## 2016-06-19 VITALS — BP 124/76 | HR 82 | Temp 97.8°F | Ht 64.0 in | Wt 221.2 lb

## 2016-06-19 DIAGNOSIS — M5440 Lumbago with sciatica, unspecified side: Secondary | ICD-10-CM

## 2016-06-19 DIAGNOSIS — R519 Headache, unspecified: Secondary | ICD-10-CM

## 2016-06-19 DIAGNOSIS — R51 Headache: Secondary | ICD-10-CM | POA: Diagnosis not present

## 2016-06-19 DIAGNOSIS — R002 Palpitations: Secondary | ICD-10-CM

## 2016-06-19 LAB — SEDIMENTATION RATE: Sed Rate: 26 mm/hr — ABNORMAL HIGH (ref 0–20)

## 2016-06-19 MED ORDER — PREDNISONE 20 MG PO TABS: ORAL_TABLET | ORAL | 0 refills | Status: DC

## 2016-06-19 MED ORDER — KETOROLAC TROMETHAMINE 30 MG/ML IJ SOLN
30.0000 mg | Freq: Once | INTRAMUSCULAR | Status: AC
  Administered 2016-06-19: 30 mg via INTRAMUSCULAR

## 2016-06-19 NOTE — Progress Notes (Signed)
Pre visit review using our clinic review tool, if applicable. No additional management support is needed unless otherwise documented below in the visit note. 

## 2016-06-19 NOTE — Patient Instructions (Signed)
For your recurrent left side temporal area pain will get sed rate today. Will call you with results later today. Since you have neurologist appointment tomorrow and referred by your gyn for temporal area pain, I have hopes that they might be able to do temporal artery biopsy.  For your temporal region pain and low back pain will give toradol 30 mg im. Hold any nsaid otc or rx presenlty until you see neurologist tomorrow and they decide further treatment.  For your reported fast heart rate one week ago I want you to update [place] if you have any further episodes. Currently on exam normal rhythm and rate. If you get any recurrent significant heart symptoms then ED evaluation. If you do feel any further fast heart rate episodes or palpitation then will refer for holter monitor.  Let me know  If you back pain is again getting worse or daily with radiating pain in legs or weakness daily. In that event would refer you to neurosurgeon.  Follow up in 3 weeks or as needed

## 2016-06-19 NOTE — Progress Notes (Signed)
Subjective:    Patient ID: Lorraine Conrad, female    DOB: 10-28-1970, 46 y.o.   MRN: [id number]  HPI  Pt in with some recurrent left side temporal area pain last week that last for about one day. Intense pain for about 1-2 hours. She vomited and then ha/temporal area decreased. But afterwards she had faint mild HA. Next day she felt a lot better. Pt had a negative CT in the past and I had referred her to specialist who offered a temporal artery biopsy but she never got test done. Pt in early  October had elevated sed rate. Last time checked was normal sed rate. Only mild dull temporal pain presently.   Also hx of intermittent low back pain. Pain yesterday 4/5. Pain at times radiates to left buttox. Sometimes when back hurts her toes feel cold and numbness to both feet. Pt states history of back pain 8-9 trs. 02-19-2015 mri done. That report showed l4-5 and l5-s1 disc bulge. Pt neurologist  recentstudies showed normal  ncv and emg lower ext.  Few years since last saw Chief of Staff.  Pt has transient sensation of heart race one week ago at night in her sleep. Then 20 minutes later this resolved. Pt stress test looked ok in the past. Also had echocardiogram done in past. Only one event last week with no reoccurence.    Review of Systems  Constitutional: Negative for chills, fatigue and fever.  Respiratory: Negative for cough, chest tightness and wheezing.   Cardiovascular: Negative for chest pain and palpitations.  Gastrointestinal: Negative for abdominal distention, abdominal pain, blood in stool, diarrhea and nausea.  Musculoskeletal: Positive for back pain. Negative for arthralgias, gait problem, joint swelling, myalgias, neck pain and neck stiffness.  Skin: Negative for rash.  Neurological: Negative for dizziness, speech difficulty, weakness, numbness and headaches.       Left temporal pain last week and in past. See hpi.  Hematological: Negative for adenopathy. Does not bruise/bleed  easily.  Psychiatric/Behavioral: Negative for behavioral problems and confusion.    Past Medical History:  Diagnosis Date  . Allergy    SEASONAL  . Anemia   . Arthritis    "spine" (03/01/2014)  . Chronic lower back pain   . Endometriosis   . Erosive gastritis   . External hemorrhoids   . GERD (gastroesophageal reflux disease)   . H/O shortness of breath 2014   Cardiopulmonary exercise test results  . Headache    "probably monthly" (03/01/2014)  . Migraine 1998; 02/2014   "this one's lasted 10 days straight" (03/01/2014)  . OSA on CPAP   . Ovarian cyst, left   . Proctalgia fugax      Social History   Social History  . Marital status: Married    Spouse name: N/A  . Number of children: N/A  . Years of education: N/A   Occupational History  . Not on file.   Social History Main Topics  . Smoking status: Never Smoker  . Smokeless tobacco: Never Used     Comment: father smoked in house growing up per pt.   . Alcohol use 0.0 oz/week     Comment: 03/01/2014 "I'll have 1-2 drinks maybe 3-4 times/yr"  . Drug use: No  . Sexual activity: Yes    Birth control/ protection: Surgical   Other Topics Concern  . Not on file   Social History Narrative   Daily caffiene use: 6 cups per day   Patient does not get regular excercise  Past Surgical History:  Procedure Laterality Date  . ABDOMINAL HYSTERECTOMY  04/2013  . BREAST BIOPSY Bilateral   . BREAST LUMPECTOMY Left ~ 1993  . CESAREAN SECTION  2002; 2006  . COLONOSCOPY    . LAPAROSCOPIC OVARIAN CYSTECTOMY  ~ 1999  . NASAL SEPTUM SURGERY  ~ 1991  . TEMPOROMANDIBULAR JOINT SURGERY  ~ 29  . UPPER GASTROINTESTINAL ENDOSCOPY      Family History  Problem Relation Age of Onset  . Breast cancer Maternal Grandmother   . Prostate cancer Paternal Grandfather   . Colon polyps Father   . Heart disease Father   . Colon cancer Neg Hx     Allergies  Allergen Reactions  . Promethazine Hcl Other (See Comments)    Violent  tremors   . Cephalexin Diarrhea and Itching  . Codeine Other (See Comments)    Was a child when she took this.  . Dilaudid [Hydromorphone] Nausea And Vomiting  . Azithromycin Rash    Head to toe rash    Current Outpatient Prescriptions on File Prior to Visit  Medication Sig Dispense Refill  . albuterol (PROVENTIL HFA;VENTOLIN HFA) 108 (90 BASE) MCG/ACT inhaler Inhale 2 puffs into the lungs every 6 (six) hours as needed for wheezing or shortness of breath. 1 Inhaler 3  . azelastine (ASTELIN) 0.1 % nasal spray Place 2 sprays into both nostrils 2 (two) times daily. Use in each nostril as directed 30 mL 3  . beclomethasone (QVAR) 40 MCG/ACT inhaler Inhale 2 puffs into the lungs 2 (two) times daily at 10 AM and 5 PM. 1 Inhaler 3  . conjugated estrogens (PREMARIN) vaginal cream Use one time per weekly at bedtime-one fourth applicator vaginally    . cyclobenzaprine (FLEXERIL) 10 MG tablet Take 10 mg by mouth.    . diclofenac (VOLTAREN) 75 MG EC tablet Take 1 tablet (75 mg total) by mouth 2 (two) times daily. 30 tablet 1  . doxycycline (VIBRA-TABS) 100 MG tablet Take 1 tablet (100 mg total) by mouth 2 (two) times daily. Caps or generic ok. 20 tablet 0  . fluticasone (FLONASE) 50 MCG/ACT nasal spray Place 2 sprays into both nostrils daily. 16 g 1  . guaiFENesin (MUCINEX) 600 MG 12 hr tablet Take 1,200 mg by mouth 2 (two) times daily.    . hydrOXYzine (ATARAX/VISTARIL) 10 MG tablet Take 1 tablet (10 mg total) by mouth 3 (three) times daily as needed for itching. 30 tablet 0  . lansoprazole (PREVACID) 30 MG capsule Take 30 mg by mouth daily at 12 noon.    . loratadine (CLARITIN) 10 MG tablet Take 1 tablet (10 mg total) by mouth daily. 30 tablet 0  . methocarbamol (ROBAXIN) 500 MG tablet Take 1 tablet (500 mg total) by mouth every 6 (six) hours as needed for muscle spasms. 20 tablet 0  . mometasone (NASONEX) 50 MCG/ACT nasal spray USE 2 SPRAYS IN EACH NOSTRIL EVERY DAY 17 g 1  . montelukast (SINGULAIR)  10 MG tablet Take 1 tablet (10 mg total) by mouth at bedtime. 30 tablet 2  . mupirocin ointment (BACTROBAN) 2 % Place 1 application into the nose 2 (two) times daily. 22 g 0  . nitroGLYCERIN (NITRODUR - DOSED IN MG/24 HR) 0.2 mg/hr patch Apply 1/4th patch to affected area of ankle, change daily 30 patch 1  . oxybutynin (DITROPAN-XL) 10 MG 24 hr tablet Take 1 tablet (10 mg total) by mouth at bedtime. 30 tablet 0  . Probiotic Product (PROBIOTIC-10) CAPS Take by  mouth.    . sertraline (ZOLOFT) 50 MG tablet Take 50 mg by mouth.    . fluconazole (DIFLUCAN) 150 MG tablet Take 150 mg by mouth every other day.     No current facility-administered medications on file prior to visit.     BP 124/76 (BP Location: Right Arm, Patient Position: Sitting, Cuff Size: Large)   Pulse 82   Temp 97.8 F (36.6 C) (Oral)   Ht 5\' 4"  (1.626 m)   Wt 221 lb 3.2 oz (100.3 kg)   LMP 02/08/2013   SpO2 99% Comment: RA  BMI 37.97 kg/m       Objective:   Physical Exam   General Mental Status- Alert. General Appearance- Not in acute distress.   Skin General: Color- Normal Color. Moisture- Normal Moisture.  Heent- negative but on palpation left temporal area mild tender. No dilated vessels.  Neck Carotid Arteries- Normal color. Moisture- Normal Moisture. No carotid bruits. No JVD.  Chest and Lung Exam Auscultation: Breath Sounds:-Normal.  Cardiovascular Auscultation:Rythm- Regular. Murmurs & Other Heart Sounds:Auscultation of the heart reveals- No Murmurs.  Abdomen Inspection:-Inspeection Normal. Palpation/Percussion:Note:No mass. Palpation and Percussion of the abdomen reveal- Non Tender, Non Distended + BS, no rebound or guarding.   Back Midm faint  lumbar spine tenderness to palpation. Pain on straight leg lift.faint Pain on lateral movements and flexion/extension of the spine.  Lower ext neurologic  L5-S1 sensation intact bilaterally. Normal patellar reflexes bilaterally. No foot drop  bilaterally..   Neurologic Cranial Nerve exam:- CN III-XII intact(No nystagmus), symmetric smile. Drift Test:- No drift. Finger to Nose:- Normal/Intact Strength:- 5/5 equal and symmetric strength both upper and lower extremities.     Assessment & Plan:  For your recurrent left side temporal area pain will get sed rate today. Will call you with results later today. Since you have neurologist appointment tomorrow and referred by your gyn for temporal area pain, I have hopes that they might be able to do temporal artery biopsy.  For your temporal region pain and low back pain will give toradol 30 mg im. Hold any nsaid otc or rx presenlty until you see neurologist tomorrow and they decide further treatment.  For your reported fast heart rate one week ago I want you to update [place] if you have any further episodes. Currently on exam normal rhythm and rate. If you get any recurrent significant heart symptoms then ED evaluation. If you do feel any further fast heart rate episodes or palpitation then will refer for holter monitor.  Let me know  If you back pain is again getting worse or daily with radiating pain in legs or weakness daily. In that event would refer you to neurosurgeon.  Follow up in 3 weeks or as needed  Lorraine Conrad, Percell Miller, [employer]

## 2016-06-19 NOTE — Telephone Encounter (Signed)
rx prednisone sent in to pharmacy

## 2016-06-20 DIAGNOSIS — R51 Headache: Secondary | ICD-10-CM | POA: Diagnosis not present

## 2016-06-20 DIAGNOSIS — R899 Unspecified abnormal finding in specimens from other organs, systems and tissues: Secondary | ICD-10-CM | POA: Diagnosis not present

## 2016-06-20 DIAGNOSIS — R42 Dizziness and giddiness: Secondary | ICD-10-CM | POA: Diagnosis not present

## 2016-06-20 DIAGNOSIS — R208 Other disturbances of skin sensation: Secondary | ICD-10-CM | POA: Diagnosis not present

## 2016-06-20 DIAGNOSIS — M545 Low back pain: Secondary | ICD-10-CM | POA: Diagnosis not present

## 2016-06-26 ENCOUNTER — Ambulatory Visit: Payer: BLUE CROSS/BLUE SHIELD | Admitting: Neurology

## 2016-07-08 DIAGNOSIS — R42 Dizziness and giddiness: Secondary | ICD-10-CM | POA: Diagnosis not present

## 2016-07-08 DIAGNOSIS — R51 Headache: Secondary | ICD-10-CM | POA: Diagnosis not present

## 2016-07-10 ENCOUNTER — Ambulatory Visit (INDEPENDENT_AMBULATORY_CARE_PROVIDER_SITE_OTHER): Payer: BLUE CROSS/BLUE SHIELD | Admitting: Medical

## 2016-07-10 ENCOUNTER — Ambulatory Visit (HOSPITAL_BASED_OUTPATIENT_CLINIC_OR_DEPARTMENT_OTHER)
Admission: RE | Admit: 2016-07-10 | Discharge: 2016-07-10 | Disposition: A | Discharge status: Home / Self Care | Payer: BLUE CROSS/BLUE SHIELD | Source: Ambulatory Visit | Attending: Medical | Admitting: Medical

## 2016-07-10 ENCOUNTER — Telehealth: Payer: Self-pay | Admitting: Medical

## 2016-07-10 VITALS — BP 119/83 | HR 97 | Temp 98.2°F | Ht 64.0 in | Wt 220.4 lb

## 2016-07-10 DIAGNOSIS — R5383 Other fatigue: Secondary | ICD-10-CM

## 2016-07-10 DIAGNOSIS — E049 Nontoxic goiter, unspecified: Secondary | ICD-10-CM | POA: Diagnosis not present

## 2016-07-10 DIAGNOSIS — R109 Unspecified abdominal pain: Secondary | ICD-10-CM

## 2016-07-10 DIAGNOSIS — R519 Headache, unspecified: Secondary | ICD-10-CM

## 2016-07-10 DIAGNOSIS — R51 Headache: Secondary | ICD-10-CM | POA: Diagnosis not present

## 2016-07-10 DIAGNOSIS — E041 Nontoxic single thyroid nodule: Secondary | ICD-10-CM | POA: Diagnosis not present

## 2016-07-10 LAB — COMPREHENSIVE METABOLIC PANEL
ALT: 27 U/L (ref 0–35)
AST: 15 U/L (ref 0–37)
Albumin: 4.4 g/dL (ref 3.5–5.2)
Alkaline Phosphatase: 92 U/L (ref 39–117)
BUN: 13 mg/dL (ref 6–23)
CALCIUM: 9.8 mg/dL (ref 8.4–10.5)
CHLORIDE: 103 meq/L (ref 96–112)
CO2: 30 meq/L (ref 19–32)
CREATININE: 0.98 mg/dL (ref 0.40–1.20)
GFR: 65.53 mL/min (ref >60.00)
Glucose, Bld: 101 mg/dL — ABNORMAL HIGH (ref 70–99)
Potassium: 4.2 mEq/L (ref 3.5–5.1)
Sodium: 137 mEq/L (ref 135–145)
Total Bilirubin: 0.4 mg/dL (ref 0.2–1.2)
Total Protein: 7.4 g/dL (ref 6.0–8.3)

## 2016-07-10 LAB — CBC WITH DIFFERENTIAL/PLATELET
BASOS PCT: 0.6 % (ref 0.0–3.0)
Basophils Absolute: 0 10*3/uL (ref 0.0–0.1)
EOS ABS: 0.2 10*3/uL (ref 0.0–0.7)
EOS PCT: 2.5 % (ref 0.0–5.0)
HEMATOCRIT: 42.1 % (ref 36.0–46.0)
HEMOGLOBIN: 14 g/dL (ref 12.0–15.0)
LYMPHS PCT: 26.7 % (ref 12.0–46.0)
Lymphs Abs: 2.3 10*3/uL (ref 0.7–4.0)
MCHC: 33.3 g/dL (ref 30.0–36.0)
MCV: 83.9 fl (ref 78.0–100.0)
MONOS PCT: 6.8 % (ref 3.0–12.0)
Monocytes Absolute: 0.6 10*3/uL (ref 0.1–1.0)
NEUTROS ABS: 5.5 10*3/uL (ref 1.4–7.7)
Neutrophils Relative %: 63.4 % (ref 43.0–77.0)
PLATELETS: 442 10*3/uL — AB (ref 150.0–400.0)
RBC: 5.01 Mil/uL (ref 3.87–5.11)
RDW: 13.6 % (ref 11.5–15.5)
WBC: 8.6 10*3/uL (ref 4.0–10.5)

## 2016-07-10 LAB — T3, FREE: T3 FREE: 4.4 pg/mL — AB (ref 2.3–4.2)

## 2016-07-10 LAB — AMYLASE: Amylase: 40 U/L (ref 27–131)

## 2016-07-10 LAB — T4, FREE: Free T4: 0.8 ng/dL (ref 0.60–1.60)

## 2016-07-10 LAB — LIPASE: LIPASE: 16 U/L (ref 11.0–59.0)

## 2016-07-10 LAB — TSH: TSH: 1.96 u[IU]/mL (ref 0.35–4.50)

## 2016-07-10 MED ORDER — RANITIDINE HCL 150 MG PO CAPS: 150.0000 mg | ORAL_CAPSULE | Freq: Two times a day (BID) | ORAL | 0 refills | Status: DC

## 2016-07-10 NOTE — Progress Notes (Signed)
Pre visit review using our clinic tool,if applicable. No additional management support is needed unless otherwise documented below in the visit note.  

## 2016-07-10 NOTE — Telephone Encounter (Signed)
Lorraine Conrad called in to make provider Saguier aware that pt's ultra sound has been read.

## 2016-07-10 NOTE — Progress Notes (Signed)
Subjective:    Patient ID: Billy Coast, female    DOB: [birth year], 46 y.o.   MRN: [id number]  HPI  Pt in for for some recent abdomen pain. Pain in epigastric area and some pain that ran to her left upper quadrant. This pain occurred about 2 days after I last saw her. Not associated with food back then. Pain started to decrease and almost resolved. Then the other day on saturday while she ate barbeque pain came back. Then she went home layed down and pain eased.Pt takes prevacid otc.   Pt also mentions upset stomach after eating hamburger yesterday  and had to use bathroom(diarrhea). Then later pain subsided.  Then diarrhea resolved.  Pt states in past had ibs. But note on Saturday after eating barbeque no diarrhea.  Pt has seen neurologist for her left temporal ha's. Pt had normal MRA.  Pt had work up done by neurologist. Pt had sed rate elevation in past and elevated crp. Pt is going to see neruuologist tomorrow.  Pt not reporting HA today.  Pt states when she swallows feels lump rt side of her neck. Points to area adjacent to thyroid. Has this sensation for on year. Also fatigue and family history  females low thyroid.    Review of Systems  Constitutional: Positive for fatigue.  Respiratory: Negative for cough and shortness of breath.   Cardiovascular: Negative for chest pain and palpitations.  Gastrointestinal: Positive for abdominal pain. Negative for blood in stool, constipation, diarrhea, nausea, rectal pain and vomiting.       See hpi.  Neurological: Negative for dizziness, syncope, facial asymmetry, weakness and headaches.  Hematological: Negative for adenopathy. Does not bruise/bleed easily.  Psychiatric/Behavioral: Negative for behavioral problems, confusion, self-injury and suicidal ideas.    Past Medical History:  Diagnosis Date  . Allergy    SEASONAL  . Anemia   . Arthritis    "spine" (03/01/2014)  . Chronic lower back pain   . Endometriosis   . Erosive  gastritis   . External hemorrhoids   . GERD (gastroesophageal reflux disease)   . H/O shortness of breath 2014   Cardiopulmonary exercise test results  . Headache    "probably monthly" (03/01/2014)  . Migraine 1998; 02/2014   "this one's lasted 10 days straight" (03/01/2014)  . OSA on CPAP   . Ovarian cyst, left   . Proctalgia fugax      Social History   Social History  . Marital status: Married    Spouse name: N/A  . Number of children: N/A  . Years of education: N/A   Occupational History  . Not on file.   Social History Main Topics  . Smoking status: Never Smoker  . Smokeless tobacco: Never Used     Comment: father smoked in house growing up per pt.   . Alcohol use 0.0 oz/week     Comment: 03/01/2014 "I'll have 1-2 drinks maybe 3-4 times/yr"  . Drug use: No  . Sexual activity: Yes    Birth control/ protection: Surgical   Other Topics Concern  . Not on file   Social History Narrative   Daily caffiene use: 6 cups per day   Patient does not get regular excercise    Past Surgical History:  Procedure Laterality Date  . ABDOMINAL HYSTERECTOMY  04/2013  . BREAST BIOPSY Bilateral   . BREAST LUMPECTOMY Left ~ 1993  . CESAREAN SECTION  2002; 2006  . COLONOSCOPY    . LAPAROSCOPIC OVARIAN CYSTECTOMY  ~  1999  . NASAL SEPTUM SURGERY  ~ 1991  . TEMPOROMANDIBULAR JOINT SURGERY  ~ 39  . UPPER GASTROINTESTINAL ENDOSCOPY      Family History  Problem Relation Age of Onset  . Breast cancer Maternal Grandmother   . Prostate cancer Paternal Grandfather   . Colon polyps Father   . Heart disease Father   . Colon cancer Neg Hx     Allergies  Allergen Reactions  . Promethazine Hcl Other (See Comments)    Violent tremors   . Cephalexin Diarrhea and Itching  . Codeine Other (See Comments)    Was a child when she took this.  . Dilaudid [Hydromorphone] Nausea And Vomiting  . Azithromycin Rash    Head to toe rash    Current Outpatient Prescriptions on File Prior to  Visit  Medication Sig Dispense Refill  . albuterol (PROVENTIL HFA;VENTOLIN HFA) 108 (90 BASE) MCG/ACT inhaler Inhale 2 puffs into the lungs every 6 (six) hours as needed for wheezing or shortness of breath. 1 Inhaler 3  . azelastine (ASTELIN) 0.1 % nasal spray Place 2 sprays into both nostrils 2 (two) times daily. Use in each nostril as directed 30 mL 3  . beclomethasone (QVAR) 40 MCG/ACT inhaler Inhale 2 puffs into the lungs 2 (two) times daily at 10 AM and 5 PM. 1 Inhaler 3  . conjugated estrogens (PREMARIN) vaginal cream Use one time per weekly at bedtime-one fourth applicator vaginally    . cyclobenzaprine (FLEXERIL) 10 MG tablet Take 10 mg by mouth.    . diclofenac (VOLTAREN) 75 MG EC tablet Take 1 tablet (75 mg total) by mouth 2 (two) times daily. 30 tablet 1  . doxycycline (VIBRA-TABS) 100 MG tablet Take 1 tablet (100 mg total) by mouth 2 (two) times daily. Caps or generic ok. 20 tablet 0  . fluconazole (DIFLUCAN) 150 MG tablet Take 150 mg by mouth every other day.    . fluticasone (FLONASE) 50 MCG/ACT nasal spray Place 2 sprays into both nostrils daily. 16 g 1  . guaiFENesin (MUCINEX) 600 MG 12 hr tablet Take 1,200 mg by mouth 2 (two) times daily.    . hydrOXYzine (ATARAX/VISTARIL) 10 MG tablet Take 1 tablet (10 mg total) by mouth 3 (three) times daily as needed for itching. 30 tablet 0  . lansoprazole (PREVACID) 30 MG capsule Take 30 mg by mouth daily at 12 noon.    . loratadine (CLARITIN) 10 MG tablet Take 1 tablet (10 mg total) by mouth daily. 30 tablet 0  . methocarbamol (ROBAXIN) 500 MG tablet Take 1 tablet (500 mg total) by mouth every 6 (six) hours as needed for muscle spasms. 20 tablet 0  . mometasone (NASONEX) 50 MCG/ACT nasal spray USE 2 SPRAYS IN EACH NOSTRIL EVERY DAY 17 g 1  . montelukast (SINGULAIR) 10 MG tablet Take 1 tablet (10 mg total) by mouth at bedtime. 30 tablet 2  . mupirocin ointment (BACTROBAN) 2 % Place 1 application into the nose 2 (two) times daily. 22 g 0  .  nitroGLYCERIN (NITRODUR - DOSED IN MG/24 HR) 0.2 mg/hr patch Apply 1/4th patch to affected area of ankle, change daily 30 patch 1  . oxybutynin (DITROPAN-XL) 10 MG 24 hr tablet Take 1 tablet (10 mg total) by mouth at bedtime. 30 tablet 0  . predniSONE (DELTASONE) 20 MG tablet 1 tab po tid x 5 days 15 tablet 0  . Probiotic Product (PROBIOTIC-10) CAPS Take by mouth.    . sertraline (ZOLOFT) 50 MG tablet Take  50 mg by mouth.     No current facility-administered medications on file prior to visit.     BP 119/83   Pulse 97   Temp 98.2 F (36.8 C) (Oral)   Ht 5\' 4"  (1.626 m)   Wt 220 lb 6.4 oz (100 kg)   LMP 02/08/2013   SpO2 98%   BMI 37.83 kg/m       Objective:   Physical Exam  General Mental Status- Alert. General Appearance- Not in acute distress.   Skin General: Color- Normal Color. Moisture- Normal Moisture.  Neck Carotid Arteries- Normal color. Moisture- Normal Moisture. No carotid bruits. No JVD. Faint thyroid fullness.   Chest and Lung Exam Auscultation: Breath Sounds:-Normal.  Cardiovascular Auscultation:Rythm- Regular. Murmurs & Other Heart Sounds:Auscultation of the heart reveals- No Murmurs.  Abdomen Inspection:-Inspeection Normal. Palpation/Percussion:Note:No mass. Palpation and Percussion of the abdomen reveal- mild-moderate epigastricTender, Non Distended + BS, no rebound or guarding.    Neurologic Cranial Nerve exam:- CN III-XII intact(No nystagmus), symmetric smile. Strength:- 5/5 equal and symmetric strength both upper and lower extremities.      Assessment & Plan:  For your history of ha/temporal pain see neurologist tomorrow and will follow notes that are sent to me.  For abd pain will rx ranitidine and continue prevacid.Eat healthy diet. Will get labs today and I want you to schedule fasting abd [place]. Go down to radiology today to schedule.  For fatigue and feeling of lump in neck will get thyroid studies and order neck US. Please schedule that  today as well.  If abd labs negative, no clinical response to ranitidine, and pain persists will refer you back to GI.  Follow up in 3-4 weeks or as needed  Total time spent with pt 40 minutes. 50% of time discussion ha, abd pain and neck discomfort. Discussed needed work up and differential dx of these conditions. As well as had to coordinate and order labs.  Lucilla Petrenko, Percell Miller, PA-C

## 2016-07-10 NOTE — Patient Instructions (Addendum)
For your history of ha/temporal pain see neurologist tomorrow and will follow notes that are sent to me.  For abd pain will rx ranitidine and continue prevacid.Eat healthy diet. Will get labs today and I want you to schedule fasting abd [place]. Go down to radiology today to schedule.  For fatigue and feeling of lump in neck will get thyroid studies and order neck US. Please schedule that today as well.  If abd labs negative, no clinical response to ranitidine, and pain persists will refer you back to GI.  Follow up in 3-4 weeks or as needed

## 2016-07-11 ENCOUNTER — Ambulatory Visit (HOSPITAL_BASED_OUTPATIENT_CLINIC_OR_DEPARTMENT_OTHER)
Admission: RE | Admit: 2016-07-11 | Discharge: 2016-07-11 | Disposition: A | Discharge status: Home / Self Care | Payer: BLUE CROSS/BLUE SHIELD | Source: Ambulatory Visit | Attending: Medical | Admitting: Medical

## 2016-07-11 DIAGNOSIS — R197 Diarrhea, unspecified: Secondary | ICD-10-CM | POA: Diagnosis not present

## 2016-07-11 DIAGNOSIS — R899 Unspecified abnormal finding in specimens from other organs, systems and tissues: Secondary | ICD-10-CM | POA: Diagnosis not present

## 2016-07-11 DIAGNOSIS — R42 Dizziness and giddiness: Secondary | ICD-10-CM | POA: Diagnosis not present

## 2016-07-11 DIAGNOSIS — R109 Unspecified abdominal pain: Secondary | ICD-10-CM | POA: Diagnosis not present

## 2016-07-11 DIAGNOSIS — R1033 Periumbilical pain: Secondary | ICD-10-CM | POA: Diagnosis not present

## 2016-07-11 DIAGNOSIS — R51 Headache: Secondary | ICD-10-CM | POA: Diagnosis not present

## 2016-07-11 DIAGNOSIS — R208 Other disturbances of skin sensation: Secondary | ICD-10-CM | POA: Diagnosis not present

## 2016-07-11 DIAGNOSIS — R1013 Epigastric pain: Secondary | ICD-10-CM | POA: Diagnosis not present

## 2016-07-14 ENCOUNTER — Encounter: Payer: Self-pay | Admitting: *Deleted

## 2016-07-14 NOTE — Progress Notes (Signed)
Attempt to reach pt via phone, Voicemail is full and cannot accept messages at this time; Letter mailed/SLS 02/05

## 2016-07-15 ENCOUNTER — Telehealth: Payer: Self-pay | Admitting: *Deleted

## 2016-07-15 DIAGNOSIS — R51 Headache: Principal | ICD-10-CM

## 2016-07-15 DIAGNOSIS — E041 Nontoxic single thyroid nodule: Secondary | ICD-10-CM

## 2016-07-15 DIAGNOSIS — R700 Elevated erythrocyte sedimentation rate: Secondary | ICD-10-CM

## 2016-07-15 DIAGNOSIS — R519 Headache, unspecified: Secondary | ICD-10-CM

## 2016-07-15 NOTE — Telephone Encounter (Signed)
Changed my mind. Did refer to ent for temproral pain.  For thyroid region discomfort and nodule. Will refer to endocrinologist.

## 2016-07-15 NOTE — Telephone Encounter (Signed)
Referral to endocrine and ent placed.

## 2016-07-15 NOTE — Telephone Encounter (Signed)
I read your message. She wanted referal to ENT for temporal pain but she also mentioned burning in her legs. Regarding temporal pain and her desire not to come in(due to exposure to flu ext) she tends to run high sed rate and I have referred to specialist before. One recommended biopsy and she declined(work up for temporal arteritis). I could offer her sed rate stat(she can go to lab)  But would you advise her if severe ha, blurred vision, numbness weakness of legs etc then ED evaluation. As typically at any time temporal pain/ha needs office visit and  Neuro exam and from what I gather she does not want office visi  I can put in ENT referral. I will mention temporal pain and neck pain/thyorid nodule as dx.

## 2016-07-15 NOTE — Telephone Encounter (Signed)
Result Notes   Notes Recorded by Rockwell Germany, CMA on 07/15/2016 at 7:48 AM EST Patient informed, understood & agreed; patient reports that she is still having sharp pian in temples and is now having "burning" sensation in lower legs. Explained to patient with the new and ongoing symptoms that maybe it would give her piece of mind to go ahead and have the ENT Referral placed, as she had some noticeable anxiety still after receiving results; patient agreed and would like to have ENT referral placed. Also, I offered patient an appointment with either another provider Waldon Merl was full] for Tuesday, 07/15/16 and/or with Percell Miller on Wednesday, 07/16/16; patient declined and stated that she had to go out of town this weekend for a functioning for her daughter and did not want to come in to office and risk getting a viral /flu illness. Informed her that I would forward information to PCP and call her back with any further instructions for this week/SLS 02/05 Please Advise and place ENT referral per patient request.   ------  Notes Recorded by Rockwell Germany, CMA on 07/14/2016 at 2:02 PM EST Attempt to reach pt via phone, "Voicemail is full and cannot accept messages at this time"; Letter mailed/SLS 02/05 ------  Notes Recorded by Mackie Pai, PA-C on 07/12/2016 at 7:29 PM EST Not sure pt feels nodule sometimes other times not. Every body is different. Sometime person don't feel at all. Radiologist does not recommend biopsy or further work up presently . But if area enlarges or becomes more symptomatic would repeat US to assess for change or refer to specialist.(or can refer to specialist now is she wants reassurance.) ------  Notes Recorded by Donell Sievert Ewing, CMA on 07/11/2016 at 4:47 PM EST Patient informed of results/instructions.  She states that sometimes she can feel the nodule/other times not. Why? Also saw the neurologist today and her reactive protein was lower. ------  Notes Recorded by  Mackie Pai, PA-C on 07/10/2016 at 11:24 PM EST On [place] of neck. Single small complex right nodule does not meet criteria for biopsy or dedicated imaging follow-up.Will watch this area clinically. If changes might refer to endocrinologist or ENT.

## 2016-07-16 NOTE — Telephone Encounter (Signed)
Patient informed, understood & agreed/SLS 02/07

## 2016-07-24 ENCOUNTER — Encounter: Payer: Self-pay | Admitting: Endocrinology

## 2016-08-07 ENCOUNTER — Ambulatory Visit: Payer: BLUE CROSS/BLUE SHIELD | Admitting: Medical

## 2016-08-14 ENCOUNTER — Ambulatory Visit (INDEPENDENT_AMBULATORY_CARE_PROVIDER_SITE_OTHER): Payer: BLUE CROSS/BLUE SHIELD | Admitting: Internal Medicine

## 2016-08-14 ENCOUNTER — Encounter: Payer: Self-pay | Admitting: Internal Medicine

## 2016-08-14 VITALS — BP 124/88 | HR 89 | Ht 64.5 in | Wt 221.0 lb

## 2016-08-14 DIAGNOSIS — R7989 Other specified abnormal findings of blood chemistry: Secondary | ICD-10-CM

## 2016-08-14 DIAGNOSIS — R946 Abnormal results of thyroid function studies: Secondary | ICD-10-CM

## 2016-08-14 LAB — T3, FREE: T3, Free: 4 pg/mL (ref 2.3–4.2)

## 2016-08-14 LAB — TSH: TSH: 2.32 u[IU]/mL (ref 0.35–4.50)

## 2016-08-14 LAB — T4, FREE: FREE T4: 0.94 ng/dL (ref 0.60–1.60)

## 2016-08-14 NOTE — Patient Instructions (Signed)
Please stop at the lab.  Please return in 1 year.  

## 2016-08-14 NOTE — Progress Notes (Signed)
Patient ID: Lorraine Conrad, female   DOB: 1971/06/02, 46 y.o.   MRN: [id number]    HPI  Lorraine Conrad is a 46 y.o.-year-old female, referred by her PCP, Saguier, Percell Miller, PA-C, for evaluation fo thyroid nodule and a higher T3.   She started to feel a thyroid nodule in 2017 (1 Year ago). She feels the size of the nodule fluctuates.  Thyroid U/S (07/10/2016): 1 small nodule 0.9 x 0.6 x 0.6 cm.  Pt c/o: - feeling her R thyroid nodule, especially when turns head - no hoarseness - no dysphagia - occasional choking - no SOB with lying down  I reviewed pt's thyroid tests - TSH was slightly high: Lab Results  Component Value Date   TSH 1.96 07/10/2016   TSH 1.85 09/04/2015   TSH 2.226 02/28/2015   TSH 0.43 01/16/2014   TSH 1.949 03/18/2013   FREET4 0.80 07/10/2016   T3FREE 4.4 (H) 07/10/2016    Pt c/o: - + fatigue - + both: heat intolerance/cold intolerance - no tremors - no palpitations - no anxiety/depression - no constipation, + diarrhea - + weight gain - no dry skin - no hair loss  + FH of thyroid ds: mother >> nodule, all members of mother's family had thyroid ds.. N h/o radiation tx to head or neck. No FH of thyroid cancer.  No seaweed or kelp. No recent contrast studies. No recent steroid use. No herbal supplements. No Biotin supplements or Hair, Skin and Nails vitamins.  She in on B12, Magnesium.   Pt also has a history of hysterectomy + oophorectomy for endometriosis in 2015. Beginning of last year she had the second ovary resected.  She has a h/p positive ANCA, h/o viral meningitis  ROS: Constitutional: no weight gain/loss, no fatigue, no subjective hyperthermia/hypothermia, + occasional dysuria after her hysterectomy  Eyes: no blurry vision, no xerophthalmia ENT: no sore throat, no nodules palpated in throat, no dysphagia/odynophagia, no hoarseness, + tinnitus, + hypoacusis Cardiovascular: + all:CP/SOB/leg swelling, no palpitations Respiratory: no  cough/SOB Gastrointestinal: + N/no V/+ D/no C/+ heartburn Musculoskeletal: + both:muscle/joint aches Skin: no rashes Neurological: no tremors/numbness/tingling/dizziness, + HA Psychiatric: no depression/anxiety  Past Medical History:  Diagnosis Date  . Allergy    SEASONAL  . Anemia   . Arthritis    "spine" (03/01/2014)  . Chronic lower back pain   . Endometriosis   . Erosive gastritis   . External hemorrhoids   . GERD (gastroesophageal reflux disease)   . H/O shortness of breath 2014   Cardiopulmonary exercise test results  . Headache    "probably monthly" (03/01/2014)  . Migraine 1998; 02/2014   "this one's lasted 10 days straight" (03/01/2014)  . OSA on CPAP   . Ovarian cyst, left   . Proctalgia fugax    Past Surgical History:  Procedure Laterality Date  . ABDOMINAL HYSTERECTOMY  04/2013  . BREAST BIOPSY Bilateral   . BREAST LUMPECTOMY Left ~ 1993  . CESAREAN SECTION  2002; 2006  . COLONOSCOPY    . LAPAROSCOPIC OVARIAN CYSTECTOMY  ~ 1999  . NASAL SEPTUM SURGERY  ~ 1991  . TEMPOROMANDIBULAR JOINT SURGERY  ~ 43  . UPPER GASTROINTESTINAL ENDOSCOPY     Social History   Social History  . Marital status: Married    Spouse name: N/A  . Number of children: 2   Occupational History  . [employer]   Social History Main Topics  . Smoking status: Never Smoker  . Smokeless tobacco: Never Used  Comment: father smoked in house growing up per pt.   . Alcohol use 0.0 oz/week     Comment: 03/01/2014 "I'll have 1-2 drinks maybe 3-4 times/yr"  . Drug use: No   Social History Narrative   Daily caffiene use: 6 cups per day   Patient does not get regular excercise   Current Outpatient Prescriptions on File Prior to Visit  Medication Sig Dispense Refill  . albuterol (PROVENTIL HFA;VENTOLIN HFA) 108 (90 BASE) MCG/ACT inhaler Inhale 2 puffs into the lungs every 6 (six) hours as needed for wheezing or shortness of breath. 1 Inhaler 3  . azelastine (ASTELIN) 0.1 % nasal spray Place 2  sprays into both nostrils 2 (two) times daily. Use in each nostril as directed 30 mL 3  . beclomethasone (QVAR) 40 MCG/ACT inhaler Inhale 2 puffs into the lungs 2 (two) times daily at 10 AM and 5 PM. 1 Inhaler 3  . conjugated estrogens (PREMARIN) vaginal cream Use one time per weekly at bedtime-one fourth applicator vaginally    . diclofenac (VOLTAREN) 75 MG EC tablet Take 1 tablet (75 mg total) by mouth 2 (two) times daily. 30 tablet 1  . fluticasone (FLONASE) 50 MCG/ACT nasal spray Place 2 sprays into both nostrils daily. 16 g 1  . guaiFENesin (MUCINEX) 600 MG 12 hr tablet Take 1,200 mg by mouth 2 (two) times daily.    . hydrOXYzine (ATARAX/VISTARIL) 10 MG tablet Take 1 tablet (10 mg total) by mouth 3 (three) times daily as needed for itching. 30 tablet 0  . lansoprazole (PREVACID) 30 MG capsule Take 30 mg by mouth daily at 12 noon.    . loratadine (CLARITIN) 10 MG tablet Take 1 tablet (10 mg total) by mouth daily. 30 tablet 0  . methocarbamol (ROBAXIN) 500 MG tablet Take 1 tablet (500 mg total) by mouth every 6 (six) hours as needed for muscle spasms. 20 tablet 0  . mometasone (NASONEX) 50 MCG/ACT nasal spray USE 2 SPRAYS IN EACH NOSTRIL EVERY DAY 17 g 1  . Probiotic Product (PROBIOTIC-10) CAPS Take by mouth.    . ranitidine (ZANTAC) 150 MG capsule Take 1 capsule (150 mg total) by mouth 2 (two) times daily. 60 capsule 0  . cyclobenzaprine (FLEXERIL) 10 MG tablet Take 10 mg by mouth.    . montelukast (SINGULAIR) 10 MG tablet Take 1 tablet (10 mg total) by mouth at bedtime. (Patient not taking: Reported on 08/14/2016) 30 tablet 2  . mupirocin ointment (BACTROBAN) 2 % Place 1 application into the nose 2 (two) times daily. (Patient not taking: Reported on 08/14/2016) 22 g 0  . nitroGLYCERIN (NITRODUR - DOSED IN MG/24 HR) 0.2 mg/hr patch Apply 1/4th patch to affected area of ankle, change daily (Patient not taking: Reported on 08/14/2016) 30 patch 1  . oxybutynin (DITROPAN-XL) 10 MG 24 hr tablet Take 1  tablet (10 mg total) by mouth at bedtime. (Patient not taking: Reported on 08/14/2016) 30 tablet 0  . sertraline (ZOLOFT) 50 MG tablet Take 50 mg by mouth.     No current facility-administered medications on file prior to visit.    Allergies  Allergen Reactions  . Promethazine Hcl Other (See Comments)    Violent tremors   . Cephalexin Diarrhea and Itching  . Codeine Other (See Comments)    Was a child when she took this.  . Dilaudid [Hydromorphone] Nausea And Vomiting  . Azithromycin Rash    Head to toe rash   Family History  Problem Relation Age of Onset  .  Breast cancer Maternal Grandmother   . Prostate cancer Paternal Grandfather   . Colon polyps Father   . Heart disease Father   . Colon cancer Neg Hx    PE: BP 124/88 (BP Location: Left Arm, Patient Position: Sitting)   Pulse 89   Ht 5' 4.5" (1.638 m)   Wt 221 lb (100.2 kg)   LMP 02/08/2013   SpO2 97%   BMI 37.35 kg/m  Wt Readings from Last 3 Encounters:  08/14/16 221 lb (100.2 kg)  07/10/16 220 lb 6.4 oz (100 kg)  06/19/16 221 lb 3.2 oz (100.3 kg)   Constitutional: obese, in NAD Eyes: PERRLA, EOMI, no exophthalmos ENT: moist mucous membranes, no thyromegaly, no thyroid masses palpated,no cervical lymphadenopathy Cardiovascular: RRR, No MRG Respiratory: CTA B Gastrointestinal: abdomen soft, NT, ND, BS+ Musculoskeletal: no deformities, strength intact in all 4;  Skin: moist, warm, no rashes Neurological: no tremor with outstretched hands, DTR normal in all 4  ASSESSMENT: 1. R thyroid nodule  2. Abnormal TFTs (elevated free T3)  PLAN: 1. R thyroid nodule - I reviewed the images of her thyroid ultrasound along with the patient. I pointed out that the R thyroid nodule is very small, sub-cm, with a very low risk for cancer. Also, the nodule is mostly cystic, more wide than tall, without microcalcifications and without internal blood flow. These are all good signs. Pt does not have a thyroid cancer family history or  a personal history of RxTx to head/neck. All these would favor benignity.  - The nodule appears deep in the right thyroid lobe, which is probably why she is feeling it when she turns her head. Accumulation of cholloid in the nodule can also account for the fluctuation in size that she sometimes feels. I reassured her that this is a benign process. - We do not necessarily need to check serial ultrasounds for the nodule, however, if she starts having more intense neck compression symptoms, we may repeat another ultrasound. Patient agrees with the plan.  2. Abnormal TFTs (elevated free T3) - We discussed that she had a slightly increased free T3, usualy of no clinical consequence - we will check a completer thyroid panel today, adding thyroid Abs to screen for Hashimoto's ds.: Orders Placed This Encounter  Procedures  . T3, free  . T4, free  . TSH  . Thyroglobulin antibody  . Thyroid peroxidase antibody  - I reviewed the images of her thyroid ultrasound along with the patient. The thyroid gland is actually not heterogeneous, as expected for Hashimoto's thyroiditis. I explained that this is an autoimmune disorder, in which she develops antibodies against her own thyroid. The antibodies bind to the thyroid tissue and cause inflammation, and, eventually, destruction of the gland and hypothyroidism. We don't know how long this process can be, it can last from months to years.  - I also explained that thyroid enlargement especially at the beginning of her Hashimoto thyroiditis course is not uncommon, and it has a waxing and waning character.   Component     Latest Ref Rng & Units 08/14/2016  TSH     0.35 - 4.50 uIU/mL 2.32  T4,Free(Direct)     0.60 - 1.60 ng/dL 0.94  Triiodothyronine,Free,Serum     2.3 - 4.2 pg/mL 4.0  Thyroglobulin Ab     <2 IU/mL <1  Thyroperoxidase Ab SerPl-aCnc     <9 IU/mL <1  Thyroid antibodies normal. Thyroid tests are now all normal.  Philemon Kingdom, MD PhD [facility]  Endocrinology

## 2016-08-15 LAB — THYROGLOBULIN ANTIBODY: Thyroglobulin Ab: <1 IU/mL (ref <2)

## 2016-08-15 LAB — THYROID PEROXIDASE ANTIBODY: Thyroperoxidase Ab SerPl-aCnc: <1 IU/mL (ref <9)

## 2016-08-29 ENCOUNTER — Telehealth: Payer: Self-pay | Admitting: Medical

## 2016-08-29 NOTE — Telephone Encounter (Signed)
Relation to pt: self  Call back number: [phone] Pharmacy[id number] [place], [place]. AT [address] [phone] (Phone) [phone] (Fax)     Reason for call:  Patient states due to her back , hip pain and leg pain requesting Rx, patient states she thinks PA prescribe prednisone in the past,please advise

## 2016-09-01 NOTE — Telephone Encounter (Signed)
Please call patient and schedule OV per provider's instructions below/SLS

## 2016-09-01 NOTE — Telephone Encounter (Signed)
Pt called on Friday. Wanting possible prednisone. Best for her to come in. May benefit from sed rate and visit before prescribing strong med such as prednisone. Advise early afternoon appointment 1-1:30 tommorow or wed.

## 2016-09-01 NOTE — Telephone Encounter (Signed)
Patient will check her schedule and call back.

## 2016-09-03 ENCOUNTER — Encounter: Payer: Self-pay | Admitting: Medical

## 2016-09-03 ENCOUNTER — Ambulatory Visit (INDEPENDENT_AMBULATORY_CARE_PROVIDER_SITE_OTHER): Payer: BLUE CROSS/BLUE SHIELD | Admitting: Medical

## 2016-09-03 ENCOUNTER — Ambulatory Visit (HOSPITAL_BASED_OUTPATIENT_CLINIC_OR_DEPARTMENT_OTHER)
Admission: RE | Admit: 2016-09-03 | Discharge: 2016-09-03 | Disposition: A | Discharge status: Home / Self Care | Payer: BLUE CROSS/BLUE SHIELD | Source: Ambulatory Visit | Attending: Medical | Admitting: Medical

## 2016-09-03 VITALS — BP 108/80 | HR 94 | Temp 98.5°F | Ht 64.5 in | Wt 222.4 lb

## 2016-09-03 DIAGNOSIS — M79672 Pain in left foot: Secondary | ICD-10-CM | POA: Insufficient documentation

## 2016-09-03 DIAGNOSIS — G8929 Other chronic pain: Secondary | ICD-10-CM

## 2016-09-03 DIAGNOSIS — M25572 Pain in left ankle and joints of left foot: Secondary | ICD-10-CM | POA: Insufficient documentation

## 2016-09-03 DIAGNOSIS — M5442 Lumbago with sciatica, left side: Secondary | ICD-10-CM | POA: Diagnosis not present

## 2016-09-03 DIAGNOSIS — S99912A Unspecified injury of left ankle, initial encounter: Secondary | ICD-10-CM | POA: Diagnosis not present

## 2016-09-03 DIAGNOSIS — L739 Follicular disorder, unspecified: Secondary | ICD-10-CM | POA: Diagnosis not present

## 2016-09-03 DIAGNOSIS — S99922A Unspecified injury of left foot, initial encounter: Secondary | ICD-10-CM | POA: Diagnosis not present

## 2016-09-03 MED ORDER — DICLOFENAC SODIUM 75 MG PO TBEC: 75.0000 mg | DELAYED_RELEASE_TABLET | Freq: Two times a day (BID) | ORAL | 1 refills | Status: DC

## 2016-09-03 MED ORDER — KETOROLAC TROMETHAMINE 60 MG/2ML IM SOLN
60.0000 mg | Freq: Once | INTRAMUSCULAR | Status: AC
  Administered 2016-09-03: 60 mg via INTRAMUSCULAR

## 2016-09-03 MED ORDER — DOXYCYCLINE HYCLATE 100 MG PO TABS: 100.0000 mg | ORAL_TABLET | Freq: Two times a day (BID) | ORAL | 0 refills | Status: DC

## 2016-09-03 NOTE — Progress Notes (Signed)
Pre visit review using our clinic review tool, if applicable. No additional management support is needed unless otherwise documented below in the visit note. 

## 2016-09-03 NOTE — Patient Instructions (Addendum)
For your back pain, foot pain and ankle pain we gave toradol 60 mg im. Will rx dicolfenac that you start tomorrow.  Will get sed rate today and uric acid.   Will repeat xray of foot and ankle. Refer to podiatrist.  For the slight red area adjacent to nares will rx doxycycline. This area looks like inflammed follicle. Recheck in 10 days. If area still there refer to dermatologist.

## 2016-09-03 NOTE — Progress Notes (Signed)
Subjective:    Patient ID: Lorraine Conrad, female    DOB: 1971-02-25, 46 y.o.   MRN: [id number]  HPI  Pt in states recent flare of her pain on bottom of her foot for 2 weeks.. Some pain on top of foot and some on her ankle. Last week every step she was taking had pain. Pt has seen Dr. Barbaraann Barthel had seen pt before. Pt has old boot/shoe that husband uses. She thought this was better in past compared to what Dr. Felipe Drone had offered her. Pt has seen podiatrist for her nails but never saw for her foot. Pt xray of foot negative in 2016.  Pt back started hurting on Friday with some hip pain. Some pain that shoots down her leg from her back. Pt had back in past. Pt had xray in 2016 of her lumbar spine.Prior mri of back showed.  1. Minimal degenerative annular disc bulge at L4-5 and L5-S1 without significant stenosis. 2. Mild bilateral facet arthrosis at L4-5. 3. Otherwise normal MRI of the lumbar spine. No significant canal or foraminal stenosis. No neural impingement.  Pt called on Friday wanting some prednisone for pain. But she had called very late.  Pt has no leg weakness, no foot drop, or any incontinence.  Pt does have rheumatology appointment by neurologist. That appointment is pending.   Review of Systems  Constitutional: Negative for chills, diaphoresis, fatigue and fever.  Respiratory: Negative for cough, chest tightness and shortness of breath.   Cardiovascular: Negative for chest pain and palpitations.  Musculoskeletal: Positive for back pain. Negative for gait problem.       Lt ankle and foot pain.  Skin:       End noted small area left nares creae. Slight red and raised.  Neurological: Negative for dizziness, seizures, weakness, light-headedness, numbness and headaches.  Hematological: Negative for adenopathy. Does not bruise/bleed easily.  Psychiatric/Behavioral: Negative for behavioral problems and confusion.   Past Medical History:  Diagnosis Date  . Allergy    SEASONAL    . Anemia   . Arthritis    "spine" (03/01/2014)  . Chronic lower back pain   . Endometriosis   . Erosive gastritis   . External hemorrhoids   . GERD (gastroesophageal reflux disease)   . H/O shortness of breath 2014   Cardiopulmonary exercise test results  . Headache    "probably monthly" (03/01/2014)  . Migraine 1998; 02/2014   "this one's lasted 10 days straight" (03/01/2014)  . OSA on CPAP   . Ovarian cyst, left   . Proctalgia fugax      Social History   Social History  . Marital status: Married    Spouse name: N/A  . Number of children: N/A  . Years of education: N/A   Occupational History  . Not on file.   Social History Main Topics  . Smoking status: Never Smoker  . Smokeless tobacco: Never Used     Comment: father smoked in house growing up per pt.   . Alcohol use 0.0 oz/week     Comment: 03/01/2014 "I'll have 1-2 drinks maybe 3-4 times/yr"  . Drug use: No  . Sexual activity: Yes    Birth control/ protection: Surgical   Other Topics Concern  . Not on file   Social History Narrative   Daily caffiene use: 6 cups per day   Patient does not get regular excercise    Past Surgical History:  Procedure Laterality Date  . ABDOMINAL HYSTERECTOMY  04/2013  .  BREAST BIOPSY Bilateral   . BREAST LUMPECTOMY Left ~ 1993  . CESAREAN SECTION  2002; 2006  . COLONOSCOPY    . LAPAROSCOPIC OVARIAN CYSTECTOMY  ~ 1999  . NASAL SEPTUM SURGERY  ~ 1991  . TEMPOROMANDIBULAR JOINT SURGERY  ~ 78  . UPPER GASTROINTESTINAL ENDOSCOPY      Family History  Problem Relation Age of Onset  . Breast cancer Maternal Grandmother   . Prostate cancer Paternal Grandfather   . Colon polyps Father   . Heart disease Father   . Colon cancer Neg Hx     Allergies  Allergen Reactions  . Promethazine Hcl Other (See Comments)    Violent tremors   . Cephalexin Diarrhea and Itching  . Codeine Other (See Comments)    Was a child when she took this.  . Dilaudid [Hydromorphone] Nausea And  Vomiting  . Azithromycin Rash    Head to toe rash    Current Outpatient Prescriptions on File Prior to Visit  Medication Sig Dispense Refill  . albuterol (PROVENTIL HFA;VENTOLIN HFA) 108 (90 BASE) MCG/ACT inhaler Inhale 2 puffs into the lungs every 6 (six) hours as needed for wheezing or shortness of breath. 1 Inhaler 3  . azelastine (ASTELIN) 0.1 % nasal spray Place 2 sprays into both nostrils 2 (two) times daily. Use in each nostril as directed 30 mL 3  . beclomethasone (QVAR) 40 MCG/ACT inhaler Inhale 2 puffs into the lungs 2 (two) times daily at 10 AM and 5 PM. 1 Inhaler 3  . conjugated estrogens (PREMARIN) vaginal cream Use one time per weekly at bedtime-one fourth applicator vaginally    . Cyanocobalamin (VITAMIN B-12) 1000 MCG SUBL Place under the tongue.    . cyclobenzaprine (FLEXERIL) 10 MG tablet Take 10 mg by mouth.    . diclofenac (VOLTAREN) 75 MG EC tablet Take 1 tablet (75 mg total) by mouth 2 (two) times daily. 30 tablet 1  . fluticasone (FLONASE) 50 MCG/ACT nasal spray Place 2 sprays into both nostrils daily. 16 g 1  . guaiFENesin (MUCINEX) 600 MG 12 hr tablet Take 1,200 mg by mouth 2 (two) times daily.    . hydrOXYzine (ATARAX/VISTARIL) 10 MG tablet Take 1 tablet (10 mg total) by mouth 3 (three) times daily as needed for itching. 30 tablet 0  . lansoprazole (PREVACID) 30 MG capsule Take 30 mg by mouth daily at 12 noon.    . loratadine (CLARITIN) 10 MG tablet Take 1 tablet (10 mg total) by mouth daily. 30 tablet 0  . methocarbamol (ROBAXIN) 500 MG tablet Take 1 tablet (500 mg total) by mouth every 6 (six) hours as needed for muscle spasms. 20 tablet 0  . mometasone (NASONEX) 50 MCG/ACT nasal spray USE 2 SPRAYS IN EACH NOSTRIL EVERY DAY 17 g 1  . montelukast (SINGULAIR) 10 MG tablet Take 1 tablet (10 mg total) by mouth at bedtime. 30 tablet 2  . mupirocin ointment (BACTROBAN) 2 % Place 1 application into the nose 2 (two) times daily. 22 g 0  . nitroGLYCERIN (NITRODUR - DOSED IN  MG/24 HR) 0.2 mg/hr patch Apply 1/4th patch to affected area of ankle, change daily 30 patch 1  . oxybutynin (DITROPAN-XL) 10 MG 24 hr tablet Take 1 tablet (10 mg total) by mouth at bedtime. 30 tablet 0  . Probiotic Product (PROBIOTIC-10) CAPS Take by mouth.    . ranitidine (ZANTAC) 150 MG capsule Take 1 capsule (150 mg total) by mouth 2 (two) times daily. 60 capsule 0  .  sertraline (ZOLOFT) 50 MG tablet Take 50 mg by mouth.     No current facility-administered medications on file prior to visit.     BP 108/80 (BP Location: Left Arm, Patient Position: Sitting, Cuff Size: Large)   Pulse 94   Temp 98.5 F (36.9 C) (Oral)   Ht 5' 4.5" (1.638 m)   Wt 222 lb 6 oz (100.9 kg)   LMP 02/08/2013   SpO2 98%   BMI 37.58 kg/m       Objective:   Physical Exam  General- No acute distress. Pleasant patient. Neck- Full range of motion, no jvd Lungs- Clear, even and unlabored. Heart- regular rate and rhythm. Neurologic- CNII- XII grossly intact.  Left ankle- faint tenderness to palpation anterior ascpect  Left foot- heel pain on palpation.  Back- faint pain mid lumbar and left si area. Pain on straight leg lift.  Legs- l5-s1 sensation intact. Normal strength.  Skin- left nares crease. Small red raised area. About 44mm in size9not irregularly shaped and not multicolored). Not indurated. Looks like follicle.         Assessment & Plan:  For your back pain, foot pain and ankle pain we gave toradol 60 mg im. Will rx dicolfenac that you start tomorrow.  Will get sed rate today and uric acid.   Will repeat xray of foot and ankle. Refer to podiatrist.  For the slight red area adjacent to nares will rx doxycycline. This area looks like inflammed follicle. Recheck in 10 days. If area still there refer to dermatologist.  I gave print rx doxy. mistunderstood her concern but saw follicle. Advised pt to call neuroligist about mra. From what I see mra looks normal.  Total time spent with pt 40  minutes. 50% spent couneling on her various conditions and answering questions regarding, her feet, ankle, back. Old mri, prior rheumatologist referral and her questions on her recent mri.    Lorraine Conrad, Lorraine Miller, PA-C

## 2016-09-04 ENCOUNTER — Telehealth: Payer: Self-pay | Admitting: Medical

## 2016-09-04 LAB — URIC ACID: Uric Acid, Serum: 5.5 mg/dL (ref 2.4–7.0)

## 2016-09-04 LAB — SEDIMENTATION RATE: Sed Rate: 18 mm/hr (ref 0–20)

## 2016-09-04 NOTE — Telephone Encounter (Signed)
°  Relation to pt: self  Call back number:[phone] [id number] , [place]. AT [address] [place] [phone] ([phone] (Fax)     Reason for call:  Patient states inejcetion from 09/03/16 has weared off, requesting clinical advice for hip, leg and back pain, please advise

## 2016-09-08 ENCOUNTER — Ambulatory Visit (HOSPITAL_BASED_OUTPATIENT_CLINIC_OR_DEPARTMENT_OTHER)
Admission: RE | Admit: 2016-09-08 | Discharge: 2016-09-08 | Disposition: A | Discharge status: Home / Self Care | Payer: BLUE CROSS/BLUE SHIELD | Source: Ambulatory Visit | Attending: Medical | Admitting: Medical

## 2016-09-08 ENCOUNTER — Encounter: Payer: Self-pay | Admitting: Medical

## 2016-09-08 ENCOUNTER — Ambulatory Visit (INDEPENDENT_AMBULATORY_CARE_PROVIDER_SITE_OTHER): Payer: BLUE CROSS/BLUE SHIELD | Admitting: Medical

## 2016-09-08 ENCOUNTER — Telehealth: Payer: Self-pay | Admitting: Medical

## 2016-09-08 VITALS — BP 128/86 | HR 80 | Temp 97.9°F | Resp 16 | Ht 64.5 in | Wt 222.6 lb

## 2016-09-08 DIAGNOSIS — R0789 Other chest pain: Secondary | ICD-10-CM

## 2016-09-08 DIAGNOSIS — M546 Pain in thoracic spine: Secondary | ICD-10-CM

## 2016-09-08 DIAGNOSIS — R51 Headache: Secondary | ICD-10-CM

## 2016-09-08 DIAGNOSIS — R1013 Epigastric pain: Secondary | ICD-10-CM

## 2016-09-08 DIAGNOSIS — R519 Headache, unspecified: Secondary | ICD-10-CM

## 2016-09-08 LAB — CBC WITH DIFFERENTIAL/PLATELET
BASOS PCT: 0.4 % (ref 0.0–3.0)
Basophils Absolute: 0 10*3/uL (ref 0.0–0.1)
EOS PCT: 3.7 % (ref 0.0–5.0)
Eosinophils Absolute: 0.3 10*3/uL (ref 0.0–0.7)
HEMATOCRIT: 39.7 % (ref 36.0–46.0)
HEMOGLOBIN: 13.3 g/dL (ref 12.0–15.0)
LYMPHS PCT: 24.8 % (ref 12.0–46.0)
Lymphs Abs: 2 10*3/uL (ref 0.7–4.0)
MCHC: 33.4 g/dL (ref 30.0–36.0)
MCV: 82.6 fl (ref 78.0–100.0)
MONO ABS: 0.6 10*3/uL (ref 0.1–1.0)
Monocytes Relative: 6.8 % (ref 3.0–12.0)
Neutro Abs: 5.2 10*3/uL (ref 1.4–7.7)
Neutrophils Relative %: 64.3 % (ref 43.0–77.0)
Platelets: 396 10*3/uL (ref 150.0–400.0)
RBC: 4.8 Mil/uL (ref 3.87–5.11)
RDW: 13.8 % (ref 11.5–15.5)
WBC: 8.1 10*3/uL (ref 4.0–10.5)

## 2016-09-08 LAB — SEDIMENTATION RATE: Sed Rate: 12 mm/hr (ref 0–20)

## 2016-09-08 LAB — COMPREHENSIVE METABOLIC PANEL
ALT: 37 U/L — ABNORMAL HIGH (ref 0–35)
AST: 19 U/L (ref 0–37)
Albumin: 4.3 g/dL (ref 3.5–5.2)
Alkaline Phosphatase: 82 U/L (ref 39–117)
BILIRUBIN TOTAL: 0.4 mg/dL (ref 0.2–1.2)
BUN: 13 mg/dL (ref 6–23)
CALCIUM: 9.6 mg/dL (ref 8.4–10.5)
CO2: 31 meq/L (ref 19–32)
Chloride: 105 mEq/L (ref 96–112)
Creatinine, Ser: 1 mg/dL (ref 0.40–1.20)
GFR: 63.97 mL/min (ref >60.00)
Glucose, Bld: 93 mg/dL (ref 70–99)
POTASSIUM: 4.5 meq/L (ref 3.5–5.1)
Sodium: 140 mEq/L (ref 135–145)
Total Protein: 7.7 g/dL (ref 6.0–8.3)

## 2016-09-08 LAB — LIPASE: Lipase: 23 U/L (ref 11.0–59.0)

## 2016-09-08 LAB — AMYLASE: Amylase: 33 U/L (ref 27–131)

## 2016-09-08 NOTE — Progress Notes (Signed)
Pre visit review using our clinic review tool, if applicable. No additional management support is needed unless otherwise documented below in the visit note. 

## 2016-09-08 NOTE — Patient Instructions (Addendum)
For your recent epigastric pain will get cbc, cmp, amylase, lipase, and h pylori breath test. Use prevacid daily and add back zantac twice daily. Eat healthy over next week as well. If pain reoccurs might refer to GI.  For atypical chest pain we got ekg today. For recent thoracic back pain get xray.  If severe thoracic area pain again with chest pain then ED evaluation.  Due to  temporal pain history with elevated sed rate will get sed rate.   Follow up 10-14 days or as needed

## 2016-09-08 NOTE — Telephone Encounter (Signed)
Pt was seen in office today and called back at 12pm wanting to know if she could restart diclofenac. Advised pt per verbal from PCP that it is ok to restart but if it begins to hurt her stomach she should stop medication.

## 2016-09-08 NOTE — Telephone Encounter (Signed)
Patient seeing Mackie Pai today 09/08/16

## 2016-09-08 NOTE — Progress Notes (Signed)
Subjective:    Patient ID: Lorraine Conrad, female    DOB: 1970/06/21, 46 y.o.   MRN: [id number]  HPI  Pt in for report of late Friday night had some pain in chest,upper abdomen  and back This seemed to occur after she ate some barbeque early in day  and some other very spicy food/burrito bowel. Pt mentioned some jalepeno were at bottom of the bowel.  Pt states she had back pain and some upper stomach pain. Pt states her back pain was moderate. Pt husband gave her gas-x equivalent or mylanta. Then gave tums. Pt symptoms eventually calmed down.   Pain was severe and she felt like could not talk. Pain was at bra line and wrapped around her upper stomach area.   Also yesterday when she was eating had some left lower quadrant pain. Pt heading out of town on vacation.  Pt menioned last night had some epigastric pain.  Pt also mentioned couple of weeks ago had some spasms to her left side chest. /pec area. Only last few minutes.  Pt also reports left side of her temporal area mild painful yesterday am. Pt sed rate was most recently normal.    Review of Systems  Constitutional: Negative for chills, fatigue and fever.  Respiratory: Negative for cough, chest tightness, shortness of breath and wheezing.   Cardiovascular: Positive for chest pain. Negative for palpitations.       Atypical days ago. None now.  Gastrointestinal: Positive for abdominal pain. Negative for blood in stool, nausea and vomiting.  Genitourinary: Negative for dysuria, flank pain, frequency and hematuria.  Musculoskeletal: Positive for back pain.  Skin: Negative for rash.  Neurological: Negative for dizziness, seizures, speech difficulty, weakness, light-headedness and headaches.       Trransient shaky on Friday when she had severe stomach and back pain.  Hematological: Negative for adenopathy. Does not bruise/bleed easily.  Psychiatric/Behavioral: Negative for behavioral problems, confusion, dysphoric mood and  hallucinations.    Past Medical History:  Diagnosis Date  . Allergy    SEASONAL  . Anemia   . Arthritis    "spine" (03/01/2014)  . Chronic lower back pain   . Endometriosis   . Erosive gastritis   . External hemorrhoids   . GERD (gastroesophageal reflux disease)   . H/O shortness of breath 2014   Cardiopulmonary exercise test results  . Headache    "probably monthly" (03/01/2014)  . Migraine 1998; 02/2014   "this one's lasted 10 days straight" (03/01/2014)  . OSA on CPAP   . Ovarian cyst, left   . Proctalgia fugax      Social History   Social History  . Marital status: Married    Spouse name: N/A  . Number of children: N/A  . Years of education: N/A   Occupational History  . Not on file.   Social History Main Topics  . Smoking status: Never Smoker  . Smokeless tobacco: Never Used     Comment: father smoked in house growing up per pt.   . Alcohol use 0.0 oz/week     Comment: 03/01/2014 "I'll have 1-2 drinks maybe 3-4 times/yr"  . Drug use: No  . Sexual activity: Yes    Birth control/ protection: Surgical   Other Topics Concern  . Not on file   Social History Narrative   Daily caffiene use: 6 cups per day   Patient does not get regular excercise    Past Surgical History:  Procedure Laterality Date  . ABDOMINAL  HYSTERECTOMY  04/2013  . BREAST BIOPSY Bilateral   . BREAST LUMPECTOMY Left ~ 1993  . CESAREAN SECTION  2002; 2006  . COLONOSCOPY    . LAPAROSCOPIC OVARIAN CYSTECTOMY  ~ 1999  . NASAL SEPTUM SURGERY  ~ 1991  . TEMPOROMANDIBULAR JOINT SURGERY  ~ 46  . UPPER GASTROINTESTINAL ENDOSCOPY      Family History  Problem Relation Age of Onset  . Breast cancer Maternal Grandmother   . Prostate cancer Paternal Grandfather   . Colon polyps Father   . Heart disease Father   . Colon cancer Neg Hx     Allergies  Allergen Reactions  . Promethazine Hcl Other (See Comments)    Violent tremors   . Cephalexin Diarrhea and Itching  . Codeine Other (See  Comments)    Was a child when she took this.  . Dilaudid [Hydromorphone] Nausea And Vomiting  . Azithromycin Rash    Head to toe rash    Current Outpatient Prescriptions on File Prior to Visit  Medication Sig Dispense Refill  . albuterol (PROVENTIL HFA;VENTOLIN HFA) 108 (90 BASE) MCG/ACT inhaler Inhale 2 puffs into the lungs every 6 (six) hours as needed for wheezing or shortness of breath. 1 Inhaler 3  . azelastine (ASTELIN) 0.1 % nasal spray Place 2 sprays into both nostrils 2 (two) times daily. Use in each nostril as directed 30 mL 3  . beclomethasone (QVAR) 40 MCG/ACT inhaler Inhale 2 puffs into the lungs 2 (two) times daily at 10 AM and 5 PM. 1 Inhaler 3  . conjugated estrogens (PREMARIN) vaginal cream Use one time per weekly at bedtime-one fourth applicator vaginally    . Cyanocobalamin (VITAMIN B-12) 1000 MCG SUBL Place under the tongue.    . cyclobenzaprine (FLEXERIL) 10 MG tablet Take 10 mg by mouth.    . diclofenac (VOLTAREN) 75 MG EC tablet Take 1 tablet (75 mg total) by mouth 2 (two) times daily. 30 tablet 1  . doxycycline (VIBRA-TABS) 100 MG tablet Take 1 tablet (100 mg total) by mouth 2 (two) times daily. 20 tablet 0  . fluticasone (FLONASE) 50 MCG/ACT nasal spray Place 2 sprays into both nostrils daily. 16 g 1  . guaiFENesin (MUCINEX) 600 MG 12 hr tablet Take 1,200 mg by mouth 2 (two) times daily.    . hydrOXYzine (ATARAX/VISTARIL) 10 MG tablet Take 1 tablet (10 mg total) by mouth 3 (three) times daily as needed for itching. 30 tablet 0  . lansoprazole (PREVACID) 30 MG capsule Take 30 mg by mouth daily at 12 noon.    . loratadine (CLARITIN) 10 MG tablet Take 1 tablet (10 mg total) by mouth daily. 30 tablet 0  . methocarbamol (ROBAXIN) 500 MG tablet Take 1 tablet (500 mg total) by mouth every 6 (six) hours as needed for muscle spasms. 20 tablet 0  . mometasone (NASONEX) 50 MCG/ACT nasal spray USE 2 SPRAYS IN EACH NOSTRIL EVERY DAY 17 g 1  . montelukast (SINGULAIR) 10 MG tablet  Take 1 tablet (10 mg total) by mouth at bedtime. 30 tablet 2  . mupirocin ointment (BACTROBAN) 2 % Place 1 application into the nose 2 (two) times daily. 22 g 0  . nitroGLYCERIN (NITRODUR - DOSED IN MG/24 HR) 0.2 mg/hr patch Apply 1/4th patch to affected area of ankle, change daily 30 patch 1  . oxybutynin (DITROPAN-XL) 10 MG 24 hr tablet Take 1 tablet (10 mg total) by mouth at bedtime. 30 tablet 0  . Probiotic Product (PROBIOTIC-10) CAPS Take by  mouth.    . ranitidine (ZANTAC) 150 MG capsule Take 1 capsule (150 mg total) by mouth 2 (two) times daily. 60 capsule 0  . sertraline (ZOLOFT) 50 MG tablet Take 50 mg by mouth.     No current facility-administered medications on file prior to visit.     BP 128/86 (BP Location: Right Arm, Cuff Size: Large)   Pulse 80   Temp 97.9 F (36.6 C) (Oral)   Resp 16   Ht 5' 4.5" (1.638 m)   Wt 222 lb 9.6 oz (101 kg)   LMP 02/08/2013   SpO2 100% Comment: room air  BMI 37.62 kg/m       Objective:   Physical Exam  General Mental Status- Alert. General Appearance- Not in acute distress.   Skin General: Color- Normal Color. Moisture- Normal Moisture. Temporal area looks normal.  Neck Carotid Arteries- Normal color. Moisture- Normal Moisture. No carotid bruits. No JVD.  Chest and Lung Exam Auscultation: Breath Sounds:-Normal.  Cardiovascular Auscultation:Rythm- Regular. Murmurs & Other Heart Sounds:Auscultation of the heart reveals- No Murmurs.  Abdomen Inspection:-Inspeection Normal. Palpation/Percussion:Note:No mass. Palpation and Percussion of the abdomen reveal- Non Tender, Non Distended + BS, no rebound or guarding.    Neurologic Cranial Nerve exam:- CN III-XII intact(No nystagmus), symmetric smile. Strength:- 5/5 equal and symmetric strength both upper and lower extremities.  Back- no cva tenderness.        Assessment & Plan:  For your recent epigastric pain will get cbc, cmp, amylase, lipase, and h pylori breath test. Use  prevacid daily and add back zantac twice daily. Eat healthy over next week as well. If pain reoccurs might refer to GI.  For atypical chest pain we got ekg today.(reviewed and normal sinus ryth) For recent thoracic back pain get xray.  If severe thoracic area pain again with chest pain then ED evaluation.  Due to temporal pain history with elevated sed rate will get sed rate.   Follow up 10-14 days or as needed  Shaheer Bonfield, Percell Miller, [employer]

## 2016-09-08 NOTE — Telephone Encounter (Signed)
Caller name: Shia  Relation to pt: self Call back number: [phone] Pharmacy:  Reason for call: Pt states was seen in our office last wk on 09-03-2016 for back issue and that pt was given a prescription for this situation and states had some issues during the wk end and does not know if it does have to do with medication given to her. Pt would like to speak with pcp to ask if it does have to with medication. Please advise ASAP.

## 2016-09-09 ENCOUNTER — Encounter: Payer: Self-pay | Admitting: *Deleted

## 2016-09-09 LAB — H. PYLORI BREATH TEST: H. pylori Breath Test: NOT DETECTED

## 2016-09-09 NOTE — Progress Notes (Signed)
Letter Mailed/SLS 04/03  

## 2016-09-09 NOTE — Telephone Encounter (Signed)
Lorraine Conrad-- this message was sent from pt after she viewed result comments in EPIC.  Please advise?

## 2016-09-17 ENCOUNTER — Encounter: Payer: Self-pay | Admitting: Medical

## 2016-09-17 ENCOUNTER — Ambulatory Visit (INDEPENDENT_AMBULATORY_CARE_PROVIDER_SITE_OTHER): Payer: BLUE CROSS/BLUE SHIELD | Admitting: Medical

## 2016-09-17 VITALS — BP 126/80 | HR 94 | Temp 98.4°F | Resp 16 | Ht 65.0 in | Wt 219.4 lb

## 2016-09-17 DIAGNOSIS — M5442 Lumbago with sciatica, left side: Secondary | ICD-10-CM

## 2016-09-17 DIAGNOSIS — R1013 Epigastric pain: Secondary | ICD-10-CM

## 2016-09-17 MED ORDER — METHOCARBAMOL 500 MG PO TABS: 500.0000 mg | ORAL_TABLET | Freq: Four times a day (QID) | ORAL | 0 refills | Status: DC | PRN

## 2016-09-17 MED ORDER — CYCLOBENZAPRINE HCL 10 MG PO TABS: 10.0000 mg | ORAL_TABLET | Freq: Three times a day (TID) | ORAL | 0 refills | Status: DC | PRN

## 2016-09-17 NOTE — Progress Notes (Signed)
Pre visit review using our clinic review tool, if applicable. No additional management support is needed unless otherwise documented below in the visit note. 
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
## 2016-09-17 NOTE — Patient Instructions (Signed)
For your epigastric pain I want you to continue the prevacid and ranitidine. Will also go ahead and refer you to gastroenterologist.  For your back pain will refer you to spine specialist. Continue the diclofenac. Will rx muscle relaxant to use at night.  Follow up in one month or as needed

## 2016-09-19 ENCOUNTER — Telehealth: Payer: Self-pay | Admitting: Medical

## 2016-09-19 NOTE — Telephone Encounter (Signed)
Please advise.//AB/CMA 

## 2016-09-19 NOTE — Telephone Encounter (Signed)
Caller name: Relationship to patient: Self Can be reached: [phone]  Pharmacy:  Reason for call: Patient request call back from provider Percell Miller) on Monday to find out if he can give her another Rx for her back and foot.

## 2016-09-22 MED ORDER — PREDNISONE 10 MG PO TABS: ORAL_TABLET | ORAL | 0 refills | Status: DC

## 2016-09-22 MED ORDER — TRAMADOL HCL 50 MG PO TABS: 50.0000 mg | ORAL_TABLET | Freq: Four times a day (QID) | ORAL | 0 refills | Status: DC | PRN

## 2016-09-22 NOTE — Telephone Encounter (Signed)
Called and spoke with the pt informed her that I will be faxing the new prescriptions to the pharmacy just needed to know which pharmacy.  Pt stated [facility].  Pt stated that she was told by Percell Miller to stop the diclofenac.  She said she has taking the diclofenac this morning between 9:30-10:00, so she wanted to know when she is to start the new medications.  Informed Percell Miller of the message from the pt and he stated that she can take the Tramadol tonight and start the Prednisone tomorrow.  Called the pt back and informed her of Edwards recommendation.  Pt verbalized understanding and agreed.  Received confirmation from the faxed prescriptions.//AB/CMA

## 2016-09-22 NOTE — Telephone Encounter (Signed)
For back pain that is persisting stop diclofenac. Start prednisone and rx tramadol. Rx advisement given. Reviewed prior allergies. No anaphylactic type reported to any pain meds.   Pt planning to leave on trip with husband tomorrow and she wants to be better/in less pain.  Please fax over prednisone and tramadol to her pharmacy.

## 2016-09-24 ENCOUNTER — Ambulatory Visit: Payer: BLUE CROSS/BLUE SHIELD | Admitting: Medical

## 2016-10-07 DIAGNOSIS — Z6838 Body mass index (BMI) 38.0-38.9, adult: Secondary | ICD-10-CM | POA: Diagnosis not present

## 2016-10-07 DIAGNOSIS — N939 Abnormal uterine and vaginal bleeding, unspecified: Secondary | ICD-10-CM | POA: Diagnosis not present

## 2016-10-08 ENCOUNTER — Encounter: Payer: Self-pay | Admitting: Medical

## 2016-10-17 ENCOUNTER — Ambulatory Visit: Payer: BLUE CROSS/BLUE SHIELD | Admitting: Medical

## 2016-10-20 ENCOUNTER — Encounter: Payer: Self-pay | Admitting: Medical

## 2016-10-23 ENCOUNTER — Telehealth: Payer: Self-pay | Admitting: Medical

## 2016-10-23 ENCOUNTER — Encounter: Payer: Self-pay | Admitting: Medical

## 2016-10-23 NOTE — Telephone Encounter (Signed)
Would you call pt and notify her that GI has been trying to call her but no answer. Give her there number please so she can call them

## 2016-10-23 NOTE — Telephone Encounter (Signed)
Letter routed to pt thru mychart to contact [facility] to schedule

## 2016-10-28 ENCOUNTER — Other Ambulatory Visit: Payer: Self-pay | Admitting: Obstetrics and Gynecology

## 2016-10-28 DIAGNOSIS — Z1231 Encounter for screening mammogram for malignant neoplasm of breast: Secondary | ICD-10-CM

## 2016-10-30 ENCOUNTER — Encounter: Payer: Self-pay | Admitting: Internal Medicine

## 2016-11-06 ENCOUNTER — Telehealth: Payer: Self-pay | Admitting: Medical

## 2016-11-06 DIAGNOSIS — M545 Low back pain: Secondary | ICD-10-CM

## 2016-11-06 NOTE — Telephone Encounter (Signed)
Please call pt [phone]

## 2016-11-06 NOTE — Telephone Encounter (Signed)
Pt needs a new referral for spine and scoliosis. It was closed out and they told her to call here and request another one.

## 2016-11-06 NOTE — Telephone Encounter (Signed)
fwding request to CMA for provider to review

## 2016-11-06 NOTE — Telephone Encounter (Signed)
Placed referral again to spine specialist.

## 2016-11-07 ENCOUNTER — Telehealth: Payer: Self-pay | Admitting: Medical

## 2016-11-07 MED ORDER — ALBUTEROL SULFATE HFA 108 (90 BASE) MCG/ACT IN AERS: 2.0000 | INHALATION_SPRAY | Freq: Four times a day (QID) | RESPIRATORY_TRACT | 3 refills | Status: DC | PRN

## 2016-11-07 NOTE — Telephone Encounter (Signed)
Caller name: Letzy Gullickson Relationship to patient: self Can be reached: [phone] Pharmacy: [facility]  Reason for call: Albuterol inhaler needs refilled. Hers is empty.

## 2016-11-07 NOTE — Telephone Encounter (Signed)
Notified pt rx sent to pharmacy.

## 2016-11-07 NOTE — Addendum Note (Signed)
Addended by: Hinton Dyer on: 11/07/2016 04:46 PM   Modules accepted: Orders

## 2016-11-12 ENCOUNTER — Encounter: Payer: Self-pay | Admitting: Medical

## 2016-11-18 ENCOUNTER — Encounter: Payer: Self-pay | Admitting: Medical

## 2016-11-24 ENCOUNTER — Ambulatory Visit
Admission: RE | Admit: 2016-11-24 | Discharge: 2016-11-24 | Disposition: A | Discharge status: Home / Self Care | Payer: BLUE CROSS/BLUE SHIELD | Source: Ambulatory Visit | Attending: Obstetrics and Gynecology | Admitting: Obstetrics and Gynecology

## 2016-11-24 DIAGNOSIS — Z1231 Encounter for screening mammogram for malignant neoplasm of breast: Secondary | ICD-10-CM | POA: Diagnosis not present

## 2016-12-09 DIAGNOSIS — Z6837 Body mass index (BMI) 37.0-37.9, adult: Secondary | ICD-10-CM | POA: Diagnosis not present

## 2016-12-09 DIAGNOSIS — M545 Low back pain: Secondary | ICD-10-CM | POA: Diagnosis not present

## 2016-12-09 DIAGNOSIS — M549 Dorsalgia, unspecified: Secondary | ICD-10-CM | POA: Diagnosis not present

## 2016-12-12 ENCOUNTER — Telehealth: Payer: Self-pay | Admitting: Medical

## 2016-12-12 NOTE — Telephone Encounter (Signed)
Relation to pt: self Call back number: [phone]    Reason for call:  [facility]  Telephone # [phone] ext Brooke advised patient to contact PCP requesting orders for "Auto Titration for 2 weeks to monitor c-pap pressure to determine what set the cpap should be on" requesting orders fax to [phone]

## 2016-12-16 ENCOUNTER — Telehealth: Payer: Self-pay | Admitting: Medical

## 2016-12-16 DIAGNOSIS — J45909 Unspecified asthma, uncomplicated: Secondary | ICD-10-CM | POA: Diagnosis not present

## 2016-12-16 DIAGNOSIS — J4520 Mild intermittent asthma, uncomplicated: Secondary | ICD-10-CM | POA: Diagnosis not present

## 2016-12-16 DIAGNOSIS — R768 Other specified abnormal immunological findings in serum: Secondary | ICD-10-CM | POA: Diagnosis not present

## 2016-12-16 NOTE — Telephone Encounter (Signed)
Error

## 2016-12-16 NOTE — Telephone Encounter (Signed)
Pt has been using cpap for years. Pt states [facility] told her it time for her to have a Auto Titration to make sure she's on the right setting.

## 2016-12-16 NOTE — Telephone Encounter (Signed)
Pt has been using cpap for years. Pt states [facility] told her it time for her to have a Auto Titration to make sure she's on the right setting.  This is note that I got from pt and [facility]. I have seen her for a while and on review of my notes did not see sleep apnea dx. Also reviewed pulmonologist notes and did not see them mention. Maybe I am not seeing note. But did find dx on problem list. Who has bee signing off on the orders in the past?  Will you call [facility] and ask them this. I don't see sleep study in epic? I usually don't write orders for supplies or know how to titrate. I usually leave all that up to pulmonologist.   If they give me advise and sign off on instructions would like to have sleep study scanned into epic.  Would you call [facility] and get guidance from them.  Let me know what they say.

## 2016-12-17 ENCOUNTER — Telehealth: Payer: Self-pay | Admitting: Medical

## 2016-12-17 NOTE — Telephone Encounter (Signed)
Pt called in to schedule an apt. She said that sometimes she have difficulty swallowing. I scheduled pt an apt with PCP per her request on date and time requested. ALSO, transferred pt to [facility] for triaging due to concern.

## 2016-12-17 NOTE — Telephone Encounter (Signed)
[facility] - Client TELEPHONE ADVICE RECORD [facility] Patient Name: Lorraine Conrad DOB: 05/12/1971 Initial Comment Caller has difficulty swallowing, has an appt Friday. Nurse Assessment Nurse: Martyn Ehrich, RN, Felicia Date/Time (Eastern Time): 12/17/2016 4:54:00 PM Confirm and document reason for call. If symptomatic, describe symptoms. ---She has episodes where she has trouble swallowing for over a month. This time it started today and she had this Friday. When she is eating all of a sudden her food got stuck at top of throat. It eventually went down. She started seeing stars. THen recovered - she did not faint Does the patient have any new or worsening symptoms? ---Yes Will a triage be completed? ---Yes Related visit to physician within the last 2 weeks? ---No Does the PT have any chronic conditions? (i.e. diabetes, asthma, etc.) ---YesList chronic conditions. ---asthma Is the patient pregnant or possibly pregnant? (Ask all females between the ages of 29-55) ---No Is this a behavioral health or substance abuse call? ---No Guidelines Guideline Title Affirmed Question Affirmed Notes Swallowing Difficulty Symptoms of food or bone stuck in throat or esophagus (e.g., pain in throat or chest, FB sensation, blood-tinged saliva) Final Disposition User Go to ED Now Martyn Ehrich, RN, Solmon Ice Comments PATIENT WOULD LIKE A CALL BACK FROM THE OFFICE BC SHE DECLINED TO GO TO ER Referrals GO TO FACILITY REFUSED Disagree/Comply: Comply Call Id: [id number]

## 2016-12-18 ENCOUNTER — Telehealth: Payer: Self-pay | Admitting: Gastroenterology

## 2016-12-18 NOTE — Telephone Encounter (Signed)
Called [facility].  Resp. Has left for the day.  Was advised to call back tomorrow using the following number  [phone] ext [phone].

## 2016-12-18 NOTE — Telephone Encounter (Signed)
Patient reports that she is having dysphagia. Patient will avoid meat and bread and stay on a soft diet until OV on 12/23/16 1:15 with Ellouise Newer, PA

## 2016-12-19 ENCOUNTER — Ambulatory Visit: Payer: BLUE CROSS/BLUE SHIELD | Admitting: Medical

## 2016-12-19 NOTE — Telephone Encounter (Signed)
Called [facility]. They do have copy of sleep study on file.  They will fax [place] a copy so that we may put it in epic.  They just need an order so that auto titration can be done to ensure appropriate settings on CPAP.  Order can be faxed to [phone].

## 2016-12-19 NOTE — Telephone Encounter (Signed)
Sleep study received, numbered and placed in Edward's red folder for review.

## 2016-12-22 ENCOUNTER — Telehealth: Payer: Self-pay | Admitting: Medical

## 2016-12-22 DIAGNOSIS — G4733 Obstructive sleep apnea (adult) (pediatric): Secondary | ICD-10-CM

## 2016-12-22 NOTE — Telephone Encounter (Signed)
Order signed for auto titration study. Ashlee RN will fax over.

## 2016-12-22 NOTE — Telephone Encounter (Signed)
I can write the auto titration order. Pt saw MD at [facility].   My concern is that I typically don't write the order for sleep study supplies or machine setting. If they would make adjustment that would be ok. Otherwise I usually leave it up to pulmonologist.

## 2016-12-22 NOTE — Telephone Encounter (Signed)
Noted. Ordered written per verbal order and placed on Edward's ledge for signature.  Will fax back and inquire about who will make the appropriate adjustments.

## 2016-12-23 ENCOUNTER — Encounter: Payer: Self-pay | Admitting: Physician Assistant

## 2016-12-23 ENCOUNTER — Encounter: Payer: Self-pay | Admitting: Medical

## 2016-12-23 ENCOUNTER — Telehealth: Payer: Self-pay | Admitting: Medical

## 2016-12-23 ENCOUNTER — Telehealth: Payer: Self-pay | Admitting: *Deleted

## 2016-12-23 ENCOUNTER — Telehealth: Payer: Self-pay | Admitting: Physician Assistant

## 2016-12-23 ENCOUNTER — Ambulatory Visit (INDEPENDENT_AMBULATORY_CARE_PROVIDER_SITE_OTHER): Payer: BLUE CROSS/BLUE SHIELD | Admitting: Physician Assistant

## 2016-12-23 ENCOUNTER — Telehealth: Payer: Self-pay | Admitting: Internal Medicine

## 2016-12-23 VITALS — BP 108/68 | HR 84 | Ht 64.5 in | Wt 219.0 lb

## 2016-12-23 DIAGNOSIS — R1314 Dysphagia, pharyngoesophageal phase: Secondary | ICD-10-CM | POA: Diagnosis not present

## 2016-12-23 DIAGNOSIS — R1013 Epigastric pain: Secondary | ICD-10-CM | POA: Diagnosis not present

## 2016-12-23 DIAGNOSIS — K921 Melena: Secondary | ICD-10-CM

## 2016-12-23 NOTE — Telephone Encounter (Signed)
Tried to call patient on the cell number we have. Goes into a fast busy. Called home number but it rings continually.  Katina Degree RN called [facility]) to see if they had a sooner date for the UGI and Barium Swallow. We scheduled the tests  for Tuesday 12-30-16. They had Monday 12-29-2016.  Trying to let the patient know.  We did not schedule her at [facility] yet.

## 2016-12-23 NOTE — Telephone Encounter (Signed)
Noted.  Order printed and forwarded to PCP for review and signature.

## 2016-12-23 NOTE — Telephone Encounter (Signed)
Got another request from [facility]. Wanting order to include pressure setting. Please see that note. Will you reprint the order. I will sign it tomorrow.

## 2016-12-23 NOTE — Telephone Encounter (Signed)
Form that patient dropped off is in MR's look at, ATC pt x1 to inform her that MR is on vacation until next week but vm has not been set up yet.  Called [facility] spoke with meredith and she states the patient came to their office and stated that she felt like her pressure was not enough and they suggested that she try a cpap with a titration pressure for 2 weeks to see how she adjusted to it but an order needs to be signed by physician. Pt has a set pressure of 6 currently

## 2016-12-23 NOTE — Patient Instructions (Signed)
Take Prevacid 30 mg, 1 tablet every day. . Continue Zantac.   You have been scheduled for a Barium Esophogram  And Upper GI Test at [facility] (1st floor of the hospital) on Tuesday 12-30-2016  at 9:00 am. Please arrive at 8:45 am to your appointment for registration. Make certain not to have anything to eat or drink 6 hours prior to your test. If you need to reschedule for any reason, please contact radiology at [phone] to do so. __________________________________________________________________ A barium swallow is an examination that concentrates on views of the esophagus. This tends to be a double contrast exam (barium and two liquids which, when combined, create a gas to distend the wall of the oesophagus) or single contrast (non-ionic iodine based). The study is usually tailored to your symptoms so a good history is essential. Attention is paid during the study to the form, structure and configuration of the esophagus, looking for functional disorders (such as aspiration, dysphagia, achalasia, motility and reflux) EXAMINATION You may be asked to change into a gown, depending on the type of swallow being performed. A radiologist and radiographer will perform the procedure. The radiologist will advise you of the type of contrast selected for your procedure and direct you during the exam. You will be asked to stand, sit or lie in several different positions and to hold a small amount of fluid in your mouth before being asked to swallow while the imaging is performed .In some instances you may be asked to swallow barium coated marshmallows to assess the motility of a solid food bolus. The exam can be recorded as a digital or video fluoroscopy procedure. POST PROCEDURE It will take 1-2 days for the barium to pass through your system. To facilitate this, it is important, unless otherwise directed, to increase your fluids for the next 24-48hrs and to resume your normal diet.  This test  typically takes about 30 minutes to perform.  ____________________________ An upper GI series uses x rays to help diagnose problems of the upper GI tract, which includes the esophagus, stomach, and duodenum. The duodenum is the first part of the small intestine. An upper GI series is conducted by a radiology technologist or a radiologist-a doctor who specializes in x-ray imaging-at a hospital or outpatient center. While sitting or standing in front of an x-ray machine, the patient drinks barium liquid, which is often white and has a chalky consistency and taste. The barium liquid coats the lining of the upper GI tract and makes signs of disease show up more clearly on x rays. X-ray video, called fluoroscopy, is used to view the barium liquid moving through the esophagus, stomach, and duodenum. Additional x rays and fluoroscopy are performed while the patient lies on an x-ray table. To fully coat the upper GI tract with barium liquid, the technologist or radiologist may press on the abdomen or ask the patient to change position. Patients hold still in various positions, allowing the technologist or radiologist to take x rays of the upper GI tract at different angles. If a technologist conducts the upper GI series, a radiologist will later examine the images to look for problems.  This test typically takes about 1 hour to complete. __________________________________________________________________ __________________________________________________________________________________

## 2016-12-23 NOTE — Telephone Encounter (Signed)
Signed order faxed to [facility] at [phone].  Fax confirmation received.  There are Resp Therapist are each [facility] location that will run auto titration test and make recommendations on settings.

## 2016-12-23 NOTE — Telephone Encounter (Signed)
Caller name: Barbaraann Rondo  Relation to pt: referral coordinator from [facility]  Call back number: [phone] ext [phone]    Reason for call:  Requesting new order reflecting "pressure setting" please advise

## 2016-12-23 NOTE — Telephone Encounter (Signed)
New order written and forwarded to PCP for review and signature.  Once signed, please fax to [facility] at [phone].

## 2016-12-23 NOTE — Telephone Encounter (Signed)
I have tried to reach the pt to advise her [place] has next Monday, that is the soonest.  Her phone number is going to a fast busy and the home phone is just ringing no answering machine

## 2016-12-23 NOTE — Telephone Encounter (Signed)
Spoke to [place].  Order needs to reflect pressure setting 4-20 for autotitration.  New order written to reflect the same.

## 2016-12-23 NOTE — Progress Notes (Signed)
Chief Complaint: Dysphagia  HPI:  Lorraine Conrad is a 46 year old female with a past medical history as listed below, who follows with Dr. Fuller Plan and was referred to me by Mackie Pai, PA-C for a complaint of dysphagia .      Patient's last EGD was completed 02/21/13 with mild duodenitis in the bulb.    Today, the patient presents to clinic and explains that she has had a feeling of solid foods getting "hung up" in her throat for the past month or two. She describes this does not occur every time she eats but has been increasing in frequency. Typically, she will regurgitate the solid material and the feeling will go away. Patient does tell me that occasionally food gets stuck and she "will see stars", this is associated with esophageal discomfort.   Patient also describes an epigastric discomfort today which is worse when she is using her Diclofenac 75 mg daily. She describes this as a quarter size spot just to the left of her umbilicus which feels as though it is burning. She has stopped her Diclofenac and this does seem somewhat better. Patient does tell me she has occasional reflux and is not "as good about taking her medicine as I should be". She is prescribed Prevacid 30 mg once daily and Zantac 75 mg twice daily. Typically she takes Prevacid but forgets the Zantac.   Patient also describes seeing some bright red blood when wiping on the toilet paper, in her stool and in the toilet bowl, this occurs "off and on". This has been going on for years and has not changed recently. Over the past week she has seen a few more episodes of blood than before. She does believe she had a prior colonoscopy but is uncertain when this was. She denies any rectal pain or change in her bowel habits, noting that "I'm always irregular". It can vary from diarrhea to constipation.   Patient denies fever, chills, melena, change in bowel habits, weight loss, fatigue, anorexia, nausea, vomiting or symptoms that awaken her at  night.  Past Medical History:  Diagnosis Date  . Allergy    SEASONAL  . Anemia   . Arthritis    "spine" (03/01/2014)  . Chronic lower back pain   . Endometriosis   . Erosive gastritis   . External hemorrhoids   . GERD (gastroesophageal reflux disease)   . H/O shortness of breath 2014   Cardiopulmonary exercise test results  . Headache    "probably monthly" (03/01/2014)  . Migraine 1998; 02/2014   "this one's lasted 10 days straight" (03/01/2014)  . OSA on CPAP   . Ovarian cyst, left   . Proctalgia fugax     Past Surgical History:  Procedure Laterality Date  . ABDOMINAL HYSTERECTOMY  04/2013  . BREAST BIOPSY Bilateral 10/05/2000  . BREAST EXCISIONAL BIOPSY Left 1991  . BREAST LUMPECTOMY Left ~ 1993  . CESAREAN SECTION  2002; 2006  . COLONOSCOPY    . LAPAROSCOPIC OVARIAN CYSTECTOMY  ~ 1999  . NASAL SEPTUM SURGERY  ~ 1991  . TEMPOROMANDIBULAR JOINT SURGERY  ~ 50  . UPPER GASTROINTESTINAL ENDOSCOPY      Current Outpatient Prescriptions  Medication Sig Dispense Refill  . albuterol (PROVENTIL HFA;VENTOLIN HFA) 108 (90 Base) MCG/ACT inhaler Inhale 2 puffs into the lungs every 6 (six) hours as needed for wheezing or shortness of breath. 1 Inhaler 3  . azelastine (ASTELIN) 0.1 % nasal spray Place 2 sprays into both nostrils 2 (two) times  daily. Use in each nostril as directed 30 mL 3  . beclomethasone (QVAR) 40 MCG/ACT inhaler Inhale 2 puffs into the lungs 2 (two) times daily at 10 AM and 5 PM. 1 Inhaler 3  . conjugated estrogens (PREMARIN) vaginal cream Use one time per weekly at bedtime-one fourth applicator vaginally    . Cyanocobalamin (VITAMIN B-12) 1000 MCG SUBL Place under the tongue.    . cyclobenzaprine (FLEXERIL) 10 MG tablet Take 10 mg by mouth.    . diclofenac (VOLTAREN) 75 MG EC tablet Take 1 tablet (75 mg total) by mouth 2 (two) times daily. 30 tablet 1  . fluticasone (FLONASE) 50 MCG/ACT nasal spray Place 2 sprays into both nostrils daily. 16 g 1  . guaiFENesin  (MUCINEX) 600 MG 12 hr tablet Take 1,200 mg by mouth 2 (two) times daily.    . hydrOXYzine (ATARAX/VISTARIL) 10 MG tablet Take 1 tablet (10 mg total) by mouth 3 (three) times daily as needed for itching. 30 tablet 0  . lansoprazole (PREVACID) 30 MG capsule Take 30 mg by mouth daily at 12 noon.    . loratadine (CLARITIN) 10 MG tablet Take 1 tablet (10 mg total) by mouth daily. 30 tablet 0  . methocarbamol (ROBAXIN) 500 MG tablet Take 1 tablet (500 mg total) by mouth every 6 (six) hours as needed for muscle spasms. 20 tablet 0  . mometasone (NASONEX) 50 MCG/ACT nasal spray USE 2 SPRAYS IN EACH NOSTRIL EVERY DAY 17 g 1  . montelukast (SINGULAIR) 10 MG tablet Take 1 tablet (10 mg total) by mouth at bedtime. 30 tablet 2  . mupirocin ointment (BACTROBAN) 2 % Place 1 application into the nose 2 (two) times daily. 22 g 0  . nitroGLYCERIN (NITRODUR - DOSED IN MG/24 HR) 0.2 mg/hr patch Apply 1/4th patch to affected area of ankle, change daily 30 patch 1  . oxybutynin (DITROPAN-XL) 10 MG 24 hr tablet Take 1 tablet (10 mg total) by mouth at bedtime. 30 tablet 0  . predniSONE (DELTASONE) 10 MG tablet 5 TAB PO DAY 1 4 TAB PO DAY 2 3 TAB PO DAY 3 2 TAB PO DAY 4 1 TAB PO DAY 5 15 tablet 0  . Probiotic Product (PROBIOTIC-10) CAPS Take by mouth.    . ranitidine (ZANTAC) 150 MG capsule Take 1 capsule (150 mg total) by mouth 2 (two) times daily. 60 capsule 0  . sertraline (ZOLOFT) 50 MG tablet Take 50 mg by mouth.    . traMADol (ULTRAM) 50 MG tablet Take 1 tablet (50 mg total) by mouth every 6 (six) hours as needed for severe pain. 30 tablet 0   No current facility-administered medications for this visit.     Allergies as of 12/23/2016 - Review Complete 09/17/2016  Allergen Reaction Noted  . Promethazine hcl Other (See Comments) 11/15/2007  . Cephalexin Diarrhea and Itching 02/13/2015  . Codeine Other (See Comments) 11/15/2007  . Dilaudid [hydromorphone] Nausea And Vomiting 02/24/2014  . Azithromycin Rash  07/31/2011    Family History  Problem Relation Age of Onset  . Breast cancer Maternal Grandmother   . Prostate cancer Paternal Grandfather   . Colon polyps Father   . Heart disease Father   . Colon cancer Neg Hx     Social History   Social History  . Marital status: Married    Spouse name: N/A  . Number of children: N/A  . Years of education: N/A   Occupational History  . Not on file.   Social History  Main Topics  . Smoking status: Never Smoker  . Smokeless tobacco: Never Used     Comment: father smoked in house growing up per pt.   . Alcohol use 0.0 oz/week     Comment: 03/01/2014 "I'll have 1-2 drinks maybe 3-4 times/yr"  . Drug use: No  . Sexual activity: Yes    Birth control/ protection: Surgical   Other Topics Concern  . Not on file   Social History Narrative   Daily caffiene use: 6 cups per day   Patient does not get regular excercise    Review of Systems:    Constitutional: No weight loss, fever or chills Skin: No rash  Cardiovascular: No chest pain Respiratory: No SOB  Gastrointestinal: See HPI and otherwise negative Genitourinary: No dysuria Neurological: No headache Musculoskeletal: No new muscle or joint pain Hematologic: No bleeding or bruising Psychiatric: No history of depression or anxiety   Physical Exam:  Vital signs: BP 108/68   Pulse 84   Ht 5' 4.5" (1.638 m)   Wt 219 lb (99.3 kg)   LMP 02/08/2013   BMI 37.01 kg/m    Constitutional:   Pleasant Caucasian female appears to be in NAD, Well developed, Well nourished, alert and cooperative Head:  Normocephalic and atraumatic. Eyes:   PEERL, EOMI. No icterus. Conjunctiva pink. Ears:  Normal auditory acuity. Neck:  Supple Throat: Oral cavity and pharynx without inflammation, swelling or lesion.  Respiratory: Respirations even and unlabored. Lungs clear to auscultation bilaterally.   No wheezes, crackles, or rhonchi.  Cardiovascular: Normal S1, S2. No MRG. Regular rate and rhythm. No  peripheral edema, cyanosis or pallor.  Gastrointestinal:  Soft, nondistended, nontender. No rebound or guarding. Normal bowel sounds. No appreciable masses or hepatomegaly. Rectal:  Not performed.  Msk:  Symmetrical without gross deformities. Without edema, no deformity or joint abnormality.  Neurologic:  Alert and  oriented x4;  grossly normal neurologically.  Skin:   Dry and intact without significant lesions or rashes. Psychiatric:  Demonstrates good judgement and reason without abnormal affect or behaviors.  MOST RECENT LABS AND IMAGING: CBC    Component Value Date/Time   WBC 8.1 09/08/2016 1138   RBC 4.80 09/08/2016 1138   HGB 13.3 09/08/2016 1138   HCT 39.7 09/08/2016 1138   PLT 396.0 09/08/2016 1138   MCV 82.6 09/08/2016 1138   MCH 28.1 08/23/2015 1300   MCHC 33.4 09/08/2016 1138   RDW 13.8 09/08/2016 1138   LYMPHSABS 2.0 09/08/2016 1138   MONOABS 0.6 09/08/2016 1138   EOSABS 0.3 09/08/2016 1138   BASOSABS 0.0 09/08/2016 1138    CMP     Component Value Date/Time   NA 140 09/08/2016 1138   K 4.5 09/08/2016 1138   CL 105 09/08/2016 1138   CO2 31 09/08/2016 1138   GLUCOSE 93 09/08/2016 1138   BUN 13 09/08/2016 1138   CREATININE 1.00 09/08/2016 1138   CREATININE 0.95 03/18/2013 0849   CALCIUM 9.6 09/08/2016 1138   PROT 7.7 09/08/2016 1138   ALBUMIN 4.3 09/08/2016 1138   AST 19 09/08/2016 1138   ALT 37 (H) 09/08/2016 1138   ALKPHOS 82 09/08/2016 1138   BILITOT 0.4 09/08/2016 1138   GFRNONAA >60 08/23/2015 1300   GFRAA >60 08/23/2015 1300    Assessment: 1. Dysphagia:Recent increase over the past 2 months, episodes of solid food getting stuck in her throat which she has to regurgitate, does have some reflux symptoms, not taking medication as prescribed; consider esophageal stricture versus ring versus web  versus mass versus other 2. Epigastric Pain: Patient with a "burning" quarter-sized pain in her upper abdomen seems to correlate with taking Diclofenac; consider  gastritis versus PUD versus other 3. Hematochezia: Occasional bright red blood with bowel movements, patient does vary between diarrhea and constipation due to her previously diagnosed IBS; consider relation hemorrhoids versus other  Plan: 1. Ordered a barium esophagogram with tablet for the patient. If this shows structural abnormality will recommend an EGD with Dr. Fuller Plan in the [facility] 2. Reviewed anti-dysphagia measures including taking small bites, chewing well, qvoiding distraction while eating and the chin-tuck technique. 3. Patient will need colonoscopy at time of EGD above due to recent rectal bleeding. 4. Patient to continue her Pepcid 30 mg daily and Zantac when necessary 5. Patient to follow in clinic after imaging above, she will call if she has an increase in bleeding or symptoms before that time  Ellouise Newer, PA-C [facility] 12/23/2016, 1:18 PM  Cc: Mackie Pai, PA-C

## 2016-12-23 NOTE — Progress Notes (Signed)
Reviewed and agree with initial management plan.  Malcolm T. Stark, MD FACG 

## 2016-12-23 NOTE — Addendum Note (Signed)
Addended by: Rudene Anda on: 12/23/2016 04:08 PM   Modules accepted: Orders

## 2016-12-23 NOTE — Telephone Encounter (Signed)
Ashlee printed new order for auto titration. I think she gave that order to you. Help remind me to sign and then fax over.  Thanks.

## 2016-12-23 NOTE — Telephone Encounter (Addendum)
error:[id number] ° °

## 2016-12-23 NOTE — Addendum Note (Signed)
Addended by: Rudene Anda on: 12/23/2016 05:12 PM   Modules accepted: Orders

## 2016-12-24 NOTE — Telephone Encounter (Signed)
Done

## 2016-12-24 NOTE — Telephone Encounter (Signed)
Order signed and faxed.

## 2016-12-24 NOTE — Telephone Encounter (Signed)
[facility]. [phone]

## 2016-12-24 NOTE — Telephone Encounter (Signed)
Attempted to contact pt. No answer, no option to leave a message due to the pt's voicemail not being set up. Will try back.

## 2016-12-25 ENCOUNTER — Ambulatory Visit: Payer: BLUE CROSS/BLUE SHIELD | Admitting: Physician Assistant

## 2016-12-25 NOTE — Telephone Encounter (Signed)
MR please advise thanks  

## 2016-12-26 NOTE — Telephone Encounter (Signed)
MR has never managed pt's CPAP. Looking through pt's chart it shows pt's PCP ordered the auto titration - see phone note from 7.16.18.   ATC pt to inform her.

## 2016-12-29 DIAGNOSIS — M5412 Radiculopathy, cervical region: Secondary | ICD-10-CM | POA: Insufficient documentation

## 2016-12-29 DIAGNOSIS — M5416 Radiculopathy, lumbar region: Secondary | ICD-10-CM | POA: Insufficient documentation

## 2016-12-29 DIAGNOSIS — M542 Cervicalgia: Secondary | ICD-10-CM | POA: Diagnosis not present

## 2016-12-29 DIAGNOSIS — M545 Low back pain: Secondary | ICD-10-CM | POA: Diagnosis not present

## 2016-12-29 DIAGNOSIS — M546 Pain in thoracic spine: Secondary | ICD-10-CM | POA: Diagnosis not present

## 2016-12-29 DIAGNOSIS — R29898 Other symptoms and signs involving the musculoskeletal system: Secondary | ICD-10-CM | POA: Diagnosis not present

## 2016-12-29 DIAGNOSIS — R531 Weakness: Secondary | ICD-10-CM | POA: Diagnosis not present

## 2016-12-29 NOTE — Telephone Encounter (Signed)
ATC pt, VM box is full. WCB. 

## 2016-12-30 ENCOUNTER — Telehealth: Payer: Self-pay | Admitting: Physician Assistant

## 2016-12-30 ENCOUNTER — Ambulatory Visit (HOSPITAL_COMMUNITY)
Admission: RE | Admit: 2016-12-30 | Discharge: 2016-12-30 | Disposition: A | Discharge status: Home / Self Care | Payer: BLUE CROSS/BLUE SHIELD | Source: Ambulatory Visit | Attending: Physician Assistant | Admitting: Physician Assistant

## 2016-12-30 ENCOUNTER — Inpatient Hospital Stay (HOSPITAL_COMMUNITY): Admission: RE | Admit: 2016-12-30 | Payer: BLUE CROSS/BLUE SHIELD | Source: Ambulatory Visit

## 2016-12-30 DIAGNOSIS — R1013 Epigastric pain: Secondary | ICD-10-CM

## 2016-12-30 DIAGNOSIS — F458 Other somatoform disorders: Secondary | ICD-10-CM | POA: Insufficient documentation

## 2016-12-30 DIAGNOSIS — K921 Melena: Secondary | ICD-10-CM | POA: Diagnosis present

## 2016-12-30 DIAGNOSIS — K449 Diaphragmatic hernia without obstruction or gangrene: Secondary | ICD-10-CM | POA: Insufficient documentation

## 2016-12-30 DIAGNOSIS — R1314 Dysphagia, pharyngoesophageal phase: Secondary | ICD-10-CM

## 2016-12-30 DIAGNOSIS — K224 Dyskinesia of esophagus: Secondary | ICD-10-CM | POA: Diagnosis not present

## 2016-12-30 NOTE — Telephone Encounter (Signed)
Pt returned call. Informed her of the recs per MR. Appt made with RA in [place] on 01/01/17. Pt aware to bring CPAP for download. Called and LM for Melissa with [facility] to have her fax over the pt's sleep study. Lorraine Conrad is aware to keep eye out for sleep study.

## 2016-12-30 NOTE — Telephone Encounter (Signed)
The pt has been advised and scheduled for a pre visit and EGD

## 2016-12-30 NOTE — Telephone Encounter (Signed)
Patient returning Lorraine Conrad's call about results from swallow test today 7.24.18

## 2016-12-30 NOTE — Telephone Encounter (Signed)
PCP to manage CPAP or refer to a sleep doc  Dr. Brand Males, M.D., [facility].C.P Pulmonary and Critical Care Medicine Staff Physician [facility] Pager: [phone], If no answer or between  15:00h - 7:00h: call [phone]  12/30/2016 8:22 AM

## 2016-12-30 NOTE — Telephone Encounter (Signed)
ATC pt- unable to leave vm, due to mailbox being full. Will call back.  

## 2016-12-30 NOTE — Telephone Encounter (Signed)
Result Notes   Notes recorded by Jeoffrey Massed, RN on 12/30/2016 at 2:39 PM EDT See phone note ------  Notes recorded by Jeoffrey Massed, RN on 12/30/2016 at 2:28 PM EDT The pt mail box is full. Will try later ------  Notes recorded by Levin Erp, PA on 12/30/2016 at 1:25 PM EDT Please schedule for EGD with Dr. Fuller Plan in [facility]. ThankS-JLL ------  Notes recorded by Levin Erp, PA on 12/30/2016 at 1:06 PM EDT UGI shows some possible esophagitis and dysmotility, are you ok with proceeding with EGD for further eval? Thanks-JLL

## 2016-12-31 ENCOUNTER — Telehealth: Payer: Self-pay | Admitting: Physician Assistant

## 2016-12-31 NOTE — Telephone Encounter (Signed)
The pt has been added to the schedule for EGD and Colon per office note. Pt aware and will keep pre visit as scheduled

## 2017-01-01 ENCOUNTER — Encounter: Payer: Self-pay | Admitting: Pulmonary Disease

## 2017-01-01 ENCOUNTER — Ambulatory Visit (INDEPENDENT_AMBULATORY_CARE_PROVIDER_SITE_OTHER): Payer: BLUE CROSS/BLUE SHIELD | Admitting: Pulmonary Disease

## 2017-01-01 DIAGNOSIS — G4733 Obstructive sleep apnea (adult) (pediatric): Secondary | ICD-10-CM

## 2017-01-01 DIAGNOSIS — N939 Abnormal uterine and vaginal bleeding, unspecified: Secondary | ICD-10-CM | POA: Diagnosis not present

## 2017-01-01 DIAGNOSIS — Z6838 Body mass index (BMI) 38.0-38.9, adult: Secondary | ICD-10-CM | POA: Diagnosis not present

## 2017-01-01 NOTE — Telephone Encounter (Signed)
Spoke with Cherina, sleep study has been received Nothing further needed at this time; will sign off.

## 2017-01-01 NOTE — Patient Instructions (Addendum)
You will need pressure adjusted on your CPAP machine.  place on auto CPAP settings 5-10 cm and obtain download in 2 weeks   CPAP supplies will be renewed for a year. You have enlarged tonsils - may need ENT evaluation

## 2017-01-01 NOTE — Progress Notes (Signed)
Subjective:    Patient ID: Lorraine Conrad, female    DOB: 1970-11-24, 46 y.o.   MRN: [id number]  HPI  46 year old woman referred for evaluation of sleep disordered breathing. She has been evaluated for dyspnea, extensive cardiac evaluation was negative and has been attributed to obesity and deconditioning   She underwent overnight polysomnogram in 11/2008 due to symptoms of excessive daytime somnolence and loud snoring, AHI was 6/hour, her weight then was 188 pounds. A subsequent CPAP titration study this was corrected by CPAP of 6 cm with nasal mask. She has been maintained on these settings since then with good improvement in her daytime somnolence and fatigue and snoring has been abolished. She still reports occasional tiredness. She has undergone recent evaluation for dysphagia and found to have primary esophageal dysmotility and EGD is planned. She wonders if CPAP is making her symptoms worse  Epworth sleepiness score is 8 Bedtime is around 11 PM, sleep latency is minimal, she sleeps with her head of bed elevated on one pillow, reports occasional nocturnal awakening including nocturia. Since her hysterectomy she has been getting really sound sleep. She has gained about 34 pounds in the last 7 years to her current weight of 220 pounds. She denies excessive use of caffeinated beverages  There is no history suggestive of cataplexy, sleep paralysis or parasomnias     Past Medical History:  Diagnosis Date  . Allergy    SEASONAL  . Anemia   . Arthritis    "spine" (03/01/2014)  . Chronic lower back pain   . Endometriosis   . Erosive gastritis   . External hemorrhoids   . GERD (gastroesophageal reflux disease)   . H/O shortness of breath 2014   Cardiopulmonary exercise test results  . Headache    "probably monthly" (03/01/2014)  . Migraine 1998; 02/2014   "this one's lasted 10 days straight" (03/01/2014)  . OSA on CPAP   . Ovarian cyst, left   . Proctalgia fugax      Past  Surgical History:  Procedure Laterality Date  . ABDOMINAL HYSTERECTOMY  04/2013  . BREAST BIOPSY Bilateral 10/05/2000  . BREAST EXCISIONAL BIOPSY Left 1991  . BREAST LUMPECTOMY Left ~ 1993  . CESAREAN SECTION  2002; 2006  . COLONOSCOPY    . LAPAROSCOPIC OVARIAN CYSTECTOMY  ~ 1999  . NASAL SEPTUM SURGERY  ~ 1991  . OOPHORECTOMY Right 2015  . TEMPOROMANDIBULAR JOINT SURGERY  ~ 2  . UPPER GASTROINTESTINAL ENDOSCOPY        Allergies  Allergen Reactions  . Promethazine Hcl Other (See Comments)    Violent tremors   . Cephalexin Diarrhea and Itching  . Codeine Other (See Comments)    Was a child when she took this.  . Dilaudid [Hydromorphone] Nausea And Vomiting  . Azithromycin Rash    Head to toe rash    Social History   Social History  . Marital status: Married    Spouse name: N/A  . Number of children: N/A  . Years of education: N/A   Occupational History  . Not on file.   Social History Main Topics  . Smoking status: Never Smoker  . Smokeless tobacco: Never Used     Comment: father smoked in house growing up per pt.   . Alcohol use 0.0 oz/week     Comment: 12-23-16 "I'll have 1-2 drinks maybe 3-4 times/yr"  . Drug use: No  . Sexual activity: Yes    Birth control/ protection: Surgical   Other Topics  Concern  . Not on file   Social History Narrative   Daily caffiene use: 6 cups per day   Patient does not get regular excercise     Family History  Problem Relation Age of Onset  . Breast cancer Maternal Grandmother   . Prostate cancer Paternal Grandfather   . Colon polyps Father   . Heart disease Father   . Colon cancer Neg Hx      Review of Systems   Constitutional: negative for anorexia, fevers and sweats  Eyes: negative for irritation, redness and visual disturbance  Ears, nose, mouth, throat, and face: negative for earaches, epistaxis, nasal congestion and sore throat  Respiratory: negative for cough, dyspnea on exertion, sputum and wheezing    Cardiovascular: negative for chest pain, dyspnea, lower extremity edema, orthopnea, palpitations and syncope  Gastrointestinal: negative for abdominal pain, constipation, diarrhea, melena, nausea and vomiting  Genitourinary:negative for dysuria, frequency and hematuria  Hematologic/lymphatic: negative for bleeding, easy bruising and lymphadenopathy  Musculoskeletal:negative for arthralgias, muscle weakness and stiff joints  Neurological: negative for coordination problems, gait problems, headaches and weakness  Endocrine: negative for diabetic symptoms including polydipsia, polyuria and weight loss     Objective:   Physical Exam  Gen. Pleasant, obese, in no distress, normal affect ENT - enlarged tonsils, no post nasal drip, class 2-3 airway Neck: No JVD, no thyromegaly, no carotid bruits Lungs: no use of accessory muscles, no dullness to percussion, decreased without rales or rhonchi  Cardiovascular: Rhythm regular, heart sounds  normal, no murmurs or gallops, no peripheral edema Abdomen: soft and non-tender, no hepatosplenomegaly, BS normal. Musculoskeletal: No deformities, no cyanosis or clubbing Neuro:  alert, non focal, no tremors       Assessment & Plan:

## 2017-01-01 NOTE — Assessment & Plan Note (Addendum)
She had very mild OSA but seems like symptoms are quite out of proportion and therefore she was started on CPAP. She has gained about 34 pounds in the last few years and is likely that she will need a pressure increase  will need pressure adjusted on your CPAP machine.  place on auto CPAP settings 5-10 cm and obtain download in 2 weeks   CPAP supplies will be renewed for a year. You have enlarged tonsils - may need ENT evaluation   Weight loss encouraged, compliance with goal of at least 4-6 hrs every night is the expectation. Advised against medications with sedative side effects Cautioned against driving when sleepy - understanding that sleepiness will vary on a day to day basis

## 2017-01-07 DIAGNOSIS — G4733 Obstructive sleep apnea (adult) (pediatric): Secondary | ICD-10-CM | POA: Diagnosis not present

## 2017-01-08 DIAGNOSIS — G4733 Obstructive sleep apnea (adult) (pediatric): Secondary | ICD-10-CM | POA: Diagnosis not present

## 2017-02-02 DIAGNOSIS — M542 Cervicalgia: Secondary | ICD-10-CM | POA: Diagnosis not present

## 2017-02-12 ENCOUNTER — Ambulatory Visit (AMBULATORY_SURGERY_CENTER): Payer: Self-pay | Admitting: *Deleted

## 2017-02-12 VITALS — Ht 64.0 in | Wt 222.8 lb

## 2017-02-12 DIAGNOSIS — R195 Other fecal abnormalities: Secondary | ICD-10-CM

## 2017-02-12 DIAGNOSIS — R198 Other specified symptoms and signs involving the digestive system and abdomen: Secondary | ICD-10-CM

## 2017-02-12 DIAGNOSIS — R0989 Other specified symptoms and signs involving the circulatory and respiratory systems: Secondary | ICD-10-CM

## 2017-02-12 DIAGNOSIS — F458 Other somatoform disorders: Secondary | ICD-10-CM

## 2017-02-12 MED ORDER — NA SULFATE-K SULFATE-MG SULF 17.5-3.13-1.6 GM/177ML PO SOLN: 1.0000 [IU] | Freq: Once | ORAL | 0 refills | Status: AC

## 2017-02-12 NOTE — Progress Notes (Signed)
No egg allergy known to patient  Soy makes her itch No issues with past sedation with any surgeries  or procedures, no intubation problems  No diet pills per patient No home 02 use per patient  No blood thinners per patient  Pt denies issues with constipation  No A fib or A flutter  EMMI video sent to pt's e mail for EGD and Colon

## 2017-02-16 DIAGNOSIS — R51 Headache: Secondary | ICD-10-CM | POA: Diagnosis not present

## 2017-02-16 DIAGNOSIS — G619 Inflammatory polyneuropathy, unspecified: Secondary | ICD-10-CM | POA: Diagnosis not present

## 2017-02-16 DIAGNOSIS — R0602 Shortness of breath: Secondary | ICD-10-CM | POA: Diagnosis not present

## 2017-02-16 DIAGNOSIS — R5383 Other fatigue: Secondary | ICD-10-CM | POA: Diagnosis not present

## 2017-02-23 ENCOUNTER — Encounter: Payer: BLUE CROSS/BLUE SHIELD | Admitting: Gastroenterology

## 2017-02-25 ENCOUNTER — Ambulatory Visit (AMBULATORY_SURGERY_CENTER): Payer: BLUE CROSS/BLUE SHIELD | Admitting: Gastroenterology

## 2017-02-25 ENCOUNTER — Encounter: Payer: Self-pay | Admitting: Gastroenterology

## 2017-02-25 VITALS — BP 122/76 | HR 84 | Temp 97.0°F | Resp 20 | Ht 64.0 in | Wt 222.0 lb

## 2017-02-25 DIAGNOSIS — F458 Other somatoform disorders: Secondary | ICD-10-CM

## 2017-02-25 DIAGNOSIS — R1310 Dysphagia, unspecified: Secondary | ICD-10-CM | POA: Diagnosis not present

## 2017-02-25 DIAGNOSIS — R933 Abnormal findings on diagnostic imaging of other parts of digestive tract: Secondary | ICD-10-CM | POA: Diagnosis not present

## 2017-02-25 DIAGNOSIS — K224 Dyskinesia of esophagus: Secondary | ICD-10-CM | POA: Diagnosis not present

## 2017-02-25 DIAGNOSIS — R0989 Other specified symptoms and signs involving the circulatory and respiratory systems: Secondary | ICD-10-CM

## 2017-02-25 DIAGNOSIS — R198 Other specified symptoms and signs involving the digestive system and abdomen: Secondary | ICD-10-CM

## 2017-02-25 MED ORDER — SODIUM CHLORIDE 0.9 % IV SOLN: 500.0000 mL | INTRAVENOUS | Status: DC

## 2017-02-25 NOTE — Op Note (Signed)
[facility] Patient Name: Lorraine Conrad Procedure Date: 02/25/2017 2:59 PM MRN: [id number] Endoscopist: Ladene Artist , MD Age: 46 Referring MD:  Date of Birth: 09/01/1970 Gender: Female Account #: [id number] Procedure:                Upper GI endoscopy Indications:              Dysphagia, Abnormal UGI series, Globus sensation Medicines:                Monitored Anesthesia Care Procedure:                Pre-Anesthesia Assessment:                           - Prior to the procedure, a History and Physical                            was performed, and patient medications and                            allergies were reviewed. The patient's tolerance of                            previous anesthesia was also reviewed. The risks                            and benefits of the procedure and the sedation                            options and risks were discussed with the patient.                            All questions were answered, and informed consent                            was obtained. Prior Anticoagulants: The patient has                            taken no previous anticoagulant or antiplatelet                            agents. ASA Grade Assessment: II - A patient with                            mild systemic disease. After reviewing the risks                            and benefits, the patient was deemed in                            satisfactory condition to undergo the procedure.                           After obtaining informed consent, the endoscope was  passed under direct vision. Throughout the                            procedure, the patient's blood pressure, pulse, and                            oxygen saturations were monitored continuously. The                            Endoscope was introduced through the mouth, and                            advanced to the second part of duodenum. The upper                            GI  endoscopy was accomplished without difficulty.                            The patient tolerated the procedure well. Scope In: Scope Out: Findings:                 No endoscopic abnormality was evident in the                            esophagus to explain the patient's complaint of                            dysphagia. The esophagus appeared. It was decided,                            however, to proceed with dilation of the entire                            esophagus. A guidewire was placed and the scope was                            withdrawn. Dilation was performed with a Savary                            dilator with no resistance at 16 mm.                           The duodenal bulb and second portion of the                            duodenum were normal.                           A small hiatal hernia was present.                           The exam of the stomach was otherwise normal. Complications:            No immediate complications. Estimated Blood Loss:     Estimated blood loss:  none. Impression:               - No endoscopic esophageal abnormality to explain                            patient's dysphagia. Esophagus appeared normal.                            Esophagus dilated.                           - Small hiatal hernia, otherwise normal stomach.                           - Normal duodenal bulb and second portion of the                            duodenum.                           - No specimens collected. Recommendation:           - Patient has a contact number available for                            emergencies. The signs and symptoms of potential                            delayed complications were discussed with the                            patient. Return to normal activities tomorrow.                            Written discharge instructions were provided to the                            patient.                           - Clear liquid diet for 2 hours,  then advance as                            tolerated to soft diet today. Resume prior diet                            tomorrow.                           - Continue present medications.                           - Perform routine esophageal manometry at                            appointment to be scheduled. Ladene Artist, MD 02/25/2017 3:13:42 PM This report has been signed electronically.

## 2017-02-25 NOTE — Progress Notes (Signed)
Called to room to assist during endoscopic procedure.  Patient ID and intended procedure confirmed with present staff. Received instructions for my participation in the procedure from the performing physician.  

## 2017-02-25 NOTE — Patient Instructions (Signed)
Impression/recommendations:  Stricture (handout given) Hiatal hernia (handout given)  YOU HAD AN ENDOSCOPIC PROCEDURE TODAY AT [facility]:   Refer to the procedure report that was given to you for any specific questions about what was found during the examination.  If the procedure report does not answer your questions, please call your gastroenterologist to clarify.  If you requested that your care partner not be given the details of your procedure findings, then the procedure report has been included in a sealed envelope for you to review at your convenience later.  YOU SHOULD EXPECT: Some feelings of bloating in the abdomen. Passage of more gas than usual.  Walking can help get rid of the air that was put into your GI tract during the procedure and reduce the bloating. If you had a lower endoscopy (such as a colonoscopy or flexible sigmoidoscopy) you may notice spotting of blood in your stool or on the toilet paper. If you underwent a bowel prep for your procedure, you may not have a normal bowel movement for a few days.  Please Note:  You might notice some irritation and congestion in your nose or some drainage.  This is from the oxygen used during your procedure.  There is no need for concern and it should clear up in a day or so.  SYMPTOMS TO REPORT IMMEDIATELY:   Following upper endoscopy (EGD)  Vomiting of blood or coffee ground material  New chest pain or pain under the shoulder blades  Painful or persistently difficult swallowing  New shortness of breath  Fever of 100F or higher  Black, tarry-looking stools  For urgent or emergent issues, a gastroenterologist can be reached at any hour by calling [phone].   DIET:  We do recommend a small meal at first, but then you may proceed to your regular diet.  Drink plenty of fluids but you should avoid alcoholic beverages for 24 hours.  ACTIVITY:  You should plan to take it easy for the rest of today and you  should NOT DRIVE or use heavy machinery until tomorrow (because of the sedation medicines used during the test).    FOLLOW UP: Our staff will call the number listed on your records the next business day following your procedure to check on you and address any questions or concerns that you may have regarding the information given to you following your procedure. If we do not reach you, we will leave a message.  However, if you are feeling well and you are not experiencing any problems, there is no need to return our call.  We will assume that you have returned to your regular daily activities without incident.  If any biopsies were taken you will be contacted by phone or by letter within the next 1-3 weeks.  Please call us at [phone] if you have not heard about the biopsies in 3 weeks.    SIGNATURES/CONFIDENTIALITY: You and/or your care partner have signed paperwork which will be entered into your electronic medical record.  These signatures attest to the fact that that the information above on your After Visit Summary has been reviewed and is understood.  Full responsibility of the confidentiality of this discharge information lies with you and/or your care-partner.

## 2017-02-25 NOTE — Progress Notes (Signed)
Pt's states no medical or surgical changes since previsit or office visit.  Maw  Pt added that soy by mouth causes itching.  Added to allergies and to pt's allergy label. maw

## 2017-02-26 ENCOUNTER — Telehealth: Payer: Self-pay

## 2017-02-26 NOTE — Telephone Encounter (Signed)
  Follow up Call-  Call back number 02/25/2017  Post procedure Call Back phone  # [phone] Dicky Doe - husband  Permission to leave phone message Yes  Some recent data might be hidden     Patient questions:  Do you have a fever, pain , or abdominal swelling? No. Pain Score  0 *  Have you tolerated food without any problems? Yes.    Have you been able to return to your normal activities? Yes.    Do you have any questions about your discharge instructions: Diet   No. Medications  No. Follow up visit  No.  Do you have questions or concerns about your Care? No.  Actions: * If pain score is 4 or above: No action needed, pain <4.

## 2017-02-26 NOTE — Telephone Encounter (Signed)
Patient notified of the recommendations for esophageal manometry. She is scheduled for 10:15 on 03/11/17.  She reports that she has some swelling along her jaw line and is sore to the touch this am.  I discussed with Dr. Fuller Plan, patient had to have a jaw lift performed on her after the procedure due to some respiratory difficulty.  She is advised that she can take Tylenol for pain and use warm compresses.  Swelling and pain should resolve quickly.  She will call back for any additional questions or concerns.  Instructions for manometry mailed to her she will call back for any questions or concerns.

## 2017-02-27 ENCOUNTER — Telehealth: Payer: Self-pay | Admitting: Gastroenterology

## 2017-02-27 DIAGNOSIS — N952 Postmenopausal atrophic vaginitis: Secondary | ICD-10-CM | POA: Diagnosis not present

## 2017-02-27 DIAGNOSIS — Z01419 Encounter for gynecological examination (general) (routine) without abnormal findings: Secondary | ICD-10-CM | POA: Diagnosis not present

## 2017-02-27 DIAGNOSIS — Z8742 Personal history of other diseases of the female genital tract: Secondary | ICD-10-CM | POA: Diagnosis not present

## 2017-02-27 NOTE — Telephone Encounter (Signed)
I helped the patient reschedule manometry to 03/16/17 at 10:30

## 2017-03-04 DIAGNOSIS — G8929 Other chronic pain: Secondary | ICD-10-CM | POA: Diagnosis not present

## 2017-03-04 DIAGNOSIS — M791 Myalgia: Secondary | ICD-10-CM | POA: Diagnosis not present

## 2017-03-11 ENCOUNTER — Encounter: Payer: Self-pay | Admitting: Gastroenterology

## 2017-03-16 ENCOUNTER — Ambulatory Visit (HOSPITAL_COMMUNITY)
Admission: RE | Admit: 2017-03-16 | Discharge: 2017-03-16 | Disposition: A | Discharge status: Home / Self Care | Payer: BLUE CROSS/BLUE SHIELD | Source: Ambulatory Visit | Attending: Gastroenterology | Admitting: Gastroenterology

## 2017-03-16 ENCOUNTER — Encounter (HOSPITAL_COMMUNITY)
Admission: RE | Disposition: A | Discharge status: Home / Self Care | Payer: Self-pay | Source: Ambulatory Visit | Attending: Gastroenterology

## 2017-03-16 DIAGNOSIS — R1310 Dysphagia, unspecified: Secondary | ICD-10-CM | POA: Diagnosis not present

## 2017-03-16 DIAGNOSIS — Z5309 Procedure and treatment not carried out because of other contraindication: Secondary | ICD-10-CM | POA: Diagnosis not present

## 2017-03-16 SURGERY — CANCELLED PROCEDURE

## 2017-03-16 MED ORDER — LIDOCAINE VISCOUS 2 % MT SOLN
OROMUCOSAL | Status: AC
  Filled 2017-03-16: qty 15

## 2017-03-16 SURGICAL SUPPLY — 2 items
FACESHIELD LNG OPTICON STERILE (SAFETY) IMPLANT
GLOVE BIO SURGEON STRL SZ8 (GLOVE) ×6 IMPLANT

## 2017-03-16 NOTE — Progress Notes (Signed)
Pt here to have esophageal manometry and had just eaten breakfast about an hour and half ago.  Pt had an eggs and biscuit, etc.... Informed patient that she needed to be NPO for at least six hours from food. Pt rescheduled this coming Friday at 1230pm 03/20/2017. Gave pts instructions and told her best to be NPO after midnight but could have meds and small snack 6 hours before but should be NPO for six hours before test. PT voiced understanding.

## 2017-03-20 ENCOUNTER — Ambulatory Visit (HOSPITAL_COMMUNITY)
Admission: RE | Admit: 2017-03-20 | Discharge: 2017-03-20 | Disposition: A | Discharge status: Home / Self Care | Payer: BLUE CROSS/BLUE SHIELD | Source: Ambulatory Visit | Attending: Gastroenterology | Admitting: Gastroenterology

## 2017-03-20 ENCOUNTER — Encounter (HOSPITAL_COMMUNITY): Payer: Self-pay | Admitting: *Deleted

## 2017-03-20 ENCOUNTER — Encounter (HOSPITAL_COMMUNITY)
Admission: RE | Disposition: A | Discharge status: Home / Self Care | Payer: Self-pay | Source: Ambulatory Visit | Attending: Gastroenterology

## 2017-03-20 DIAGNOSIS — R0989 Other specified symptoms and signs involving the circulatory and respiratory systems: Secondary | ICD-10-CM | POA: Diagnosis not present

## 2017-03-20 DIAGNOSIS — Z538 Procedure and treatment not carried out for other reasons: Secondary | ICD-10-CM | POA: Insufficient documentation

## 2017-03-20 DIAGNOSIS — R1310 Dysphagia, unspecified: Secondary | ICD-10-CM | POA: Insufficient documentation

## 2017-03-20 DIAGNOSIS — R1110 Vomiting, unspecified: Secondary | ICD-10-CM | POA: Insufficient documentation

## 2017-03-20 SURGERY — INVASIVE LAB ABORTED CASE

## 2017-03-20 MED ORDER — LIDOCAINE VISCOUS 2 % MT SOLN
OROMUCOSAL | Status: AC
  Filled 2017-03-20: qty 15

## 2017-03-20 SURGICAL SUPPLY — 2 items
FACESHIELD LNG OPTICON STERILE (SAFETY) IMPLANT
GLOVE BIO SURGEON STRL SZ8 (GLOVE) ×6 IMPLANT

## 2017-03-20 NOTE — Progress Notes (Signed)
Procedure aborted after 3 attempts by RN and 1 attempt by MD.  Patient unable to tolerated placement of manometry probe.  Patient unable to stop choking and vomiting.  Will notify Dr Fuller Plan today.

## 2017-03-25 ENCOUNTER — Encounter: Payer: Self-pay | Admitting: Gastroenterology

## 2017-03-25 ENCOUNTER — Ambulatory Visit (AMBULATORY_SURGERY_CENTER): Payer: BLUE CROSS/BLUE SHIELD | Admitting: Gastroenterology

## 2017-03-25 VITALS — BP 99/74 | HR 78 | Temp 98.4°F | Resp 12 | Ht 64.0 in | Wt 222.0 lb

## 2017-03-25 DIAGNOSIS — R195 Other fecal abnormalities: Secondary | ICD-10-CM | POA: Diagnosis not present

## 2017-03-25 DIAGNOSIS — K921 Melena: Secondary | ICD-10-CM

## 2017-03-25 MED ORDER — SODIUM CHLORIDE 0.9 % IV SOLN: 500.0000 mL | INTRAVENOUS | Status: DC

## 2017-03-25 NOTE — Patient Instructions (Signed)
Hemorrhoids seen today. Handout given on hemorrhoids. Repeat colonoscopy in 10 years. Resume current medications. Call us with ay questions or concerns. Thank you!!   YOU HAD AN ENDOSCOPIC PROCEDURE TODAY AT [facility]:   Refer to the procedure report that was given to you for any specific questions about what was found during the examination.  If the procedure report does not answer your questions, please call your gastroenterologist to clarify.  If you requested that your care partner not be given the details of your procedure findings, then the procedure report has been included in a sealed envelope for you to review at your convenience later.  YOU SHOULD EXPECT: Some feelings of bloating in the abdomen. Passage of more gas than usual.  Walking can help get rid of the air that was put into your GI tract during the procedure and reduce the bloating. If you had a lower endoscopy (such as a colonoscopy or flexible sigmoidoscopy) you may notice spotting of blood in your stool or on the toilet paper. If you underwent a bowel prep for your procedure, you may not have a normal bowel movement for a few days.  Please Note:  You might notice some irritation and congestion in your nose or some drainage.  This is from the oxygen used during your procedure.  There is no need for concern and it should clear up in a day or so.  SYMPTOMS TO REPORT IMMEDIATELY:   Following lower endoscopy (colonoscopy or flexible sigmoidoscopy):  Excessive amounts of blood in the stool  Significant tenderness or worsening of abdominal pains  Swelling of the abdomen that is new, acute  Fever of 100F or higher  For urgent or emergent issues, a gastroenterologist can be reached at any hour by calling [phone].   DIET:  We do recommend a small meal at first, but then you may proceed to your regular diet.  Drink plenty of fluids but you should avoid alcoholic beverages for 24 hours.  ACTIVITY:  You  should plan to take it easy for the rest of today and you should NOT DRIVE or use heavy machinery until tomorrow (because of the sedation medicines used during the test).    FOLLOW UP: Our staff will call the number listed on your records the next business day following your procedure to check on you and address any questions or concerns that you may have regarding the information given to you following your procedure. If we do not reach you, we will leave a message.  However, if you are feeling well and you are not experiencing any problems, there is no need to return our call.  We will assume that you have returned to your regular daily activities without incident.  If any biopsies were taken you will be contacted by phone or by letter within the next 1-3 weeks.  Please call us at [phone] if you have not heard about the biopsies in 3 weeks.    SIGNATURES/CONFIDENTIALITY: You and/or your care partner have signed paperwork which will be entered into your electronic medical record.  These signatures attest to the fact that that the information above on your After Visit Summary has been reviewed and is understood.  Full responsibility of the confidentiality of this discharge information lies with you and/or your care-partner.

## 2017-03-25 NOTE — Progress Notes (Signed)
Report to PACU, RN, vss, BBS= Clear.  

## 2017-03-25 NOTE — Op Note (Signed)
[facility] Patient Name: Lorraine Conrad Procedure Date: 03/25/2017 2:33 PM MRN: [id number] Endoscopist: Ladene Artist , MD Age: 46 Referring MD:  Date of Birth: 1970/12/25 Gender: Female Account #: [id number] Procedure:                Colonoscopy Indications:              Hematochezia Medicines:                Monitored Anesthesia Care Procedure:                Pre-Anesthesia Assessment:                           - Prior to the procedure, a History and Physical                            was performed, and patient medications and                            allergies were reviewed. The patient's tolerance of                            previous anesthesia was also reviewed. The risks                            and benefits of the procedure and the sedation                            options and risks were discussed with the patient.                            All questions were answered, and informed consent                            was obtained. Prior Anticoagulants: The patient has                            taken no previous anticoagulant or antiplatelet                            agents. ASA Grade Assessment: II - A patient with                            mild systemic disease. After reviewing the risks                            and benefits, the patient was deemed in                            satisfactory condition to undergo the procedure.                           After obtaining informed consent, the colonoscope  was passed under direct vision. Throughout the                            procedure, the patient's blood pressure, pulse, and                            oxygen saturations were monitored continuously. The                            Model PCF-[id number]) scope was introduced                            through the anus and advanced to the the cecum,                            identified by appendiceal orifice and ileocecal                             valve. The ileocecal valve, appendiceal orifice,                            and rectum were photographed. The quality of the                            bowel preparation was excellent. The colonoscopy                            was performed without difficulty. The patient                            tolerated the procedure well. Scope In: 2:40:53 PM Scope Out: 2:51:22 PM Scope Withdrawal Time: 0 hours 8 minutes 15 seconds  Total Procedure Duration: 0 hours 10 minutes 29 seconds  Findings:                 The perianal exam findings include a skin tag,                            otherwise unremarkable.                           Internal hemorrhoids were found during                            retroflexion. The hemorrhoids were small and Grade                            I (internal hemorrhoids that do not prolapse).                           The exam was otherwise without abnormality on                            direct and retroflexion views. Complications:            No  immediate complications. Estimated blood loss:                            None. Estimated Blood Loss:     Estimated blood loss: none. Impression:               - Perianal skin tag found on perianal exam.                           - Internal hemorrhoids.                           - The examination was otherwise normal on direct                            and retroflexion views.                           - No specimens collected. Recommendation:           - Repeat colonoscopy in 10 years for screening                            purposes.                           - Patient has a contact number available for                            emergencies. The signs and symptoms of potential                            delayed complications were discussed with the                            patient. Return to normal activities tomorrow.                            Written discharge instructions were provided  to the                            patient.                           - Resume previous diet.                           - Continue present medications. Ladene Artist, MD 03/25/2017 2:54:51 PM This report has been signed electronically.

## 2017-03-26 ENCOUNTER — Telehealth: Payer: Self-pay | Admitting: *Deleted

## 2017-03-26 NOTE — Telephone Encounter (Signed)
  Follow up Call-  Call back number 03/25/2017 02/25/2017  Post procedure Call Back phone  # [phone] Dicky Doe - husband  Permission to leave phone message No Yes  Some recent data might be hidden     Patient questions:  Do you have a fever, pain , or abdominal swelling? No. Pain Score  0 *  Have you tolerated food without any problems? Yes.    Have you been able to return to your normal activities? Yes.    Do you have any questions about your discharge instructions: Diet   No. Medications  No. Follow up visit  No.  Do you have questions or concerns about your Care? No.  Actions: * If pain score is 4 or above: No action needed, pain <4.

## 2017-04-02 ENCOUNTER — Telehealth: Payer: Self-pay | Admitting: Gastroenterology

## 2017-04-03 NOTE — Telephone Encounter (Signed)
Patient with continued dysphagia.  They were not able complete mano due to not being able to pass the probe. She will come in and see Dr. Fuller Plan on 04/20/17

## 2017-04-13 DIAGNOSIS — M5489 Other dorsalgia: Secondary | ICD-10-CM | POA: Diagnosis not present

## 2017-04-13 DIAGNOSIS — M5442 Lumbago with sciatica, left side: Secondary | ICD-10-CM | POA: Diagnosis not present

## 2017-04-13 DIAGNOSIS — M542 Cervicalgia: Secondary | ICD-10-CM | POA: Diagnosis not present

## 2017-04-13 DIAGNOSIS — G8929 Other chronic pain: Secondary | ICD-10-CM | POA: Diagnosis not present

## 2017-04-13 DIAGNOSIS — R202 Paresthesia of skin: Secondary | ICD-10-CM | POA: Diagnosis not present

## 2017-04-13 DIAGNOSIS — R51 Headache: Secondary | ICD-10-CM | POA: Diagnosis not present

## 2017-04-14 DIAGNOSIS — R202 Paresthesia of skin: Secondary | ICD-10-CM | POA: Insufficient documentation

## 2017-04-20 ENCOUNTER — Ambulatory Visit: Payer: BLUE CROSS/BLUE SHIELD | Admitting: Gastroenterology

## 2017-05-12 DIAGNOSIS — R3915 Urgency of urination: Secondary | ICD-10-CM | POA: Diagnosis not present

## 2017-05-12 DIAGNOSIS — R350 Frequency of micturition: Secondary | ICD-10-CM | POA: Diagnosis not present

## 2017-05-12 DIAGNOSIS — R399 Unspecified symptoms and signs involving the genitourinary system: Secondary | ICD-10-CM | POA: Diagnosis not present

## 2017-05-12 DIAGNOSIS — R300 Dysuria: Secondary | ICD-10-CM | POA: Diagnosis not present

## 2017-06-12 ENCOUNTER — Ambulatory Visit (INDEPENDENT_AMBULATORY_CARE_PROVIDER_SITE_OTHER): Payer: BLUE CROSS/BLUE SHIELD | Admitting: Gastroenterology

## 2017-06-12 ENCOUNTER — Encounter: Payer: Self-pay | Admitting: Gastroenterology

## 2017-06-12 VITALS — BP 96/80 | HR 96 | Ht 64.0 in | Wt 223.1 lb

## 2017-06-12 DIAGNOSIS — R1310 Dysphagia, unspecified: Secondary | ICD-10-CM

## 2017-06-12 DIAGNOSIS — R221 Localized swelling, mass and lump, neck: Secondary | ICD-10-CM | POA: Diagnosis not present

## 2017-06-12 DIAGNOSIS — F458 Other somatoform disorders: Secondary | ICD-10-CM

## 2017-06-12 DIAGNOSIS — R198 Other specified symptoms and signs involving the digestive system and abdomen: Secondary | ICD-10-CM

## 2017-06-12 DIAGNOSIS — R0989 Other specified symptoms and signs involving the circulatory and respiratory systems: Secondary | ICD-10-CM

## 2017-06-12 NOTE — Patient Instructions (Addendum)
[facility] ENT will contact you with an appt date and time.   Stay on Prevacid 30 mg daily x 3 months and Zantac every evening x 3 months.   Patient advised to avoid spicy, acidic, citrus, chocolate, mints, fruit and fruit juices.  Limit the intake of caffeine, alcohol and Soda.  Don't exercise too soon after eating.  Don't lie down within 3-4 hours of eating.  Elevate the head of your bed.   Normal BMI (Body Mass Index- based on height and weight) is between 19 and 25. Your BMI today is Body mass index is 38.3 kg/m. Marland Kitchen Please consider follow up  regarding your BMI with your Primary Care Provider.  Thank you for choosing me and [facility].  Pricilla Riffle. Dagoberto Ligas., MD., Marval Regal

## 2017-06-12 NOTE — Progress Notes (Signed)
    History of Present Illness: This is a 47 year old female returning for follow-up of dysphasia and globus sensation.  Unfortunately she was not able to complete esophageal manometry scheduled in October due to persistent gagging.  She relates intermittent difficulties with neck swelling throat discomfort and difficulty swallowing.  She localizes the symptoms to her neck.  She states she took a Benadryl and her neck swelling seemed to improve as well as her swallowing.  She takes reflux medications intermittently when her heartburn symptoms are noted.  EGD in was normal in 02/2017 and an empiric esophageal dilation was performed without a change in symptoms.  UGI series 12/2016 IMPRESSION: 1. Mild nonspecific esophageal dysmotility disorder, with partial disruption of 3 out of 4 primary peristaltic waves in the esophagus. 2. The patient experienced a mild globus sensation just prior to swallowing the 13 mm barium tablet. 3. Small type 1 hiatal hernia. 4. Distal esophageal fold thickening, potentially a reflection of mild esophagitis. No ulcer or stricture.  Current Medications, Allergies, Past Medical History, Past Surgical History, Family History and Social History were reviewed in [employer] record.  Physical Exam: General: Well developed, well nourished, no acute distress Head: Normocephalic and atraumatic Eyes:  sclerae anicteric, EOMI Ears: Normal auditory acuity Mouth: No deformity or lesions Lungs: Clear throughout to auscultation Heart: Regular rate and rhythm; no murmurs, rubs or bruits Abdomen: Soft, non tender and non distended. No masses, hepatosplenomegaly or hernias noted. Normal Bowel sounds Rectal: not done Musculoskeletal: Symmetrical with no gross deformities  Pulses:  Normal pulses noted Extremities: No clubbing, cyanosis, edema or deformities noted Neurological: Alert oriented x 4, grossly nonfocal Psychological:  Alert and cooperative. Anxiety.    Assessment and Recommendations:  1.  Globus sensation, neck and throat symptoms, unspecified dysphasia.  She may have a mild underlying motility disorder based on the barium study however she could not tolerate esophageal manometry.  Most mild esophageal motility disorders have no specific therapy other than diet modification so I have asked her to modify her diet to foods that she can more easily tolerate swallowing. ENT referral for further evaluation globus sensation, neck and throat symptoms.  GERD.  2.  GERD with mild symptoms and no esophageal finding at EGD.  I have advised her to remain on Prevacid 30 mg every morning and ranitidine 150 mg at bedtime for 3 solid months to determine if this will improve any of her symptoms.

## 2017-08-14 ENCOUNTER — Ambulatory Visit: Payer: BLUE CROSS/BLUE SHIELD | Admitting: Internal Medicine

## 2017-08-14 DIAGNOSIS — Z0289 Encounter for other administrative examinations: Secondary | ICD-10-CM

## 2017-08-25 ENCOUNTER — Ambulatory Visit: Payer: BLUE CROSS/BLUE SHIELD | Admitting: Podiatry

## 2017-09-08 ENCOUNTER — Ambulatory Visit: Payer: BLUE CROSS/BLUE SHIELD | Admitting: Sports Medicine

## 2017-09-15 ENCOUNTER — Ambulatory Visit: Payer: BLUE CROSS/BLUE SHIELD | Admitting: Sports Medicine

## 2017-10-05 ENCOUNTER — Ambulatory Visit: Payer: Self-pay | Admitting: *Deleted

## 2017-10-05 NOTE — Telephone Encounter (Signed)
Pt reports headaches for quite a while.At this time the  Pain scale is  3 . Her BP was elevated over weekend. Appointment made with Mackie Pai for tomorrow   Reason for Disposition . [1] MILD-MODERATE headache AND [2] present > 72 hours  Answer Assessment - Initial Assessment Questions 1. LOCATION: "Where does it hurt?"       Top of head    2. ONSET: "When did the headache start?" (Minutes, hours or days)        Months  Ago  Has  Had  For a  While   3. PATTERN: "Does the pain come and go, or has it been constant since it started?"       Off  And  On    -  Typically  It is   In early  Am    4. SEVERITY: "How bad is the pain?" and "What does it keep you from doing?"  (e.g., Scale 1-10; mild, moderate, or severe)   - MILD (1-3): doesn't interfere with normal activities    - MODERATE (4-7): interferes with normal activities or awakens from sleep    - SEVERE (8-10): excruciating pain, unable to do any normal activities        3    5. RECURRENT SYMPTOM: "Have you ever had headaches before?" If so, ask: "When was the last time?" and "What happened that time?"      Yes   The  Headaches  Have  Been  Going on  For  Months    6. CAUSE: "What do you think is causing the headache?"        Unknown   7. MIGRAINE: "Have you been diagnosed with migraine headaches?" If so, ask: "Is this headache similar?"         Had  Migraines   20  Year   Years  Ago    8. HEAD INJURY: "Has there been any recent injury to the head?"         No  9. OTHER SYMPTOMS: "Do you have any other symptoms?" (fever, stiff neck, eye pain, sore throat, cold symptoms)         Bp  Was  Elevated  This  Weekend  140/100  2  Days  Ago  And  Wilburn Mylar  It  Was  130/88    10. PREGNANCY: "Is there any chance you are pregnant?" "When was your last menstrual period?"        Hysterectomy  Protocols used: HEADACHE-A-AH

## 2017-10-06 ENCOUNTER — Ambulatory Visit (INDEPENDENT_AMBULATORY_CARE_PROVIDER_SITE_OTHER): Payer: BLUE CROSS/BLUE SHIELD | Admitting: Medical

## 2017-10-06 ENCOUNTER — Encounter: Payer: Self-pay | Admitting: Medical

## 2017-10-06 VITALS — BP 150/90 | HR 85 | Temp 98.4°F | Resp 16 | Ht 64.0 in | Wt 223.8 lb

## 2017-10-06 DIAGNOSIS — J3489 Other specified disorders of nose and nasal sinuses: Secondary | ICD-10-CM | POA: Diagnosis not present

## 2017-10-06 DIAGNOSIS — R030 Elevated blood-pressure reading, without diagnosis of hypertension: Secondary | ICD-10-CM

## 2017-10-06 MED ORDER — FLUTICASONE PROPIONATE 50 MCG/ACT NA SUSP: 2.0000 | Freq: Every day | NASAL | 1 refills | Status: DC

## 2017-10-06 NOTE — Patient Instructions (Addendum)
You do have elevated blood pressure today and also blood pressure recently when you check with your dad I want machine.  I want you to start checking your blood pressure daily with over-the-counter wrist blood pressure cuff.  Write down those readings and follow-up in 2 weeks for blood pressure review.  If the majority of your blood pressure readings are over 140/90 then would prescribe blood pressure medication.  During the interim avoid any decongestants and reduce caffeine.  If your blood pressure levels are not elevated then would consider your reported head pressure sensation as possible headache.  In that event might consider CT imaging.  But on review of prior imaging 2016 MRI of brain was negative and CT of the head in 2017 was also negative.  Some sinus pressure sensation recently.  He thinks this might be related to allergies then you could use Flonase.  Prescription sent today.  For globus sensation with various activities including use of CPAP, will see what ENT thinks on this Monday's appointment.  That after reviewing that note I might refer you back to your former pulmonologist and get his opinion on possible changing equipment.(If by chance that could play a role in globus sensation?)  Follow-up in 2 weeks or as needed.

## 2017-10-06 NOTE — Progress Notes (Signed)
Subjective:    Patient ID: Lorraine Conrad, female    DOB: [birth year], 47 y.o.   MRN: [id number]  HPI   Pt in with some report of having some head pressure/ha  and some facial pressure at time. Thinks face flushes at times.  Pt started to check her blood pressure thinking that he blood pressure may be elevated. One time her bp was 140/100 on Saturday when head pressure was most prominent.    The above she states has been going on for a while. Thinks maybe 6 months.  On review no report of ha just pressure. No gross motor or sensory or function deficits  Pt states she is not using her cpap machine. Pt has Dr. Lynford Citizen pulmonologist. It has been 6-10 years ago since her last testing. [facility] is seeing pt and down loaded information and told staff indicated to her apnea not that bad.Pt has history of globus sensation in past. Recently using the cpap felt globus sensation so she stopped. Using cpap.  Pt is going to see ENT for Globus sensation on onday. She has seen GI and they though mild motility disorder.  Pt states Dr. Lynford Citizen thought she had diastolic dysfunction.   Review of Systems  Constitutional: Negative for chills, fatigue and fever.  HENT: Positive for congestion. Negative for ear pain and mouth sores.   Respiratory: Negative for cough, chest tightness and shortness of breath.   Cardiovascular: Negative for chest pain and palpitations.  Gastrointestinal: Negative for abdominal pain, diarrhea and nausea.  Musculoskeletal: Negative for back pain.  Skin: Negative for rash.  Neurological: Negative for dizziness, syncope, speech difficulty, weakness, light-headedness, numbness and headaches.       Mild head pressure sensation at times.  Psychiatric/Behavioral: Negative for confusion.   Past Medical History:  Diagnosis Date  . Allergy    SEASONAL  . Anemia   . Arthritis    "spine" (03/01/2014)  . Asthma   . Chronic lower back pain   . Endometriosis   . Erosive  gastritis   . External hemorrhoids   . GERD (gastroesophageal reflux disease)   . H/O shortness of breath 2014   Cardiopulmonary exercise test results  . Headache    "probably monthly" (03/01/2014)  . Migraine 1998; 02/2014   "this one's lasted 10 days straight" (03/01/2014)  . OSA on CPAP   . Ovarian cyst, left   . Proctalgia fugax   . Sleep apnea    on cpap  . Thyroid disease    thyroid nodule     Social History   Socioeconomic History  . Marital status: Married    Spouse name: Not on file  . Number of children: 2  . Years of education: Not on file  . Highest education level: Not on file  Occupational History  . Occupation: homemaker  Social Needs  . Financial resource strain: Not on file  . Food insecurity:    Worry: Not on file    Inability: Not on file  . Transportation needs:    Medical: Not on file    Non-medical: Not on file  Tobacco Use  . Smoking status: Never Smoker  . Smokeless tobacco: Never Used  . Tobacco comment: father smoked in house growing up per pt.   Substance and Sexual Activity  . Alcohol use: Yes    Alcohol/week: 0.0 oz    Comment: 12-23-16 "I'll have 1-2 drinks maybe 3-4 times/yr"  . Drug use: No  . Sexual activity: Yes  Birth control/protection: Surgical  Lifestyle  . Physical activity:    Days per week: Not on file    Minutes per session: Not on file  . Stress: Not on file  Relationships  . Social connections:    Talks on phone: Not on file    Gets together: Not on file    Attends religious service: Not on file    Active member of club or organization: Not on file    Attends meetings of clubs or organizations: Not on file    Relationship status: Not on file  . Intimate partner violence:    Fear of current or ex partner: Not on file    Emotionally abused: Not on file    Physically abused: Not on file    Forced sexual activity: Not on file  Other Topics Concern  . Not on file  Social History Narrative   Daily caffiene use: 6  cups per day   Patient does not get regular excercise    Past Surgical History:  Procedure Laterality Date  . ABDOMINAL HYSTERECTOMY  04/2013  . BREAST BIOPSY Bilateral 10/05/2000  . BREAST EXCISIONAL BIOPSY Left 1991  . BREAST LUMPECTOMY Left ~ 1993  . CESAREAN SECTION  2002; 2006  . COLONOSCOPY    . LAPAROSCOPIC OVARIAN CYSTECTOMY  ~ 1999  . NASAL SEPTUM SURGERY  ~ 1991  . OOPHORECTOMY Right 2015  . TEMPOROMANDIBULAR JOINT SURGERY  ~ 42  . UPPER GASTROINTESTINAL ENDOSCOPY      Family History  Problem Relation Age of Onset  . Breast cancer Maternal Grandmother   . Prostate cancer Paternal Grandfather   . Colon polyps Paternal Grandfather   . Colon polyps Father   . Heart disease Father   . Diverticulitis Mother   . Colon polyps Paternal Grandmother   . Colon cancer Neg Hx   . Esophageal cancer Neg Hx   . Rectal cancer Neg Hx   . Stomach cancer Neg Hx   . Pancreatic cancer Neg Hx     Allergies  Allergen Reactions  . Promethazine Hcl Other (See Comments)    Violent tremors   . Cephalexin Diarrhea and Itching  . Codeine Other (See Comments)    Was a child when she took this.  . Dilaudid [Hydromorphone] Nausea And Vomiting  . Soy Allergy Itching  . Azithromycin Rash    Head to toe rash    Current Outpatient Medications on File Prior to Visit  Medication Sig Dispense Refill  . albuterol (PROVENTIL HFA;VENTOLIN HFA) 108 (90 Base) MCG/ACT inhaler Inhale 2 puffs into the lungs every 6 (six) hours as needed for wheezing or shortness of breath. (Patient taking differently: Inhale 2 puffs into the lungs as needed for wheezing or shortness of breath. ) 1 Inhaler 3  . azelastine (ASTELIN) 0.1 % nasal spray Place 2 sprays into both nostrils 2 (two) times daily. Use in each nostril as directed (Patient taking differently: Place 2 sprays into both nostrils as needed. Use in each nostril as directed) 30 mL 3  . beclomethasone (QVAR) 40 MCG/ACT inhaler Inhale 2 puffs into the  lungs 2 (two) times daily at 10 AM and 5 PM. (Patient taking differently: Inhale 2 puffs into the lungs as needed. ) 1 Inhaler 3  . conjugated estrogens (PREMARIN) vaginal cream as needed    . Cyanocobalamin (VITAMIN B-12) 1000 MCG SUBL Place under the tongue as needed.     . cyclobenzaprine (FLEXERIL) 10 MG tablet Take 10 mg by mouth as  needed.     . diclofenac (VOLTAREN) 75 MG EC tablet Take 1 tablet (75 mg total) by mouth 2 (two) times daily. 30 tablet 1  . guaiFENesin (MUCINEX) 600 MG 12 hr tablet Take 1,200 mg by mouth as needed.     . hydroxychloroquine (PLAQUENIL) 200 MG tablet Take 400 mg by mouth.    . hydrOXYzine (ATARAX/VISTARIL) 10 MG tablet Take 1 tablet (10 mg total) by mouth 3 (three) times daily as needed for itching. 30 tablet 0  . lansoprazole (PREVACID) 30 MG capsule Take 30 mg by mouth as needed.     . loratadine (CLARITIN) 10 MG tablet Take 1 tablet (10 mg total) by mouth daily. (Patient taking differently: Take 10 mg by mouth daily as needed. ) 30 tablet 0  . methocarbamol (ROBAXIN) 500 MG tablet Take 1 tablet (500 mg total) by mouth every 6 (six) hours as needed for muscle spasms. (Patient taking differently: Take 500 mg by mouth as needed for muscle spasms. ) 20 tablet 0  . montelukast (SINGULAIR) 10 MG tablet Take 1 tablet (10 mg total) by mouth at bedtime. (Patient taking differently: Take 10 mg by mouth at bedtime as needed. ) 30 tablet 2  . ranitidine (ZANTAC) 150 MG capsule Take 1 capsule (150 mg total) by mouth 2 (two) times daily. (Patient taking differently: Take 150 mg by mouth as needed. ) 60 capsule 0   No current facility-administered medications on file prior to visit.     BP 125/70   Pulse 85   Temp 98.4 F (36.9 C) (Oral)   Resp 16   Ht 5\' 4"  (1.626 m)   Wt 223 lb 12.8 oz (101.5 kg)   LMP 02/08/2013   SpO2 100%   BMI 38.42 kg/m      Objective:   Physical Exam  General  Mental Status - Alert. General Appearance - Well groomed. Not in acute  distress.  Skin Rashes- No Rashes.  HEENT Head- Normal. Ear Auditory Canal - Left- Normal. Right - Normal.Tympanic Membrane- Left- Normal. Right- Normal. Eye Sclera/Conjunctiva- Left- Normal. Right- Normal. Nose & Sinuses Nasal Mucosa- Left-  Boggy and Congested. Right-  Boggy and  Congested.Bilateral no  maxillary and no  frontal sinus pressure. Mouth & Throat Lips: Upper Lip- Normal: no dryness, cracking, pallor, cyanosis, or vesicular eruption. Lower Lip-Normal: no dryness, cracking, pallor, cyanosis or vesicular eruption. Buccal Mucosa- Bilateral- No Aphthous ulcers. Oropharynx- No Discharge or Erythema. Tonsils: Characteristics- Bilateral- No Erythema or Congestion. Size/Enlargement- Bilateral- No enlargement. Discharge- bilateral-None.  Neck Neck- Supple. No Masses.   Chest and Lung Exam Auscultation: Breath Sounds:-Clear even and unlabored.  Cardiovascular Auscultation:Rythm- Regular, rate and rhythm. Murmurs & Other Heart Sounds:Ausculatation of the heart reveal- No Murmurs.  Lymphatic Head & Neck General Head & Neck Lymphatics: Bilateral: Description- No Localized lymphadenopathy.   Abdomen Inspection:-Inspeection Normal. Palpation/Percussion:Note:No mass. Palpation and Percussion of the abdomen reveal- Non Tender, Non Distended + BS, no rebound or guarding.    Neurologic Cranial Nerve exam:- CN III-XII intact(No nystagmus), symmetric smile. Finger to Nose:- Normal/Intact Strength:- 5/5 equal and symmetric strength both upper and lower extremities.      Assessment & Plan:  You do have elevated blood pressure today and also blood pressure recently when you check with your dad I want machine.  I want you to start checking your blood pressure daily with over-the-counter wrist blood pressure cuff.  Write down those readings and follow-up in 2 weeks for blood pressure review.  If the  majority of your blood pressure readings are over 140/90 then would prescribe  blood pressure medication.  During the interim avoid any decongestants and reduce caffeine.  If your blood pressure levels are not elevated then would consider your reported head pressure sensation as possible headache.  In that event might consider CT imaging.  But on review of prior imaging 2016 MRI of brain was negative and CT of the head in 2017 was also negative.  Some sinus pressure sensation recently.  He thinks this might be related to allergies then you could use Flonase.  Prescription sent today.  For globus sensation with various activities including use of CPAP, will see what ENT thinks on this Monday's appointment.  That after reviewing that note I might refer you back to your former pulmonologist and get his opinion on possible changing equipment.(If by chance that could play a role in globus sensation?)  Follow-up in 2 weeks or as needed.  Mackie Pai, PA-C

## 2017-10-12 DIAGNOSIS — R0989 Other specified symptoms and signs involving the circulatory and respiratory systems: Secondary | ICD-10-CM | POA: Insufficient documentation

## 2017-10-12 DIAGNOSIS — F458 Other somatoform disorders: Secondary | ICD-10-CM | POA: Diagnosis not present

## 2017-10-12 DIAGNOSIS — R4702 Dysphasia: Secondary | ICD-10-CM | POA: Diagnosis not present

## 2017-10-12 DIAGNOSIS — G4733 Obstructive sleep apnea (adult) (pediatric): Secondary | ICD-10-CM | POA: Diagnosis not present

## 2017-10-12 DIAGNOSIS — R198 Other specified symptoms and signs involving the digestive system and abdomen: Secondary | ICD-10-CM | POA: Insufficient documentation

## 2017-10-12 DIAGNOSIS — K219 Gastro-esophageal reflux disease without esophagitis: Secondary | ICD-10-CM | POA: Diagnosis not present

## 2017-10-13 ENCOUNTER — Ambulatory Visit (INDEPENDENT_AMBULATORY_CARE_PROVIDER_SITE_OTHER): Payer: BLUE CROSS/BLUE SHIELD

## 2017-10-13 ENCOUNTER — Other Ambulatory Visit: Payer: Self-pay | Admitting: Sports Medicine

## 2017-10-13 ENCOUNTER — Ambulatory Visit (INDEPENDENT_AMBULATORY_CARE_PROVIDER_SITE_OTHER): Payer: BLUE CROSS/BLUE SHIELD | Admitting: Sports Medicine

## 2017-10-13 ENCOUNTER — Encounter: Payer: Self-pay | Admitting: Sports Medicine

## 2017-10-13 DIAGNOSIS — M722 Plantar fascial fibromatosis: Secondary | ICD-10-CM

## 2017-10-13 DIAGNOSIS — B351 Tinea unguium: Secondary | ICD-10-CM | POA: Diagnosis not present

## 2017-10-13 DIAGNOSIS — L603 Nail dystrophy: Secondary | ICD-10-CM | POA: Diagnosis not present

## 2017-10-13 DIAGNOSIS — M79672 Pain in left foot: Secondary | ICD-10-CM

## 2017-10-13 MED ORDER — METHYLPREDNISOLONE 4 MG PO TBPK: ORAL_TABLET | ORAL | 0 refills | Status: DC

## 2017-10-13 MED ORDER — MELOXICAM 15 MG PO TABS: 15.0000 mg | ORAL_TABLET | Freq: Every day | ORAL | 0 refills | Status: DC

## 2017-10-13 NOTE — Patient Instructions (Signed)
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
## 2017-10-15 ENCOUNTER — Telehealth: Payer: Self-pay | Admitting: *Deleted

## 2017-10-15 ENCOUNTER — Telehealth: Payer: Self-pay | Admitting: Pulmonary Disease

## 2017-10-15 DIAGNOSIS — G4733 Obstructive sleep apnea (adult) (pediatric): Secondary | ICD-10-CM

## 2017-10-15 NOTE — Telephone Encounter (Signed)
Called pt and there was no answer. No VM to leave message. Will try again later.

## 2017-10-15 NOTE — Telephone Encounter (Signed)
Okay to schedule home sleep study

## 2017-10-15 NOTE — Telephone Encounter (Signed)
Patient returned call, [phone].

## 2017-10-15 NOTE — Telephone Encounter (Signed)
-----   Message from Sueanne Margarita, MD sent at 10/14/2017  2:28 PM EDT ----- Regarding: RE: Sleep study I reviewed the chart and the patient saw Dr. Elsworth Soho with pulmonary back in the summer 2018 for her sleep apnea and he has been following that.  I would like her to continue to follow with him.  Fransico Him ----- Message ----- From: Freada Bergeron, CMA Sent: 10/14/2017   9:48 AM To: Sueanne Margarita, MD Subject: RE: Sleep study                                Patient had her sleep study several years ago (2010) referred by a urgent care doctor. Mackie Pai, PA-C sent her to doctor [facility] for severe breathing problems. Dr Purnell Shoemaker sent patient back to [facility] for dystolic dysfunction. ----- Message ----- From: Sueanne Margarita, MD Sent: 10/12/2017   6:12 PM To: Freada Bergeron, CMA Subject: RE: Sleep study                                I have not seen her for OSA in the past.  Who ordered her initial sleep study and CPAP  Traci ----- Message ----- From: Freada Bergeron, CMA Sent: 10/12/2017   5:00 PM To: Sueanne Margarita, MD Subject: Sleep study                                    Patient called today having issues with her cpap. She has not been seen in the office since 4/17 with Lyda Jester. Patient has not used her cpap in a month and she wondering if she needs a sleep study. Would you like to see her first or send for sleep study? Please advise

## 2017-10-15 NOTE — Telephone Encounter (Signed)
Spoke with pt, advised HST ordered. Pt understood, nothing further is needed.

## 2017-10-15 NOTE — Telephone Encounter (Signed)
Patient understands Dr Radford Pax has "reviewed the chart and the patient saw Dr. Elsworth Soho with pulmonary back in the summer 2018 for her sleep apnea and he has been following that.  I would like her to continue to follow with him.  Patient was unaware that she was suppose to return in 1 year  (01/01/18) to see Dr Elsworth Soho and she was informed by that office that ( You have enlarged tonsils - may need ENT evaluation).  Patient states she will call Dr Bari Mantis office to schedule an appointment to be seen.  Pt is agreeable to treatment.

## 2017-10-15 NOTE — Telephone Encounter (Signed)
Patient returned call Spoke with patient who reported she would like to have her sleep study repeated  Pt stated she has not worn her CPAP for about a month now d/t swallowing issues but her spouse reported that she stopped breathing and this has woken him up during the night.  She would like to see if her OSA has worsened  Patient denies any weight changes  Last office visit 12/2016 Dr Elsworth Soho please advise, thank you

## 2017-10-15 NOTE — Telephone Encounter (Signed)
Called pt but there was no answer. No VM to leave message.

## 2017-10-19 ENCOUNTER — Ambulatory Visit: Payer: BLUE CROSS/BLUE SHIELD | Admitting: Medical

## 2017-10-21 ENCOUNTER — Ambulatory Visit: Payer: BLUE CROSS/BLUE SHIELD | Admitting: Medical

## 2017-10-22 DIAGNOSIS — M19011 Primary osteoarthritis, right shoulder: Secondary | ICD-10-CM | POA: Diagnosis not present

## 2017-10-22 DIAGNOSIS — M25711 Osteophyte, right shoulder: Secondary | ICD-10-CM | POA: Diagnosis not present

## 2017-10-22 DIAGNOSIS — M7541 Impingement syndrome of right shoulder: Secondary | ICD-10-CM | POA: Diagnosis not present

## 2017-10-23 ENCOUNTER — Telehealth: Payer: Self-pay

## 2017-10-23 NOTE — Telephone Encounter (Signed)
Patient will call back after surgery to reschedule appt

## 2017-10-23 NOTE — Telephone Encounter (Signed)
Copied from [place] [id number]. Topic: Inquiry >> Oct 23, 2017  9:48 AM Arletha Grippe wrote: Reason for CRM: pt is having surgery on Wednesday and wants to know if provider is ok with pt rescheduling appt? She doesn't want to come in and be exposed to illness.   Cb is [phone]

## 2017-10-23 NOTE — Telephone Encounter (Signed)
Ok to reschedule

## 2017-10-26 ENCOUNTER — Ambulatory Visit: Payer: BLUE CROSS/BLUE SHIELD | Admitting: Medical

## 2017-10-27 ENCOUNTER — Other Ambulatory Visit: Payer: Self-pay | Admitting: Obstetrics and Gynecology

## 2017-10-27 ENCOUNTER — Telehealth: Payer: Self-pay | Admitting: *Deleted

## 2017-10-27 DIAGNOSIS — Z1231 Encounter for screening mammogram for malignant neoplasm of breast: Secondary | ICD-10-CM

## 2017-10-27 NOTE — Telephone Encounter (Signed)
Received call from [place] with the [facility] stating they are having problems with their fax and she still has not received below records. Requests that we fax most recent office note to her attn: at [phone], [phone] Ext [phone].  10/06/17 OV note faxed.

## 2017-10-27 NOTE — Telephone Encounter (Signed)
Received request for Medical records/Surgical Clearance via last OV note from [place]; sent via fax with confirmation/SLS 05/21

## 2017-10-28 DIAGNOSIS — M75111 Incomplete rotator cuff tear or rupture of right shoulder, not specified as traumatic: Secondary | ICD-10-CM | POA: Diagnosis not present

## 2017-10-28 DIAGNOSIS — M24111 Other articular cartilage disorders, right shoulder: Secondary | ICD-10-CM | POA: Diagnosis not present

## 2017-10-28 DIAGNOSIS — M7541 Impingement syndrome of right shoulder: Secondary | ICD-10-CM | POA: Diagnosis not present

## 2017-10-28 DIAGNOSIS — G8918 Other acute postprocedural pain: Secondary | ICD-10-CM | POA: Diagnosis not present

## 2017-10-28 DIAGNOSIS — M19011 Primary osteoarthritis, right shoulder: Secondary | ICD-10-CM | POA: Diagnosis not present

## 2017-10-29 ENCOUNTER — Other Ambulatory Visit: Payer: Self-pay | Admitting: Medical

## 2017-10-29 NOTE — Telephone Encounter (Signed)
Pt requesting medication to be phoned in to help with nausea. Notified pt that medication could not be phoned at this time and offered home care advice, to drink clear fluids(gatorade, ginger ale, sprite). Advised pt to seek treatment at Urgent Care if home care advice does not work during the night and nausea persist. Pt verbalized understanding.

## 2017-10-29 NOTE — Telephone Encounter (Signed)
Copied from [place] [id number]. Topic: Quick Communication - Rx Refill/Question >> Oct 29, 2017  6:51 PM Lorraine Conrad wrote: Medication: zofran  Pt called Conrad/c she had shoulder surgery yesterday and is nauseous and would like a medication called in for her, call pt to advise

## 2017-11-05 DIAGNOSIS — M25511 Pain in right shoulder: Secondary | ICD-10-CM | POA: Diagnosis not present

## 2017-11-05 DIAGNOSIS — Z9889 Other specified postprocedural states: Secondary | ICD-10-CM | POA: Diagnosis not present

## 2017-11-05 DIAGNOSIS — M25611 Stiffness of right shoulder, not elsewhere classified: Secondary | ICD-10-CM | POA: Diagnosis not present

## 2017-11-10 ENCOUNTER — Encounter: Payer: Self-pay | Admitting: Sports Medicine

## 2017-11-10 ENCOUNTER — Ambulatory Visit (INDEPENDENT_AMBULATORY_CARE_PROVIDER_SITE_OTHER): Payer: BLUE CROSS/BLUE SHIELD | Admitting: Sports Medicine

## 2017-11-10 DIAGNOSIS — M722 Plantar fascial fibromatosis: Secondary | ICD-10-CM

## 2017-11-10 DIAGNOSIS — Z9889 Other specified postprocedural states: Secondary | ICD-10-CM | POA: Diagnosis not present

## 2017-11-10 DIAGNOSIS — M25611 Stiffness of right shoulder, not elsewhere classified: Secondary | ICD-10-CM | POA: Diagnosis not present

## 2017-11-10 DIAGNOSIS — M7501 Adhesive capsulitis of right shoulder: Secondary | ICD-10-CM | POA: Diagnosis not present

## 2017-11-10 DIAGNOSIS — M79672 Pain in left foot: Secondary | ICD-10-CM | POA: Diagnosis not present

## 2017-11-10 DIAGNOSIS — M25511 Pain in right shoulder: Secondary | ICD-10-CM | POA: Diagnosis not present

## 2017-11-10 DIAGNOSIS — L603 Nail dystrophy: Secondary | ICD-10-CM | POA: Diagnosis not present

## 2017-11-10 NOTE — Progress Notes (Signed)
Subjective: Lorraine Conrad is a 47 y.o. female patient seen today in office for fungal culture results and follow up for left fasciitis. Reports that she is doing ok. No bad pain on left foot using brace and doing stretches; was unable to pick up medication because she had shoulder surgery and they told her not to take anything in addition to meds that she is on for her shoulder. Patient has no other pedal complaints at this time.   Patient Active Problem List   Diagnosis Date Noted  . Dysphasia 10/12/2017  . Globus pharyngeus 10/12/2017  . Paresthesias 04/14/2017  . Cervical radiculopathy 12/29/2016  . Lumbar radiculopathy 12/29/2016  . Diastolic dysfunction 30/86/5784  . Mass of ovary 07/12/2015  . History of laparoscopy 06/19/2015  . Left hip pain 05/24/2015  . Left knee pain 05/24/2015  . Left ankle pain 05/24/2015  . Chest wall pain 05/16/2015  . OSA (obstructive sleep apnea) 03/01/2015  . Leukocytosis 03/01/2015  . Leg weakness, bilateral 03/01/2015  . B12 deficiency 03/01/2015  . Allergic rhinitis 12/12/2014  . Dyspnea 12/12/2014  . SOB (shortness of breath) 10/30/2014  . Migraine 07/10/2014  . Hemorrhoid 07/10/2014  . H/O: hysterectomy 07/05/2014  . Arthralgia 05/08/2014  . Tachycardia 03/29/2014  . Tinnitus 03/27/2014  . Fatigue 03/27/2014  . Back pain 03/27/2014  . Shingles 03/09/2014  . Fever 03/09/2014  . Hot flashes 03/09/2014  . Meningitis, viral 03/02/2014  . Headache 03/01/2014  . Myalgia and myositis 02/23/2014  . Headache(784.0) 02/23/2014  . Endometriosis 02/04/2014  . Routine general medical examination at a health care facility 01/16/2014  . Psoriasis 01/16/2014  . Obesity (BMI 35.0-39.9 without comorbidity) 01/16/2014  . Neck pain 01/01/2014  . IC (interstitial cystitis) 11/14/2013  . Dysuria 10/11/2013  . Abnormal CBC 10/11/2013  . Watery eyes 10/11/2013  . Plantar fasciitis of left foot 03/18/2013  . IBS (irritable bowel syndrome) 03/18/2013  .  Nausea alone 02/17/2013  . PLEURISY 11/10/2008  . GASTRITIS 08/10/2008  . ANXIETY 01/06/2008  . PROCTALGIA FUGAX 01/06/2008  . ABDOMINAL PAIN-MULTIPLE SITES 01/06/2008  . GERD 11/15/2007  . CONSTIPATION 11/15/2007  . Chest pain 11/15/2007  . OVARIAN CYST, LEFT 11/11/2007    Current Outpatient Medications on File Prior to Visit  Medication Sig Dispense Refill  . albuterol (PROVENTIL HFA;VENTOLIN HFA) 108 (90 Base) MCG/ACT inhaler Inhale 2 puffs into the lungs every 6 (six) hours as needed for wheezing or shortness of breath. (Patient taking differently: Inhale 2 puffs into the lungs as needed for wheezing or shortness of breath. ) 1 Inhaler 3  . azelastine (ASTELIN) 0.1 % nasal spray Place 2 sprays into both nostrils 2 (two) times daily. Use in each nostril as directed (Patient taking differently: Place 2 sprays into both nostrils as needed. Use in each nostril as directed) 30 mL 3  . beclomethasone (QVAR) 40 MCG/ACT inhaler Inhale 2 puffs into the lungs 2 (two) times daily at 10 AM and 5 PM. (Patient taking differently: Inhale 2 puffs into the lungs as needed. ) 1 Inhaler 3  . conjugated estrogens (PREMARIN) vaginal cream as needed    . Cyanocobalamin (VITAMIN B-12) 1000 MCG SUBL Place under the tongue as needed.     . cyclobenzaprine (FLEXERIL) 10 MG tablet Take 10 mg by mouth as needed.     . diclofenac (VOLTAREN) 75 MG EC tablet Take 1 tablet (75 mg total) by mouth 2 (two) times daily. (Patient taking differently: Take 75 mg by mouth as needed. ) 30 tablet  1  . fluticasone (FLONASE) 50 MCG/ACT nasal spray Place 2 sprays into both nostrils daily. 16 g 1  . guaiFENesin (MUCINEX) 600 MG 12 hr tablet Take 1,200 mg by mouth as needed.     . hydroxychloroquine (PLAQUENIL) 200 MG tablet Take 400 mg by mouth.    . hydrOXYzine (ATARAX/VISTARIL) 10 MG tablet Take 1 tablet (10 mg total) by mouth 3 (three) times daily as needed for itching. 30 tablet 0  . lansoprazole (PREVACID) 30 MG capsule Take 30  mg by mouth as needed.     . loratadine (CLARITIN) 10 MG tablet Take 1 tablet (10 mg total) by mouth daily. (Patient taking differently: Take 10 mg by mouth daily as needed. ) 30 tablet 0  . meloxicam (MOBIC) 15 MG tablet Take 1 tablet (15 mg total) by mouth daily. 30 tablet 0  . methocarbamol (ROBAXIN) 500 MG tablet Take 1 tablet (500 mg total) by mouth every 6 (six) hours as needed for muscle spasms. (Patient taking differently: Take 500 mg by mouth as needed for muscle spasms. ) 20 tablet 0  . methylPREDNISolone (MEDROL DOSEPAK) 4 MG TBPK tablet Take as instructed 21 tablet 0  . montelukast (SINGULAIR) 10 MG tablet Take 1 tablet (10 mg total) by mouth at bedtime. (Patient taking differently: Take 10 mg by mouth at bedtime as needed. ) 30 tablet 2  . ranitidine (ZANTAC) 150 MG capsule Take 1 capsule (150 mg total) by mouth 2 (two) times daily. (Patient taking differently: Take 150 mg by mouth as needed. ) 60 capsule 0   No current facility-administered medications on file prior to visit.     Allergies  Allergen Reactions  . Promethazine Hcl Other (See Comments)    Violent tremors   . Cephalexin Diarrhea and Itching  . Codeine Other (See Comments)    Was a child when she took this.  . Dilaudid [Hydromorphone] Nausea And Vomiting  . Soy Allergy Itching  . Azithromycin Rash    Head to toe rash    Objective: Physical Exam  General: Well developed, nourished, no acute distress, awake, alert and oriented x 3  Vascular: Dorsalis pedis artery 2/4 bilateral, Posterior tibial artery 2/4 bilateral, skin temperature warm to warm proximal to distal bilateral lower extremities, no varicosities, pedal hair present bilateral.  Neurological: Gross sensation present via light touch bilateral.   Dermatological: Skin is warm, dry, and supple bilateral, Nails 1-10 are tender, short thick, and discolored especial 1& 5 with mild subungal debris, no webspace macerations present bilateral, no open lesions  present bilateral, no callus/corns/hyperkeratotic tissue present bilateral. No signs of infection bilateral.  Musculoskeletal: No  Current pain on left Muscular strength within normal limits without painon range of motion. No pain with calf compression bilateral.  Fungal culture + T Rubrum   Assessment and Plan:  Problem List Items Addressed This Visit    None    Visit Diagnoses    Nail dystrophy    -  Primary   Plantar fasciitis       Foot pain, left          -Examined patient -Discussed treatment options for painful mycotic nails -Patient opt for laser again for nails; Will have billing office to call her -Advised good hygiene habits -Recommend stretching and brace for left fasciitis and if worsens to pick up Rx of Mobic and Medrol  -Patient to return for laser or sooner if symptoms worsen.  Landis Martins, DPM

## 2017-11-17 DIAGNOSIS — Z9889 Other specified postprocedural states: Secondary | ICD-10-CM | POA: Diagnosis not present

## 2017-11-17 DIAGNOSIS — M25511 Pain in right shoulder: Secondary | ICD-10-CM | POA: Diagnosis not present

## 2017-11-17 DIAGNOSIS — M25611 Stiffness of right shoulder, not elsewhere classified: Secondary | ICD-10-CM | POA: Diagnosis not present

## 2017-11-17 DIAGNOSIS — M7501 Adhesive capsulitis of right shoulder: Secondary | ICD-10-CM | POA: Diagnosis not present

## 2017-11-19 ENCOUNTER — Ambulatory Visit (INDEPENDENT_AMBULATORY_CARE_PROVIDER_SITE_OTHER): Payer: BLUE CROSS/BLUE SHIELD | Admitting: Medical

## 2017-11-19 ENCOUNTER — Encounter: Payer: Self-pay | Admitting: Medical

## 2017-11-19 VITALS — BP 134/83 | HR 104 | Temp 99.1°F | Ht 64.0 in | Wt 216.2 lb

## 2017-11-19 DIAGNOSIS — R52 Pain, unspecified: Secondary | ICD-10-CM | POA: Diagnosis not present

## 2017-11-19 DIAGNOSIS — H6690 Otitis media, unspecified, unspecified ear: Secondary | ICD-10-CM

## 2017-11-19 DIAGNOSIS — Z9889 Other specified postprocedural states: Secondary | ICD-10-CM | POA: Diagnosis not present

## 2017-11-19 DIAGNOSIS — J0100 Acute maxillary sinusitis, unspecified: Secondary | ICD-10-CM | POA: Diagnosis not present

## 2017-11-19 DIAGNOSIS — R059 Cough, unspecified: Secondary | ICD-10-CM

## 2017-11-19 DIAGNOSIS — M25611 Stiffness of right shoulder, not elsewhere classified: Secondary | ICD-10-CM | POA: Diagnosis not present

## 2017-11-19 DIAGNOSIS — J029 Acute pharyngitis, unspecified: Secondary | ICD-10-CM | POA: Diagnosis not present

## 2017-11-19 DIAGNOSIS — M7501 Adhesive capsulitis of right shoulder: Secondary | ICD-10-CM | POA: Diagnosis not present

## 2017-11-19 DIAGNOSIS — R05 Cough: Secondary | ICD-10-CM | POA: Diagnosis not present

## 2017-11-19 DIAGNOSIS — J40 Bronchitis, not specified as acute or chronic: Secondary | ICD-10-CM | POA: Diagnosis not present

## 2017-11-19 DIAGNOSIS — M25511 Pain in right shoulder: Secondary | ICD-10-CM | POA: Diagnosis not present

## 2017-11-19 LAB — POCT RAPID STREP A (OFFICE): RAPID STREP A SCREEN: NEGATIVE

## 2017-11-19 LAB — POCT INFLUENZA A/B
Influenza A, POC: NEGATIVE
Influenza B, POC: NEGATIVE

## 2017-11-19 MED ORDER — DOXYCYCLINE HYCLATE 100 MG PO TABS: 100.0000 mg | ORAL_TABLET | Freq: Two times a day (BID) | ORAL | 0 refills | Status: DC

## 2017-11-19 MED ORDER — BENZONATATE 100 MG PO CAPS: 100.0000 mg | ORAL_CAPSULE | Freq: Three times a day (TID) | ORAL | 0 refills | Status: DC | PRN

## 2017-11-19 NOTE — Patient Instructions (Addendum)
You appear to have bronchitis and sinusitis(lt ear infection as well on exam). Rest hydrate and tylenol for fever. I am prescribing cough medicine benzonatate, and doxycyline  antibiotic. For your nasal congestion you could flonase nasal steroid.   Strep and flu test were negative.  You should gradually get better. If not then notify us and would recommend a chest xray.  Follow up in 7-10 days or as needed

## 2017-11-19 NOTE — Progress Notes (Signed)
Subjective:    Patient ID: Lorraine Conrad, female    DOB: 03-Jan-1971, 47 y.o.   MRN: [id number]  HPI  Pt in with 5 days of mid st, hoarse voice, productive cough and some mild body aches.   She describes can hear congestion in chest. And feels like has mucus to cough up at times but does note.  Pt has some chills and feeling hot at times.  Pt did have rapid flu and strep test negative today.  Nasal congestion and sinus pressure. Some left ear pain today. Also upper teeth felt mild painful bilaterally on driving here today.    Review of Systems  Constitutional: Positive for chills and fever. Negative for fatigue.  HENT: Positive for congestion, ear pain, sinus pressure, sinus pain and sore throat.   Respiratory: Positive for cough. Negative for shortness of breath and wheezing.   Cardiovascular: Negative for chest pain and palpitations.  Gastrointestinal: Negative for abdominal pain.  Musculoskeletal: Positive for myalgias.  Skin: Negative for rash.  Neurological: Negative for dizziness and headaches.  Hematological: Negative for adenopathy. Does not bruise/bleed easily.  Psychiatric/Behavioral: Negative for behavioral problems and confusion.   Past Medical History:  Diagnosis Date  . Allergy    SEASONAL  . Anemia   . Arthritis    "spine" (03/01/2014)  . Asthma   . Chronic lower back pain   . Endometriosis   . Erosive gastritis   . External hemorrhoids   . GERD (gastroesophageal reflux disease)   . H/O shortness of breath 2014   Cardiopulmonary exercise test results  . Headache    "probably monthly" (03/01/2014)  . Migraine 1998; 02/2014   "this one's lasted 10 days straight" (03/01/2014)  . OSA on CPAP   . Ovarian cyst, left   . Proctalgia fugax   . Sleep apnea    on cpap  . Thyroid disease    thyroid nodule     Social History   Socioeconomic History  . Marital status: Married    Spouse name: Not on file  . Number of children: 2  . Years of education: Not  on file  . Highest education level: Not on file  Occupational History  . Occupation: homemaker  Social Needs  . Financial resource strain: Not on file  . Food insecurity:    Worry: Not on file    Inability: Not on file  . Transportation needs:    Medical: Not on file    Non-medical: Not on file  Tobacco Use  . Smoking status: Never Smoker  . Smokeless tobacco: Never Used  . Tobacco comment: father smoked in house growing up per pt.   Substance and Sexual Activity  . Alcohol use: Yes    Alcohol/week: 0.0 oz    Comment: 12-23-16 "I'll have 1-2 drinks maybe 3-4 times/yr"  . Drug use: No  . Sexual activity: Yes    Birth control/protection: Surgical  Lifestyle  . Physical activity:    Days per week: Not on file    Minutes per session: Not on file  . Stress: Not on file  Relationships  . Social connections:    Talks on phone: Not on file    Gets together: Not on file    Attends religious service: Not on file    Active member of club or organization: Not on file    Attends meetings of clubs or organizations: Not on file    Relationship status: Not on file  . Intimate partner violence:  Fear of current or ex partner: Not on file    Emotionally abused: Not on file    Physically abused: Not on file    Forced sexual activity: Not on file  Other Topics Concern  . Not on file  Social History Narrative   Daily caffiene use: 6 cups per day   Patient does not get regular excercise    Past Surgical History:  Procedure Laterality Date  . ABDOMINAL HYSTERECTOMY  04/2013  . BREAST BIOPSY Bilateral 10/05/2000  . BREAST EXCISIONAL BIOPSY Left 1991  . BREAST LUMPECTOMY Left ~ 1993  . CESAREAN SECTION  2002; 2006  . COLONOSCOPY    . LAPAROSCOPIC OVARIAN CYSTECTOMY  ~ 1999  . NASAL SEPTUM SURGERY  ~ 1991  . OOPHORECTOMY Right 2015  . TEMPOROMANDIBULAR JOINT SURGERY  ~ 52  . UPPER GASTROINTESTINAL ENDOSCOPY      Family History  Problem Relation Age of Onset  . Breast cancer  Maternal Grandmother   . Prostate cancer Paternal Grandfather   . Colon polyps Paternal Grandfather   . Colon polyps Father   . Heart disease Father   . Diverticulitis Mother   . Colon polyps Paternal Grandmother   . Colon cancer Neg Hx   . Esophageal cancer Neg Hx   . Rectal cancer Neg Hx   . Stomach cancer Neg Hx   . Pancreatic cancer Neg Hx     Allergies  Allergen Reactions  . Promethazine Hcl Other (See Comments)    Violent tremors   . Cephalexin Diarrhea and Itching  . Codeine Other (See Comments)    Was a child when she took this.  . Dilaudid [Hydromorphone] Nausea And Vomiting  . Soy Allergy Itching  . Azithromycin Rash    Head to toe rash    Current Outpatient Medications on File Prior to Visit  Medication Sig Dispense Refill  . albuterol (PROVENTIL HFA;VENTOLIN HFA) 108 (90 Base) MCG/ACT inhaler Inhale 2 puffs into the lungs every 6 (six) hours as needed for wheezing or shortness of breath. (Patient taking differently: Inhale 2 puffs into the lungs as needed for wheezing or shortness of breath. ) 1 Inhaler 3  . azelastine (ASTELIN) 0.1 % nasal spray Place 2 sprays into both nostrils 2 (two) times daily. Use in each nostril as directed (Patient taking differently: Place 2 sprays into both nostrils as needed. Use in each nostril as directed) 30 mL 3  . beclomethasone (QVAR) 40 MCG/ACT inhaler Inhale 2 puffs into the lungs 2 (two) times daily at 10 AM and 5 PM. (Patient taking differently: Inhale 2 puffs into the lungs as needed. ) 1 Inhaler 3  . conjugated estrogens (PREMARIN) vaginal cream as needed    . Cyanocobalamin (VITAMIN B-12) 1000 MCG SUBL Place under the tongue as needed.     . cyclobenzaprine (FLEXERIL) 10 MG tablet Take 10 mg by mouth as needed.     . diclofenac (VOLTAREN) 75 MG EC tablet Take 1 tablet (75 mg total) by mouth 2 (two) times daily. (Patient taking differently: Take 75 mg by mouth as needed. ) 30 tablet 1  . fluticasone (FLONASE) 50 MCG/ACT nasal  spray Place 2 sprays into both nostrils daily. 16 g 1  . guaiFENesin (MUCINEX) 600 MG 12 hr tablet Take 1,200 mg by mouth as needed.     . hydroxychloroquine (PLAQUENIL) 200 MG tablet Take 400 mg by mouth.    . hydrOXYzine (ATARAX/VISTARIL) 10 MG tablet Take 1 tablet (10 mg total) by mouth 3 (  three) times daily as needed for itching. 30 tablet 0  . lansoprazole (PREVACID) 30 MG capsule Take 30 mg by mouth as needed.     . loratadine (CLARITIN) 10 MG tablet Take 1 tablet (10 mg total) by mouth daily. (Patient taking differently: Take 10 mg by mouth daily as needed. ) 30 tablet 0  . methocarbamol (ROBAXIN) 500 MG tablet Take 1 tablet (500 mg total) by mouth every 6 (six) hours as needed for muscle spasms. (Patient taking differently: Take 500 mg by mouth as needed for muscle spasms. ) 20 tablet 0  . montelukast (SINGULAIR) 10 MG tablet Take 1 tablet (10 mg total) by mouth at bedtime. (Patient taking differently: Take 10 mg by mouth at bedtime as needed. ) 30 tablet 2  . ranitidine (ZANTAC) 150 MG capsule Take 1 capsule (150 mg total) by mouth 2 (two) times daily. (Patient taking differently: Take 150 mg by mouth as needed. ) 60 capsule 0   No current facility-administered medications on file prior to visit.     BP 134/83 (BP Location: Left Arm, Patient Position: Sitting, Cuff Size: Large)   Pulse (!) 104   Temp 99.1 F (37.3 C) (Oral)   Ht 5\' 4"  (1.626 m)   Wt 216 lb 3.2 oz (98.1 kg)   LMP 02/08/2013   SpO2 100%   BMI 37.11 kg/m       Objective:   Physical Exam  General  Mental Status - Alert. General Appearance - Well groomed. Not in acute distress.  Skin Rashes- No Rashes.  HEENT Head- Normal. Ear Auditory Canal - Left- Normal. Right - Normal.Tympanic Membrane- Left- mild red tm. Right- Normal. Eye Sclera/Conjunctiva- Left- Normal. Right- Normal. Nose & Sinuses Nasal Mucosa- Left-  Boggy and Congested. Right-  Boggy and  Congested.Bilateral maxillary and frontal sinus  pressure. Mouth & Throat Lips: Upper Lip- Normal: no dryness, cracking, pallor, cyanosis, or vesicular eruption. Lower Lip-Normal: no dryness, cracking, pallor, cyanosis or vesicular eruption. Buccal Mucosa- Bilateral- No Aphthous ulcers. Oropharynx- No Discharge or Erythema. Tonsils: Characteristics- Bilateral- No Erythema or Congestion. Size/Enlargement- Bilateral- No enlargement. Discharge- bilateral-None.  Neck Neck- Supple. No Masses.   Chest and Lung Exam Auscultation: Breath Sounds:-Clear even and unlabored.  Cardiovascular Auscultation:Rythm- Regular, rate and rhythm. Murmurs & Other Heart Sounds:Ausculatation of the heart reveal- No Murmurs.  Lymphatic Head & Neck General Head & Neck Lymphatics: Bilateral: Description- No Localized lymphadenopathy.       Assessment & Plan:  You appear to have bronchitis and sinusitis(ear infection as well on exam). Rest hydrate and tylenol for fever. I am prescribing cough medicine benzonatate, and doxycyline  antibiotic. For your nasal congestion you could flonase nasal steroid.   Strep and flu test were negative.  You should gradually get better. If not then notify us and would recommend a chest xray.  Follow up in 7-10 days or as needed

## 2017-11-24 DIAGNOSIS — Z9889 Other specified postprocedural states: Secondary | ICD-10-CM | POA: Diagnosis not present

## 2017-11-24 DIAGNOSIS — M25511 Pain in right shoulder: Secondary | ICD-10-CM | POA: Diagnosis not present

## 2017-11-24 DIAGNOSIS — M25611 Stiffness of right shoulder, not elsewhere classified: Secondary | ICD-10-CM | POA: Diagnosis not present

## 2017-11-24 DIAGNOSIS — M7501 Adhesive capsulitis of right shoulder: Secondary | ICD-10-CM | POA: Diagnosis not present

## 2017-11-25 ENCOUNTER — Other Ambulatory Visit: Payer: Self-pay | Admitting: Medical

## 2017-11-25 DIAGNOSIS — M7501 Adhesive capsulitis of right shoulder: Secondary | ICD-10-CM | POA: Diagnosis not present

## 2017-11-25 DIAGNOSIS — Z9889 Other specified postprocedural states: Secondary | ICD-10-CM | POA: Diagnosis not present

## 2017-11-25 DIAGNOSIS — M25511 Pain in right shoulder: Secondary | ICD-10-CM | POA: Diagnosis not present

## 2017-11-25 DIAGNOSIS — M25611 Stiffness of right shoulder, not elsewhere classified: Secondary | ICD-10-CM | POA: Diagnosis not present

## 2017-11-25 NOTE — Telephone Encounter (Signed)
Requesting albuterol refill, medication has fallen off active med list. Routed to [facility], [place].

## 2017-11-27 ENCOUNTER — Ambulatory Visit: Payer: Self-pay | Admitting: *Deleted

## 2017-11-27 ENCOUNTER — Encounter: Payer: Self-pay | Admitting: Medical

## 2017-11-27 NOTE — Telephone Encounter (Signed)
Client called in left a message  c/o coughing and   Congestion.   Attempted to return call .  No answer at home number, attempted to call mobile number. Voice mailbox full.

## 2017-11-27 NOTE — Telephone Encounter (Signed)
Message from Synthia Innocent sent at 11/27/2017 12:51 PM EDT   Summary: cough, congestion, head tightness   Seen on 11/19/17, voice is still not back to normal, cough, congestion and head tightness           Pt called to discuss symptoms. Unable to leave message due to mailbox being full and not able to accept messages.

## 2017-11-27 NOTE — Telephone Encounter (Signed)
This encounter was created in error - please disregard.

## 2017-12-03 ENCOUNTER — Ambulatory Visit: Payer: BLUE CROSS/BLUE SHIELD

## 2017-12-03 DIAGNOSIS — M25511 Pain in right shoulder: Secondary | ICD-10-CM | POA: Diagnosis not present

## 2017-12-03 DIAGNOSIS — Z9889 Other specified postprocedural states: Secondary | ICD-10-CM | POA: Diagnosis not present

## 2017-12-03 DIAGNOSIS — M25611 Stiffness of right shoulder, not elsewhere classified: Secondary | ICD-10-CM | POA: Diagnosis not present

## 2017-12-03 DIAGNOSIS — M7501 Adhesive capsulitis of right shoulder: Secondary | ICD-10-CM | POA: Diagnosis not present

## 2017-12-08 DIAGNOSIS — Z9889 Other specified postprocedural states: Secondary | ICD-10-CM | POA: Diagnosis not present

## 2017-12-08 DIAGNOSIS — M25511 Pain in right shoulder: Secondary | ICD-10-CM | POA: Diagnosis not present

## 2017-12-08 DIAGNOSIS — M7501 Adhesive capsulitis of right shoulder: Secondary | ICD-10-CM | POA: Diagnosis not present

## 2017-12-08 DIAGNOSIS — M25611 Stiffness of right shoulder, not elsewhere classified: Secondary | ICD-10-CM | POA: Diagnosis not present

## 2017-12-09 ENCOUNTER — Telehealth: Payer: Self-pay | Admitting: Medical

## 2017-12-09 NOTE — Telephone Encounter (Signed)
Copied from [place] [id number]. Topic: General - Other >> Dec 09, 2017  3:48 PM Williams-Neal, Sade R wrote: Reason for CRM: Pt is calling in to state she still has congestion and bad cough. Asked if she wanted to make an appt she wants a prescription to be called in instead.

## 2017-12-10 NOTE — Telephone Encounter (Signed)
I saw this late Wednesday evening and think best for her to be seen on Friday. Will you contact her on Friday morning and see if she can come in.

## 2017-12-11 ENCOUNTER — Encounter: Payer: Self-pay | Admitting: Medical

## 2017-12-11 ENCOUNTER — Ambulatory Visit (INDEPENDENT_AMBULATORY_CARE_PROVIDER_SITE_OTHER): Payer: BLUE CROSS/BLUE SHIELD | Admitting: Medical

## 2017-12-11 ENCOUNTER — Ambulatory Visit (HOSPITAL_BASED_OUTPATIENT_CLINIC_OR_DEPARTMENT_OTHER)
Admission: RE | Admit: 2017-12-11 | Discharge: 2017-12-11 | Disposition: A | Discharge status: Home / Self Care | Payer: BLUE CROSS/BLUE SHIELD | Source: Ambulatory Visit | Attending: Medical | Admitting: Medical

## 2017-12-11 VITALS — BP 117/85 | HR 87 | Temp 98.5°F | Resp 16 | Ht 64.0 in | Wt 217.4 lb

## 2017-12-11 DIAGNOSIS — R05 Cough: Secondary | ICD-10-CM

## 2017-12-11 DIAGNOSIS — J40 Bronchitis, not specified as acute or chronic: Secondary | ICD-10-CM | POA: Diagnosis not present

## 2017-12-11 DIAGNOSIS — J301 Allergic rhinitis due to pollen: Secondary | ICD-10-CM

## 2017-12-11 DIAGNOSIS — J4520 Mild intermittent asthma, uncomplicated: Secondary | ICD-10-CM

## 2017-12-11 DIAGNOSIS — R059 Cough, unspecified: Secondary | ICD-10-CM

## 2017-12-11 MED ORDER — BENZONATATE 100 MG PO CAPS: 100.0000 mg | ORAL_CAPSULE | Freq: Three times a day (TID) | ORAL | 0 refills | Status: DC | PRN

## 2017-12-11 MED ORDER — LEVOCETIRIZINE DIHYDROCHLORIDE 5 MG PO TABS
5.0000 mg | ORAL_TABLET | Freq: Every evening | ORAL | 3 refills | Status: DC
Start: 2017-12-11 — End: 2018-06-17

## 2017-12-11 MED ORDER — SULFAMETHOXAZOLE-TRIMETHOPRIM 800-160 MG PO TABS: 1.0000 | ORAL_TABLET | Freq: Two times a day (BID) | ORAL | 0 refills | Status: DC

## 2017-12-11 MED ORDER — MONTELUKAST SODIUM 10 MG PO TABS: 10.0000 mg | ORAL_TABLET | Freq: Every day | ORAL | 3 refills | Status: DC

## 2017-12-11 MED ORDER — BECLOMETHASONE DIPROP HFA 40 MCG/ACT IN AERB: 2.0000 | INHALATION_SPRAY | Freq: Two times a day (BID) | RESPIRATORY_TRACT | 1 refills | Status: DC

## 2017-12-11 NOTE — Telephone Encounter (Signed)
Notified pt and scheduled OV today with Edward at 1:20pm.

## 2017-12-11 NOTE — Progress Notes (Signed)
Subjective:    Patient ID: Lorraine Conrad, female    DOB: 1970/11/10, 47 y.o.   MRN: [id number]  HPI  Pt in for evaluation.  She has had dry cough since I last saw her in June. She is still  nasal congested. She can feel pnd. Pt is on flonase. She is not on anithistamine or montelukast. She has history of allergies.  She does have history of allergic rhinitis but mostly in fall per her report.  Pt has not been wheezing but at times she feels sob with minimal activity. She states in past when she did use albuterol  seemed to help a lot.   Pt has upcoming surgery on December 23, 2017 for rotator cuff surgery. Also leaving for beach tomorrow.  Hysterctomy.    Review of Systems  Constitutional: Negative for chills, fatigue and fever.  HENT: Positive for congestion and postnasal drip. Negative for sinus pressure, sinus pain and sore throat.   Respiratory: Positive for cough and shortness of breath. Negative for chest tightness and wheezing.        At times not constant.   Some occasional shortness of breath. Responds to albuterol.  Cardiovascular: Negative for chest pain and palpitations.  Gastrointestinal: Negative for abdominal pain.  Musculoskeletal: Negative for back pain, myalgias and neck pain.       No leg pain.  Skin: Negative for rash.  Neurological: Negative for dizziness, tremors, speech difficulty, weakness, numbness and headaches.  Hematological: Negative for adenopathy. Does not bruise/bleed easily.  Psychiatric/Behavioral: Negative for behavioral problems, confusion and dysphoric mood. The patient is not nervous/anxious.    Past Medical History:  Diagnosis Date  . Allergy    SEASONAL  . Anemia   . Arthritis    "spine" (03/01/2014)  . Asthma   . Chronic lower back pain   . Endometriosis   . Erosive gastritis   . External hemorrhoids   . GERD (gastroesophageal reflux disease)   . H/O shortness of breath 2014   Cardiopulmonary exercise test results  . Headache      "probably monthly" (03/01/2014)  . Migraine 1998; 02/2014   "this one's lasted 10 days straight" (03/01/2014)  . OSA on CPAP   . Ovarian cyst, left   . Proctalgia fugax   . Sleep apnea    on cpap  . Thyroid disease    thyroid nodule     Social History   Socioeconomic History  . Marital status: Married    Spouse name: Not on file  . Number of children: 2  . Years of education: Not on file  . Highest education level: Not on file  Occupational History  . Occupation: homemaker  Social Needs  . Financial resource strain: Not on file  . Food insecurity:    Worry: Not on file    Inability: Not on file  . Transportation needs:    Medical: Not on file    Non-medical: Not on file  Tobacco Use  . Smoking status: Never Smoker  . Smokeless tobacco: Never Used  . Tobacco comment: father smoked in house growing up per pt.   Substance and Sexual Activity  . Alcohol use: Yes    Alcohol/week: 0.0 oz    Comment: 12-23-16 "I'll have 1-2 drinks maybe 3-4 times/yr"  . Drug use: No  . Sexual activity: Yes    Birth control/protection: Surgical  Lifestyle  . Physical activity:    Days per week: Not on file    Minutes per session:  Not on file  . Stress: Not on file  Relationships  . Social connections:    Talks on phone: Not on file    Gets together: Not on file    Attends religious service: Not on file    Active member of club or organization: Not on file    Attends meetings of clubs or organizations: Not on file    Relationship status: Not on file  . Intimate partner violence:    Fear of current or ex partner: Not on file    Emotionally abused: Not on file    Physically abused: Not on file    Forced sexual activity: Not on file  Other Topics Concern  . Not on file  Social History Narrative   Daily caffiene use: 6 cups per day   Patient does not get regular excercise    Past Surgical History:  Procedure Laterality Date  . ABDOMINAL HYSTERECTOMY  04/2013  . BREAST BIOPSY  Bilateral 10/05/2000  . BREAST EXCISIONAL BIOPSY Left 1991  . BREAST LUMPECTOMY Left ~ 1993  . CESAREAN SECTION  2002; 2006  . COLONOSCOPY    . LAPAROSCOPIC OVARIAN CYSTECTOMY  ~ 1999  . NASAL SEPTUM SURGERY  ~ 1991  . OOPHORECTOMY Right 2015  . TEMPOROMANDIBULAR JOINT SURGERY  ~ 51  . UPPER GASTROINTESTINAL ENDOSCOPY      Family History  Problem Relation Age of Onset  . Breast cancer Maternal Grandmother   . Prostate cancer Paternal Grandfather   . Colon polyps Paternal Grandfather   . Colon polyps Father   . Heart disease Father   . Diverticulitis Mother   . Colon polyps Paternal Grandmother   . Colon cancer Neg Hx   . Esophageal cancer Neg Hx   . Rectal cancer Neg Hx   . Stomach cancer Neg Hx   . Pancreatic cancer Neg Hx     Allergies  Allergen Reactions  . Promethazine Hcl Other (See Comments)    Violent tremors   . Cephalexin Diarrhea and Itching  . Codeine Other (See Comments)    Was a child when she took this.  . Dilaudid [Hydromorphone] Nausea And Vomiting  . Soy Allergy Itching  . Azithromycin Rash    Head to toe rash    Current Outpatient Medications on File Prior to Visit  Medication Sig Dispense Refill  . azelastine (ASTELIN) 0.1 % nasal spray Place 2 sprays into both nostrils 2 (two) times daily. Use in each nostril as directed (Patient taking differently: Place 2 sprays into both nostrils as needed. Use in each nostril as directed) 30 mL 3  . beclomethasone (QVAR) 40 MCG/ACT inhaler Inhale 2 puffs into the lungs 2 (two) times daily at 10 AM and 5 PM. (Patient taking differently: Inhale 2 puffs into the lungs as needed. ) 1 Inhaler 3  . benzonatate (TESSALON) 100 MG capsule Take 1 capsule (100 mg total) by mouth 3 (three) times daily as needed for cough. 30 capsule 0  . conjugated estrogens (PREMARIN) vaginal cream as needed    . Cyanocobalamin (VITAMIN B-12) 1000 MCG SUBL Place under the tongue as needed.     . cyclobenzaprine (FLEXERIL) 10 MG tablet  Take 10 mg by mouth as needed.     . diclofenac (VOLTAREN) 75 MG EC tablet Take 1 tablet (75 mg total) by mouth 2 (two) times daily. (Patient taking differently: Take 75 mg by mouth as needed. ) 30 tablet 1  . doxycycline (VIBRA-TABS) 100 MG tablet Take 1 tablet (  100 mg total) by mouth 2 (two) times daily. Can give capsules or generic 20 tablet 0  . fluticasone (FLONASE) 50 MCG/ACT nasal spray Place 2 sprays into both nostrils daily. 16 g 1  . guaiFENesin (MUCINEX) 600 MG 12 hr tablet Take 1,200 mg by mouth as needed.     . hydroxychloroquine (PLAQUENIL) 200 MG tablet Take 400 mg by mouth.    . hydrOXYzine (ATARAX/VISTARIL) 10 MG tablet Take 1 tablet (10 mg total) by mouth 3 (three) times daily as needed for itching. 30 tablet 0  . lansoprazole (PREVACID) 30 MG capsule Take 30 mg by mouth as needed.     . loratadine (CLARITIN) 10 MG tablet Take 1 tablet (10 mg total) by mouth daily. (Patient taking differently: Take 10 mg by mouth daily as needed. ) 30 tablet 0  . methocarbamol (ROBAXIN) 500 MG tablet Take 1 tablet (500 mg total) by mouth every 6 (six) hours as needed for muscle spasms. (Patient taking differently: Take 500 mg by mouth as needed for muscle spasms. ) 20 tablet 0  . montelukast (SINGULAIR) 10 MG tablet Take 1 tablet (10 mg total) by mouth at bedtime. (Patient taking differently: Take 10 mg by mouth at bedtime as needed. ) 30 tablet 2  . PROAIR HFA 108 (90 Base) MCG/ACT inhaler USE 2 PUFFS INTO LUNGS EVERY 6 HOURS AS NEEDED FOR WHEEZING OR SHORTNESS OF BREATH 8.5 Inhaler 3  . ranitidine (ZANTAC) 150 MG capsule Take 1 capsule (150 mg total) by mouth 2 (two) times daily. (Patient taking differently: Take 150 mg by mouth as needed. ) 60 capsule 0   No current facility-administered medications on file prior to visit.     BP 117/85   Pulse 87   Temp 98.5 F (36.9 C) (Oral)   Resp 16   Ht 5\' 4"  (1.626 m)   Wt 217 lb 6.4 oz (98.6 kg)   LMP 02/08/2013   SpO2 100%   BMI 37.32 kg/m        Objective:   Physical Exam  General  Mental Status - Alert. General Appearance - Well groomed. Not in acute distress.  Skin Rashes- No Rashes.  HEENT Head- Normal. Ear Auditory Canal - Left- Normal. Right - Normal.Tympanic Membrane- Left- Normal. Right- Normal. Eye Sclera/Conjunctiva- Left- Normal. Right- Normal. Nose & Sinuses Nasal Mucosa- Left-  Boggy and Congested. Right-  Boggy and  Congested.Bilateral maxillary and frontal sinus pressure. Mouth & Throat Lips: Upper Lip- Normal: no dryness, cracking, pallor, cyanosis, or vesicular eruption. Lower Lip-Normal: no dryness, cracking, pallor, cyanosis or vesicular eruption. Buccal Mucosa- Bilateral- No Aphthous ulcers. Oropharynx- No Discharge or Erythema. +pnd Tonsils: Characteristics- Bilateral- No Erythema or Congestion. Size/Enlargement- Bilateral- No enlargement. Discharge- bilateral-None.  Neck Neck- Supple. No Masses.   Chest and Lung Exam Auscultation: Breath Sounds:-Clear even and unlabored.  Cardiovascular Auscultation:Rythm- Regular, rate and rhythm. Murmurs & Other Heart Sounds:Ausculatation of the heart reveal- No Murmurs.  Lymphatic Head & Neck General Head & Neck Lymphatics: Bilateral: Description- No Localized lymphadenopathy.  Lower ext- no pedal edema, negative homans signs.       Assessment & Plan:  For your recent persisting cough x1 month, I do want to get chest x-ray.  Also this may represent some mild reactive airway disease.  If you have cough despite the use of allergy medications and benzonatate then I want you to use albuterol as directed.  If you find that you are having to use albuterol on a repetitive basis then add  Qvar inhaler.  You do appear to have some probable allergic rhinitis symptoms.  Prescription of Xyzal and montelukast sent to pharmacy.  Also continue your Flonase.  Possible bronchitis and since you have upcoming surgery in the middle of the month, I do think it is a  good idea for you to go ahead and start antibiotic Bactrim DS.  Please get chest x-ray today.  Follow-up in 7 to 10 days or as needed.  Mackie Pai, PA-C

## 2017-12-11 NOTE — Patient Instructions (Signed)
For your recent persisting cough x1 month, I do want to get chest x-ray.  Also this may represent some mild reactive airway disease.  If you have cough despite the use of allergy medications and benzonatate then I want you to use albuterol as directed.  If you find that you are having to use albuterol on a repetitive basis then add Qvar inhaler.  You do appear to have some probable allergic rhinitis symptoms.  Prescription of Xyzal and montelukast sent to pharmacy.  Also continue your Flonase.  Possible bronchitis and since you have upcoming surgery in the middle of the month, I do think it is a good idea for you to go ahead and start antibiotic Bactrim DS.  Please get chest x-ray today.  Follow-up in 7 to 10 days or as needed.

## 2017-12-14 ENCOUNTER — Telehealth: Payer: Self-pay

## 2017-12-14 NOTE — Telephone Encounter (Signed)
Error

## 2017-12-21 ENCOUNTER — Other Ambulatory Visit: Payer: BLUE CROSS/BLUE SHIELD

## 2017-12-22 ENCOUNTER — Telehealth: Payer: Self-pay | Admitting: Pulmonary Disease

## 2017-12-22 DIAGNOSIS — R0602 Shortness of breath: Secondary | ICD-10-CM

## 2017-12-22 DIAGNOSIS — G4733 Obstructive sleep apnea (adult) (pediatric): Secondary | ICD-10-CM

## 2017-12-22 NOTE — Telephone Encounter (Signed)
Called and spoke with pt regarding pt is requesting a inlab sleep study not a HST Pt is requesting a in depth sleep study; did not want HST  RA please advise.

## 2017-12-23 DIAGNOSIS — M7501 Adhesive capsulitis of right shoulder: Secondary | ICD-10-CM | POA: Diagnosis not present

## 2017-12-23 DIAGNOSIS — G8918 Other acute postprocedural pain: Secondary | ICD-10-CM | POA: Diagnosis not present

## 2017-12-23 NOTE — Telephone Encounter (Signed)
Called patient, unable to reach.  Unable to leave voicemail due to being full. Will call back.

## 2017-12-23 NOTE — Telephone Encounter (Signed)
Okay to schedule split study

## 2017-12-24 DIAGNOSIS — M25611 Stiffness of right shoulder, not elsewhere classified: Secondary | ICD-10-CM | POA: Diagnosis not present

## 2017-12-24 DIAGNOSIS — M7501 Adhesive capsulitis of right shoulder: Secondary | ICD-10-CM | POA: Diagnosis not present

## 2017-12-24 DIAGNOSIS — Z9889 Other specified postprocedural states: Secondary | ICD-10-CM | POA: Diagnosis not present

## 2017-12-24 NOTE — Telephone Encounter (Signed)
Patient calling back- advised that RA agreed to do split study - she would like to proceed . She can be reached at [phone] - if she doesn't answer please call her husband Corene Cornea at [phone]

## 2017-12-24 NOTE — Telephone Encounter (Signed)
Attempted to call pt. I did not receive an answer. I could not leave a message due to her voicemail being full. Will try back later.

## 2017-12-24 NOTE — Telephone Encounter (Signed)
I have pt scheduled at  Sleep Lab for 8/16.  I called pt & gave her appt info.

## 2017-12-24 NOTE — Telephone Encounter (Signed)
Order has been placed for patient, unable to reach left message to give [place] a call back.

## 2017-12-25 DIAGNOSIS — Z9889 Other specified postprocedural states: Secondary | ICD-10-CM | POA: Diagnosis not present

## 2017-12-25 DIAGNOSIS — M25611 Stiffness of right shoulder, not elsewhere classified: Secondary | ICD-10-CM | POA: Diagnosis not present

## 2017-12-25 DIAGNOSIS — M7501 Adhesive capsulitis of right shoulder: Secondary | ICD-10-CM | POA: Diagnosis not present

## 2017-12-25 DIAGNOSIS — M25511 Pain in right shoulder: Secondary | ICD-10-CM | POA: Diagnosis not present

## 2017-12-28 DIAGNOSIS — M7501 Adhesive capsulitis of right shoulder: Secondary | ICD-10-CM | POA: Diagnosis not present

## 2017-12-28 DIAGNOSIS — Z9889 Other specified postprocedural states: Secondary | ICD-10-CM | POA: Diagnosis not present

## 2017-12-28 DIAGNOSIS — M25611 Stiffness of right shoulder, not elsewhere classified: Secondary | ICD-10-CM | POA: Diagnosis not present

## 2017-12-28 DIAGNOSIS — M25511 Pain in right shoulder: Secondary | ICD-10-CM | POA: Diagnosis not present

## 2017-12-30 DIAGNOSIS — M25611 Stiffness of right shoulder, not elsewhere classified: Secondary | ICD-10-CM | POA: Diagnosis not present

## 2017-12-30 DIAGNOSIS — M25511 Pain in right shoulder: Secondary | ICD-10-CM | POA: Diagnosis not present

## 2017-12-30 DIAGNOSIS — Z9889 Other specified postprocedural states: Secondary | ICD-10-CM | POA: Diagnosis not present

## 2017-12-30 DIAGNOSIS — M7501 Adhesive capsulitis of right shoulder: Secondary | ICD-10-CM | POA: Diagnosis not present

## 2018-01-01 DIAGNOSIS — M7501 Adhesive capsulitis of right shoulder: Secondary | ICD-10-CM | POA: Diagnosis not present

## 2018-01-01 DIAGNOSIS — Z9889 Other specified postprocedural states: Secondary | ICD-10-CM | POA: Diagnosis not present

## 2018-01-01 DIAGNOSIS — M25611 Stiffness of right shoulder, not elsewhere classified: Secondary | ICD-10-CM | POA: Diagnosis not present

## 2018-01-01 DIAGNOSIS — M25511 Pain in right shoulder: Secondary | ICD-10-CM | POA: Diagnosis not present

## 2018-01-04 DIAGNOSIS — M7501 Adhesive capsulitis of right shoulder: Secondary | ICD-10-CM | POA: Diagnosis not present

## 2018-01-04 DIAGNOSIS — Z9889 Other specified postprocedural states: Secondary | ICD-10-CM | POA: Diagnosis not present

## 2018-01-04 DIAGNOSIS — M25511 Pain in right shoulder: Secondary | ICD-10-CM | POA: Diagnosis not present

## 2018-01-04 DIAGNOSIS — M25611 Stiffness of right shoulder, not elsewhere classified: Secondary | ICD-10-CM | POA: Diagnosis not present

## 2018-01-07 DIAGNOSIS — M7501 Adhesive capsulitis of right shoulder: Secondary | ICD-10-CM | POA: Diagnosis not present

## 2018-01-07 DIAGNOSIS — M25611 Stiffness of right shoulder, not elsewhere classified: Secondary | ICD-10-CM | POA: Diagnosis not present

## 2018-01-07 DIAGNOSIS — Z9889 Other specified postprocedural states: Secondary | ICD-10-CM | POA: Diagnosis not present

## 2018-01-07 DIAGNOSIS — M25511 Pain in right shoulder: Secondary | ICD-10-CM | POA: Diagnosis not present

## 2018-01-13 DIAGNOSIS — M7501 Adhesive capsulitis of right shoulder: Secondary | ICD-10-CM | POA: Diagnosis not present

## 2018-01-13 DIAGNOSIS — Z9889 Other specified postprocedural states: Secondary | ICD-10-CM | POA: Diagnosis not present

## 2018-01-13 DIAGNOSIS — M25511 Pain in right shoulder: Secondary | ICD-10-CM | POA: Diagnosis not present

## 2018-01-13 DIAGNOSIS — M25611 Stiffness of right shoulder, not elsewhere classified: Secondary | ICD-10-CM | POA: Diagnosis not present

## 2018-01-15 ENCOUNTER — Ambulatory Visit
Admission: RE | Admit: 2018-01-15 | Discharge: 2018-01-15 | Disposition: A | Discharge status: Home / Self Care | Payer: BLUE CROSS/BLUE SHIELD | Source: Ambulatory Visit | Attending: Obstetrics and Gynecology | Admitting: Obstetrics and Gynecology

## 2018-01-15 DIAGNOSIS — M7501 Adhesive capsulitis of right shoulder: Secondary | ICD-10-CM | POA: Diagnosis not present

## 2018-01-15 DIAGNOSIS — M25511 Pain in right shoulder: Secondary | ICD-10-CM | POA: Diagnosis not present

## 2018-01-15 DIAGNOSIS — Z1231 Encounter for screening mammogram for malignant neoplasm of breast: Secondary | ICD-10-CM | POA: Diagnosis not present

## 2018-01-15 DIAGNOSIS — Z9889 Other specified postprocedural states: Secondary | ICD-10-CM | POA: Diagnosis not present

## 2018-01-15 DIAGNOSIS — M25611 Stiffness of right shoulder, not elsewhere classified: Secondary | ICD-10-CM | POA: Diagnosis not present

## 2018-01-18 ENCOUNTER — Other Ambulatory Visit: Payer: Self-pay | Admitting: Obstetrics and Gynecology

## 2018-01-18 DIAGNOSIS — R928 Other abnormal and inconclusive findings on diagnostic imaging of breast: Secondary | ICD-10-CM

## 2018-01-19 ENCOUNTER — Other Ambulatory Visit: Payer: Self-pay | Admitting: Obstetrics and Gynecology

## 2018-01-25 ENCOUNTER — Ambulatory Visit
Admission: RE | Admit: 2018-01-25 | Discharge: 2018-01-25 | Disposition: A | Discharge status: Home / Self Care | Payer: BLUE CROSS/BLUE SHIELD | Source: Ambulatory Visit | Attending: Obstetrics and Gynecology | Admitting: Obstetrics and Gynecology

## 2018-01-25 DIAGNOSIS — R928 Other abnormal and inconclusive findings on diagnostic imaging of breast: Secondary | ICD-10-CM

## 2018-01-25 DIAGNOSIS — M7501 Adhesive capsulitis of right shoulder: Secondary | ICD-10-CM | POA: Diagnosis not present

## 2018-01-25 DIAGNOSIS — R922 Inconclusive mammogram: Secondary | ICD-10-CM | POA: Diagnosis not present

## 2018-01-25 DIAGNOSIS — M25511 Pain in right shoulder: Secondary | ICD-10-CM | POA: Diagnosis not present

## 2018-01-25 DIAGNOSIS — Z9889 Other specified postprocedural states: Secondary | ICD-10-CM | POA: Diagnosis not present

## 2018-01-25 DIAGNOSIS — M25611 Stiffness of right shoulder, not elsewhere classified: Secondary | ICD-10-CM | POA: Diagnosis not present

## 2018-01-25 DIAGNOSIS — N6320 Unspecified lump in the left breast, unspecified quadrant: Secondary | ICD-10-CM | POA: Diagnosis not present

## 2018-01-27 DIAGNOSIS — Z9889 Other specified postprocedural states: Secondary | ICD-10-CM | POA: Diagnosis not present

## 2018-01-27 DIAGNOSIS — M25511 Pain in right shoulder: Secondary | ICD-10-CM | POA: Diagnosis not present

## 2018-01-27 DIAGNOSIS — M25611 Stiffness of right shoulder, not elsewhere classified: Secondary | ICD-10-CM | POA: Diagnosis not present

## 2018-01-27 DIAGNOSIS — M7501 Adhesive capsulitis of right shoulder: Secondary | ICD-10-CM | POA: Diagnosis not present

## 2018-02-02 DIAGNOSIS — Z9889 Other specified postprocedural states: Secondary | ICD-10-CM | POA: Diagnosis not present

## 2018-02-02 DIAGNOSIS — M25611 Stiffness of right shoulder, not elsewhere classified: Secondary | ICD-10-CM | POA: Diagnosis not present

## 2018-02-02 DIAGNOSIS — M7501 Adhesive capsulitis of right shoulder: Secondary | ICD-10-CM | POA: Diagnosis not present

## 2018-02-02 DIAGNOSIS — M25511 Pain in right shoulder: Secondary | ICD-10-CM | POA: Diagnosis not present

## 2018-02-04 ENCOUNTER — Ambulatory Visit (HOSPITAL_BASED_OUTPATIENT_CLINIC_OR_DEPARTMENT_OTHER): Payer: BLUE CROSS/BLUE SHIELD | Attending: Pulmonary Disease | Admitting: Pulmonary Disease

## 2018-02-04 ENCOUNTER — Encounter

## 2018-02-04 VITALS — Ht 64.0 in | Wt 217.0 lb

## 2018-02-04 DIAGNOSIS — G4733 Obstructive sleep apnea (adult) (pediatric): Secondary | ICD-10-CM | POA: Diagnosis not present

## 2018-02-05 ENCOUNTER — Other Ambulatory Visit: Payer: BLUE CROSS/BLUE SHIELD

## 2018-02-11 ENCOUNTER — Telehealth: Payer: Self-pay | Admitting: Pulmonary Disease

## 2018-02-11 NOTE — Telephone Encounter (Signed)
Called and spoke with Patient.  HST results were not available at this time.  Patient is aware that someone will contact her as soon as they are available.  Understanding stated.  Nothing further.

## 2018-02-17 ENCOUNTER — Telehealth: Payer: Self-pay | Admitting: Pulmonary Disease

## 2018-02-17 DIAGNOSIS — G4733 Obstructive sleep apnea (adult) (pediatric): Secondary | ICD-10-CM

## 2018-02-17 NOTE — Telephone Encounter (Signed)
ATC patient but she did not answer. Her VM was full at the time of call. Will attempt to call her back to make her aware that RA is out of the office and will be back on Monday to review her results.

## 2018-02-17 NOTE — Telephone Encounter (Signed)
Pt is returning call. Pt contact number [phone]

## 2018-02-17 NOTE — Telephone Encounter (Signed)
Called and spoke with pt letting her know that the results are still not yet available that once we have the results, we would call her to let her know the results.  Routing to [place] for her to follow up on.

## 2018-02-17 NOTE — Telephone Encounter (Signed)
ATC number below but no one answered. Did not leave a VM since the name attached to the VM was not the patient nor anyone listed on her DPR.   Will attempt to call her back later.

## 2018-02-18 NOTE — Telephone Encounter (Signed)
Called spoke with patient, informed her that RA is not in the office but will return on 9.16.19 to review her results This is the second time that patient has called for her sleep study results while RA has not been in the office  Patient is okay with waiting until next week when RA returns  Dr Elsworth Soho please advise on sleep study results, thank you.

## 2018-02-22 DIAGNOSIS — G4733 Obstructive sleep apnea (adult) (pediatric): Secondary | ICD-10-CM

## 2018-02-22 NOTE — Telephone Encounter (Signed)
Mod OSA 27/h, mostly in supine position Will need CPAP therapy - can do formal titration again in lab or trial autoCPAP 5-15 cm, nasal N20 mask OV in 6 wks

## 2018-02-22 NOTE — Procedures (Signed)
  Patient Name: Lorraine Conrad, Lorraine Conrad Date: 02/04/2018   Gender: Female  D.O.B: 1970/08/13  Age (years): 89  Referring Provider: Kara Mead MD, ABSM  Height (inches): 64  Interpreting Physician: Kara Mead MD, ABSM  Weight (lbs): 217  RPSGT: Baxter Flattery  BMI: 37  MRN: [id number]  Neck Size: 15.00  <br> <br>  CLINICAL INFORMATION  Sleep Study Type: NPSG Indication for sleep study: Fatigue, Obesity, Snoring, Witnesses Apnea / Gasping During Sleep Epworth Sleepiness Score: 9 SLEEP STUDY TECHNIQUE  As per the AASM Manual for the Scoring of Sleep and Associated Events v2.3 (April 2016) with a hypopnea requiring 4% desaturations. The channels recorded and monitored were frontal, central and occipital EEG, electrooculogram (EOG), submentalis EMG (chin), nasal and oral airflow, thoracic and abdominal wall motion, anterior tibialis EMG, snore microphone, electrocardiogram, and pulse oximetry. MEDICATIONS  Medications self-administered by patient taken the night of the study : N/A SLEEP ARCHITECTURE  The study was initiated at 10:06:47 PM and ended at 4:41:22 AM. Sleep onset time was 44.4 minutes and the sleep efficiency was 71.5%%. The total sleep time was 282 minutes. Stage REM latency was 181.0 minutes. The patient spent 3.5%% of the night in stage N1 sleep, 66.0%% in stage N2 sleep, 19.3%% in stage N3 and 11.2% in REM. Alpha intrusion was absent. Supine sleep was 72.28%. RESPIRATORY PARAMETERS  The overall apnea/hypopnea index (AHI) was 27.9 per hour. There were 19 total apneas, including 18 obstructive, 1 central and 0 mixed apneas. There were 112 hypopneas and 11 RERAs. The AHI during Stage REM sleep was 64.8 per hour. AHI while supine was 36.5 per hour. The mean oxygen saturation was 93.1%. The minimum SpO2 during sleep was 83.0%. loud snoring was noted during this study. CARDIAC DATA  The 2 lead EKG demonstrated sinus rhythm. The mean heart rate was 82.0 beats per minute. Other  EKG findings include: None.  LEG MOVEMENT DATA  The total PLMS were 0 with a resulting PLMS index of 0.0. Associated arousal with leg movement index was 0.0 . IMPRESSIONS  - Moderate obstructive sleep apnea occurred during this study (AHI = 27.9/h). - No significant central sleep apnea occurred during this study (CAI = 0.2/h).  - Mild oxygen desaturation was noted during this study (Min O2 = 83.0%).  - The patient snored with loud snoring volume.  - No cardiac abnormalities were noted during this study.  - Clinically significant periodic limb movements did not occur during sleep. No significant associated arousals. DIAGNOSIS  - Obstructive Sleep Apnea (327.23 [G47.33 ICD-10]) RECOMMENDATIONS  - Therapeutic CPAP titration to determine optimal pressure required to alleviate sleep disordered breathing. Alternatively autoCPAP can be tried. - Positional therapy avoiding supine position during sleep.  - Avoid alcohol, sedatives and other CNS depressants that may worsen sleep apnea and disrupt normal sleep architecture.  - Sleep hygiene should be reviewed to assess factors that may improve sleep quality.  - Weight management and regular exercise should be initiated or continued if appropriate.   Kara Mead MD Board Certified in [place]

## 2018-02-23 NOTE — Telephone Encounter (Signed)
ATC pt, no answer. Left message for pt to call back.  

## 2018-02-23 NOTE — Telephone Encounter (Signed)
Called pt but there was no answer. Unable to leave message since VM is full.

## 2018-02-23 NOTE — Telephone Encounter (Signed)
Pt is calling back [phone]

## 2018-02-23 NOTE — Telephone Encounter (Signed)
Pt is calling back. Cb is [phone] Pt will unavail until 1:30.

## 2018-02-23 NOTE — Telephone Encounter (Signed)
Called and spoke with patient, made aware of Sleep Study results, voiced understanding. Order placed for new cpap. Nothing further needed at this time.

## 2018-03-03 ENCOUNTER — Telehealth: Payer: Self-pay | Admitting: Pulmonary Disease

## 2018-03-03 NOTE — Telephone Encounter (Signed)
Called and spoke with Lorraine Conrad she stated that they did not receive the order. Order was placed by [place] on the 17th with a community message sent. [facility] can you please send this again to confirm that they did receive it. Thank you.   Lorraine Conrad said she will pull it up from Epic now.   Patient is aware they are processing this. Nothing further needed.

## 2018-03-31 DIAGNOSIS — G43111 Migraine with aura, intractable, with status migrainosus: Secondary | ICD-10-CM | POA: Diagnosis not present

## 2018-03-31 DIAGNOSIS — H5319 Other subjective visual disturbances: Secondary | ICD-10-CM | POA: Diagnosis not present

## 2018-04-15 ENCOUNTER — Encounter: Payer: Self-pay | Admitting: Medical

## 2018-04-15 ENCOUNTER — Ambulatory Visit (INDEPENDENT_AMBULATORY_CARE_PROVIDER_SITE_OTHER): Payer: BLUE CROSS/BLUE SHIELD | Admitting: Medical

## 2018-04-15 VITALS — BP 139/84 | HR 86 | Temp 98.2°F | Resp 16 | Ht 64.0 in | Wt 220.4 lb

## 2018-04-15 DIAGNOSIS — R1032 Left lower quadrant pain: Secondary | ICD-10-CM

## 2018-04-15 DIAGNOSIS — M549 Dorsalgia, unspecified: Secondary | ICD-10-CM

## 2018-04-15 LAB — POC URINALSYSI DIPSTICK (AUTOMATED)
Bilirubin, UA: NEGATIVE
Blood, UA: NEGATIVE
GLUCOSE UA: NEGATIVE
Ketones, UA: NEGATIVE
LEUKOCYTES UA: NEGATIVE
NITRITE UA: NEGATIVE
PROTEIN UA: NEGATIVE
SPEC GRAV UA: 1.01 (ref 1.010–1.025)
UROBILINOGEN UA: 0.2 U/dL
pH, UA: 6 (ref 5.0–8.0)

## 2018-04-15 MED ORDER — CIPROFLOXACIN HCL 500 MG PO TABS: 500.0000 mg | ORAL_TABLET | Freq: Two times a day (BID) | ORAL | 0 refills | Status: DC

## 2018-04-15 MED ORDER — ONDANSETRON 8 MG PO TBDP: 8.0000 mg | ORAL_TABLET | Freq: Three times a day (TID) | ORAL | 0 refills | Status: DC | PRN

## 2018-04-15 MED ORDER — METRONIDAZOLE 500 MG PO TABS: 500.0000 mg | ORAL_TABLET | Freq: Three times a day (TID) | ORAL | 0 refills | Status: DC

## 2018-04-15 NOTE — Patient Instructions (Addendum)
I think you  present more like probable early diverticulitis.  I am giving prescription of Cipro and Flagyl.  Please start first doses of antibiotic tonight and take second dose tomorrow morning.  Our lab is closed presently so I cannot get CBC or metabolic panel.  In addition after hours I cannot get authorization for CT of abdomen and pelvis.  This may be beneficial but not absolutely indicated presently.  We are approaching the weekend so please give [place] an update by late tomorrow morning or early afternoon.  If your pain is a little worse than it is presently then we could get a CBC and metabolic panel stat and try to get authorization for CT abdomen and pelvis.  However if your pain as extreme/severe despite the above measures then recommend evaluation in the emergency department.  You had some slight pain on urination today and some faint left side CVA region pain.  UTI and kidney infection and differential diagnosis.  Follow-up in 5 days or as needed.  But again update [place] tomorrow if your symptoms worsen as explained above.

## 2018-04-15 NOTE — Progress Notes (Signed)
Subjective:    Patient ID: Lorraine Conrad, female    DOB: 1970/06/27, 47 y.o.   MRN: [id number]  HPI  Pt in with some recent abdomen pain 2 days ago. But then yesterday in morning she states pain on left lower side was moderate to severe. Points to left lower quadrant. Pt did have some loose stools yesterday x 2. She has some chills. Also has nausea.  Pt had hysterectom in the past and removed ovaries as well.   Mild chills but not reporting fever or sweats.    Review of Systems  Constitutional: Positive for chills. Negative for fatigue and fever.  Respiratory: Negative for cough, choking and wheezing.   Cardiovascular: Negative for chest pain and palpitations.       Brief second palpitation. 2 pm. No further. Negative ekg in past. Had stress test and ekg. Heart RRR sustained on long auscultation. Discussed with pt. Did not get ekg. But if reoccurs/sustaine or chest pain then ED. Pt understands.  Gastrointestinal: Positive for abdominal pain. Negative for abdominal distention, blood in stool, diarrhea, nausea and vomiting.  Genitourinary: Positive for dysuria. Negative for flank pain, frequency, pelvic pain and urgency.       Faint pain only today when she urinated.  Musculoskeletal: Positive for back pain.  Skin: Negative for rash.  Neurological: Negative for dizziness and headaches.  Hematological: Negative for adenopathy. Does not bruise/bleed easily.  Psychiatric/Behavioral: Negative for behavioral problems, confusion, sleep disturbance and suicidal ideas.    Past Medical History:  Diagnosis Date  . Allergy    SEASONAL  . Anemia   . Arthritis    "spine" (03/01/2014)  . Asthma   . Chronic lower back pain   . Endometriosis   . Erosive gastritis   . External hemorrhoids   . GERD (gastroesophageal reflux disease)   . H/O shortness of breath 2014   Cardiopulmonary exercise test results  . Headache    "probably monthly" (03/01/2014)  . Migraine 1998; 02/2014   "this one's  lasted 10 days straight" (03/01/2014)  . OSA on CPAP   . Ovarian cyst, left   . Proctalgia fugax   . Sleep apnea    on cpap  . Thyroid disease    thyroid nodule     Social History   Socioeconomic History  . Marital status: Married    Spouse name: Not on file  . Number of children: 2  . Years of education: Not on file  . Highest education level: Not on file  Occupational History  . Occupation: homemaker  Social Needs  . Financial resource strain: Not on file  . Food insecurity:    Worry: Not on file    Inability: Not on file  . Transportation needs:    Medical: Not on file    Non-medical: Not on file  Tobacco Use  . Smoking status: Never Smoker  . Smokeless tobacco: Never Used  . Tobacco comment: father smoked in house growing up per pt.   Substance and Sexual Activity  . Alcohol use: Yes    Alcohol/week: 0.0 standard drinks    Comment: 12-23-16 "I'll have 1-2 drinks maybe 3-4 times/yr"  . Drug use: No  . Sexual activity: Yes    Birth control/protection: Surgical  Lifestyle  . Physical activity:    Days per week: Not on file    Minutes per session: Not on file  . Stress: Not on file  Relationships  . Social connections:    Talks on  phone: Not on file    Gets together: Not on file    Attends religious service: Not on file    Active member of club or organization: Not on file    Attends meetings of clubs or organizations: Not on file    Relationship status: Not on file  . Intimate partner violence:    Fear of current or ex partner: Not on file    Emotionally abused: Not on file    Physically abused: Not on file    Forced sexual activity: Not on file  Other Topics Concern  . Not on file  Social History Narrative   Daily caffiene use: 6 cups per day   Patient does not get regular excercise    Past Surgical History:  Procedure Laterality Date  . ABDOMINAL HYSTERECTOMY  04/2013  . BREAST BIOPSY Bilateral 10/05/2000  . BREAST EXCISIONAL BIOPSY Left 1991  .  CESAREAN SECTION  2002; 2006  . COLONOSCOPY    . LAPAROSCOPIC OVARIAN CYSTECTOMY  ~ 1999  . NASAL SEPTUM SURGERY  ~ 1991  . OOPHORECTOMY Right 2015  . TEMPOROMANDIBULAR JOINT SURGERY  ~ 37  . UPPER GASTROINTESTINAL ENDOSCOPY      Family History  Problem Relation Age of Onset  . Prostate cancer Paternal Grandfather   . Colon polyps Paternal Grandfather   . Colon polyps Father   . Heart disease Father   . Diverticulitis Mother   . Colon polyps Paternal Grandmother   . Breast cancer Paternal Grandmother   . Colon cancer Neg Hx   . Esophageal cancer Neg Hx   . Rectal cancer Neg Hx   . Stomach cancer Neg Hx   . Pancreatic cancer Neg Hx     Allergies  Allergen Reactions  . Promethazine Hcl Other (See Comments)    Violent tremors   . Cephalexin Diarrhea and Itching  . Codeine Other (See Comments)    Was a child when she took this.  . Dilaudid [Hydromorphone] Nausea And Vomiting  . Soy Allergy Itching  . Azithromycin Rash    Head to toe rash    Current Outpatient Medications on File Prior to Visit  Medication Sig Dispense Refill  . azelastine (ASTELIN) 0.1 % nasal spray Place 2 sprays into both nostrils 2 (two) times daily. Use in each nostril as directed (Patient taking differently: Place 2 sprays into both nostrils as needed. Use in each nostril as directed) 30 mL 3  . beclomethasone (QVAR) 40 MCG/ACT inhaler Inhale 2 puffs into the lungs 2 (two) times daily at 10 AM and 5 PM. (Patient taking differently: Inhale 2 puffs into the lungs as needed. ) 1 Inhaler 3  . conjugated estrogens (PREMARIN) vaginal cream as needed    . Cyanocobalamin (VITAMIN B-12) 1000 MCG SUBL Place under the tongue as needed.     . cyclobenzaprine (FLEXERIL) 10 MG tablet Take 10 mg by mouth as needed.     . diclofenac (VOLTAREN) 75 MG EC tablet Take 1 tablet (75 mg total) by mouth 2 (two) times daily. (Patient taking differently: Take 75 mg by mouth as needed. ) 30 tablet 1  . doxycycline (VIBRA-TABS)  100 MG tablet Take 1 tablet (100 mg total) by mouth 2 (two) times daily. Can give capsules or generic 20 tablet 0  . fluticasone (FLONASE) 50 MCG/ACT nasal spray Place 2 sprays into both nostrils daily. 16 g 1  . guaiFENesin (MUCINEX) 600 MG 12 hr tablet Take 1,200 mg by mouth as needed.     Marland Kitchen  hydrOXYzine (ATARAX/VISTARIL) 10 MG tablet Take 1 tablet (10 mg total) by mouth 3 (three) times daily as needed for itching. 30 tablet 0  . lansoprazole (PREVACID) 30 MG capsule Take 30 mg by mouth as needed.     Marland Kitchen levocetirizine (XYZAL) 5 MG tablet Take 1 tablet (5 mg total) by mouth every evening. 30 tablet 3  . loratadine (CLARITIN) 10 MG tablet Take 1 tablet (10 mg total) by mouth daily. (Patient taking differently: Take 10 mg by mouth daily as needed. ) 30 tablet 0  . methocarbamol (ROBAXIN) 500 MG tablet Take 1 tablet (500 mg total) by mouth every 6 (six) hours as needed for muscle spasms. (Patient taking differently: Take 500 mg by mouth as needed for muscle spasms. ) 20 tablet 0  . montelukast (SINGULAIR) 10 MG tablet Take 1 tablet (10 mg total) by mouth at bedtime. (Patient taking differently: Take 10 mg by mouth at bedtime as needed. ) 30 tablet 2  . montelukast (SINGULAIR) 10 MG tablet Take 1 tablet (10 mg total) by mouth at bedtime. 30 tablet 3  . PROAIR HFA 108 (90 Base) MCG/ACT inhaler USE 2 PUFFS INTO LUNGS EVERY 6 HOURS AS NEEDED FOR WHEEZING OR SHORTNESS OF BREATH 8.5 Inhaler 3  . ranitidine (ZANTAC) 150 MG capsule Take 1 capsule (150 mg total) by mouth 2 (two) times daily. (Patient taking differently: Take 150 mg by mouth as needed. ) 60 capsule 0  . sulfamethoxazole-trimethoprim (BACTRIM DS) 800-160 MG tablet Take 1 tablet by mouth 2 (two) times daily. 14 tablet 0  . benzonatate (TESSALON) 100 MG capsule Take 1 capsule (100 mg total) by mouth 3 (three) times daily as needed for cough. (Patient not taking: Reported on 04/15/2018) 30 capsule 0  . hydroxychloroquine (PLAQUENIL) 200 MG tablet Take  400 mg by mouth.     No current facility-administered medications on file prior to visit.     BP 139/84 (BP Location: Right Arm, Patient Position: Sitting, Cuff Size: Normal)   Pulse 86   Temp 98.2 F (36.8 C) (Oral)   Resp 16   Ht 5\' 4"  (1.626 m)   Wt 220 lb 6 oz (100 kg)   LMP 02/08/2013   SpO2 98%   BMI 37.83 kg/m       Objective:   Physical Exam  General Appearance- Not in acute distress.  HEENT Eyes- Scleraeral/Conjuntiva-bilat- Not Yellow. Mouth & Throat- Normal.  Chest and Lung Exam Auscultation: Breath sounds:-Normal. Adventitious sounds:- No Adventitious sounds.  Cardiovascular Auscultation:Rythm - Regular. Heart Sounds -Normal heart sounds.  Abdomen Inspection:-Inspection Normal.  Palpation/Perucssion: Palpation and Percussion of the abdomen reveal- left lower quadrant  Tender, No Rebound tenderness, No rigidity(Guarding) and No Palpable abdominal masses.  Liver:-Normal.  Spleen:- Normal.   Back- faint left  cva tenderness.      Assessment & Plan:   think you  present more like probable early diverticulitis.  I am giving prescription of Cipro and Flagyl.  Please start first doses of antibiotic tonight and take second dose tomorrow morning.  Our lab is closed presently so I cannot get CBC or metabolic panel.  In addition after hours I cannot get authorization for CT of abdomen and pelvis.  This may be beneficial but not absolutely indicated presently.  We are approaching the weekend so please give [place] an update by late tomorrow morning or early afternoon.  If your pain is a little worse than it is presently then we could get a CBC and metabolic panel stat  and try to get authorization for CT abdomen and pelvis.  However if your pain as extreme/severe despite the above measures then recommend evaluation in the emergency department.  You had some slight pain on urination today and some faint left side CVA region pain.  UTI and kidney infection and differential  diagnosis.  Follow-up in 5 days or as needed.  But again update [place] tomorrow if your symptoms worsen as explained above.  Mackie Pai, PA-C

## 2018-04-16 ENCOUNTER — Telehealth: Payer: Self-pay

## 2018-04-16 NOTE — Telephone Encounter (Signed)
Patient calling to check and see if Percell Miller has advised on this yet. Please advise.

## 2018-04-16 NOTE — Addendum Note (Signed)
Addended by: Hinton Dyer on: 04/16/2018 11:49 AM   Modules accepted: Orders

## 2018-04-16 NOTE — Telephone Encounter (Signed)
Copied from [place] [id number]. Topic: General - Other >> Apr 16, 2018 11:26 AM Yvette Rack wrote: Reason for CRM: pt calling to let Mackie Pai know how she is feeling she woke up feeling better but still nauseous she states that once she got in the car and started to drive she still had abd pain and felt really nauseous

## 2018-04-17 LAB — URINE CULTURE
MICRO NUMBER:: 91348106
Result:: NO GROWTH
SPECIMEN QUALITY: ADEQUATE

## 2018-04-18 ENCOUNTER — Telehealth: Payer: Self-pay | Admitting: Medical

## 2018-04-18 NOTE — Telephone Encounter (Signed)
Will you call patient and see how she is doing. Saw she gave mixed report the other day. Some better in morning but some abd pain and nausea driving?

## 2018-04-19 NOTE — Telephone Encounter (Signed)
Leg and breathing complaint would be new. So advise her if this is persisting or worsening then ED evaluation. As first thought regarding that is DVT and possible PE. So please advise and document she was advised and cautione.

## 2018-04-19 NOTE — Telephone Encounter (Signed)
Tried to reach pt. Pt vm not sent up. Okay for PEC to give information.

## 2018-04-19 NOTE — Telephone Encounter (Signed)
Pt states yesterday her leg was hurting behind her knee. She states her breathing was "off" yesterday, and she states she is still having diarrhea and nausea yesterday and today. She states she feels some better than she did yesterday. She has not been able to eat a whole lot. Offered to move pts appointment to earlier than Wednesday, but pt is not able to. CB#: [phone]

## 2018-04-20 ENCOUNTER — Encounter (HOSPITAL_COMMUNITY): Payer: Self-pay | Admitting: Internal Medicine

## 2018-04-20 ENCOUNTER — Ambulatory Visit: Payer: BLUE CROSS/BLUE SHIELD | Admitting: Medical

## 2018-04-20 ENCOUNTER — Emergency Department (HOSPITAL_COMMUNITY)
Admission: EM | Admit: 2018-04-20 | Discharge: 2018-04-20 | Disposition: A | Discharge status: Home / Self Care | Payer: BLUE CROSS/BLUE SHIELD | Attending: Emergency Medicine | Admitting: Emergency Medicine

## 2018-04-20 ENCOUNTER — Emergency Department (HOSPITAL_BASED_OUTPATIENT_CLINIC_OR_DEPARTMENT_OTHER): Payer: BLUE CROSS/BLUE SHIELD

## 2018-04-20 DIAGNOSIS — M7989 Other specified soft tissue disorders: Secondary | ICD-10-CM | POA: Diagnosis not present

## 2018-04-20 DIAGNOSIS — M79605 Pain in left leg: Secondary | ICD-10-CM | POA: Diagnosis not present

## 2018-04-20 DIAGNOSIS — R52 Pain, unspecified: Secondary | ICD-10-CM | POA: Diagnosis not present

## 2018-04-20 DIAGNOSIS — J45909 Unspecified asthma, uncomplicated: Secondary | ICD-10-CM | POA: Insufficient documentation

## 2018-04-20 DIAGNOSIS — E079 Disorder of thyroid, unspecified: Secondary | ICD-10-CM | POA: Diagnosis not present

## 2018-04-20 NOTE — ED Notes (Signed)
ED Provider at bedside. 

## 2018-04-20 NOTE — Telephone Encounter (Signed)
Pt d/c'd from hospital, negative for DVT. Pt. Has appointment with Percell Miller tomorrow 11/13.

## 2018-04-20 NOTE — ED Notes (Signed)
Patient ambulatory to bathroom with steady gait at this time 

## 2018-04-20 NOTE — Telephone Encounter (Signed)
Notified pt of below. She declines evaluation in the ER. Risks discussed with patient and she voices understanding. States her leg pain has resolved. States she goes through cycles of pain that seem to jump from place to place on her body and thinks this is just another episode of that type of pain. Pt has an appointment for tomorrow and states she will keep that appt.

## 2018-04-20 NOTE — ED Notes (Signed)
Patient verbalizes understanding of discharge instructions. Opportunity for questioning and answers were provided. Armband removed by staff, pt discharged from ED.  

## 2018-04-20 NOTE — ED Triage Notes (Addendum)
Pt c/o pain in left calf yesterday and reports difficulty walking with shortness of breath x3 days. PCP called patient this morning and told her to go to the ED to be evaluated for possible blood clot. Patient elevated left lower extremity last night and repositioning provided pain relief. Reports 11 hour car ride one way a couple of weeks ago. No obvious redness or swelling noted to LLE.

## 2018-04-20 NOTE — Telephone Encounter (Signed)
Pt called back and states that she is going to go to the emergency room. Her number is [phone], she can be reached there today.

## 2018-04-20 NOTE — ED Provider Notes (Signed)
[facility] EMERGENCY DEPARTMENT Provider Note   CSN: [id number] Arrival date & time: 04/20/18  3710     History   Chief Complaint Chief Complaint  Patient presents with  . Leg Pain    HPI Lorraine Conrad is a 47 y.o. female.  Patient c/o left posterior lower leg pain in past couple days. Pain dull, mild, constant, non radiating. Leg felt mildly swollen to patient. Notes recent long car ride. Denies trauma or strain to area. No skin changes or redness. No numbness/weakness. No chest pain or sob. States pcp told pt to go to ED for dvt study. No ocp use. Non smoker. No hx dvt or pe.   The history is provided by the patient.  Leg Pain   Pertinent negatives include no numbness.    Past Medical History:  Diagnosis Date  . Allergy    SEASONAL  . Anemia   . Arthritis    "spine" (03/01/2014)  . Asthma   . Chronic lower back pain   . Endometriosis   . Erosive gastritis   . External hemorrhoids   . GERD (gastroesophageal reflux disease)   . H/O shortness of breath 2014   Cardiopulmonary exercise test results  . Headache    "probably monthly" (03/01/2014)  . Migraine 1998; 02/2014   "this one's lasted 10 days straight" (03/01/2014)  . OSA on CPAP   . Ovarian cyst, left   . Proctalgia fugax   . Sleep apnea    on cpap  . Thyroid disease    thyroid nodule    Patient Active Problem List   Diagnosis Date Noted  . Dysphasia 10/12/2017  . Globus pharyngeus 10/12/2017  . Paresthesias 04/14/2017  . Cervical radiculopathy 12/29/2016  . Lumbar radiculopathy 12/29/2016  . Diastolic dysfunction 62/69/4854  . Mass of ovary 07/12/2015  . History of laparoscopy 06/19/2015  . Left hip pain 05/24/2015  . Left knee pain 05/24/2015  . Left ankle pain 05/24/2015  . Chest wall pain 05/16/2015  . OSA (obstructive sleep apnea) 03/01/2015  . Leukocytosis 03/01/2015  . Leg weakness, bilateral 03/01/2015  . B12 deficiency 03/01/2015  . Allergic rhinitis 12/12/2014  .  Dyspnea 12/12/2014  . SOB (shortness of breath) 10/30/2014  . Migraine 07/10/2014  . Hemorrhoid 07/10/2014  . H/O: hysterectomy 07/05/2014  . Arthralgia 05/08/2014  . Tachycardia 03/29/2014  . Tinnitus 03/27/2014  . Fatigue 03/27/2014  . Back pain 03/27/2014  . Shingles 03/09/2014  . Fever 03/09/2014  . Hot flashes 03/09/2014  . Meningitis, viral 03/02/2014  . Headache 03/01/2014  . Myalgia and myositis 02/23/2014  . Headache(784.0) 02/23/2014  . Endometriosis 02/04/2014  . Routine general medical examination at a health care facility 01/16/2014  . Psoriasis 01/16/2014  . Obesity (BMI 35.0-39.9 without comorbidity) 01/16/2014  . Neck pain 01/01/2014  . IC (interstitial cystitis) 11/14/2013  . Dysuria 10/11/2013  . Abnormal CBC 10/11/2013  . Watery eyes 10/11/2013  . Plantar fasciitis of left foot 03/18/2013  . IBS (irritable bowel syndrome) 03/18/2013  . Nausea alone 02/17/2013  . PLEURISY 11/10/2008  . GASTRITIS 08/10/2008  . ANXIETY 01/06/2008  . PROCTALGIA FUGAX 01/06/2008  . ABDOMINAL PAIN-MULTIPLE SITES 01/06/2008  . GERD 11/15/2007  . CONSTIPATION 11/15/2007  . Chest pain 11/15/2007  . OVARIAN CYST, LEFT 11/11/2007    Past Surgical History:  Procedure Laterality Date  . ABDOMINAL HYSTERECTOMY  04/2013  . BREAST BIOPSY Bilateral 10/05/2000  . BREAST EXCISIONAL BIOPSY Left 1991  . CESAREAN SECTION  2002; 2006  .  COLONOSCOPY    . LAPAROSCOPIC OVARIAN CYSTECTOMY  ~ 1999  . NASAL SEPTUM SURGERY  ~ 1991  . OOPHORECTOMY Right 2015  . TEMPOROMANDIBULAR JOINT SURGERY  ~ 28  . UPPER GASTROINTESTINAL ENDOSCOPY       OB History   None      Home Medications    Prior to Admission medications   Medication Sig Start Date End Date Taking? Authorizing Provider  azelastine (ASTELIN) 0.1 % nasal spray Place 2 sprays into both nostrils 2 (two) times daily. Use in each nostril as directed Patient taking differently: Place 2 sprays into both nostrils as needed. Use  in each nostril as directed 03/12/16   Saguier, Percell Miller, PA-C  beclomethasone (QVAR) 40 MCG/ACT inhaler Inhale 2 puffs into the lungs 2 (two) times daily at 10 AM and 5 PM. Patient taking differently: Inhale 2 puffs into the lungs as needed.  05/23/15   Saguier, Percell Miller, PA-C  benzonatate (TESSALON) 100 MG capsule Take 1 capsule (100 mg total) by mouth 3 (three) times daily as needed for cough. Patient not taking: Reported on 04/15/2018 12/11/17   Saguier, Percell Miller, PA-C  ciprofloxacin (CIPRO) 500 MG tablet Take 1 tablet (500 mg total) by mouth 2 (two) times daily. 04/15/18   Saguier, Percell Miller, PA-C  conjugated estrogens (PREMARIN) vaginal cream as needed 01/28/16   [provider]  Cyanocobalamin (VITAMIN B-12) 1000 MCG SUBL Place under the tongue as needed.  07/11/16   [provider]  cyclobenzaprine (FLEXERIL) 10 MG tablet Take 10 mg by mouth as needed.  03/09/15   [provider]  diclofenac (VOLTAREN) 75 MG EC tablet Take 1 tablet (75 mg total) by mouth 2 (two) times daily. Patient taking differently: Take 75 mg by mouth as needed.  09/03/16   Saguier, Percell Miller, PA-C  doxycycline (VIBRA-TABS) 100 MG tablet Take 1 tablet (100 mg total) by mouth 2 (two) times daily. Can give capsules or generic 11/19/17   Saguier, Percell Miller, PA-C  fluticasone [facility]) 50 MCG/ACT nasal spray Place 2 sprays into both nostrils daily. 10/06/17   Saguier, Percell Miller, PA-C  guaiFENesin (MUCINEX) 600 MG 12 hr tablet Take 1,200 mg by mouth as needed.     [provider]  hydroxychloroquine (PLAQUENIL) 200 MG tablet Take 400 mg by mouth. 12/16/16   [provider]  hydrOXYzine (ATARAX/VISTARIL) 10 MG tablet Take 1 tablet (10 mg total) by mouth 3 (three) times daily as needed for itching. 03/12/16   Saguier, Percell Miller, PA-C  lansoprazole (PREVACID) 30 MG capsule Take 30 mg by mouth as needed.     [provider]  levocetirizine (XYZAL) 5 MG tablet Take 1 tablet (5 mg total) by mouth every evening.  12/11/17   Saguier, Percell Miller, PA-C  loratadine (CLARITIN) 10 MG tablet Take 1 tablet (10 mg total) by mouth daily. Patient taking differently: Take 10 mg by mouth daily as needed.  04/24/16   Saguier, Percell Miller, PA-C  methocarbamol (ROBAXIN) 500 MG tablet Take 1 tablet (500 mg total) by mouth every 6 (six) hours as needed for muscle spasms. Patient taking differently: Take 500 mg by mouth as needed for muscle spasms.  09/17/16   Saguier, Percell Miller, PA-C  metroNIDAZOLE (FLAGYL) 500 MG tablet Take 1 tablet (500 mg total) by mouth 3 (three) times daily. 04/15/18   Saguier, Percell Miller, PA-C  montelukast (SINGULAIR) 10 MG tablet Take 1 tablet (10 mg total) by mouth at bedtime. Patient taking differently: Take 10 mg by mouth at bedtime as needed.  05/09/16   Inda Castle,  Melissa, NP  montelukast (SINGULAIR) 10 MG tablet Take 1 tablet (10 mg total) by mouth at bedtime. 12/11/17   Saguier, Percell Miller, PA-C  ondansetron (ZOFRAN ODT) 8 MG disintegrating tablet Take 1 tablet (8 mg total) by mouth every 8 (eight) hours as needed for nausea or vomiting. 04/15/18   Saguier, Percell Miller, PA-C  PROAIR HFA 108 (90 Base) MCG/ACT inhaler USE 2 PUFFS INTO LUNGS EVERY 6 HOURS AS NEEDED FOR WHEEZING OR SHORTNESS OF BREATH 11/25/17   Saguier, Percell Miller, PA-C  ranitidine (ZANTAC) 150 MG capsule Take 1 capsule (150 mg total) by mouth 2 (two) times daily. Patient taking differently: Take 150 mg by mouth as needed.  07/10/16   Saguier, Percell Miller, PA-C  sulfamethoxazole-trimethoprim (BACTRIM DS) 800-160 MG tablet Take 1 tablet by mouth 2 (two) times daily. 12/11/17   Saguier, Percell Miller, PA-C    Family History Family History  Problem Relation Age of Onset  . Prostate cancer Paternal Grandfather   . Colon polyps Paternal Grandfather   . Colon polyps Father   . Heart disease Father   . Diverticulitis Mother   . Colon polyps Paternal Grandmother   . Breast cancer Paternal Grandmother   . Colon cancer Neg Hx   . Esophageal cancer Neg Hx   . Rectal cancer Neg Hx     . Stomach cancer Neg Hx   . Pancreatic cancer Neg Hx     Social History Social History   Tobacco Use  . Smoking status: Never Smoker  . Smokeless tobacco: Never Used  . Tobacco comment: father smoked in house growing up per pt.   Substance Use Topics  . Alcohol use: Yes    Alcohol/week: 0.0 standard drinks    Comment: 12-23-16 "I'll have 1-2 drinks maybe 3-4 times/yr"  . Drug use: No     Allergies   Promethazine hcl; Cephalexin; Codeine; Dilaudid [hydromorphone]; Soy allergy; and Azithromycin   Review of Systems Review of Systems  Constitutional: Negative for fever.  Respiratory: Negative for shortness of breath.   Cardiovascular: Negative for chest pain.  Skin: Negative for rash.  Neurological: Negative for numbness.     Physical Exam Updated Vital Signs Ht 1.626 m (5\' 4" )   Wt 99.8 kg   LMP 02/08/2013   BMI 37.76 kg/m   Physical Exam  Constitutional: She appears well-developed and well-nourished.  HENT:  Head: Atraumatic.  Eyes: Conjunctivae are normal. No scleral icterus.  Neck: Neck supple. No tracheal deviation present.  Cardiovascular: Normal rate and intact distal pulses.  Pulmonary/Chest: Effort normal. No respiratory distress.  Abdominal: Normal appearance.  Musculoskeletal: She exhibits no edema.  No LLE leg edema or focal sts or tenderness noted. Distal pulses palp. No pain w rom at knee or ankle.   Neurological: She is alert.  Skin: Skin is warm and dry. No rash noted.  Psychiatric: She has a normal mood and affect.  Nursing note and vitals reviewed.    ED Treatments / Results  Labs (all labs ordered are listed, but only abnormal results are displayed) Labs Reviewed - No data to display  EKG None  Radiology No results found.  Procedures Procedures (including critical care time)  Medications Ordered in ED Medications - No data to display   Initial Impression / Assessment and Plan / ED Course  I have reviewed the triage vital  signs and the nursing notes.  Pertinent labs & imaging results that were available during my care of the patient were reviewed by me and considered in my medical  decision making (see chart for details).  Vascular study ordered.   Reviewed nursing notes and prior charts for additional history.   Vascular study reviewed - see report below - neg for dvt.    Landry Mellow, [place]  Cardiovascular Sonographer  Vascular Lab  Progress Notes  Signed  Date of Service:  04/20/2018 10:30 AM          Signed         Show:Clear all [x] Manual[x] Template[] Copied  Added by: [x] Landry Mellow, [place]  [] Hover for details Preliminary notes--Left lower extremity venous duplex exam completed. Negative for DVT.  Selina H Cole(RDMS RVT) 04/20/18' 10:31 AM             Pt appears stable for d/c.     Final Clinical Impressions(s) / ED Diagnoses   Final diagnoses:  None    ED Discharge Orders    None       Lajean Saver, MD 04/20/18 1045

## 2018-04-20 NOTE — Progress Notes (Signed)
Preliminary notes--Left lower extremity venous duplex exam completed. Negative for DVT.  Lashun Mccants H Earlean Fidalgo(RDMS RVT) 04/20/18' 10:31 AM

## 2018-04-20 NOTE — Discharge Instructions (Signed)
It was our pleasure to provide your ER care today - we hope that you feel better.  Your vascular ultrasound study looks good, it is negative for dvt.   You may try ibuprofen or naprosyn as need for pain.  Return to ER if worse, new symptoms, severe leg pain, severe swelling, other concern.

## 2018-04-21 ENCOUNTER — Ambulatory Visit: Payer: BLUE CROSS/BLUE SHIELD | Admitting: Medical

## 2018-04-22 ENCOUNTER — Telehealth: Payer: Self-pay | Admitting: Medical

## 2018-04-22 ENCOUNTER — Encounter: Payer: Self-pay | Admitting: Medical

## 2018-04-22 ENCOUNTER — Ambulatory Visit (INDEPENDENT_AMBULATORY_CARE_PROVIDER_SITE_OTHER): Payer: BLUE CROSS/BLUE SHIELD | Admitting: Medical

## 2018-04-22 VITALS — BP 140/86 | HR 84 | Temp 98.0°F | Resp 16 | Ht 64.0 in | Wt 219.8 lb

## 2018-04-22 DIAGNOSIS — R700 Elevated erythrocyte sedimentation rate: Secondary | ICD-10-CM | POA: Diagnosis not present

## 2018-04-22 DIAGNOSIS — R32 Unspecified urinary incontinence: Secondary | ICD-10-CM

## 2018-04-22 DIAGNOSIS — B370 Candidal stomatitis: Secondary | ICD-10-CM

## 2018-04-22 DIAGNOSIS — M79605 Pain in left leg: Secondary | ICD-10-CM | POA: Diagnosis not present

## 2018-04-22 DIAGNOSIS — R5383 Other fatigue: Secondary | ICD-10-CM

## 2018-04-22 DIAGNOSIS — K146 Glossodynia: Secondary | ICD-10-CM | POA: Diagnosis not present

## 2018-04-22 DIAGNOSIS — K5792 Diverticulitis of intestine, part unspecified, without perforation or abscess without bleeding: Secondary | ICD-10-CM

## 2018-04-22 DIAGNOSIS — H469 Unspecified optic neuritis: Secondary | ICD-10-CM

## 2018-04-22 DIAGNOSIS — H5711 Ocular pain, right eye: Secondary | ICD-10-CM

## 2018-04-22 LAB — COMPREHENSIVE METABOLIC PANEL
ALT: 23 U/L (ref 0–35)
AST: 24 U/L (ref 0–37)
Albumin: 4.3 g/dL (ref 3.5–5.2)
Alkaline Phosphatase: 84 U/L (ref 39–117)
BILIRUBIN TOTAL: 0.4 mg/dL (ref 0.2–1.2)
BUN: 12 mg/dL (ref 6–23)
CALCIUM: 9.4 mg/dL (ref 8.4–10.5)
CHLORIDE: 104 meq/L (ref 96–112)
CO2: 26 meq/L (ref 19–32)
Creatinine, Ser: 0.99 mg/dL (ref 0.40–1.20)
GFR: 64.25 mL/min (ref >60.00)
Glucose, Bld: 92 mg/dL (ref 70–99)
POTASSIUM: 4.3 meq/L (ref 3.5–5.1)
Sodium: 138 mEq/L (ref 135–145)
Total Protein: 7.5 g/dL (ref 6.0–8.3)

## 2018-04-22 LAB — CBC WITH DIFFERENTIAL/PLATELET
BASOS ABS: 0 10*3/uL (ref 0.0–0.1)
BASOS PCT: 0.5 % (ref 0.0–3.0)
Eosinophils Absolute: 0.2 10*3/uL (ref 0.0–0.7)
Eosinophils Relative: 2 % (ref 0.0–5.0)
HEMATOCRIT: 42.7 % (ref 36.0–46.0)
HEMOGLOBIN: 14.1 g/dL (ref 12.0–15.0)
LYMPHS PCT: 21 % (ref 12.0–46.0)
Lymphs Abs: 2 10*3/uL (ref 0.7–4.0)
MCHC: 33.2 g/dL (ref 30.0–36.0)
MCV: 84 fl (ref 78.0–100.0)
Monocytes Absolute: 0.6 10*3/uL (ref 0.1–1.0)
Monocytes Relative: 5.8 % (ref 3.0–12.0)
Neutro Abs: 6.7 10*3/uL (ref 1.4–7.7)
Neutrophils Relative %: 70.7 % (ref 43.0–77.0)
PLATELETS: 423 10*3/uL — AB (ref 150.0–400.0)
RBC: 5.08 Mil/uL (ref 3.87–5.11)
RDW: 14.3 % (ref 11.5–15.5)
WBC: 9.5 10*3/uL (ref 4.0–10.5)

## 2018-04-22 LAB — SEDIMENTATION RATE: Sed Rate: 23 mm/hr — ABNORMAL HIGH (ref 0–20)

## 2018-04-22 LAB — FOLATE: FOLATE: 11.5 ng/mL (ref >5.9)

## 2018-04-22 LAB — VITAMIN B12: Vitamin B-12: 321 pg/mL (ref 211–911)

## 2018-04-22 MED ORDER — PREDNISONE 10 MG PO TABS: ORAL_TABLET | ORAL | 0 refills | Status: DC

## 2018-04-22 MED ORDER — FLUCONAZOLE 150 MG PO TABS: 150.0000 mg | ORAL_TABLET | Freq: Once | ORAL | 0 refills | Status: AC

## 2018-04-22 MED ORDER — OXYBUTYNIN CHLORIDE ER 5 MG PO TB24: 5.0000 mg | ORAL_TABLET | Freq: Every day | ORAL | 0 refills | Status: DC

## 2018-04-22 NOTE — Telephone Encounter (Signed)
Would you refer to GI MD Dr. Fuller Plan. Not gi upstairs. Sorry.

## 2018-04-22 NOTE — Patient Instructions (Signed)
For recent sharp transient eye pain, gradual worsening urinary incontinence and transient episode of blurred weakness, I did go ahead and make a referral to a neurologist.  By history of memory you has a neurologist and I believe MS was ruled out.  However will refer again in order to get second opinion.  You might need retesting.  During the interim I want you to get records from your optometrist so we can review the negative work-up.  Also going ahead and prescribing Ditropan to see if this helps with the your described incontinence.  You have recurrent symptoms pending neurology referral please let me know.  If severe then recommend ED evaluation.  Pain in the left lower extremity has resolved.  Negative exam.  Ultrasound was negative for DVT.  If pain reoccurs please let me know.  Might get opinion of sports medicine.  You describe some burning tongue type symptoms intermittently.  On exam it appears that she might have some mild thrush post antibiotic use.  Prescribe Diflucan and I will get a B12, B1 and folate for work-up of burning tongue.  If symptoms worsen consider Magic mouthwash.  For history of elevated sed rate and recent eye pain, did add sed rate to the labs today.  If sed rate elevated then consider tapered prednisone course.  Your abdomen pain has resolved except some faint tenderness found on exam.  Did seem to respond to Cipro and Flagyl.  Based on your GI history we will go ahead and refer you back to your GI MD.  If your pain worsens again pending that GI appointment then let me know and I would get CT abdomen and pelvis.  If severe recurrent acute pain after hours then recommend ED evaluation.  Follow-up in 2 to 3 weeks or as needed.

## 2018-04-22 NOTE — Telephone Encounter (Signed)
rx prednisone sent to pt pharmacy.

## 2018-04-22 NOTE — Progress Notes (Signed)
Subjective:    Patient ID: Lorraine Conrad, female    DOB: 03-Jan-1971, 47 y.o.   MRN: [id number]  HPI  Pt in states she has had some recent pain in her left leg recently. She was having calf pain and some pain behind her knee. Pain started on Sunday. She went to ED. Her [place] of lower ext was negative. Pt states when leg was hurting she had no weakness. When she walked she would fell pressure.    Pt also reports she had random sharp pain to rt eye just yesterday. Pt states sharp intense pain for 2 minutes. Almost made her cry. Then pain went away gradually. Resolved in 2 hours. Pt states 2 weeks ago random flashes of light rt eye. She did see optometrist. No finding reported. No report of detached retina. No recent rt temporal pain. Describes hx of urge incontinence. But gradually having some inability to control bladder. Has to wear pads. Pt does mention random even on Monday when both legs felt weak less than 3-4 minutes.  Pt had some left side abd pain. Much better. Seemed to get better with cipro and flagyl. No mild loose stools post antibiotic. Urine culture was negative.   Review of Systems  Constitutional: Positive for fatigue. Negative for chills and fever.  HENT: Negative for congestion and dental problem.        Occasional episodes of burning tongue and buccal mucosal lesions in summer(then last week felt faint recurrence). Pt had mentioned to dentist in past and evaluated. Given magic mouthwash and she felt better.  Respiratory: Negative for cough, chest tightness, shortness of breath and wheezing.   Cardiovascular: Negative for chest pain and palpitations.  Gastrointestinal: Negative for abdominal pain.  Genitourinary: Negative for decreased urine volume and vaginal discharge.  Musculoskeletal: Negative for back pain.       Left lower extremity pain event resolved.  Skin: Negative for rash.  Neurological: Negative for dizziness, syncope, speech difficulty, weakness,  light-headedness, numbness and headaches.  Hematological: Negative for adenopathy. Does not bruise/bleed easily.  Psychiatric/Behavioral: Negative for behavioral problems, confusion and decreased concentration. The patient is not nervous/anxious.     Past Medical History:  Diagnosis Date  . Allergy    SEASONAL  . Anemia   . Arthritis    "spine" (03/01/2014)  . Asthma   . Chronic lower back pain   . Endometriosis   . Erosive gastritis   . External hemorrhoids   . GERD (gastroesophageal reflux disease)   . H/O shortness of breath 2014   Cardiopulmonary exercise test results  . Headache    "probably monthly" (03/01/2014)  . Migraine 1998; 02/2014   "this one's lasted 10 days straight" (03/01/2014)  . OSA on CPAP   . Ovarian cyst, left   . Proctalgia fugax   . Sleep apnea    on cpap  . Thyroid disease    thyroid nodule     Social History   Socioeconomic History  . Marital status: Married    Spouse name: Not on file  . Number of children: 2  . Years of education: Not on file  . Highest education level: Not on file  Occupational History  . Occupation: homemaker  Social Needs  . Financial resource strain: Not on file  . Food insecurity:    Worry: Not on file    Inability: Not on file  . Transportation needs:    Medical: Not on file    Non-medical: Not on file  Tobacco Use  . Smoking status: Never Smoker  . Smokeless tobacco: Never Used  . Tobacco comment: father smoked in house growing up per pt.   Substance and Sexual Activity  . Alcohol use: Yes    Alcohol/week: 0.0 standard drinks    Comment: 12-23-16 "I'll have 1-2 drinks maybe 3-4 times/yr"  . Drug use: No  . Sexual activity: Yes    Birth control/protection: Surgical  Lifestyle  . Physical activity:    Days per week: Not on file    Minutes per session: Not on file  . Stress: Not on file  Relationships  . Social connections:    Talks on phone: Not on file    Gets together: Not on file    Attends  religious service: Not on file    Active member of club or organization: Not on file    Attends meetings of clubs or organizations: Not on file    Relationship status: Not on file  . Intimate partner violence:    Fear of current or ex partner: Not on file    Emotionally abused: Not on file    Physically abused: Not on file    Forced sexual activity: Not on file  Other Topics Concern  . Not on file  Social History Narrative   Daily caffiene use: 6 cups per day   Patient does not get regular excercise    Past Surgical History:  Procedure Laterality Date  . ABDOMINAL HYSTERECTOMY  04/2013  . BREAST BIOPSY Bilateral 10/05/2000  . BREAST EXCISIONAL BIOPSY Left 1991  . CESAREAN SECTION  2002; 2006  . COLONOSCOPY    . LAPAROSCOPIC OVARIAN CYSTECTOMY  ~ 1999  . NASAL SEPTUM SURGERY  ~ 1991  . OOPHORECTOMY Right 2015  . TEMPOROMANDIBULAR JOINT SURGERY  ~ 62  . UPPER GASTROINTESTINAL ENDOSCOPY      Family History  Problem Relation Age of Onset  . Prostate cancer Paternal Grandfather   . Colon polyps Paternal Grandfather   . Colon polyps Father   . Heart disease Father   . Diverticulitis Mother   . Colon polyps Paternal Grandmother   . Breast cancer Paternal Grandmother   . Colon cancer Neg Hx   . Esophageal cancer Neg Hx   . Rectal cancer Neg Hx   . Stomach cancer Neg Hx   . Pancreatic cancer Neg Hx     Allergies  Allergen Reactions  . Promethazine Hcl Other (See Comments)    Violent tremors   . Cephalexin Diarrhea and Itching  . Codeine Other (See Comments)    Was a child when she took this.  . Dilaudid [Hydromorphone] Nausea And Vomiting  . Soy Allergy Itching  . Azithromycin Rash    Head to toe rash    Current Outpatient Medications on File Prior to Visit  Medication Sig Dispense Refill  . azelastine (ASTELIN) 0.1 % nasal spray Place 2 sprays into both nostrils 2 (two) times daily. Use in each nostril as directed (Patient taking differently: Place 2 sprays  into both nostrils as needed. Use in each nostril as directed) 30 mL 3  . beclomethasone (QVAR) 40 MCG/ACT inhaler Inhale 2 puffs into the lungs 2 (two) times daily at 10 AM and 5 PM. (Patient taking differently: Inhale 2 puffs into the lungs as needed. ) 1 Inhaler 3  . benzonatate (TESSALON) 100 MG capsule Take 1 capsule (100 mg total) by mouth 3 (three) times daily as needed for cough. 30 capsule 0  . ciprofloxacin (  CIPRO) 500 MG tablet Take 1 tablet (500 mg total) by mouth 2 (two) times daily. 14 tablet 0  . conjugated estrogens (PREMARIN) vaginal cream as needed    . Cyanocobalamin (VITAMIN B-12) 1000 MCG SUBL Place under the tongue as needed.     . cyclobenzaprine (FLEXERIL) 10 MG tablet Take 10 mg by mouth as needed.     . diclofenac (VOLTAREN) 75 MG EC tablet Take 1 tablet (75 mg total) by mouth 2 (two) times daily. (Patient taking differently: Take 75 mg by mouth as needed. ) 30 tablet 1  . doxycycline (VIBRA-TABS) 100 MG tablet Take 1 tablet (100 mg total) by mouth 2 (two) times daily. Can give capsules or generic 20 tablet 0  . fluticasone (FLONASE) 50 MCG/ACT nasal spray Place 2 sprays into both nostrils daily. 16 g 1  . guaiFENesin (MUCINEX) 600 MG 12 hr tablet Take 1,200 mg by mouth as needed.     . hydrOXYzine (ATARAX/VISTARIL) 10 MG tablet Take 1 tablet (10 mg total) by mouth 3 (three) times daily as needed for itching. 30 tablet 0  . lansoprazole (PREVACID) 30 MG capsule Take 30 mg by mouth as needed.     Marland Kitchen levocetirizine (XYZAL) 5 MG tablet Take 1 tablet (5 mg total) by mouth every evening. 30 tablet 3  . loratadine (CLARITIN) 10 MG tablet Take 1 tablet (10 mg total) by mouth daily. (Patient taking differently: Take 10 mg by mouth daily as needed. ) 30 tablet 0  . methocarbamol (ROBAXIN) 500 MG tablet Take 1 tablet (500 mg total) by mouth every 6 (six) hours as needed for muscle spasms. (Patient taking differently: Take 500 mg by mouth as needed for muscle spasms. ) 20 tablet 0  .  metroNIDAZOLE (FLAGYL) 500 MG tablet Take 1 tablet (500 mg total) by mouth 3 (three) times daily. 21 tablet 0  . montelukast (SINGULAIR) 10 MG tablet Take 1 tablet (10 mg total) by mouth at bedtime. (Patient taking differently: Take 10 mg by mouth at bedtime as needed. ) 30 tablet 2  . montelukast (SINGULAIR) 10 MG tablet Take 1 tablet (10 mg total) by mouth at bedtime. 30 tablet 3  . ondansetron (ZOFRAN ODT) 8 MG disintegrating tablet Take 1 tablet (8 mg total) by mouth every 8 (eight) hours as needed for nausea or vomiting. 21 tablet 0  . PROAIR HFA 108 (90 Base) MCG/ACT inhaler USE 2 PUFFS INTO LUNGS EVERY 6 HOURS AS NEEDED FOR WHEEZING OR SHORTNESS OF BREATH 8.5 Inhaler 3  . ranitidine (ZANTAC) 150 MG capsule Take 1 capsule (150 mg total) by mouth 2 (two) times daily. (Patient taking differently: Take 150 mg by mouth as needed. ) 60 capsule 0  . sulfamethoxazole-trimethoprim (BACTRIM DS) 800-160 MG tablet Take 1 tablet by mouth 2 (two) times daily. 14 tablet 0   No current facility-administered medications on file prior to visit.     BP 140/86   Pulse 84   Temp 98 F (36.7 C) (Oral)   Resp 16   Ht 5\' 4"  (1.626 m)   Wt 219 lb 12.8 oz (99.7 kg)   LMP 02/08/2013   SpO2 99%   BMI 37.73 kg/m       Objective:   Physical Exam  General Mental Status- Alert. General Appearance- Not in acute distress.   Eyes- Perrl, eom intact.  Skin General: Color- Normal Color. Moisture- Normal Moisture.  Mouth- mild beefy red appearance to tongue.  Neck Carotid Arteries- Normal color. Moisture- Normal Moisture.  No carotid bruits. No JVD.  Chest and Lung Exam Auscultation: Breath Sounds:-Normal.  Cardiovascular Auscultation:Rythm- Regular. Murmurs & Other Heart Sounds:Auscultation of the heart reveals- No Murmurs.  Abdomen Inspection:-Inspeection Normal. Palpation/Percussion:Note:No mass. Palpation and Percussion of the abdomen reveal- Non Tender, Non Distended + BS, no rebound or  guarding.    Neurologic Cranial Nerve exam:- CN III-XII intact(No nystagmus), symmetric smile. Drift Test:- No drift. Finger to Nose:- Normal/Intact Strength:- 5/5 equal and symmetric strength both upper and lower extremities.   Rt lower ext- negative homan sign. Calves symmetric. No pain.    Assessment & Plan:   For recent sharp transient eye pain, gradual worsening urinary incontinence and transient episode of blurred weakness, I did go ahead and make a referral to a neurologist.  By history of memory you has a neurologist and I believe MS was ruled out.  However will refer again in order to get second opinion.  You might need retesting.  During the interim I want you to get records from your optometrist so we can review the negative work-up.  Also going ahead and prescribing Ditropan to see if this helps with the your described incontinence.  You have recurrent symptoms pending neurology referral please let me know.  If severe then recommend ED evaluation.   Pain in the left lower extremity has resolved.  Negative exam.  Ultrasound was negative for DVT.  If pain reoccurs please let me know.  Might get opinion of sports medicine.  You describe some burning tongue type symptoms intermittently.  On exam it appears that she might have some mild thrush post antibiotic use.  Prescribe Diflucan and I will get a B12, B1 and folate for work-up of burning tongue.  If symptoms worsen consider Magic mouthwash.  For history of elevated sed rate and recent eye pain, did add sed rate to the labs today.  If sed rate elevated then consider tapered prednisone course.  Your abdomen pain has resolved except some faint tenderness found on exam.  Did seem to respond to Cipro and Flagyl.  Based on your GI history we will go ahead and refer you back to your GI MD.  If your pain worsens again pending that GI appointment then let me know and I would get CT abdomen and pelvis.  If severe recurrent acute pain after  hours then recommend ED evaluation.  Follow-up in 2 to 3 weeks or as needed.  40 minute spent with pt. 50% of time spent counseling on various condition, explaining work up plan and coordinating care/referrals.

## 2018-04-27 DIAGNOSIS — N898 Other specified noninflammatory disorders of vagina: Secondary | ICD-10-CM | POA: Diagnosis not present

## 2018-04-28 DIAGNOSIS — G4733 Obstructive sleep apnea (adult) (pediatric): Secondary | ICD-10-CM | POA: Diagnosis not present

## 2018-04-28 LAB — VITAMIN B1

## 2018-04-29 ENCOUNTER — Ambulatory Visit: Payer: Self-pay | Admitting: *Deleted

## 2018-04-29 NOTE — Telephone Encounter (Signed)
Patient called in with c/o "diarrhea." She says "I've been having diarrhea since 04/14/18 right before I came to see Percell Miller. I took the antibiotics and still have diarrhea. I would like to know if this is something I should expect or will it slow up? He mentioned maybe diverticulitis, so is this what it is?" I advised that he mentioned a CT Abdomen if the pain reoccurs, she says "the pain is much better. I really don't have any pain." I asked how many stools, she says "they are loose and about 2-3 times /day." I asked is she able to keep down liquids, she says "yes, I'm not nauseated or vomiting at all." Home care advice given, patient verbalized understanding. I advised this will be sent to [facility] and someone will call back with his recommendation, she verbalized understanding.    Reason for Disposition . MILD-MODERATE diarrhea (e.g., 1-6 times / day more than normal)  Answer Assessment - Initial Assessment Questions 1. DIARRHEA SEVERITY: "How bad is the diarrhea?" "How many extra stools have you had in the past 24 hours than normal?"    - NO DIARRHEA (SCALE 0)   - MILD (SCALE 1-3): Few loose or mushy BMs; increase of 1-3 stools over normal daily number of stools; mild increase in ostomy output.   -  MODERATE (SCALE 4-7): Increase of 4-6 stools daily over normal; moderate increase in ostomy output. * SEVERE (SCALE 8-10; OR 'WORST POSSIBLE'): Increase of 7 or more stools daily over normal; moderate increase in ostomy output; incontinence.     A couple of times a day 2. ONSET: "When did the diarrhea begin?"      04/14/18 3. BM CONSISTENCY: "How loose or watery is the diarrhea?"      Loose 4. VOMITING: "Are you also vomiting?" If so, ask: "How many times in the past 24 hours?"      No 5. ABDOMINAL PAIN: "Are you having any abdominal pain?" If yes: "What does it feel like?" (e.g., crampy, dull, intermittent, constant)      Not the pain like I was having in the beginning. 6. ABDOMINAL PAIN SEVERITY:  If present, ask: "How bad is the pain?"  (e.g., Scale 1-10; mild, moderate, or severe)   - MILD (1-3): doesn't interfere with normal activities, abdomen soft and not tender to touch    - MODERATE (4-7): interferes with normal activities or awakens from sleep, tender to touch    - SEVERE (8-10): excruciating pain, doubled over, unable to do any normal activities       1-2 7. ORAL INTAKE: If vomiting, "Have you been able to drink liquids?" "How much fluids have you had in the past 24 hours?"    No vomiting 8. HYDRATION: "Any signs of dehydration?" (e.g., dry mouth [not just dry lips], too weak to stand, dizziness, new weight loss) "When did you last urinate?"     No signs of dehydration; urinating fine 9. EXPOSURE: "Have you traveled to a foreign country recently?" "Have you been exposed to anyone with diarrhea?" "Could you have eaten any food that was spoiled?"     No 10. ANTIBIOTIC USE: "Are you taking antibiotics now or have you taken antibiotics in the past 2 months?"       Yes; Cipro/Flagyl 11. OTHER SYMPTOMS: "Do you have any other symptoms?" (e.g., fever, blood in stool)       No 12. PREGNANCY: "Is there any chance you are pregnant?" "When was your last menstrual period?"  No  Protocols used: DIARRHEA-A-AH

## 2018-04-29 NOTE — Telephone Encounter (Signed)
Patient is calling because she has been experiencing diarrhea for 2 week. Lorraine Conrad gave her 2 different amitotics but she is still having the diarrhea. [phone]  Attempted to call pt to discuss symptoms but no answer at this time.Mailbox if full and unable to leave a message.

## 2018-04-30 NOTE — Telephone Encounter (Signed)
If she not feeling having signs/symptoms of dehyrdation and only 2-3 loose stools a day then can great with immodium, bland foods and hydrate with propel over weekend. If feels dehydrated, watery stools and weak then urgent care of ED. She may need testing for c dif since was on antibiotic. If abdomen pain then CT as at one point appeared possible diverticulitis.  Can make appointment with me on Monday if not feeling real sick.

## 2018-05-04 ENCOUNTER — Other Ambulatory Visit: Payer: BLUE CROSS/BLUE SHIELD

## 2018-05-04 ENCOUNTER — Telehealth: Payer: Self-pay | Admitting: Gastroenterology

## 2018-05-04 DIAGNOSIS — R197 Diarrhea, unspecified: Secondary | ICD-10-CM

## 2018-05-04 NOTE — Telephone Encounter (Signed)
The pt has a history of diverticulitis, she was given cipro and flagyl 11/7 by Dr Harvie Heck. The abd pain resolved.   She has had diarrhea 2-3 loose stools daily for the past month.  She states she had diarrhea prior to the abx use. No bleeding or abd pain.  No fever.  She has an appt with Anderson Malta on 12/10.   She was advised by her PCP to try imodium.  Any further req's?

## 2018-05-04 NOTE — Telephone Encounter (Signed)
Patient called back- message from provider relayed- she states she is still having bouts of diarrhea- she is hydrating - but has not tried anything to stop the diarrhea. Patient has also not heard from GI referral. She is going to continue to hydrate and try Imodium. She is going to call GI to see if they have referral and make appointment. She has f/u appointment 12/6 and if she feels she needs to come in before that she will call back to let PCP know. Please call her if PCP feels she needs to come back earlier.

## 2018-05-04 NOTE — Telephone Encounter (Signed)
Error voice mail is full  

## 2018-05-04 NOTE — Telephone Encounter (Signed)
Come in to lab soon for GI pathogen panel Avoid milk products, high fat foods until diarrhea improves

## 2018-05-04 NOTE — Telephone Encounter (Signed)
Saw that she did contact GI office and got advise. Recommend follow GI MD advise. If symptoms worsen or change over thanksgiving holidays when offices closed then recommend ED evaluation.

## 2018-05-04 NOTE — Telephone Encounter (Signed)
Left message on machine to call back  

## 2018-05-04 NOTE — Telephone Encounter (Signed)
Patient states she is having a diverticulitis flare up and diarrhea for the last 3 weeks. Patient wanting advice until appt on 12.10.19 with [facility].

## 2018-05-04 NOTE — Telephone Encounter (Signed)
Tried to call vm is full. Okay for PEC to give information.

## 2018-05-04 NOTE — Telephone Encounter (Signed)
Patient called on cell phone listed, mailbox is full and unable to leave a message.

## 2018-05-04 NOTE — Telephone Encounter (Signed)
The patient has been notified of this information and all questions answered. Stool test entered.

## 2018-05-05 NOTE — Telephone Encounter (Signed)
See below

## 2018-05-05 NOTE — Telephone Encounter (Signed)
Author phoned pt. to follow-up on GI symptoms. Pt. stated her appointment with GI is on 12/10, scheduled to see Percell Miller 12/6. Pt. Denied any new concerns or needs at this time. Routed to [place] as FYI.

## 2018-05-14 ENCOUNTER — Ambulatory Visit (INDEPENDENT_AMBULATORY_CARE_PROVIDER_SITE_OTHER): Payer: BLUE CROSS/BLUE SHIELD | Admitting: Medical

## 2018-05-14 ENCOUNTER — Encounter: Payer: Self-pay | Admitting: Medical

## 2018-05-14 VITALS — BP 127/85 | HR 81 | Temp 97.9°F | Resp 16 | Ht 64.0 in | Wt 220.0 lb

## 2018-05-14 DIAGNOSIS — B029 Zoster without complications: Secondary | ICD-10-CM | POA: Diagnosis not present

## 2018-05-14 DIAGNOSIS — J3489 Other specified disorders of nose and nasal sinuses: Secondary | ICD-10-CM | POA: Diagnosis not present

## 2018-05-14 DIAGNOSIS — R700 Elevated erythrocyte sedimentation rate: Secondary | ICD-10-CM | POA: Diagnosis not present

## 2018-05-14 LAB — SEDIMENTATION RATE: SED RATE: 16 mm/h (ref 0–20)

## 2018-05-14 MED ORDER — FAMCICLOVIR 500 MG PO TABS: 500.0000 mg | ORAL_TABLET | Freq: Three times a day (TID) | ORAL | 0 refills | Status: DC

## 2018-05-14 MED ORDER — FLUTICASONE PROPIONATE 50 MCG/ACT NA SUSP: 2.0000 | Freq: Every day | NASAL | 1 refills | Status: DC

## 2018-05-14 MED ORDER — DOXYCYCLINE HYCLATE 100 MG PO TABS: 100.0000 mg | ORAL_TABLET | Freq: Two times a day (BID) | ORAL | 0 refills | Status: DC

## 2018-05-14 MED ORDER — NEOMYCIN-POLYMYXIN-HC 3.5-10000-1 OT SOLN: OTIC | 0 refills | Status: DC

## 2018-05-14 MED ORDER — MUPIROCIN 2 % EX OINT: 1.0000 "application " | TOPICAL_OINTMENT | Freq: Two times a day (BID) | CUTANEOUS | 0 refills | Status: DC

## 2018-05-14 NOTE — Progress Notes (Signed)
Subjective:    Patient ID: Lorraine Conrad, female    DOB: 05/15/1971, 47 y.o.   MRN: [id number]  HPI   Pt in with some pain on lt side of her face. Pt not sure what is cause. She states some left sinus pain, lt ear pain, some pain on left frontal area. Pt does mention that she is scheduled to have crown placed on Monday. No tooth pain. Pt not on antibiotic. Pt had cleaning few weeks ago and placement of crown this past Monday. But no tooth pap  Also using cpap recently.   On review pt does not have skin rash or blister outbreak.      Review of Systems  Constitutional: Negative for chills, fatigue and fever.  HENT: Positive for congestion and ear pain. Negative for facial swelling, mouth sores, postnasal drip, sinus pressure and sinus pain.        Tenderness to left side nostril high up.   Some described regional tragal tenderness.  Eyes:       Some mild left eye slight burning sensation.  Respiratory: Negative for chest tightness, shortness of breath and wheezing.   Cardiovascular: Negative for chest pain and palpitations.  Gastrointestinal: Negative for abdominal pain.  Musculoskeletal: Negative for back pain.  Skin: Negative for rash.  Neurological: Negative for dizziness, numbness and headaches.  Hematological: Negative for adenopathy. Does not bruise/bleed easily.  Psychiatric/Behavioral: Negative for behavioral problems and confusion.    Past Medical History:  Diagnosis Date  . Allergy    SEASONAL  . Anemia   . Arthritis    "spine" (03/01/2014)  . Asthma   . Chronic lower back pain   . Endometriosis   . Erosive gastritis   . External hemorrhoids   . GERD (gastroesophageal reflux disease)   . H/O shortness of breath 2014   Cardiopulmonary exercise test results  . Headache    "probably monthly" (03/01/2014)  . Migraine 1998; 02/2014   "this one's lasted 10 days straight" (03/01/2014)  . OSA on CPAP   . Ovarian cyst, left   . Proctalgia fugax   . Sleep apnea      on cpap  . Thyroid disease    thyroid nodule     Social History   Socioeconomic History  . Marital status: Married    Spouse name: Not on file  . Number of children: 2  . Years of education: Not on file  . Highest education level: Not on file  Occupational History  . Occupation: homemaker  Social Needs  . Financial resource strain: Not on file  . Food insecurity:    Worry: Not on file    Inability: Not on file  . Transportation needs:    Medical: Not on file    Non-medical: Not on file  Tobacco Use  . Smoking status: Never Smoker  . Smokeless tobacco: Never Used  . Tobacco comment: father smoked in house growing up per pt.   Substance and Sexual Activity  . Alcohol use: Yes    Alcohol/week: 0.0 standard drinks    Comment: 12-23-16 "I'll have 1-2 drinks maybe 3-4 times/yr"  . Drug use: No  . Sexual activity: Yes    Birth control/protection: Surgical  Lifestyle  . Physical activity:    Days per week: Not on file    Minutes per session: Not on file  . Stress: Not on file  Relationships  . Social connections:    Talks on phone: Not on file  Gets together: Not on file    Attends religious service: Not on file    Active member of club or organization: Not on file    Attends meetings of clubs or organizations: Not on file    Relationship status: Not on file  . Intimate partner violence:    Fear of current or ex partner: Not on file    Emotionally abused: Not on file    Physically abused: Not on file    Forced sexual activity: Not on file  Other Topics Concern  . Not on file  Social History Narrative   Daily caffiene use: 6 cups per day   Patient does not get regular excercise    Past Surgical History:  Procedure Laterality Date  . ABDOMINAL HYSTERECTOMY  04/2013  . BREAST BIOPSY Bilateral 10/05/2000  . BREAST EXCISIONAL BIOPSY Left 1991  . CESAREAN SECTION  2002; 2006  . COLONOSCOPY    . LAPAROSCOPIC OVARIAN CYSTECTOMY  ~ 1999  . NASAL SEPTUM SURGERY  ~  1991  . OOPHORECTOMY Right 2015  . TEMPOROMANDIBULAR JOINT SURGERY  ~ 28  . UPPER GASTROINTESTINAL ENDOSCOPY      Family History  Problem Relation Age of Onset  . Prostate cancer Paternal Grandfather   . Colon polyps Paternal Grandfather   . Colon polyps Father   . Heart disease Father   . Diverticulitis Mother   . Colon polyps Paternal Grandmother   . Breast cancer Paternal Grandmother   . Colon cancer Neg Hx   . Esophageal cancer Neg Hx   . Rectal cancer Neg Hx   . Stomach cancer Neg Hx   . Pancreatic cancer Neg Hx     Allergies  Allergen Reactions  . Promethazine Hcl Other (See Comments)    Violent tremors   . Cephalexin Diarrhea and Itching  . Codeine Other (See Comments)    Was a child when she took this.  . Dilaudid [Hydromorphone] Nausea And Vomiting  . Soy Allergy Itching  . Azithromycin Rash    Head to toe rash    Current Outpatient Medications on File Prior to Visit  Medication Sig Dispense Refill  . azelastine (ASTELIN) 0.1 % nasal spray Place 2 sprays into both nostrils 2 (two) times daily. Use in each nostril as directed (Patient taking differently: Place 2 sprays into both nostrils as needed. Use in each nostril as directed) 30 mL 3  . beclomethasone (QVAR) 40 MCG/ACT inhaler Inhale 2 puffs into the lungs 2 (two) times daily at 10 AM and 5 PM. (Patient taking differently: Inhale 2 puffs into the lungs as needed. ) 1 Inhaler 3  . benzonatate (TESSALON) 100 MG capsule Take 1 capsule (100 mg total) by mouth 3 (three) times daily as needed for cough. 30 capsule 0  . conjugated estrogens (PREMARIN) vaginal cream as needed    . Cyanocobalamin (VITAMIN B-12) 1000 MCG SUBL Place under the tongue as needed.     . cyclobenzaprine (FLEXERIL) 10 MG tablet Take 10 mg by mouth as needed.     . diclofenac (VOLTAREN) 75 MG EC tablet Take 1 tablet (75 mg total) by mouth 2 (two) times daily. (Patient taking differently: Take 75 mg by mouth as needed. ) 30 tablet 1  .  doxycycline (VIBRA-TABS) 100 MG tablet Take 1 tablet (100 mg total) by mouth 2 (two) times daily. Can give capsules or generic 20 tablet 0  . fluticasone (FLONASE) 50 MCG/ACT nasal spray Place 2 sprays into both nostrils daily. 16 g  1  . guaiFENesin (MUCINEX) 600 MG 12 hr tablet Take 1,200 mg by mouth as needed.     . hydrOXYzine (ATARAX/VISTARIL) 10 MG tablet Take 1 tablet (10 mg total) by mouth 3 (three) times daily as needed for itching. 30 tablet 0  . lansoprazole (PREVACID) 30 MG capsule Take 30 mg by mouth as needed.     Marland Kitchen levocetirizine (XYZAL) 5 MG tablet Take 1 tablet (5 mg total) by mouth every evening. 30 tablet 3  . loratadine (CLARITIN) 10 MG tablet Take 1 tablet (10 mg total) by mouth daily. (Patient taking differently: Take 10 mg by mouth daily as needed. ) 30 tablet 0  . methocarbamol (ROBAXIN) 500 MG tablet Take 1 tablet (500 mg total) by mouth every 6 (six) hours as needed for muscle spasms. (Patient taking differently: Take 500 mg by mouth as needed for muscle spasms. ) 20 tablet 0  . montelukast (SINGULAIR) 10 MG tablet Take 1 tablet (10 mg total) by mouth at bedtime. (Patient taking differently: Take 10 mg by mouth at bedtime as needed. ) 30 tablet 2  . montelukast (SINGULAIR) 10 MG tablet Take 1 tablet (10 mg total) by mouth at bedtime. 30 tablet 3  . ondansetron (ZOFRAN ODT) 8 MG disintegrating tablet Take 1 tablet (8 mg total) by mouth every 8 (eight) hours as needed for nausea or vomiting. 21 tablet 0  . oxybutynin (DITROPAN-XL) 5 MG 24 hr tablet Take 1 tablet (5 mg total) by mouth at bedtime. 30 tablet 0  . predniSONE (DELTASONE) 10 MG tablet 6 day taper dose.(6, 5, 4 ,3 , 2 , 1) 21 tablet 0  . PROAIR HFA 108 (90 Base) MCG/ACT inhaler USE 2 PUFFS INTO LUNGS EVERY 6 HOURS AS NEEDED FOR WHEEZING OR SHORTNESS OF BREATH 8.5 Inhaler 3  . ranitidine (ZANTAC) 150 MG capsule Take 1 capsule (150 mg total) by mouth 2 (two) times daily. (Patient taking differently: Take 150 mg by mouth  as needed. ) 60 capsule 0   No current facility-administered medications on file prior to visit.     BP 127/85   Pulse 81   Temp 97.9 F (36.6 C) (Oral)   Resp 16   Ht 5\' 4"  (1.626 m)   Wt 220 lb (99.8 kg)   LMP 02/08/2013   SpO2 99%   BMI 37.76 kg/m       Objective:   Physical Exam  General  Mental Status - Alert. General Appearance - Well groomed. Not in acute distress.  Skin Rashes- No Rashes. Upon inspection no rash on skin. No vesicles. No dilated veins in temporal region.  HEENT Head- Normal. Ear Auditory Canal - Left- Normal(but tragal tenderness). Right - Normal.Tympanic Membrane- Left- Normal. Right- Normal. Eye Sclera/Conjunctiva- Left- Normal. Right- Normal. Nose & Sinuses Nasal Mucosa- Left-  Boggy and Congested. Right-  Boggy and  Congested.Bilateral left side maxillary(medial aspect adjacent to nares and frontal sinus pressure. Inside left nare tender when used qtip to palpate upper nostril area. Mouth & Throat Lips: Upper Lip- Normal: no dryness, cracking, pallor, cyanosis, or vesicular eruption. Lower Lip-Normal: no dryness, cracking, pallor, cyanosis or vesicular eruption. Buccal Mucosa- Bilateral- No Aphthous ulcers. Oropharynx- No Discharge or Erythema. Tonsils: Characteristics- Bilateral- No Erythema or Congestion. Size/Enlargement- Bilateral- No enlargement. Discharge- bilateral-None.  Neck Neck- Supple. No Masses.   Chest and Lung Exam Auscultation: Breath Sounds:-Clear even and unlabored.  Cardiovascular Auscultation:Rythm- Regular, rate and rhythm. Murmurs & Other Heart Sounds:Ausculatation of the heart reveal- No Murmurs.  Lymphatic Head & Neck General Head & Neck Lymphatics: Bilateral: Description- No Localized lymphadenopathy.       Assessment & Plan:  You do have recent sinus region pain, mild left eye discomfort as well as left side forehead discomfort.  No rash seen on your face but this distribution of pain causes some  concern for potential early shingles as we approached the weekend.  So I am going to prescribe Famvir antiviral and recommended you started today.  If you do not want to start it now that at least started for increased pain or any rash outbreak on the face as explained.  For nares tenderness left side, I am going to prescribe mupirocin ointment you can use a Q-tip and apply ointment twice daily.  This can treat bacterial infection that colonizes nose.  For some nasal congestion, prescribed Flonase.  Presently not convinced that you have sinus infection or tooth infection.  Also you have had recent GI illness with diarrhea so hesitant to give oral antibiotic.  But if you have worse sinus pressure type symptoms/infection symptoms then giving you a print prescription of doxycycline.  Rx advisement given.  History of intermittent elevated sed rate.  No temporal region pain presently but you did not take the prednisone that I prescribed last time your sed rate was elevated.  If sed rate elevated the would advise taking the prednisone. Will check level today and notify you  Follow-up in 7 to 10 days or as needed.  Mackie Pai, PA-C

## 2018-05-14 NOTE — Patient Instructions (Addendum)
You do have recent sinus region pain, mild left eye discomfort as well as left side forehead discomfort.  No rash seen on your face but this distribution of pain causes some concern for potential early shingles as we approached the weekend.  So I am going to prescribe Famvir antiviral and recommended you started today.  If you do not want to start it now that at least started for increased pain or any rash outbreak on the face as explained.  For nares tenderness left side, I am going to prescribe mupirocin ointment you can use a Q-tip and apply ointment twice daily.  This can treat bacterial infection that colonizes nose.  For some nasal congestion, prescribed Flonase.  Presently not convinced that you have sinus infection or tooth infection.  Also you have had recent GI illness with diarrhea so hesitant to give oral antibiotic.  But if you have worse sinus pressure type symptoms/infection symptoms then giving you a print prescription of doxycycline.  Rx advisement given.  History of intermittent elevated sed rate.  No temporal region pain presently but you did not take the prednisone that I prescribed last time your sed rate was elevated.  If sed rate elevated then would advise taking the prednisone. Will check level today and notify you.  Follow-up in 7 to 10 days or as needed.

## 2018-05-18 ENCOUNTER — Ambulatory Visit (INDEPENDENT_AMBULATORY_CARE_PROVIDER_SITE_OTHER): Payer: BLUE CROSS/BLUE SHIELD | Admitting: Physician Assistant

## 2018-05-18 ENCOUNTER — Other Ambulatory Visit (INDEPENDENT_AMBULATORY_CARE_PROVIDER_SITE_OTHER): Payer: BLUE CROSS/BLUE SHIELD

## 2018-05-18 ENCOUNTER — Encounter: Payer: Self-pay | Admitting: Physician Assistant

## 2018-05-18 VITALS — BP 123/77 | HR 83 | Ht 64.0 in | Wt 218.2 lb

## 2018-05-18 DIAGNOSIS — R1032 Left lower quadrant pain: Secondary | ICD-10-CM

## 2018-05-18 LAB — CBC WITH DIFFERENTIAL/PLATELET
BASOS PCT: 1 % (ref 0.0–3.0)
Basophils Absolute: 0.1 10*3/uL (ref 0.0–0.1)
EOS PCT: 2.3 % (ref 0.0–5.0)
Eosinophils Absolute: 0.2 10*3/uL (ref 0.0–0.7)
HEMATOCRIT: 42.1 % (ref 36.0–46.0)
HEMOGLOBIN: 14.1 g/dL (ref 12.0–15.0)
LYMPHS PCT: 26 % (ref 12.0–46.0)
Lymphs Abs: 2.2 10*3/uL (ref 0.7–4.0)
MCHC: 33.5 g/dL (ref 30.0–36.0)
MCV: 83.4 fl (ref 78.0–100.0)
Monocytes Absolute: 0.5 10*3/uL (ref 0.1–1.0)
Monocytes Relative: 6.1 % (ref 3.0–12.0)
Neutro Abs: 5.5 10*3/uL (ref 1.4–7.7)
Neutrophils Relative %: 64.6 % (ref 43.0–77.0)
Platelets: 438 10*3/uL — ABNORMAL HIGH (ref 150.0–400.0)
RBC: 5.04 Mil/uL (ref 3.87–5.11)
RDW: 14.1 % (ref 11.5–15.5)
WBC: 8.5 10*3/uL (ref 4.0–10.5)

## 2018-05-18 NOTE — Progress Notes (Signed)
Chief Complaint: Left lower quadrant pain, diarrhea  HPI:    Lorraine Conrad is a 47 year old Caucasian female with a past medical history as listed below, known to Dr. Fuller Plan, who presents clinic today with complaint of left lower quadrant pain and diarrhea.    03/25/2017 colonoscopy with perianal skin tag found on perianal exam, internal hemorrhoids and otherwise normal exam.  Repeat recommended 10 years.    04/22/2018 CBC, CMP normal.  05/14/2018 ESR normal.    Today, patient explains that she initially saw her PCP at the beginning of November and was diagnosed with likely diverticulitis and given a round of Cipro/Flagyl x10 days.  Tells me she took her antibiotics and her abdominal pain was relieved.  She continued with loose stools though until Thanksgiving day.  After eating Thanksgiving food patient tells me that her stools returned to normal/solid.  She was doing well over the past few weeks until yesterday when she started again with this left-sided pain more in the midline of the left side of abdomen which she describes as the same place as before, this has increased over the past day and is now rated as a 4/10.  Describes that she also had a little bit of diarrhea yesterday and this morning as well as a lot of gas.  Denies any rectal bleeding.    Reminds me that her normal bowel habits radiate from solid to loose within 24-hour time period.  This has been normal for her over the past many years of her life.    Denies fever, chills, weight loss, anorexia, nausea, vomiting, heartburn, reflux or symptoms that awaken her from sleep.  Past Medical History:  Diagnosis Date  . Allergy    SEASONAL  . Anemia   . Arthritis    "spine" (03/01/2014)  . Asthma   . Chronic lower back pain   . Endometriosis   . Erosive gastritis   . External hemorrhoids   . GERD (gastroesophageal reflux disease)   . H/O shortness of breath 2014   Cardiopulmonary exercise test results  . Headache    "probably  monthly" (03/01/2014)  . Migraine 1998; 02/2014   "this one's lasted 10 days straight" (03/01/2014)  . OSA on CPAP   . Ovarian cyst, left   . Proctalgia fugax   . Sleep apnea    on cpap  . Thyroid disease    thyroid nodule    Past Surgical History:  Procedure Laterality Date  . ABDOMINAL HYSTERECTOMY  04/2013  . BREAST BIOPSY Bilateral 10/05/2000  . BREAST EXCISIONAL BIOPSY Left 1991  . CESAREAN SECTION  2002; 2006  . COLONOSCOPY    . LAPAROSCOPIC OVARIAN CYSTECTOMY  ~ 1999  . NASAL SEPTUM SURGERY  ~ 1991  . OOPHORECTOMY Right 2015  . TEMPOROMANDIBULAR JOINT SURGERY  ~ 43  . UPPER GASTROINTESTINAL ENDOSCOPY      Current Outpatient Medications  Medication Sig Dispense Refill  . azelastine (ASTELIN) 0.1 % nasal spray Place 2 sprays into both nostrils 2 (two) times daily. Use in each nostril as directed (Patient taking differently: Place 2 sprays into both nostrils as needed. Use in each nostril as directed) 30 mL 3  . beclomethasone (QVAR) 40 MCG/ACT inhaler Inhale 2 puffs into the lungs 2 (two) times daily at 10 AM and 5 PM. (Patient taking differently: Inhale 2 puffs into the lungs as needed. ) 1 Inhaler 3  . conjugated estrogens (PREMARIN) vaginal cream as needed    . Cyanocobalamin (VITAMIN B-12) 1000 MCG  SUBL Place under the tongue as needed.     . fluticasone (FLONASE) 50 MCG/ACT nasal spray Place 2 sprays into both nostrils daily. 16 g 1  . hydrOXYzine (ATARAX/VISTARIL) 10 MG tablet Take 1 tablet (10 mg total) by mouth 3 (three) times daily as needed for itching. 30 tablet 0  . levocetirizine (XYZAL) 5 MG tablet Take 1 tablet (5 mg total) by mouth every evening. 30 tablet 3  . loratadine (CLARITIN) 10 MG tablet Take 1 tablet (10 mg total) by mouth daily. (Patient taking differently: Take 10 mg by mouth daily as needed. ) 30 tablet 0  . montelukast (SINGULAIR) 10 MG tablet Take 1 tablet (10 mg total) by mouth at bedtime. (Patient taking differently: Take 10 mg by mouth at  bedtime as needed. ) 30 tablet 2  . montelukast (SINGULAIR) 10 MG tablet Take 1 tablet (10 mg total) by mouth at bedtime. 30 tablet 3  . mupirocin ointment (BACTROBAN) 2 % Place 1 application into the nose 2 (two) times daily. 22 g 0  . oxybutynin (DITROPAN-XL) 5 MG 24 hr tablet Take 1 tablet (5 mg total) by mouth at bedtime. 30 tablet 0  . PROAIR HFA 108 (90 Base) MCG/ACT inhaler USE 2 PUFFS INTO LUNGS EVERY 6 HOURS AS NEEDED FOR WHEEZING OR SHORTNESS OF BREATH 8.5 Inhaler 3  . ranitidine (ZANTAC) 150 MG capsule Take 1 capsule (150 mg total) by mouth 2 (two) times daily. (Patient taking differently: Take 150 mg by mouth as needed. ) 60 capsule 0  . predniSONE (DELTASONE) 10 MG tablet 6 day taper dose.(6, 5, 4 ,3 , 2 , 1) (Patient not taking: Reported on 05/18/2018) 21 tablet 0   No current facility-administered medications for this visit.     Allergies as of 05/18/2018 - Review Complete 05/18/2018  Allergen Reaction Noted  . Promethazine hcl Other (See Comments) 11/15/2007  . Cephalexin Diarrhea and Itching 02/13/2015  . Codeine Other (See Comments) 11/15/2007  . Dilaudid [hydromorphone] Nausea And Vomiting 02/24/2014  . Soy allergy Itching 02/25/2017  . Azithromycin Rash 07/31/2011    Family History  Problem Relation Age of Onset  . Prostate cancer Paternal Grandfather   . Colon polyps Paternal Grandfather   . Colon polyps Father   . Heart disease Father   . Diverticulitis Mother   . Colon polyps Paternal Grandmother   . Breast cancer Paternal Grandmother   . Colon cancer Neg Hx   . Esophageal cancer Neg Hx   . Rectal cancer Neg Hx   . Stomach cancer Neg Hx   . Pancreatic cancer Neg Hx     Social History   Socioeconomic History  . Marital status: Married    Spouse name: Not on file  . Number of children: 2  . Years of education: Not on file  . Highest education level: Not on file  Occupational History  . Occupation: homemaker  Social Needs  . Financial resource  strain: Not on file  . Food insecurity:    Worry: Not on file    Inability: Not on file  . Transportation needs:    Medical: Not on file    Non-medical: Not on file  Tobacco Use  . Smoking status: Never Smoker  . Smokeless tobacco: Never Used  . Tobacco comment: father smoked in house growing up per pt.   Substance and Sexual Activity  . Alcohol use: Yes    Alcohol/week: 0.0 standard drinks    Comment: 12-23-16 "I'll have 1-2 drinks maybe  3-4 times/yr"  . Drug use: No  . Sexual activity: Yes    Birth control/protection: Surgical  Lifestyle  . Physical activity:    Days per week: Not on file    Minutes per session: Not on file  . Stress: Not on file  Relationships  . Social connections:    Talks on phone: Not on file    Gets together: Not on file    Attends religious service: Not on file    Active member of club or organization: Not on file    Attends meetings of clubs or organizations: Not on file    Relationship status: Not on file  . Intimate partner violence:    Fear of current or ex partner: Not on file    Emotionally abused: Not on file    Physically abused: Not on file    Forced sexual activity: Not on file  Other Topics Concern  . Not on file  Social History Narrative   Daily caffiene use: 6 cups per day   Patient does not get regular excercise    Review of Systems:    Constitutional: No weight loss, fever or chills Cardiovascular: No chest pain Respiratory: No SOB Gastrointestinal: See HPI and otherwise negative   Physical Exam:  Vital signs: BP 123/77   Pulse 83   Ht '5\' 4"'  (1.626 m)   Wt 218 lb 3.2 oz (99 kg)   LMP 02/08/2013   SpO2 97%   BMI 37.45 kg/m   Constitutional:   Pleasant Caucasian female appears to be in NAD, Well developed, Well nourished, alert and cooperative Respiratory: Respirations even and unlabored. Lungs clear to auscultation bilaterally.   No wheezes, crackles, or rhonchi.  Cardiovascular: Normal S1, S2. No MRG. Regular rate  and rhythm. No peripheral edema, cyanosis or pallor.  Gastrointestinal:  Soft, nondistended, moderate left sided abdominal pain, more in the middle of her abdomen,. No rebound or guarding. Normal bowel sounds. No appreciable masses or hepatomegaly. Psychiatric: Demonstrates good judgement and reason without abnormal affect or behaviors.  RELEVANT LABS AND IMAGING: CBC    Component Value Date/Time   WBC 9.5 04/22/2018 1018   RBC 5.08 04/22/2018 1018   HGB 14.1 04/22/2018 1018   HCT 42.7 04/22/2018 1018   PLT 423.0 (H) 04/22/2018 1018   MCV 84.0 04/22/2018 1018   MCH 28.1 08/23/2015 1300   MCHC 33.2 04/22/2018 1018   RDW 14.3 04/22/2018 1018   LYMPHSABS 2.0 04/22/2018 1018   MONOABS 0.6 04/22/2018 1018   EOSABS 0.2 04/22/2018 1018   BASOSABS 0.0 04/22/2018 1018    CMP     Component Value Date/Time   NA 138 04/22/2018 1018   K 4.3 04/22/2018 1018   CL 104 04/22/2018 1018   CO2 26 04/22/2018 1018   GLUCOSE 92 04/22/2018 1018   BUN 12 04/22/2018 1018   CREATININE 0.99 04/22/2018 1018   CREATININE 0.95 03/18/2013 0849   CALCIUM 9.4 04/22/2018 1018   PROT 7.5 04/22/2018 1018   ALBUMIN 4.3 04/22/2018 1018   AST 24 04/22/2018 1018   ALT 23 04/22/2018 1018   ALKPHOS 84 04/22/2018 1018   BILITOT 0.4 04/22/2018 1018   GFRNONAA >60 08/23/2015 1300   GFRAA >60 08/23/2015 1300    Assessment: 1.  Left-sided abdominal pain: Treated initially with Cipro and Flagyl and got better, now has returned over the past 48 hours; consider diverticulitis versus other 2.  Diarrhea: Slightly increased per the patient's normal, was better over the past few weeks,  but started again yesterday with abdominal pain; Consider relation to above versus IBS  Plan: 1.  Ordered labs to include a CBC and CMP 2.  Ordered a CT abdomen pelvis with contrast for further evaluation 3.  Patient will follow with me in 3 to 4 weeks or sooner if necessary.  If CT is normal would recommend some Dicyclomine/Hyoscyamine.   If patient's diarrhea increases would recommend stool studies as ordered including GI path panel.  Ellouise Newer, PA-C [facility] 05/18/2018, 10:39 AM  Cc: Mackie Pai, PA-C

## 2018-05-18 NOTE — Progress Notes (Signed)
Reviewed and agree with management plan. Colonoscopy in 03/2017 did not show diverticulosis.   Pricilla Riffle. Fuller Plan, MD [facility]

## 2018-05-18 NOTE — Patient Instructions (Signed)
Please go to the basement level to have your labs drawn.   You have been scheduled for a CT scan of the abdomen and pelvis at [facility].   You are scheduled on Thursday 12-12 at  10:30 amYou should arrive 15 minutes prior to your appointment time for registration. Please follow the written instructions below on the day of your exam:  WARNING: IF YOU ARE ALLERGIC TO IODINE/X-RAY DYE, PLEASE NOTIFY RADIOLOGY IMMEDIATELY AT [phone]! YOU WILL BE GIVEN A 13 HOUR PREMEDICATION PREP.  1) Do not eat anything after 6:30 am(4 hours prior to your test) 2) You have been given 2 bottles of oral contrast to drink. The solution may taste better if refrigerated, but do NOT add ice or any other liquid to this solution. Shake well before drinking.    Drink 1 bottle of contrast @ 8:30 am (2 hours prior to your exam)  Drink 1 bottle of contrast @ 9:30 am (1 hour prior to your exam)  You may take any medications as prescribed with a small amount of water, if necessary. If you take any of the following medications: METFORMIN, GLUCOPHAGE, GLUCOVANCE, AVANDAMET, RIOMET, FORTAMET, [place] MET, JANUMET, GLUMETZA or METAGLIP, you MAY be asked to HOLD this medication 48 hours AFTER the exam.  The purpose of you drinking the oral contrast is to aid in the visualization of your intestinal tract. The contrast solution may cause some diarrhea. Depending on your individual set of symptoms, you may also receive an intravenous injection of x-ray contrast/dye. Plan on being at [facility] for 30 minutes or longer, depending on the type of exam you are having performed.  This test typically takes 30-45 minutes to complete.  If you have any questions regarding your exam or if you need to reschedule, you may call the CT department at [phone] between the hours of 8:00 am and 5:00 pm, Monday-Friday.  ________________________________________________________________________Normal BMI (Body Mass Index- based  on height and weight) is between 19 and 25. Your BMI today is Body mass index is 37.45 kg/m. Marland Kitchen Please consider follow up  regarding your BMI with your Primary Care Provider.

## 2018-05-20 ENCOUNTER — Ambulatory Visit (HOSPITAL_COMMUNITY)
Admission: RE | Admit: 2018-05-20 | Discharge: 2018-05-20 | Disposition: A | Discharge status: Home / Self Care | Payer: BLUE CROSS/BLUE SHIELD | Source: Ambulatory Visit | Attending: Physician Assistant | Admitting: Physician Assistant

## 2018-05-20 ENCOUNTER — Telehealth: Payer: Self-pay | Admitting: Physician Assistant

## 2018-05-20 DIAGNOSIS — R1032 Left lower quadrant pain: Secondary | ICD-10-CM

## 2018-05-20 DIAGNOSIS — K439 Ventral hernia without obstruction or gangrene: Secondary | ICD-10-CM | POA: Diagnosis not present

## 2018-05-20 MED ORDER — IOHEXOL 300 MG/ML  SOLN
100.0000 mL | Freq: Once | INTRAMUSCULAR | Status: AC | PRN
  Administered 2018-05-20: 100 mL via INTRAVENOUS

## 2018-05-20 MED ORDER — SODIUM CHLORIDE (PF) 0.9 % IJ SOLN
INTRAMUSCULAR | Status: AC
  Filled 2018-05-20: qty 50

## 2018-05-20 MED ORDER — HYOSCYAMINE SULFATE 0.125 MG SL SUBL: 0.1250 mg | SUBLINGUAL_TABLET | SUBLINGUAL | 0 refills | Status: DC | PRN

## 2018-05-20 NOTE — Telephone Encounter (Signed)
Notes recorded by Levin Erp, PA on 05/20/2018 at 1:36 PM EST No evidence of diverticulitis. If patient's pain is persisting would recommend Hyoscyamine 0.125 mg SL tabs q4-6 hours prn #15 refill x1

## 2018-05-20 NOTE — Telephone Encounter (Signed)
The patient has been notified of this information and all questions answered.  Prescription sent to the pharmacy and ROV scheduled.

## 2018-05-21 ENCOUNTER — Encounter: Payer: BLUE CROSS/BLUE SHIELD | Admitting: Medical

## 2018-05-24 DIAGNOSIS — D2362 Other benign neoplasm of skin of left upper limb, including shoulder: Secondary | ICD-10-CM | POA: Diagnosis not present

## 2018-05-24 DIAGNOSIS — L814 Other melanin hyperpigmentation: Secondary | ICD-10-CM | POA: Diagnosis not present

## 2018-05-24 DIAGNOSIS — L821 Other seborrheic keratosis: Secondary | ICD-10-CM | POA: Diagnosis not present

## 2018-05-24 DIAGNOSIS — Z23 Encounter for immunization: Secondary | ICD-10-CM | POA: Diagnosis not present

## 2018-05-25 DIAGNOSIS — N9983 Residual ovary syndrome: Secondary | ICD-10-CM | POA: Diagnosis not present

## 2018-05-25 DIAGNOSIS — N390 Urinary tract infection, site not specified: Secondary | ICD-10-CM | POA: Diagnosis not present

## 2018-06-07 ENCOUNTER — Encounter: Payer: BLUE CROSS/BLUE SHIELD | Admitting: Medical

## 2018-06-07 DIAGNOSIS — Z0289 Encounter for other administrative examinations: Secondary | ICD-10-CM

## 2018-06-08 ENCOUNTER — Telehealth: Payer: Self-pay | Admitting: Medical

## 2018-06-08 NOTE — Telephone Encounter (Signed)
Pt came into office today 06/08/18 and thought her appt was today and it was actually yesterday. Patient wanted provider to know she apologize for confusing dates and will call back and reschedule.

## 2018-06-09 NOTE — Telephone Encounter (Signed)
Noted regarding pt confusing date of appointment.

## 2018-06-17 ENCOUNTER — Other Ambulatory Visit (INDEPENDENT_AMBULATORY_CARE_PROVIDER_SITE_OTHER): Payer: BLUE CROSS/BLUE SHIELD

## 2018-06-17 ENCOUNTER — Encounter: Payer: Self-pay | Admitting: Physician Assistant

## 2018-06-17 ENCOUNTER — Ambulatory Visit (INDEPENDENT_AMBULATORY_CARE_PROVIDER_SITE_OTHER): Payer: BLUE CROSS/BLUE SHIELD | Admitting: Physician Assistant

## 2018-06-17 VITALS — BP 114/90 | HR 80 | Ht 63.5 in | Wt 218.1 lb

## 2018-06-17 DIAGNOSIS — R197 Diarrhea, unspecified: Secondary | ICD-10-CM | POA: Diagnosis not present

## 2018-06-17 DIAGNOSIS — R1032 Left lower quadrant pain: Secondary | ICD-10-CM

## 2018-06-17 LAB — IGA: IgA: 244 mg/dL (ref 68–378)

## 2018-06-17 MED ORDER — HYOSCYAMINE SULFATE 0.125 MG SL SUBL: 0.1250 mg | SUBLINGUAL_TABLET | Freq: Two times a day (BID) | SUBLINGUAL | 1 refills | Status: DC

## 2018-06-17 NOTE — Progress Notes (Signed)
Chief Complaint: Left lower quadrant pain and diarrhea  HPI:    Lorraine Conrad is a 48 year old Caucasian female with a past medical history as listed below, known to Dr. Fuller Plan, who presents clinic today for follow-up of her left lower quadrant pain and diarrhea.    05/18/2018 office visit with me to discuss that she was initially treated for diverticulitis with a round of Cipro and Flagyl x10 days by her PCP.  She had done well but then started again with left-sided pain and some diarrhea.  At that time CT abdomen pelvis and labs including CBC and CMP.  05/20/2018 CT was normal.  CBC and CMP normal.  Patient was told to start hyoscyamine.    Today, patient tells me that she never took the medication that was called in.  She has had no further episodes of left lower quadrant pain but has continued with occasional diarrheal episodes which is "not normal for me".  Currently patient is not bothered by her symptoms.  Patient does question whether she possibly has a gluten allergy or dairy intolerance as these foods do seem to bother her more than others.  Again tells me that she cannot always go out to eat without having an urgent diarrheal stool.    Denies fever, chills, rectal bleeding or symptoms that awaken her from sleep.  Past Medical History:  Diagnosis Date  . Allergy    SEASONAL  . Anemia   . Arthritis    "spine" (03/01/2014)  . Asthma   . Chronic lower back pain   . Endometriosis   . Erosive gastritis   . External hemorrhoids   . GERD (gastroesophageal reflux disease)   . H/O shortness of breath 2014   Cardiopulmonary exercise test results  . Headache    "probably monthly" (03/01/2014)  . Migraine 1998; 02/2014   "this one's lasted 10 days straight" (03/01/2014)  . OSA on CPAP   . Ovarian cyst, left   . Proctalgia fugax   . Sleep apnea    on cpap  . Thyroid disease    thyroid nodule    Past Surgical History:  Procedure Laterality Date  . ABDOMINAL HYSTERECTOMY  04/2013  .  BREAST BIOPSY Bilateral 10/05/2000  . BREAST EXCISIONAL BIOPSY Left 1991  . CESAREAN SECTION  2002; 2006  . COLONOSCOPY    . LAPAROSCOPIC OVARIAN CYSTECTOMY  ~ 1999  . NASAL SEPTUM SURGERY  ~ 1991  . OOPHORECTOMY Right 2015  . TEMPOROMANDIBULAR JOINT SURGERY  ~ 102  . UPPER GASTROINTESTINAL ENDOSCOPY      Current Outpatient Medications  Medication Sig Dispense Refill  . azelastine (ASTELIN) 0.1 % nasal spray Place 2 sprays into both nostrils 2 (two) times daily. Use in each nostril as directed (Patient taking differently: Place 2 sprays into both nostrils as needed. Use in each nostril as directed) 30 mL 3  . beclomethasone (QVAR) 40 MCG/ACT inhaler Inhale 2 puffs into the lungs 2 (two) times daily at 10 AM and 5 PM. (Patient taking differently: Inhale 2 puffs into the lungs as needed. ) 1 Inhaler 3  . conjugated estrogens (PREMARIN) vaginal cream as needed    . fluticasone (FLONASE) 50 MCG/ACT nasal spray Place 2 sprays into both nostrils daily. 16 g 1  . hydrOXYzine (ATARAX/VISTARIL) 10 MG tablet Take 1 tablet (10 mg total) by mouth 3 (three) times daily as needed for itching. 30 tablet 0  . hyoscyamine (LEVSIN SL) 0.125 MG SL tablet Place 1 tablet (0.125 mg total)  under the tongue every 4 (four) hours as needed. 15 tablet 0  . loratadine (CLARITIN) 10 MG tablet Take 1 tablet (10 mg total) by mouth daily. (Patient taking differently: Take 10 mg by mouth daily as needed. ) 30 tablet 0  . montelukast (SINGULAIR) 10 MG tablet Take 1 tablet (10 mg total) by mouth at bedtime. (Patient taking differently: Take 10 mg by mouth at bedtime as needed. ) 30 tablet 2  . montelukast (SINGULAIR) 10 MG tablet Take 1 tablet (10 mg total) by mouth at bedtime. 30 tablet 3  . oxybutynin (DITROPAN-XL) 5 MG 24 hr tablet Take 1 tablet (5 mg total) by mouth at bedtime. 30 tablet 0  . PROAIR HFA 108 (90 Base) MCG/ACT inhaler USE 2 PUFFS INTO LUNGS EVERY 6 HOURS AS NEEDED FOR WHEEZING OR SHORTNESS OF BREATH 8.5  Inhaler 3  . ranitidine (ZANTAC) 150 MG capsule Take 1 capsule (150 mg total) by mouth 2 (two) times daily. (Patient taking differently: Take 150 mg by mouth as needed. ) 60 capsule 0   No current facility-administered medications for this visit.     Allergies as of 06/17/2018 - Review Complete 06/17/2018  Allergen Reaction Noted  . Promethazine hcl Other (See Comments) 11/15/2007  . Cephalexin Diarrhea and Itching 02/13/2015  . Codeine Other (See Comments) 11/15/2007  . Dilaudid [hydromorphone] Nausea And Vomiting 02/24/2014  . Soy allergy Itching 02/25/2017  . Azithromycin Rash 07/31/2011    Family History  Problem Relation Age of Onset  . Prostate cancer Paternal Grandfather   . Colon polyps Paternal Grandfather   . Colon polyps Father   . Heart disease Father   . Diverticulitis Mother   . Colon polyps Paternal Grandmother   . Breast cancer Paternal Grandmother   . Colon cancer Neg Hx   . Esophageal cancer Neg Hx   . Rectal cancer Neg Hx   . Stomach cancer Neg Hx   . Pancreatic cancer Neg Hx     Social History   Socioeconomic History  . Marital status: Married    Spouse name: Not on file  . Number of children: 2  . Years of education: Not on file  . Highest education level: Not on file  Occupational History  . Occupation: homemaker  Social Needs  . Financial resource strain: Not on file  . Food insecurity:    Worry: Not on file    Inability: Not on file  . Transportation needs:    Medical: Not on file    Non-medical: Not on file  Tobacco Use  . Smoking status: Never Smoker  . Smokeless tobacco: Never Used  . Tobacco comment: father smoked in house growing up per pt.   Substance and Sexual Activity  . Alcohol use: Yes    Alcohol/week: 0.0 standard drinks    Comment: 12-23-16 "I'll have 1-2 drinks maybe 3-4 times/yr"  . Drug use: No  . Sexual activity: Yes    Birth control/protection: Surgical  Lifestyle  . Physical activity:    Days per week: Not on  file    Minutes per session: Not on file  . Stress: Not on file  Relationships  . Social connections:    Talks on phone: Not on file    Gets together: Not on file    Attends religious service: Not on file    Active member of club or organization: Not on file    Attends meetings of clubs or organizations: Not on file    Relationship status:  Not on file  . Intimate partner violence:    Fear of current or ex partner: Not on file    Emotionally abused: Not on file    Physically abused: Not on file    Forced sexual activity: Not on file  Other Topics Concern  . Not on file  Social History Narrative   Daily caffiene use: 6 cups per day   Patient does not get regular excercise    Review of Systems:    Constitutional: No weight loss, fever or chills Cardiovascular: No chest pain  Respiratory: No SOB  Gastrointestinal: See HPI and otherwise negative   Physical Exam:  Vital signs: BP 114/90 (BP Location: Left Arm, Patient Position: Sitting, Cuff Size: Normal)   Pulse 80   Ht 5' 3.5" (1.613 m) Comment: height measured without shoes  Wt 218 lb 2 oz (98.9 kg)   LMP 02/08/2013   BMI 38.03 kg/m   Constitutional:   Pleasant overweight Caucasian female appears to be in NAD, Well developed, Well nourished, alert and cooperative Respiratory: Respirations even and unlabored. Lungs clear to auscultation bilaterally.   No wheezes, crackles, or rhonchi.  Cardiovascular: Normal S1, S2. No MRG. Regular rate and rhythm. No peripheral edema, cyanosis or pallor.  Gastrointestinal:  Soft, nondistended, mild generalized ttp, No rebound or guarding. Normal bowel sounds. No appreciable masses or hepatomegaly. Psychiatric: Demonstrates good judgement and reason without abnormal affect or behaviors.  RELEVANT LABS AND IMAGING: CBC    Component Value Date/Time   WBC 8.5 05/18/2018 1104   RBC 5.04 05/18/2018 1104   HGB 14.1 05/18/2018 1104   HCT 42.1 05/18/2018 1104   PLT 438.0 (H) 05/18/2018 1104     MCV 83.4 05/18/2018 1104   MCH 28.1 08/23/2015 1300   MCHC 33.5 05/18/2018 1104   RDW 14.1 05/18/2018 1104   LYMPHSABS 2.2 05/18/2018 1104   MONOABS 0.5 05/18/2018 1104   EOSABS 0.2 05/18/2018 1104   BASOSABS 0.1 05/18/2018 1104    CMP     Component Value Date/Time   NA 138 04/22/2018 1018   K 4.3 04/22/2018 1018   CL 104 04/22/2018 1018   CO2 26 04/22/2018 1018   GLUCOSE 92 04/22/2018 1018   BUN 12 04/22/2018 1018   CREATININE 0.99 04/22/2018 1018   CREATININE 0.95 03/18/2013 0849   CALCIUM 9.4 04/22/2018 1018   PROT 7.5 04/22/2018 1018   ALBUMIN 4.3 04/22/2018 1018   AST 24 04/22/2018 1018   ALT 23 04/22/2018 1018   ALKPHOS 84 04/22/2018 1018   BILITOT 0.4 04/22/2018 1018   GFRNONAA >60 08/23/2015 1300   GFRAA >60 08/23/2015 1300    Assessment: 1.  Left lower quadrant pain: Resolved 2.  Diarrhea: Continues occasionally, urgent after eating certain foods; likely IBS  Plan: 1.  Discussed with patient that likely her pain and diarrheal stools are related to IBS.  Especially as she sees a strong diet trigger. 2.  Provided her a copy of the low FODMAP diet.  Did discuss this in detail. 3.  Again explained to patient that she can try Hyoscyamine twice daily initially if she would like to see if this helps with her generalized tenderness today and loose stools.  Patient would like to wait on this. 4.  Ordered celiac studies 5.  Offered patient a follow-up appointment in 8 weeks or so but she would like to call us if she needs anything instead.  Ellouise Newer, PA-C [facility] 06/17/2018, 10:34 AM  Cc: Mackie Pai, PA-C

## 2018-06-17 NOTE — Patient Instructions (Signed)
Your provider has requested that you go to the basement level for lab work before leaving today. Press "B" on the elevator. The lab is located at the first door on the left as you exit the elevator.  We have sent the following medications to your pharmacy for you to pick up at your convenience: Hyoscyamine 0.125 ml SL-- 1 tablet twice daily if you want to try.  Call our office if you need a follow up appointment.  Please follow a low FODMAP diet.  If you are age 46 or older, your body mass index should be between 23-30. Your Body mass index is 38.03 kg/m. If this is out of the aforementioned range listed, please consider follow up with your Primary Care Provider.  If you are age 1 or younger, your body mass index should be between 19-25. Your Body mass index is 38.03 kg/m. If this is out of the aformentioned range listed, please consider follow up with your Primary Care Provider.

## 2018-06-17 NOTE — Progress Notes (Signed)
Reviewed and agree with initial management plan.  Mattisen Pohlmann T. Harveer Sadler, MD FACG 

## 2018-06-18 LAB — TISSUE TRANSGLUTAMINASE, IGA: (TTG) AB, IGA: 1 U/mL

## 2018-08-03 DIAGNOSIS — R1310 Dysphagia, unspecified: Secondary | ICD-10-CM | POA: Diagnosis not present

## 2018-08-03 DIAGNOSIS — Z01419 Encounter for gynecological examination (general) (routine) without abnormal findings: Secondary | ICD-10-CM | POA: Diagnosis not present

## 2018-12-01 ENCOUNTER — Telehealth: Payer: Self-pay | Admitting: Medical

## 2018-12-01 NOTE — Telephone Encounter (Signed)
Pt called requesting a rescue inhaler. Pt was advised she may need an appt to receive this and stated understanding. Please advise.

## 2018-12-02 MED ORDER — ALBUTEROL SULFATE HFA 108 (90 BASE) MCG/ACT IN AERS: INHALATION_SPRAY | RESPIRATORY_TRACT | 3 refills | Status: DC

## 2018-12-02 NOTE — Telephone Encounter (Signed)
I sent in pro air rescue inhaler. But let pt know if having to use inhaler frequently then also go ahead and schedule follow up.

## 2019-01-12 ENCOUNTER — Ambulatory Visit (INDEPENDENT_AMBULATORY_CARE_PROVIDER_SITE_OTHER): Payer: BLUE CROSS/BLUE SHIELD | Admitting: Pulmonary Disease

## 2019-01-12 ENCOUNTER — Other Ambulatory Visit: Payer: Self-pay

## 2019-01-12 ENCOUNTER — Encounter: Payer: Self-pay | Admitting: Pulmonary Disease

## 2019-01-12 ENCOUNTER — Ambulatory Visit (INDEPENDENT_AMBULATORY_CARE_PROVIDER_SITE_OTHER): Payer: BLUE CROSS/BLUE SHIELD

## 2019-01-12 VITALS — BP 130/72 | HR 96 | Temp 98.0°F | Ht 64.0 in | Wt 203.2 lb

## 2019-01-12 DIAGNOSIS — J4520 Mild intermittent asthma, uncomplicated: Secondary | ICD-10-CM | POA: Diagnosis not present

## 2019-01-12 DIAGNOSIS — G4733 Obstructive sleep apnea (adult) (pediatric): Secondary | ICD-10-CM | POA: Diagnosis not present

## 2019-01-12 DIAGNOSIS — J9811 Atelectasis: Secondary | ICD-10-CM

## 2019-01-12 NOTE — Progress Notes (Signed)
   Subjective:    Patient ID: Lorraine Conrad, female    DOB: February 13, 1971, 48 y.o.   MRN: [id number]  HPI    Review of Systems  Constitutional: Negative for fever and unexpected weight change.  HENT: Negative for congestion, dental problem, ear pain, nosebleeds, postnasal drip, rhinorrhea, sinus pressure, sneezing, sore throat and trouble swallowing.   Eyes: Negative for redness and itching.  Respiratory: Positive for wheezing. Negative for cough, chest tightness and shortness of breath.   Cardiovascular: Negative for palpitations and leg swelling.  Gastrointestinal: Negative for nausea and vomiting.  Genitourinary: Negative for dysuria.  Musculoskeletal: Negative for joint swelling.  Skin: Negative for rash.  Allergic/Immunologic: Positive for environmental allergies. Negative for food allergies and immunocompromised state.  Neurological: Negative for headaches.  Hematological: Does not bruise/bleed easily.  Psychiatric/Behavioral: Negative for dysphoric mood. The patient is not nervous/anxious.        Objective:   Physical Exam        Assessment & Plan:

## 2019-01-12 NOTE — Progress Notes (Signed)
[facility]  Chief Complaint  Patient presents with  . 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 female with asthma and obstructive sleep apnea.  Previously seen by Dr. Chase Caller.  She gets cough, wheeze and chest tightness about 1 time per week.  Usually when she goes out in hot weather.  Uses albuterol and then feels better.  Not using Qvar or singulair at this time.  Not having sinus congestion, sore throat, dry mouth, aerophagia, skin rash, or leg swelling.  Able to keep up with activities w/o too much difficulty with breathing.  Gets CPAP supplies from [employer].  Uses nightly.  Getting leak from mask.  Had CT abd/pelvis with GI in 2019.  Showed ATX and pleural thickening (reviewed by me).  Both of these were mild.   Physical Exam:   Appearance - well kempt   ENMT - clear nasal mucosa, midline nasal  septum, no oral exudates, no LAN, trachea midline  Respiratory - normal chest wall, normal respiratory effort, no accessory muscle use, no wheeze/rales  CV - s1s2 regular rate and rhythm, no murmurs, no peripheral edema, radial pulses symmetric  GI - soft, non tender, no masses  Lymph - no adenopathy noted in neck and axillary areas  MSK - normal gait  Ext - no cyanosis, clubbing, or joint inflammation noted  Skin - no rashes, lesions, or ulcers  Neuro - normal strength, oriented x 3  Psych - normal mood and affect   Assessment/Plan:   Mild, intermittent asthma. - prn albuterol - discussed roles for various inhalers, and  environmental control techniques  Obstructive sleep apnea. - she reports compliance with CPAP and benefit from therapy - will get copy of her CPAP download - will arrange for mask refitting  Atelectasis. - seen on CT abd/pelvis in 2019 - will repeat CXR today   Patient Instructions  Chest xray today  Will get copy of your CPAP report  Will arrange for CPAP mask refit  Follow up in 6 months    Chesley Mires, MD [place] Pager: [phone] 01/12/2019, 10:07 AM  Flow Sheet     Pulmonary tests:  Methacholine challenge 12/31/15 >> negative  Sleep tests:  PSG 02/04/18 >> AHI 27.9, SpO2 low 83%  Cardiac tests:  Echo 09/27/15 >> EF 55 to 60%, grade 1 DD, mild MR CPST 01/11/16 >> deconditioning   Medications:   Allergies as of 01/12/2019      Reactions   Promethazine Hcl Other (See Comments)   Violent tremors   Cephalexin Diarrhea, Itching   Codeine Other (See Comments)   Was a child when she took this.   Dilaudid [hydromorphone] Nausea And Vomiting   Soy Allergy Itching   Azithromycin Rash   Head to toe rash      Medication List       Accurate as of January 12, 2019 10:07 AM. If you have any questions, ask your nurse or  doctor.        STOP taking these medications   beclomethasone 40 MCG/ACT inhaler Commonly known as: Qvar Stopped by: Chesley Mires, MD   hyoscyamine 0.125 MG SL tablet Commonly known as: LEVSIN SL Stopped by: Chesley Mires, MD   montelukast 10 MG tablet Commonly known as: SINGULAIR Stopped by: Chesley Mires, MD   oxybutynin 5 MG 24 hr tablet Commonly known as: DITROPAN-XL Stopped by: Chesley Mires, MD   ranitidine 150 MG capsule Commonly known as: ZANTAC Stopped by: Chesley Mires, MD     TAKE these medications   albuterol 108 (90 Base) MCG/ACT inhaler Commonly known as: ProAir HFA USE 2 PUFFS INTO LUNGS EVERY 6 HOURS AS NEEDED FOR WHEEZING OR SHORTNESS OF BREATH   azelastine 0.1 % nasal spray Commonly known as:  Astelin Place 2 sprays into both nostrils 2 (two) times daily. Use in each nostril as directed What changed:   when to take this  reasons to take this   conjugated estrogens vaginal cream Commonly known as: PREMARIN as needed   fluticasone 50 MCG/ACT nasal spray Commonly known as: FLONASE Place 2 sprays into both nostrils daily.   hydrOXYzine 10 MG tablet Commonly known as: ATARAX/VISTARIL Take 1 tablet (10 mg total) by mouth 3 (three) times daily as needed for itching.   loratadine 10 MG tablet Commonly known as: Claritin Take 1 tablet (10 mg total) by mouth daily. What changed:   when to take this  reasons to take this       Past Surgical History:  She  has a past surgical history that includes Cesarean section (2002; 2006); Temporomandibular joint surgery (~ 1992); Nasal septum surgery (~ 1991); Laparoscopic ovarian cystectomy (~ 1999); Upper gastrointestinal endoscopy; Colonoscopy; Abdominal hysterectomy (04/2013); Breast biopsy (Bilateral, 10/05/2000); Oophorectomy (Right, 2015); and Breast excisional biopsy (Left, 1991).  Family History:  Her family history includes Breast cancer in her paternal grandmother; Colon polyps in her father, paternal grandfather, and paternal grandmother; Diverticulitis in her mother; Heart disease in her father; Prostate cancer in her paternal grandfather.  Social History:  She  reports that she has never smoked. She has never used smokeless tobacco. She reports current alcohol use. She reports that she does not use drugs.

## 2019-01-12 NOTE — Patient Instructions (Signed)
Chest xray today  Will get copy of your CPAP report  Will arrange for CPAP mask refit  Follow up in 6 months

## 2019-01-13 ENCOUNTER — Telehealth: Payer: Self-pay | Admitting: Pulmonary Disease

## 2019-01-13 NOTE — Telephone Encounter (Signed)
ATC, NA and no option to leave msg due to VM being full

## 2019-01-13 NOTE — Telephone Encounter (Signed)
Dg Chest 2 View  Result Date: 01/12/2019 CLINICAL DATA:  Atelectasis, history of asthma EXAM: CHEST - 2 VIEW COMPARISON:  12/11/2017 FINDINGS: The heart size and mediastinal contours are within normal limits. Both lungs are clear. The visualized skeletal structures are unremarkable. IMPRESSION: No active cardiopulmonary disease. Electronically Signed   By: Davina Poke M.D.   On: 01/12/2019 15:04     Please let her know that her chest xray was normal.

## 2019-01-14 NOTE — Telephone Encounter (Signed)
ATC, NA and VM box is still full

## 2019-01-17 NOTE — Telephone Encounter (Signed)
Pt notified cxr normal. Nothing further needed.

## 2019-04-05 DIAGNOSIS — M79605 Pain in left leg: Secondary | ICD-10-CM | POA: Diagnosis not present

## 2019-04-05 DIAGNOSIS — K219 Gastro-esophageal reflux disease without esophagitis: Secondary | ICD-10-CM | POA: Diagnosis not present

## 2019-04-05 DIAGNOSIS — Z888 Allergy status to other drugs, medicaments and biological substances status: Secondary | ICD-10-CM | POA: Diagnosis not present

## 2019-04-05 DIAGNOSIS — R2242 Localized swelling, mass and lump, left lower limb: Secondary | ICD-10-CM | POA: Diagnosis not present

## 2019-04-05 DIAGNOSIS — M79662 Pain in left lower leg: Secondary | ICD-10-CM | POA: Diagnosis not present

## 2019-04-05 DIAGNOSIS — M25572 Pain in left ankle and joints of left foot: Secondary | ICD-10-CM | POA: Diagnosis not present

## 2019-08-05 ENCOUNTER — Other Ambulatory Visit: Payer: Self-pay | Admitting: Obstetrics and Gynecology

## 2019-08-05 DIAGNOSIS — Z1231 Encounter for screening mammogram for malignant neoplasm of breast: Secondary | ICD-10-CM

## 2019-08-08 ENCOUNTER — Other Ambulatory Visit: Payer: Self-pay

## 2019-08-08 ENCOUNTER — Ambulatory Visit
Admission: RE | Admit: 2019-08-08 | Discharge: 2019-08-08 | Disposition: A | Discharge status: Home / Self Care | Payer: BC Managed Care – PPO | Source: Ambulatory Visit | Attending: Obstetrics and Gynecology | Admitting: Obstetrics and Gynecology

## 2019-08-08 DIAGNOSIS — Z1231 Encounter for screening mammogram for malignant neoplasm of breast: Secondary | ICD-10-CM | POA: Diagnosis not present

## 2019-08-09 ENCOUNTER — Other Ambulatory Visit: Payer: Self-pay | Admitting: Obstetrics and Gynecology

## 2019-08-09 DIAGNOSIS — R928 Other abnormal and inconclusive findings on diagnostic imaging of breast: Secondary | ICD-10-CM

## 2019-08-14 ENCOUNTER — Ambulatory Visit: Payer: BC Managed Care – PPO | Attending: Internal Medicine

## 2019-08-14 DIAGNOSIS — Z23 Encounter for immunization: Secondary | ICD-10-CM | POA: Insufficient documentation

## 2019-08-14 NOTE — Progress Notes (Signed)
   Covid-19 Vaccination Clinic  Name:  Lorraine Conrad    MRN: [id number] DOB: 31-Aug-1970  08/14/2019  [name] was observed post Covid-19 immunization for 15 minutes without incident. She was provided with Vaccine Information Sheet and instruction to access the V-Safe system.   [name] was instructed to call 911 with any severe reactions post vaccine: Marland Kitchen Difficulty breathing  . Swelling of face and throat  . A fast heartbeat  . A bad rash all over body  . Dizziness and weakness   Immunizations Administered    Name Date Dose VIS Date Route   Pfizer COVID-19 Vaccine 08/14/2019  6:13 PM 0.3 mL 05/20/2019 Intramuscular   Manufacturer: [place]   Lot: [id number]   [place]: [id number]

## 2019-08-24 ENCOUNTER — Other Ambulatory Visit: Payer: Self-pay | Admitting: Medical

## 2019-08-25 ENCOUNTER — Other Ambulatory Visit: Payer: Self-pay | Admitting: Obstetrics and Gynecology

## 2019-08-25 ENCOUNTER — Other Ambulatory Visit: Payer: Self-pay

## 2019-08-25 ENCOUNTER — Ambulatory Visit
Admission: RE | Admit: 2019-08-25 | Discharge: 2019-08-25 | Disposition: A | Discharge status: Home / Self Care | Payer: BC Managed Care – PPO | Source: Ambulatory Visit | Attending: Obstetrics and Gynecology | Admitting: Obstetrics and Gynecology

## 2019-08-25 DIAGNOSIS — R928 Other abnormal and inconclusive findings on diagnostic imaging of breast: Secondary | ICD-10-CM

## 2019-08-25 DIAGNOSIS — R921 Mammographic calcification found on diagnostic imaging of breast: Secondary | ICD-10-CM

## 2019-08-26 ENCOUNTER — Ambulatory Visit
Admission: RE | Admit: 2019-08-26 | Discharge: 2019-08-26 | Disposition: A | Discharge status: Home / Self Care | Payer: BC Managed Care – PPO | Source: Ambulatory Visit | Attending: Obstetrics and Gynecology | Admitting: Obstetrics and Gynecology

## 2019-08-26 DIAGNOSIS — R921 Mammographic calcification found on diagnostic imaging of breast: Secondary | ICD-10-CM | POA: Diagnosis not present

## 2019-08-26 DIAGNOSIS — D241 Benign neoplasm of right breast: Secondary | ICD-10-CM | POA: Diagnosis not present

## 2019-08-26 HISTORY — PX: BREAST BIOPSY: SHX20

## 2019-09-04 ENCOUNTER — Ambulatory Visit: Payer: BC Managed Care – PPO

## 2019-09-14 ENCOUNTER — Ambulatory Visit: Payer: BC Managed Care – PPO | Attending: Internal Medicine

## 2019-09-14 ENCOUNTER — Ambulatory Visit: Payer: BC Managed Care – PPO

## 2019-09-14 DIAGNOSIS — Z23 Encounter for immunization: Secondary | ICD-10-CM

## 2019-09-14 NOTE — Progress Notes (Signed)
   Covid-19 Vaccination Clinic  Name:  Lorraine Conrad    MRN: [id number] DOB: [birth year]  09/14/2019  [name] was observed post Covid-19 immunization for 15 minutes without incident. She was provided with Vaccine Information Sheet and instruction to access the V-Safe system.   [name] was instructed to call 911 with any severe reactions post vaccine: Marland Kitchen Difficulty breathing  . Swelling of face and throat  . A fast heartbeat  . A bad rash all over body  . Dizziness and weakness   Immunizations Administered    Name Date Dose VIS Date Route   Pfizer COVID-19 Vaccine 09/14/2019  1:41 PM 0.3 mL 05/20/2019 Intramuscular   Manufacturer: [place]   Lot: [id number]   [place]: [id number]

## 2019-12-14 ENCOUNTER — Other Ambulatory Visit: Payer: Self-pay | Admitting: Obstetrics and Gynecology

## 2019-12-14 DIAGNOSIS — N644 Mastodynia: Secondary | ICD-10-CM

## 2020-01-17 ENCOUNTER — Ambulatory Visit: Payer: BC Managed Care – PPO

## 2020-01-17 ENCOUNTER — Ambulatory Visit
Admission: RE | Admit: 2020-01-17 | Discharge: 2020-01-17 | Disposition: A | Discharge status: Home / Self Care | Payer: BC Managed Care – PPO | Source: Ambulatory Visit | Attending: Obstetrics and Gynecology | Admitting: Obstetrics and Gynecology

## 2020-01-17 ENCOUNTER — Other Ambulatory Visit: Payer: Self-pay

## 2020-01-17 DIAGNOSIS — R922 Inconclusive mammogram: Secondary | ICD-10-CM | POA: Diagnosis not present

## 2020-01-17 DIAGNOSIS — N644 Mastodynia: Secondary | ICD-10-CM

## 2020-01-19 DIAGNOSIS — D3121 Benign neoplasm of right retina: Secondary | ICD-10-CM | POA: Diagnosis not present

## 2020-04-10 DIAGNOSIS — M79651 Pain in right thigh: Secondary | ICD-10-CM | POA: Diagnosis not present

## 2020-04-10 DIAGNOSIS — M79604 Pain in right leg: Secondary | ICD-10-CM | POA: Diagnosis not present

## 2020-04-10 DIAGNOSIS — M79605 Pain in left leg: Secondary | ICD-10-CM | POA: Diagnosis not present

## 2020-04-10 DIAGNOSIS — M79652 Pain in left thigh: Secondary | ICD-10-CM | POA: Diagnosis not present

## 2020-04-13 ENCOUNTER — Emergency Department (HOSPITAL_BASED_OUTPATIENT_CLINIC_OR_DEPARTMENT_OTHER)
Admission: EM | Admit: 2020-04-13 | Discharge: 2020-04-13 | Disposition: A | Discharge status: Home / Self Care | Payer: BC Managed Care – PPO | Attending: Emergency Medicine | Admitting: Emergency Medicine

## 2020-04-13 ENCOUNTER — Encounter (HOSPITAL_BASED_OUTPATIENT_CLINIC_OR_DEPARTMENT_OTHER): Payer: Self-pay

## 2020-04-13 ENCOUNTER — Other Ambulatory Visit: Payer: Self-pay

## 2020-04-13 ENCOUNTER — Emergency Department (HOSPITAL_BASED_OUTPATIENT_CLINIC_OR_DEPARTMENT_OTHER): Payer: BC Managed Care – PPO

## 2020-04-13 DIAGNOSIS — Z7951 Long term (current) use of inhaled steroids: Secondary | ICD-10-CM | POA: Diagnosis not present

## 2020-04-13 DIAGNOSIS — M79661 Pain in right lower leg: Secondary | ICD-10-CM

## 2020-04-13 DIAGNOSIS — J45909 Unspecified asthma, uncomplicated: Secondary | ICD-10-CM | POA: Diagnosis not present

## 2020-04-13 DIAGNOSIS — M79604 Pain in right leg: Secondary | ICD-10-CM | POA: Diagnosis not present

## 2020-04-13 MED ORDER — KETOROLAC TROMETHAMINE 60 MG/2ML IM SOLN
60.0000 mg | Freq: Once | INTRAMUSCULAR | Status: AC
  Administered 2020-04-13: 60 mg via INTRAMUSCULAR
  Filled 2020-04-13: qty 2

## 2020-04-13 MED ORDER — METHOCARBAMOL 500 MG PO TABS
500.0000 mg | ORAL_TABLET | Freq: Two times a day (BID) | ORAL | 0 refills | Status: DC
Start: 2020-04-13 — End: 2020-06-22

## 2020-04-13 MED ORDER — NAPROXEN 500 MG PO TABS
500.0000 mg | ORAL_TABLET | Freq: Two times a day (BID) | ORAL | 0 refills | Status: DC
Start: 2020-04-13 — End: 2020-05-08

## 2020-04-13 NOTE — ED Triage Notes (Signed)
Pt c/o "left calf pain" x 1+week-denies injury-NAD-steady gait

## 2020-04-13 NOTE — ED Provider Notes (Signed)
[facility] EMERGENCY DEPARTMENT Provider Note   CSN: [id number] Arrival date & time: 04/13/20  1509     History Chief Complaint  Patient presents with  . Leg Pain    Lorraine Conrad is a 49 y.o. female who presents for evaluation of right lower extremity pain x 1 week.  She reports a week ago, she started noticing pain to the posterior aspect of her right calf.  She states that over the last week, it is gotten progressively worse.  She states that it is worse when she ambulates and bears weight but is also worse when she is just sitting there.  She did not have any preceding trauma, injury.  She has not noticed any overlying warmth, erythema.  She she is also notices worse like when she will move her foot up and down that seems to make the pain more pronounced in her calf. She denies any OCP use, recent immobilization, prior history of DVT/PE, recent surgery, leg swelling, or long travel. She denies any fevers, numbness/weakness.   The history is provided by the patient.       Past Medical History:  Diagnosis Date  . Allergy    SEASONAL  . Anemia   . Arthritis    "spine" (03/01/2014)  . Asthma   . Chronic lower back pain   . Endometriosis   . Erosive gastritis   . External hemorrhoids   . GERD (gastroesophageal reflux disease)   . H/O shortness of breath 2014   Cardiopulmonary exercise test results  . Headache    "probably monthly" (03/01/2014)  . Migraine 1998; 02/2014   "this one's lasted 10 days straight" (03/01/2014)  . OSA on CPAP   . Ovarian cyst, left   . Proctalgia fugax   . Sleep apnea    on cpap  . Thyroid disease    thyroid nodule    Patient Active Problem List   Diagnosis Date Noted  . Dysphasia 10/12/2017  . Globus pharyngeus 10/12/2017  . Paresthesias 04/14/2017  . Cervical radiculopathy 12/29/2016  . Lumbar radiculopathy 12/29/2016  . Diastolic dysfunction 22/29/7989  . Mass of ovary 07/12/2015  . History of laparoscopy 06/19/2015  .  Left hip pain 05/24/2015  . Left knee pain 05/24/2015  . Left ankle pain 05/24/2015  . Chest wall pain 05/16/2015  . OSA (obstructive sleep apnea) 03/01/2015  . Leukocytosis 03/01/2015  . Leg weakness, bilateral 03/01/2015  . B12 deficiency 03/01/2015  . Allergic rhinitis 12/12/2014  . Dyspnea 12/12/2014  . SOB (shortness of breath) 10/30/2014  . Migraine 07/10/2014  . Hemorrhoid 07/10/2014  . H/O: hysterectomy 07/05/2014  . Arthralgia 05/08/2014  . Tachycardia 03/29/2014  . Tinnitus 03/27/2014  . Fatigue 03/27/2014  . Back pain 03/27/2014  . Shingles 03/09/2014  . Fever 03/09/2014  . Hot flashes 03/09/2014  . Meningitis, viral 03/02/2014  . Headache 03/01/2014  . Myalgia and myositis 02/23/2014  . Headache(784.0) 02/23/2014  . Endometriosis 02/04/2014  . Routine general medical examination at a health care facility 01/16/2014  . Psoriasis 01/16/2014  . Obesity (BMI 35.0-39.9 without comorbidity) 01/16/2014  . Neck pain 01/01/2014  . IC (interstitial cystitis) 11/14/2013  . Dysuria 10/11/2013  . Abnormal CBC 10/11/2013  . Watery eyes 10/11/2013  . Plantar fasciitis of left foot 03/18/2013  . IBS (irritable bowel syndrome) 03/18/2013  . Nausea alone 02/17/2013  . PLEURISY 11/10/2008  . GASTRITIS 08/10/2008  . ANXIETY 01/06/2008  . PROCTALGIA FUGAX 01/06/2008  . ABDOMINAL PAIN-MULTIPLE SITES 01/06/2008  .  GERD 11/15/2007  . CONSTIPATION 11/15/2007  . Chest pain 11/15/2007  . OVARIAN CYST, LEFT 11/11/2007    Past Surgical History:  Procedure Laterality Date  . ABDOMINAL HYSTERECTOMY  04/2013  . BREAST BIOPSY Bilateral 10/05/2000  . BREAST EXCISIONAL BIOPSY Left 1991  . CESAREAN SECTION  2002; 2006  . COLONOSCOPY    . LAPAROSCOPIC OVARIAN CYSTECTOMY  ~ 1999  . NASAL SEPTUM SURGERY  ~ 1991  . OOPHORECTOMY Right 2015  . TEMPOROMANDIBULAR JOINT SURGERY  ~ 86  . UPPER GASTROINTESTINAL ENDOSCOPY       OB History   No obstetric history on file.     Family  History  Problem Relation Age of Onset  . Prostate cancer Paternal Grandfather   . Colon polyps Paternal Grandfather   . Colon polyps Father   . Heart disease Father   . Diverticulitis Mother   . Colon polyps Paternal Grandmother   . Breast cancer Paternal Grandmother   . Colon cancer Neg Hx   . Esophageal cancer Neg Hx   . Rectal cancer Neg Hx   . Stomach cancer Neg Hx   . Pancreatic cancer Neg Hx     Social History   Tobacco Use  . Smoking status: Never Smoker  . Smokeless tobacco: Never Used  . Tobacco comment: father smoked in house growing up per pt.   Vaping Use  . Vaping Use: Never used  Substance Use Topics  . Alcohol use: Yes    Alcohol/week: 0.0 standard drinks    Comment: 12-23-16 "I'll have 1-2 drinks maybe 3-4 times/yr"  . Drug use: No    Home Medications Prior to Admission medications   Medication Sig Start Date End Date Taking? Authorizing Provider  albuterol (PROAIR HFA) 108 (90 Base) MCG/ACT inhaler USE 2 PUFFS INTO LUNGS EVERY 6 HOURS AS NEEDED FOR WHEEZING OR SHORTNESS OF BREATH 12/02/18   Saguier, Percell Miller, PA-C  azelastine (ASTELIN) 0.1 % nasal spray Place 2 sprays into both nostrils 2 (two) times daily. Use in each nostril as directed Patient taking differently: Place 2 sprays into both nostrils as needed. Use in each nostril as directed 03/12/16   Saguier, Percell Miller, PA-C  conjugated estrogens (PREMARIN) vaginal cream as needed 01/28/16   [provider]  fluticasone (FLONASE) 50 MCG/ACT nasal spray Place 2 sprays into both nostrils daily. 05/14/18   Saguier, Percell Miller, PA-C  hydrOXYzine (ATARAX/VISTARIL) 10 MG tablet Take 1 tablet (10 mg total) by mouth 3 (three) times daily as needed for itching. 03/12/16   Saguier, Percell Miller, PA-C  loratadine (CLARITIN) 10 MG tablet Take 1 tablet (10 mg total) by mouth daily. Patient taking differently: Take 10 mg by mouth daily as needed.  04/24/16   Saguier, Percell Miller, PA-C  methocarbamol (ROBAXIN) 500 MG tablet Take 1  tablet (500 mg total) by mouth 2 (two) times daily. 04/13/20   Volanda Napoleon, PA-C  naproxen (NAPROSYN) 500 MG tablet Take 1 tablet (500 mg total) by mouth 2 (two) times daily. 04/13/20   Volanda Napoleon, PA-C    Allergies    Promethazine hcl, Cephalexin, Codeine, Dilaudid [hydromorphone], Soy allergy, and Azithromycin  Review of Systems   Review of Systems  Constitutional: Negative for fever.  Musculoskeletal:       Leg pain  Neurological: Negative for weakness and numbness.  All other systems reviewed and are negative.   Physical Exam Updated Vital Signs BP 131/80   Pulse 89   Temp 98.5 F (36.9 C) (Oral)   Resp 16  Ht 5\' 4"  (1.626 m)   Wt 98 kg   LMP 02/08/2013   SpO2 100%   BMI 37.08 kg/m   Physical Exam Vitals and nursing note reviewed.  Constitutional:      Appearance: She is well-developed.  HENT:     Head: Normocephalic and atraumatic.  Eyes:     General: No scleral icterus.       Right eye: No discharge.        Left eye: No discharge.     Conjunctiva/sclera: Conjunctivae normal.  Cardiovascular:     Pulses:          Dorsalis pedis pulses are 2+ on the right side and 2+ on the left side.  Pulmonary:     Effort: Pulmonary effort is normal.  Musculoskeletal:     Comments: Tenderness palpation noted to the right calf.  No overlying soft tissue swelling, warmth, erythema.  No deformity or crepitus noted.  She can flex and extend all 5 digits of her right foot with any difficulty.  Dorsiflexion and plantarflexion intact but she does report that that makes her pain in her calf worse.  Skin:    General: Skin is warm and dry.     Comments: Good distal cap refill. RLE is not dusky in appearance or cool to touch.   Neurological:     Mental Status: She is alert.     Comments: Sensation intact along major nerve distributions of RLE  Psychiatric:        Speech: Speech normal.        Behavior: Behavior normal.     ED Results / Procedures / Treatments    Labs (all labs ordered are listed, but only abnormal results are displayed) Labs Reviewed - No data to display  EKG None  Radiology DG Tibia/Fibula Right  Result Date: 04/13/2020 CLINICAL DATA:  Leg pain, cramping and pain in right calf. Denies injury. EXAM: RIGHT TIBIA AND FIBULA - 2 VIEW COMPARISON:  None. FINDINGS: There is no evidence of fracture or other focal bone lesions. Plantar calcaneal spur. Soft tissues are unremarkable. IMPRESSION: No acute displaced fracture or dislocation. Electronically Signed   By: Iven Finn M.D.   On: 04/13/2020 17:28   US Venous Img Lower Right (DVT Study)  Result Date: 04/13/2020 CLINICAL DATA:  Right leg pain. EXAM: Right LOWER EXTREMITY VENOUS DOPPLER ULTRASOUND TECHNIQUE: Gray-scale sonography with compression, as well as color and duplex ultrasound, were performed to evaluate the deep venous system(s) from the level of the common femoral vein through the popliteal and proximal calf veins. COMPARISON:  None. FINDINGS: VENOUS Normal compressibility of the common femoral, superficial femoral, and popliteal veins, as well as the visualized calf veins. Visualized portions of profunda femoral vein and great saphenous vein unremarkable. No filling defects to suggest DVT on grayscale or color Doppler imaging. Doppler waveforms show normal direction of venous flow, normal respiratory plasticity and response to augmentation. Limited views of the contralateral common femoral vein are unremarkable. OTHER None. Limitations: none IMPRESSION: Negative. Electronically Signed   By: Iven Finn M.D.   On: 04/13/2020 16:34    Procedures Procedures (including critical care time)  Medications Ordered in ED Medications  ketorolac (TORADOL) injection 60 mg (60 mg Intramuscular Given 04/13/20 1754)    ED Course  I have reviewed the triage vital signs and the nursing notes.  Pertinent labs & imaging results that were available during my care of the patient were  reviewed by me and considered in my  medical decision making (see chart for details).    MDM Rules/Calculators/A&P                          49 year old female who presents for evaluation of right leg pain x1 week.  No preceding trauma, injury.  She has been able to ambulate but does report worsening pain with ambulation.  No warmth, swelling, fevers, numbness/weakness.  On initial arrival, she is afebrile, toxic appearing.  Vital signs are stable.  On exam, she has tenderness noted to the right calf.  No overlying warmth, erythema.  Question if this is DVT versus musculoskeletal etiology versus popliteal cyst.  History/physical exam not concerning for infectious process or ischemic limb.  Plan to check x-ray, ultrasound.  X-ray negative for any acute bony abnormality.  Ultrasound shows no evidence of Baker's cyst, DVT.  I discussed results with patient.  I discussed with her that this could be musculoskeletal in nature versus tendinitis.  We will plan to treat with muscle relaxers and naproxen.  Patient is requesting Toradol shot here in the ED.  Encouraged at home supportive care measures.  Will give outpatient Ortho referral if her symptoms do not improve. At this time, patient exhibits no emergent life-threatening condition that require further evaluation in ED. Patient had ample opportunity for questions and discussion. All patient's questions were answered with full understanding. Strict return precautions discussed. Patient expresses understanding and agreement to plan.   Portions of this note were generated with Lobbyist. Dictation errors may occur despite best attempts at proofreading.   Final Clinical Impression(s) / ED Diagnoses Final diagnoses:  Right calf pain    Rx / DC Orders ED Discharge Orders         Ordered    naproxen (NAPROSYN) 500 MG tablet  2 times daily        04/13/20 1756    methocarbamol (ROBAXIN) 500 MG tablet  2 times daily        04/13/20 1756            Desma Mcgregor 04/13/20 2229    Malvin Johns, MD 04/13/20 2316

## 2020-04-13 NOTE — ED Notes (Signed)
Patient transported to X-ray 

## 2020-04-13 NOTE — Discharge Instructions (Signed)
Take Naproxen as directed.   Take Robaxin as prescribed. This medication will make you drowsy so do not drive or drink alcohol when taking it.  Follow the RICE (Rest, Ice, Compression, Elevation) protocol as directed.   If you don't have any improvement in your symptoms, follow up with the referred orthopedic doctor.   Return to the Emergency Dept for any worsening pain, numbness/weakness, discoloration or any other worsening or concerning symptoims.

## 2020-04-26 ENCOUNTER — Telehealth: Payer: Self-pay | Admitting: Family Medicine

## 2020-04-26 NOTE — Telephone Encounter (Signed)
Called both pt's phone#s to offer ED f/u appt w/ Dr.Schmitz--no answer, unable to leave message.  glh

## 2020-05-08 ENCOUNTER — Other Ambulatory Visit: Payer: Self-pay

## 2020-05-08 ENCOUNTER — Ambulatory Visit (INDEPENDENT_AMBULATORY_CARE_PROVIDER_SITE_OTHER): Payer: BC Managed Care – PPO

## 2020-05-08 ENCOUNTER — Ambulatory Visit (INDEPENDENT_AMBULATORY_CARE_PROVIDER_SITE_OTHER): Payer: BC Managed Care – PPO | Admitting: Medical

## 2020-05-08 VITALS — BP 138/78 | HR 90 | Temp 97.9°F | Resp 18 | Ht 64.0 in | Wt 221.0 lb

## 2020-05-08 DIAGNOSIS — M545 Low back pain, unspecified: Secondary | ICD-10-CM

## 2020-05-08 DIAGNOSIS — R600 Localized edema: Secondary | ICD-10-CM | POA: Diagnosis not present

## 2020-05-08 DIAGNOSIS — M79662 Pain in left lower leg: Secondary | ICD-10-CM | POA: Diagnosis not present

## 2020-05-08 DIAGNOSIS — G8929 Other chronic pain: Secondary | ICD-10-CM | POA: Diagnosis not present

## 2020-05-08 DIAGNOSIS — M79605 Pain in left leg: Secondary | ICD-10-CM | POA: Diagnosis not present

## 2020-05-08 MED ORDER — METHYLPREDNISOLONE 4 MG PO TABS: ORAL_TABLET | ORAL | 0 refills | Status: DC

## 2020-05-08 MED ORDER — KETOROLAC TROMETHAMINE 60 MG/2ML IM SOLN
60.0000 mg | Freq: Once | INTRAMUSCULAR | Status: AC
  Administered 2020-05-08: 60 mg via INTRAMUSCULAR

## 2020-05-08 NOTE — Addendum Note (Signed)
Addended by: Jeronimo Greaves on: 05/08/2020 02:07 PM   Modules accepted: Orders

## 2020-05-08 NOTE — Progress Notes (Signed)
Subjective:    Patient ID: Lorraine Conrad, female    DOB: [birth year], 49 y.o.   MRN: [id number]  HPI  Pt on Friday woke up with medial distal calf pain left side and some proximal calf and behind her knee. Pt states she stood few hours cooking on Thursday thanskgiving. Mild walking on Thursday. No hx of dvt.  Some mid lower back pain as well yesterday. No radiating pain to her leg. Hx of chronic back pain since 2016 with intermittent flares. Before this most recent event had been mabye one year.    Prior mri in 2016. IMPRESSION: 1. Minimal degenerative annular disc bulge at L4-5 and L5-S1 without significant stenosis. 2. Mild bilateral facet arthrosis at L4-5. 3. Otherwise normal MRI of the lumbar spine. No significant canal or foraminal stenosis. No neural impingement. 4. Complex cystic left ovarian lesion, incompletely evaluated on this exam. Further evaluation with dedicated pelvic ultrasound could be performed for further assessment as clinically desired.   Pt tried robaxin for her back pain. Pt also has alleve.   Review of Systems  Constitutional: Negative for chills, fatigue and fever.  Eyes: Negative for pain and redness.  Respiratory: Negative for cough, choking, shortness of breath and wheezing.   Cardiovascular: Negative for chest pain and palpitations.  Gastrointestinal: Negative for abdominal pain.  Musculoskeletal: Positive for back pain.       Calf pain left side.  Skin: Negative for rash.  Hematological: Negative for adenopathy. Does not bruise/bleed easily.  Psychiatric/Behavioral: Negative for behavioral problems and confusion.    Past Medical History:  Diagnosis Date   Allergy    SEASONAL   Anemia    Arthritis    "spine" (03/01/2014)   Asthma    Chronic lower back pain    Endometriosis    Erosive gastritis    External hemorrhoids    GERD (gastroesophageal reflux disease)    H/O shortness of breath 2014   Cardiopulmonary exercise test  results   Headache    "probably monthly" (03/01/2014)   Migraine 1998; 02/2014   "this one's lasted 10 days straight" (03/01/2014)   OSA on CPAP    Ovarian cyst, left    Proctalgia fugax    Sleep apnea    on cpap   Thyroid disease    thyroid nodule     Social History   Socioeconomic History   Marital status: Married    Spouse name: Not on file   Number of children: 2   Years of education: Not on file   Highest education level: Not on file  Occupational History   Occupation: homemaker  Tobacco Use   Smoking status: Never Smoker   Smokeless tobacco: Never Used   Tobacco comment: father smoked in house growing up per pt.   Vaping Use   Vaping Use: Never used  Substance and Sexual Activity   Alcohol use: Yes    Alcohol/week: 0.0 standard drinks    Comment: 12-23-16 "I'll have 1-2 drinks maybe 3-4 times/yr"   Drug use: No   Sexual activity: Yes    Birth control/protection: Surgical  Other Topics Concern   Not on file  Social History Narrative   Daily caffiene use: 6 cups per day   Patient does not get regular excercise   Social Determinants of Health   Financial Resource Strain:    Difficulty of Paying Living Expenses: Not on file  Food Insecurity:    Worried About [place] in the Last Year:  Not on file   Ran Out of Food in the Last Year: Not on file  Transportation Needs:    Lack of Transportation (Medical): Not on file   Lack of Transportation (Non-Medical): Not on file  Physical Activity:    Days of Exercise per Week: Not on file   Minutes of Exercise per Session: Not on file  Stress:    Feeling of Stress : Not on file  Social Connections:    Frequency of Communication with Friends and Family: Not on file   Frequency of Social Gatherings with Friends and Family: Not on file   Attends Religious Services: Not on file   Active Member of Clubs or Organizations: Not on file   Attends Archivist Meetings: Not on  file   Marital Status: Not on file  Intimate Partner Violence:    Fear of Current or Ex-Partner: Not on file   Emotionally Abused: Not on file   Physically Abused: Not on file   Sexually Abused: Not on file    Past Surgical History:  Procedure Laterality Date   ABDOMINAL HYSTERECTOMY  04/2013   BREAST BIOPSY Bilateral 10/05/2000   BREAST EXCISIONAL BIOPSY Left 1991   CESAREAN SECTION  2002; 2006   COLONOSCOPY     LAPAROSCOPIC OVARIAN CYSTECTOMY  ~ [place]  ~ [place] Right 2015   TEMPOROMANDIBULAR JOINT SURGERY  ~ [place] ENDOSCOPY      Family History  Problem Relation Age of Onset   Prostate cancer Paternal Grandfather    Colon polyps Paternal Grandfather    Colon polyps Father    Heart disease Father    Diverticulitis Mother    Colon polyps Paternal Grandmother    Breast cancer Paternal Grandmother    Colon cancer Neg Hx    Esophageal cancer Neg Hx    Rectal cancer Neg Hx    Stomach cancer Neg Hx    Pancreatic cancer Neg Hx     Allergies  Allergen Reactions   Promethazine Hcl Other (See Comments)    Violent tremors    Cephalexin Diarrhea and Itching   Codeine Other (See Comments)    Was a child when she took this.   Dilaudid [Hydromorphone] Nausea And Vomiting   Soy Allergy Itching   Azithromycin Rash    Head to toe rash    Current Outpatient Medications on File Prior to Visit  Medication Sig Dispense Refill   albuterol (PROAIR HFA) 108 (90 Base) MCG/ACT inhaler USE 2 PUFFS INTO LUNGS EVERY 6 HOURS AS NEEDED FOR WHEEZING OR SHORTNESS OF BREATH 18 g 3   conjugated estrogens (PREMARIN) vaginal cream as needed     fluticasone (FLONASE) 50 MCG/ACT nasal spray Place 2 sprays into both nostrils daily. 16 g 1   hydrOXYzine (ATARAX/VISTARIL) 10 MG tablet Take 1 tablet (10 mg total) by mouth 3 (three) times daily as needed for itching. 30 tablet 0   methocarbamol (ROBAXIN)  500 MG tablet Take 1 tablet (500 mg total) by mouth 2 (two) times daily. 20 tablet 0   naproxen (NAPROSYN) 500 MG tablet Take 1 tablet (500 mg total) by mouth 2 (two) times daily. 20 tablet 0   azelastine (ASTELIN) 0.1 % nasal spray Place 2 sprays into both nostrils 2 (two) times daily. Use in each nostril as directed (Patient not taking: Reported on 05/08/2020) 30 mL 3   loratadine (CLARITIN) 10 MG tablet Take 1 tablet (10 mg total)  by mouth daily. (Patient not taking: Reported on 05/08/2020) 30 tablet 0   No current facility-administered medications on file prior to visit.    BP 138/78    Pulse 90    Temp 97.9 F (36.6 C) (Oral)    Resp 18    Ht 5\' 4"  (1.626 m)    Wt 221 lb (100.2 kg)    LMP 02/08/2013    SpO2 99%    BMI 37.93 kg/m       Objective:   Physical Exam  General Appearance- Not in acute distress.    Chest and Lung Exam Auscultation: Breath sounds:-Normal. Clear even and unlabored. Adventitious sounds:- No Adventitious sounds.  Cardiovascular Auscultation:Rythm - Regular, rate and rythm. Heart Sounds -Normal heart sounds.  Abdomen Inspection:-Inspection Normal.  Palpation/Perucssion: Palpation and Percussion of the abdomen reveal- Non Tender, No Rebound tenderness, No rigidity(Guarding) and No Palpable abdominal masses.  Liver:-Normal.  Spleen:- Normal.   Back Mid lumbar spine tenderness to palpation. Pain on straight leg lift. Pain on lateral movements and flexion/extension of the spine.  Lower ext neurologic  L5-S1 sensation intact bilaterally. Normal patellar reflexes bilaterally. No foot drop bilaterally.  Left lower ext- mild medial aspect calf tenderness. Mild pain lateral aspect.       Assessment & Plan:  Low back pain likely muscular but known disc disease on pior mri so if pain worsens or changes notify use.  Can continue robaxin and prescribe short taper medrol 6 taper dose.  Give toradol 60 mg im.  Will get left lower ext [place] to make  sure no dvt. If negative then probable calf muscle strain. If positive for dvt then treat.   Follow up in 10 days or as needed

## 2020-05-08 NOTE — Addendum Note (Signed)
Addended by: Anabel Halon on: 05/08/2020 01:54 PM   Modules accepted: Orders

## 2020-05-08 NOTE — Patient Instructions (Addendum)
Low back pain likely muscular but known disc disease on pior mri so if pain worsens or changes notify use.  Can continue robaxin and prescribe short taper medrol 6 taper dose.  Give toradol 60 mg im.  Will get left lower ext [place] to make sure no dvt. If negative then probable calf muscle strain. If positive for dvt then treat.   Follow up in 10 days or as needed  Go ahead schedule cpe/wellness exam.

## 2020-05-10 ENCOUNTER — Encounter: Payer: Self-pay | Admitting: Medical

## 2020-05-11 ENCOUNTER — Ambulatory Visit: Payer: BC Managed Care – PPO

## 2020-05-14 ENCOUNTER — Telehealth: Payer: Self-pay | Admitting: Medical

## 2020-05-14 MED ORDER — HYDROCODONE-ACETAMINOPHEN 5-325 MG PO TABS: 1.0000 | ORAL_TABLET | Freq: Four times a day (QID) | ORAL | 0 refills | Status: DC | PRN

## 2020-05-14 NOTE — Telephone Encounter (Signed)
Rx norco sent to pt pharmacy.

## 2020-05-15 ENCOUNTER — Telehealth: Payer: Self-pay | Admitting: Medical

## 2020-05-15 ENCOUNTER — Ambulatory Visit: Payer: BC Managed Care – PPO | Admitting: Medical

## 2020-05-15 NOTE — Telephone Encounter (Signed)
Can you get pt scheduled for depomedrol 80 mg im injection for her back pain. Nurse visit. For Thursday or Friday.

## 2020-05-16 ENCOUNTER — Ambulatory Visit (INDEPENDENT_AMBULATORY_CARE_PROVIDER_SITE_OTHER): Payer: BC Managed Care – PPO

## 2020-05-16 ENCOUNTER — Other Ambulatory Visit: Payer: Self-pay

## 2020-05-16 DIAGNOSIS — R293 Abnormal posture: Secondary | ICD-10-CM | POA: Diagnosis not present

## 2020-05-16 DIAGNOSIS — M545 Low back pain, unspecified: Secondary | ICD-10-CM

## 2020-05-16 DIAGNOSIS — M5489 Other dorsalgia: Secondary | ICD-10-CM | POA: Diagnosis not present

## 2020-05-16 DIAGNOSIS — G8929 Other chronic pain: Secondary | ICD-10-CM

## 2020-05-16 DIAGNOSIS — M79662 Pain in left lower leg: Secondary | ICD-10-CM

## 2020-05-16 DIAGNOSIS — M7918 Myalgia, other site: Secondary | ICD-10-CM | POA: Diagnosis not present

## 2020-05-16 DIAGNOSIS — M6281 Muscle weakness (generalized): Secondary | ICD-10-CM | POA: Diagnosis not present

## 2020-05-16 MED ORDER — METHYLPREDNISOLONE ACETATE 80 MG/ML IJ SUSP
80.0000 mg | Freq: Once | INTRAMUSCULAR | Status: AC
  Administered 2020-05-16: 80 mg via INTRAMUSCULAR

## 2020-05-16 NOTE — Progress Notes (Addendum)
Pt received Im injection of Depo Medrol  80 mg for chronic lower back pain. Tolerated injection well. No other concerns  I did order depo medrol 80 mg im.  Staff member who gave injections incorrectly documented toradol. That staff member is no longer with [place] so I am documenting stating she documented in error per Maudie Mercury who is Librarian, academic.   Mackie Pai, PA-C

## 2020-05-16 NOTE — Telephone Encounter (Signed)
Appt made

## 2020-05-17 ENCOUNTER — Ambulatory Visit: Payer: BC Managed Care – PPO

## 2020-05-18 DIAGNOSIS — M5489 Other dorsalgia: Secondary | ICD-10-CM | POA: Diagnosis not present

## 2020-05-18 DIAGNOSIS — R293 Abnormal posture: Secondary | ICD-10-CM | POA: Diagnosis not present

## 2020-05-18 DIAGNOSIS — M7918 Myalgia, other site: Secondary | ICD-10-CM | POA: Diagnosis not present

## 2020-05-18 DIAGNOSIS — M6281 Muscle weakness (generalized): Secondary | ICD-10-CM | POA: Diagnosis not present

## 2020-05-22 ENCOUNTER — Ambulatory Visit: Payer: BC Managed Care – PPO | Admitting: Medical

## 2020-05-22 DIAGNOSIS — M7918 Myalgia, other site: Secondary | ICD-10-CM | POA: Diagnosis not present

## 2020-05-22 DIAGNOSIS — M6281 Muscle weakness (generalized): Secondary | ICD-10-CM | POA: Diagnosis not present

## 2020-05-22 DIAGNOSIS — R293 Abnormal posture: Secondary | ICD-10-CM | POA: Diagnosis not present

## 2020-05-22 DIAGNOSIS — M5489 Other dorsalgia: Secondary | ICD-10-CM | POA: Diagnosis not present

## 2020-05-24 DIAGNOSIS — M7918 Myalgia, other site: Secondary | ICD-10-CM | POA: Diagnosis not present

## 2020-05-24 DIAGNOSIS — M6281 Muscle weakness (generalized): Secondary | ICD-10-CM | POA: Diagnosis not present

## 2020-05-24 DIAGNOSIS — R293 Abnormal posture: Secondary | ICD-10-CM | POA: Diagnosis not present

## 2020-05-24 DIAGNOSIS — M5489 Other dorsalgia: Secondary | ICD-10-CM | POA: Diagnosis not present

## 2020-06-04 ENCOUNTER — Ambulatory Visit: Payer: BC Managed Care – PPO | Attending: Internal Medicine

## 2020-06-04 DIAGNOSIS — Z23 Encounter for immunization: Secondary | ICD-10-CM

## 2020-06-04 NOTE — Progress Notes (Signed)
   Covid-19 Vaccination Clinic  Name:  RANETTA ARMACOST    MRN: [id number] DOB: [birth year]  06/04/2020  Ms. Melendrez was observed post Covid-19 immunization for 15 minutes without incident. She was provided with Vaccine Information Sheet and instruction to access the V-Safe system.   Ms. Fromer was instructed to call 911 with any severe reactions post vaccine: Marland Kitchen Difficulty breathing  . Swelling of face and throat  . A fast heartbeat  . A bad rash all over body  . Dizziness and weakness   Immunizations Administered    Name Date Dose VIS Date Route   Pfizer COVID-19 Vaccine 06/04/2020  2:00 PM 0.3 mL 03/28/2020 Intramuscular   Manufacturer: [employer], [employer]   Lot: [id number]   NDC: [address]-5

## 2020-06-13 DIAGNOSIS — M79652 Pain in left thigh: Secondary | ICD-10-CM | POA: Diagnosis not present

## 2020-06-13 DIAGNOSIS — M79651 Pain in right thigh: Secondary | ICD-10-CM | POA: Diagnosis not present

## 2020-06-13 DIAGNOSIS — M79605 Pain in left leg: Secondary | ICD-10-CM | POA: Diagnosis not present

## 2020-06-13 DIAGNOSIS — M79604 Pain in right leg: Secondary | ICD-10-CM | POA: Diagnosis not present

## 2020-06-14 DIAGNOSIS — M79604 Pain in right leg: Secondary | ICD-10-CM | POA: Diagnosis not present

## 2020-06-14 DIAGNOSIS — M79605 Pain in left leg: Secondary | ICD-10-CM | POA: Diagnosis not present

## 2020-06-14 DIAGNOSIS — M79652 Pain in left thigh: Secondary | ICD-10-CM | POA: Diagnosis not present

## 2020-06-14 DIAGNOSIS — M79651 Pain in right thigh: Secondary | ICD-10-CM | POA: Diagnosis not present

## 2020-06-15 ENCOUNTER — Other Ambulatory Visit: Payer: Self-pay

## 2020-06-15 ENCOUNTER — Encounter: Payer: Self-pay | Admitting: Medical

## 2020-06-15 ENCOUNTER — Ambulatory Visit (INDEPENDENT_AMBULATORY_CARE_PROVIDER_SITE_OTHER): Payer: BC Managed Care – PPO | Admitting: Medical

## 2020-06-15 VITALS — BP 112/74 | HR 104 | Temp 98.2°F | Resp 16 | Ht 64.0 in | Wt 219.0 lb

## 2020-06-15 DIAGNOSIS — Z0000 Encounter for general adult medical examination without abnormal findings: Secondary | ICD-10-CM

## 2020-06-15 LAB — CBC WITH DIFFERENTIAL/PLATELET
Basophils Absolute: 0 10*3/uL (ref 0.0–0.1)
Basophils Relative: 0.5 % (ref 0.0–3.0)
Eosinophils Absolute: 0.1 10*3/uL (ref 0.0–0.7)
Eosinophils Relative: 1.4 % (ref 0.0–5.0)
HCT: 41.1 % (ref 36.0–46.0)
Hemoglobin: 13.9 g/dL (ref 12.0–15.0)
Lymphocytes Relative: 20.3 % (ref 12.0–46.0)
Lymphs Abs: 1.7 10*3/uL (ref 0.7–4.0)
MCHC: 33.7 g/dL (ref 30.0–36.0)
MCV: 84.9 fl (ref 78.0–100.0)
Monocytes Absolute: 0.5 10*3/uL (ref 0.1–1.0)
Monocytes Relative: 6.4 % (ref 3.0–12.0)
Neutro Abs: 6 10*3/uL (ref 1.4–7.7)
Neutrophils Relative %: 71.4 % (ref 43.0–77.0)
Platelets: 424 10*3/uL — ABNORMAL HIGH (ref 150.0–400.0)
RBC: 4.85 Mil/uL (ref 3.87–5.11)
RDW: 13.5 % (ref 11.5–15.5)
WBC: 8.4 10*3/uL (ref 4.0–10.5)

## 2020-06-15 LAB — COMPREHENSIVE METABOLIC PANEL
ALT: 12 U/L (ref 0–35)
AST: 14 U/L (ref 0–37)
Albumin: 4.5 g/dL (ref 3.5–5.2)
Alkaline Phosphatase: 100 U/L (ref 39–117)
BUN: 20 mg/dL (ref 6–23)
CO2: 29 mEq/L (ref 19–32)
Calcium: 9.7 mg/dL (ref 8.4–10.5)
Chloride: 101 mEq/L (ref 96–112)
Creatinine, Ser: 0.95 mg/dL (ref 0.40–1.20)
GFR: 71.15 mL/min (ref >60.00)
Glucose, Bld: 82 mg/dL (ref 70–99)
Potassium: 4.7 mEq/L (ref 3.5–5.1)
Sodium: 137 mEq/L (ref 135–145)
Total Bilirubin: 0.5 mg/dL (ref 0.2–1.2)
Total Protein: 7.8 g/dL (ref 6.0–8.3)

## 2020-06-15 LAB — LIPID PANEL
Cholesterol: 266 mg/dL — ABNORMAL HIGH (ref 0–200)
HDL: 60.9 mg/dL (ref >39.00)
LDL Cholesterol: 190 mg/dL — ABNORMAL HIGH (ref 0–99)
NonHDL: 204.93
Total CHOL/HDL Ratio: 4
Triglycerides: 74 mg/dL (ref 0.0–149.0)
VLDL: 14.8 mg/dL (ref 0.0–40.0)

## 2020-06-15 NOTE — Patient Instructions (Addendum)
For you wellness exam today I have ordered cbc, cmp and lipid panel.  Flu vaccine declined.  Recommend exercise and healthy diet.   We will let you know lab results as they come in.  Call gyn and get scheduled for pap. Please get them to fax over pap results.  Follow up date appointment will be determined after lab review.    Preventive Care 47-48 Years Old, Female Preventive care refers to visits with your health care provider and lifestyle choices that can promote health and wellness. This includes:  A yearly physical exam. This may also be called an annual well check.  Regular dental visits and eye exams.  Immunizations.  Screening for certain conditions.  Healthy lifestyle choices, such as eating a healthy diet, getting regular exercise, not using drugs or products that contain nicotine and tobacco, and limiting alcohol use. What can I expect for my preventive care visit? Physical exam Your health care provider will check your:  Height and weight. This may be used to calculate body mass index (BMI), which tells if you are at a healthy weight.  Heart rate and blood pressure.  Skin for abnormal spots. Counseling Your health care provider may ask you questions about your:  Alcohol, tobacco, and drug use.  Emotional well-being.  Home and relationship well-being.  Sexual activity.  Eating habits.  Work and work Statistician.  Method of birth control.  Menstrual cycle.  Pregnancy history. What immunizations do I need?  Influenza (flu) vaccine  This is recommended every year. Tetanus, diphtheria, and pertussis (Tdap) vaccine  You may need a Td booster every 10 years. Varicella (chickenpox) vaccine  You may need this if you have not been vaccinated. Zoster (shingles) vaccine  You may need this after age 46. Measles, mumps, and rubella (MMR) vaccine  You may need at least one dose of MMR if you were born in 1957 or later. You may also need a second  dose. Pneumococcal conjugate (PCV13) vaccine  You may need this if you have certain conditions and were not previously vaccinated. Pneumococcal polysaccharide (PPSV23) vaccine  You may need one or two doses if you smoke cigarettes or if you have certain conditions. Meningococcal conjugate (MenACWY) vaccine  You may need this if you have certain conditions. Hepatitis A vaccine  You may need this if you have certain conditions or if you travel or work in places where you may be exposed to hepatitis A. Hepatitis B vaccine  You may need this if you have certain conditions or if you travel or work in places where you may be exposed to hepatitis B. Haemophilus influenzae type b (Hib) vaccine  You may need this if you have certain conditions. Human papillomavirus (HPV) vaccine  If recommended by your health care provider, you may need three doses over 6 months. You may receive vaccines as individual doses or as more than one vaccine together in one shot (combination vaccines). Talk with your health care provider about the risks and benefits of combination vaccines. What tests do I need? Blood tests  Lipid and cholesterol levels. These may be checked every 5 years, or more frequently if you are over 63 years old.  Hepatitis C test.  Hepatitis B test. Screening  Lung cancer screening. You may have this screening every year starting at age 57 if you have a 30-pack-year history of smoking and currently smoke or have quit within the past 15 years.  Colorectal cancer screening. All adults should have this screening starting  at age 96 and continuing until age 31. Your health care provider may recommend screening at age 56 if you are at increased risk. You will have tests every 1-10 years, depending on your results and the type of screening test.  Diabetes screening. This is done by checking your blood sugar (glucose) after you have not eaten for a while (fasting). You may have this done every  1-3 years.  Mammogram. This may be done every 1-2 years. Talk with your health care provider about when you should start having regular mammograms. This may depend on whether you have a family history of breast cancer.  BRCA-related cancer screening. This may be done if you have a family history of breast, ovarian, tubal, or peritoneal cancers.  Pelvic exam and Pap test. This may be done every 3 years starting at age 39. Starting at age 65, this may be done every 5 years if you have a Pap test in combination with an HPV test. Other tests  Sexually transmitted disease (STD) testing.  Bone density scan. This is done to screen for osteoporosis. You may have this scan if you are at high risk for osteoporosis. Follow these instructions at home: Eating and drinking  Eat a diet that includes fresh fruits and vegetables, whole grains, lean protein, and low-fat dairy.  Take vitamin and mineral supplements as recommended by your health care provider.  Do not drink alcohol if: ? Your health care provider tells you not to drink. ? You are pregnant, may be pregnant, or are planning to become pregnant.  If you drink alcohol: ? Limit how much you have to 0-1 drink a day. ? Be aware of how much alcohol is in your drink. In the [place] one drink equals one 12 oz bottle of beer (355 mL), one 5 oz glass of wine (148 mL), or one 1 oz glass of hard liquor (44 mL). Lifestyle  Take daily care of your teeth and gums.  Stay active. Exercise for at least 30 minutes on 5 or more days each week.  Do not use any products that contain nicotine or tobacco, such as cigarettes, e-cigarettes, and chewing tobacco. If you need help quitting, ask your health care provider.  If you are sexually active, practice safe sex. Use a condom or other form of birth control (contraception) in order to prevent pregnancy and STIs (sexually transmitted infections).  If told by your health care provider, take low-dose aspirin daily  starting at age 110. What's next?  Visit your health care provider once a year for a well check visit.  Ask your health care provider how often you should have your eyes and teeth checked.  Stay up to date on all vaccines. This information is not intended to replace advice given to you by your health care provider. Make sure you discuss any questions you have with your health care provider. Document Revised: 02/04/2018 Document Reviewed: 02/04/2018 Elsevier Patient Education  2020 [employer].

## 2020-06-15 NOTE — Progress Notes (Signed)
Subjective:    Patient ID: Lorraine Conrad, female    DOB: 1971-02-20, 50 y.o.   MRN: [id number]  HPI  Pt in for cpe/wellness.  Pt is fasting. Pt has not been exercising. Pt states last 2 days eating healthier. Pt states recently no caffeine beverages. No alcohol and none smoker.  01-2020 mammogram done. 03-2017 colonoscopy done. On report states repeat in 10 years. Pt states last pap smear before covid. Maybe 2019 or 2018.   Pt had covid vaccine and booster. She is declining flu vaccine.     Review of Systems  Constitutional: Negative for chills, fatigue and fever.  Respiratory: Negative for chest tightness, shortness of breath and wheezing.   Cardiovascular: Negative for chest pain and palpitations.  Gastrointestinal: Negative for abdominal pain.  Genitourinary: Negative for dysuria, flank pain and frequency.  Musculoskeletal: Negative for back pain, myalgias and neck pain.  Skin: Negative for rash.  Neurological: Negative for dizziness, weakness, light-headedness and headaches.  Hematological: Negative for adenopathy. Does not bruise/bleed easily.  Psychiatric/Behavioral: Negative for behavioral problems and confusion.    Past Medical History:  Diagnosis Date  . Allergy    SEASONAL  . Anemia   . Arthritis    "spine" (03/01/2014)  . Asthma   . Chronic lower back pain   . Endometriosis   . Erosive gastritis   . External hemorrhoids   . GERD (gastroesophageal reflux disease)   . H/O shortness of breath 2014   Cardiopulmonary exercise test results  . Headache    "probably monthly" (03/01/2014)  . Migraine 1998; 02/2014   "this one's lasted 10 days straight" (03/01/2014)  . OSA on CPAP   . Ovarian cyst, left   . Proctalgia fugax   . Sleep apnea    on cpap  . Thyroid disease    thyroid nodule     Social History   Socioeconomic History  . Marital status: Married    Spouse name: Not on file  . Number of children: 2  . Years of education: Not on file  .  Highest education level: Not on file  Occupational History  . Occupation: homemaker  Tobacco Use  . Smoking status: Never Smoker  . Smokeless tobacco: Never Used  . Tobacco comment: father smoked in house growing up per pt.   Vaping Use  . Vaping Use: Never used  Substance and Sexual Activity  . Alcohol use: Yes    Alcohol/week: 0.0 standard drinks    Comment: 12-23-16 "I'll have 1-2 drinks maybe 3-4 times/yr"  . Drug use: No  . Sexual activity: Yes    Birth control/protection: Surgical  Other Topics Concern  . Not on file  Social History Narrative   Daily caffiene use: 6 cups per day   Patient does not get regular excercise   Social Determinants of Health   Financial Resource Strain: Not on file  Food Insecurity: Not on file  Transportation Needs: Not on file  Physical Activity: Not on file  Stress: Not on file  Social Connections: Not on file  Intimate Partner Violence: Not on file    Past Surgical History:  Procedure Laterality Date  . ABDOMINAL HYSTERECTOMY  04/2013  . BREAST BIOPSY Bilateral 10/05/2000  . BREAST EXCISIONAL BIOPSY Left 1991  . CESAREAN SECTION  2002; 2006  . COLONOSCOPY    . LAPAROSCOPIC OVARIAN CYSTECTOMY  ~ 1999  . NASAL SEPTUM SURGERY  ~ 1991  . OOPHORECTOMY Right 2015  . TEMPOROMANDIBULAR JOINT SURGERY  ~  1992  . UPPER GASTROINTESTINAL ENDOSCOPY      Family History  Problem Relation Age of Onset  . Prostate cancer Paternal Grandfather   . Colon polyps Paternal Grandfather   . Colon polyps Father   . Heart disease Father   . Diverticulitis Mother   . Colon polyps Paternal Grandmother   . Breast cancer Paternal Grandmother   . Colon cancer Neg Hx   . Esophageal cancer Neg Hx   . Rectal cancer Neg Hx   . Stomach cancer Neg Hx   . Pancreatic cancer Neg Hx     Allergies  Allergen Reactions  . Promethazine Hcl Other (See Comments)    Violent tremors   . Cephalexin Diarrhea and Itching  . Codeine Other (See Comments)    Was a  child when she took this.  . Dilaudid [Hydromorphone] Nausea And Vomiting  . Soy Allergy Itching  . Azithromycin Rash    Head to toe rash    Current Outpatient Medications on File Prior to Visit  Medication Sig Dispense Refill  . albuterol (PROAIR HFA) 108 (90 Base) MCG/ACT inhaler USE 2 PUFFS INTO LUNGS EVERY 6 HOURS AS NEEDED FOR WHEEZING OR SHORTNESS OF BREATH 18 g 3  . conjugated estrogens (PREMARIN) vaginal cream as needed    . fluticasone (FLONASE) 50 MCG/ACT nasal spray Place 2 sprays into both nostrils daily. 16 g 1  . HYDROcodone-acetaminophen (NORCO) 5-325 MG tablet Take 1 tablet by mouth every 6 (six) hours as needed for moderate pain or severe pain. 8 tablet 0  . hydrOXYzine (ATARAX/VISTARIL) 10 MG tablet Take 1 tablet (10 mg total) by mouth 3 (three) times daily as needed for itching. 30 tablet 0  . loratadine (CLARITIN) 10 MG tablet Take 1 tablet (10 mg total) by mouth daily. 30 tablet 0  . methocarbamol (ROBAXIN) 500 MG tablet Take 1 tablet (500 mg total) by mouth 2 (two) times daily. 20 tablet 0  . methylPREDNISolone (MEDROL) 4 MG tablet Standard 6 day taper dose 21 tablet 0  . azelastine (ASTELIN) 0.1 % nasal spray Place 2 sprays into both nostrils 2 (two) times daily. Use in each nostril as directed (Patient not taking: Reported on 06/15/2020) 30 mL 3   No current facility-administered medications on file prior to visit.    BP 112/74   Pulse (!) 104   Temp 98.2 F (36.8 C) (Oral)   Resp 16   Ht 5\' 4"  (1.626 m)   Wt 219 lb (99.3 kg)   LMP 02/08/2013   SpO2 97%   BMI 37.59 kg/m       Objective:   Physical Exam   General Mental Status- Alert. General Appearance- Not in acute distress.   Skin General: Color- Normal Color. Moisture- Normal Moisture. No worrisome lesions or moles.  Neck Carotid Arteries- Normal color. Moisture- Normal Moisture. No carotid bruits. No JVD.  Chest and Lung Exam Auscultation: Breath  Sounds:-Normal.  Cardiovascular Auscultation:Rythm- Regular. Murmurs & Other Heart Sounds:Auscultation of the heart reveals- No Murmurs.  Abdomen Inspection:-Inspeection Normal. Palpation/Percussion:Note:No mass. Palpation and Percussion of the abdomen reveal- Non Tender, Non Distended + BS, no rebound or guarding.   Neurologic Cranial Nerve exam:- CN III-XII intact(No nystagmus), symmetric smile. Strength:- 5/5 equal and symmetric strength both upper and lower extremities.     Assessment & Plan:  For you wellness exam today I have ordered cbc, cmp and lipid panel.  Flu vaccine declined.  Recommend exercise and healthy diet.   We will let you  know lab results as they come in.  Call gyn and get scheduled for pap. Please get them to fax over pap results.   Follow up date appointment will be determined after lab review.    Mackie Pai, PA-C

## 2020-06-16 ENCOUNTER — Telehealth: Payer: Self-pay | Admitting: Medical

## 2020-06-16 DIAGNOSIS — D75839 Thrombocytosis, unspecified: Secondary | ICD-10-CM

## 2020-06-16 NOTE — Telephone Encounter (Signed)
Referral to hematologist placed. °

## 2020-06-18 ENCOUNTER — Telehealth: Payer: Self-pay | Admitting: Medical

## 2020-06-18 DIAGNOSIS — D75839 Thrombocytosis, unspecified: Secondary | ICD-10-CM

## 2020-06-18 DIAGNOSIS — E785 Hyperlipidemia, unspecified: Secondary | ICD-10-CM

## 2020-06-18 MED ORDER — ATORVASTATIN CALCIUM 10 MG PO TABS: 10.0000 mg | ORAL_TABLET | Freq: Every day | ORAL | 3 refills | Status: DC

## 2020-06-18 NOTE — Telephone Encounter (Signed)
Future lipid panel and cmp placed. 

## 2020-06-18 NOTE — Telephone Encounter (Signed)
Rx atorvastatin sent to pt pharmacy. 

## 2020-06-18 NOTE — Telephone Encounter (Signed)
Referral to Dr. Lindi Adie  Hematologist. Will you cancel referral to Dr. Marin Olp. Per pt request.  See new referral placed.

## 2020-06-22 ENCOUNTER — Telehealth: Payer: Self-pay | Admitting: Hematology and Oncology

## 2020-06-22 ENCOUNTER — Encounter: Payer: Self-pay | Admitting: Medical

## 2020-06-22 ENCOUNTER — Telehealth: Payer: Self-pay | Admitting: Medical

## 2020-06-22 DIAGNOSIS — M545 Low back pain, unspecified: Secondary | ICD-10-CM

## 2020-06-22 DIAGNOSIS — G8929 Other chronic pain: Secondary | ICD-10-CM

## 2020-06-22 MED ORDER — METHOCARBAMOL 500 MG PO TABS: 500.0000 mg | ORAL_TABLET | Freq: Two times a day (BID) | ORAL | 0 refills | Status: DC

## 2020-06-22 NOTE — Telephone Encounter (Signed)
Robaxin rx sent to pt pharmacy.

## 2020-06-22 NOTE — Telephone Encounter (Signed)
I received a staff msg stating Lorraine Conrad would like to see Dr. Lindi Adie. She's been scheduled to see Dr. Lindi Adie on 1/31 at 1130am. I was unable to reach the pt on both numbers provided in demographics. A mychart msg has been sent.

## 2020-06-28 DIAGNOSIS — M6281 Muscle weakness (generalized): Secondary | ICD-10-CM | POA: Diagnosis not present

## 2020-06-28 DIAGNOSIS — R293 Abnormal posture: Secondary | ICD-10-CM | POA: Diagnosis not present

## 2020-06-28 DIAGNOSIS — M7918 Myalgia, other site: Secondary | ICD-10-CM | POA: Diagnosis not present

## 2020-06-28 DIAGNOSIS — M5489 Other dorsalgia: Secondary | ICD-10-CM | POA: Diagnosis not present

## 2020-07-03 ENCOUNTER — Ambulatory Visit: Payer: Self-pay

## 2020-07-03 ENCOUNTER — Other Ambulatory Visit: Payer: Self-pay

## 2020-07-03 ENCOUNTER — Ambulatory Visit (INDEPENDENT_AMBULATORY_CARE_PROVIDER_SITE_OTHER): Payer: BC Managed Care – PPO | Admitting: Family Medicine

## 2020-07-03 VITALS — BP 102/74 | Ht 64.0 in | Wt 217.0 lb

## 2020-07-03 DIAGNOSIS — R29898 Other symptoms and signs involving the musculoskeletal system: Secondary | ICD-10-CM

## 2020-07-03 DIAGNOSIS — G629 Polyneuropathy, unspecified: Secondary | ICD-10-CM

## 2020-07-03 DIAGNOSIS — M79605 Pain in left leg: Secondary | ICD-10-CM

## 2020-07-03 NOTE — Assessment & Plan Note (Signed)
Has nonspecific findings on ultrasound of possible inflammatory change.  Also has had previous work-ups with no specific diagnosis at this point. Had mild elevation of previous ANA panel and sedimentation rate. -ANA, Lyme, iron and ferritin -Can consider punch biopsy of area of tenderness on her left leg to evaluate with pathology.

## 2020-07-03 NOTE — Progress Notes (Signed)
Lorraine Conrad - 50 y.o. female MRN [id number]  Date of birth: January 01, 1971  SUBJECTIVE:  Including CC & ROS.  No chief complaint on file.   Lorraine Conrad is a 50 y.o. female that is presenting with fatigue and weakness in her lower legs.  She is also presenting with pinpoint area of pain on the left anterior lower tibia.  This presented with no inciting event or trauma.  No previous history of similar issue.  She has a long history of work-up for her symptoms.  Has had a nerve study as well as previous lab testing and MRI for her symptoms.  No specific diagnosis has been arrived to.  There was concern of a possible multiple sclerosis diagnosis.  She reports a disc bulging in the lumbar spine.  She does get improvement with prednisone but this most recent time her symptoms were not completely resolved..  Independent review of the lumbar spine x-ray from 2016 shows mild degenerative changes of facet joints.   Review of Systems See HPI   HISTORY: Past Medical, Surgical, Social, and Family History Reviewed & Updated per EMR.   Pertinent Historical Findings include:  Past Medical History:  Diagnosis Date  . Allergy    SEASONAL  . Anemia   . Arthritis    "spine" (03/01/2014)  . Asthma   . Chronic lower back pain   . Endometriosis   . Erosive gastritis   . External hemorrhoids   . GERD (gastroesophageal reflux disease)   . H/O shortness of breath 2014   Cardiopulmonary exercise test results  . Headache    "probably monthly" (03/01/2014)  . Migraine 1998; 02/2014   "this one's lasted 10 days straight" (03/01/2014)  . OSA on CPAP   . Ovarian cyst, left   . Proctalgia fugax   . Sleep apnea    on cpap  . Thyroid disease    thyroid nodule    Past Surgical History:  Procedure Laterality Date  . ABDOMINAL HYSTERECTOMY  04/2013  . BREAST BIOPSY Bilateral 10/05/2000  . BREAST EXCISIONAL BIOPSY Left 1991  . CESAREAN SECTION  2002; 2006  . COLONOSCOPY    . LAPAROSCOPIC OVARIAN  CYSTECTOMY  ~ 1999  . NASAL SEPTUM SURGERY  ~ 1991  . OOPHORECTOMY Right 2015  . TEMPOROMANDIBULAR JOINT SURGERY  ~ 57  . UPPER GASTROINTESTINAL ENDOSCOPY      Family History  Problem Relation Age of Onset  . Prostate cancer Paternal Grandfather   . Colon polyps Paternal Grandfather   . Colon polyps Father   . Heart disease Father   . Diverticulitis Mother   . Colon polyps Paternal Grandmother   . Breast cancer Paternal Grandmother   . Colon cancer Neg Hx   . Esophageal cancer Neg Hx   . Rectal cancer Neg Hx   . Stomach cancer Neg Hx   . Pancreatic cancer Neg Hx     Social History   Socioeconomic History  . Marital status: Married    Spouse name: Not on file  . Number of children: 2  . Years of education: Not on file  . Highest education level: Not on file  Occupational History  . Occupation: homemaker  Tobacco Use  . Smoking status: Never Smoker  . Smokeless tobacco: Never Used  . Tobacco comment: father smoked in house growing up per pt.   Vaping Use  . Vaping Use: Never used  Substance and Sexual Activity  . Alcohol use: Yes    Alcohol/week: 0.0  standard drinks    Comment: 12-23-16 "I'll have 1-2 drinks maybe 3-4 times/yr"  . Drug use: No  . Sexual activity: Yes    Birth control/protection: Surgical  Other Topics Concern  . Not on file  Social History Narrative   Daily caffiene use: 6 cups per day   Patient does not get regular excercise   Social Determinants of Health   Financial Resource Strain: Not on file  Food Insecurity: Not on file  Transportation Needs: Not on file  Physical Activity: Not on file  Stress: Not on file  Social Connections: Not on file  Intimate Partner Violence: Not on file     PHYSICAL EXAM:  VS: BP 102/74   Ht 5\' 4"  (1.626 m)   Wt 217 lb (98.4 kg)   LMP 02/08/2013   BMI 37.25 kg/m  Physical Exam Gen: NAD, alert, cooperative with exam, well-appearing MSK:  Left lower leg: Tenderness palpation over the mid medial  aspect of lower leg. No overlying redness or swelling. Neurovascular intact  Limited ultrasound: Left lower leg:  No changes appreciated within the cortex of the tibia. Normal-appearing musculature. Increased hyperemia in the subcutaneous at the mid tibial aspect.  Nonspecific hypoechoic change in this area of point tenderness.  Summary: Nonspecific changes on ultrasound to suggest possible inflammatory change of the subcutaneous  Ultrasound and interpretation by Clearance Coots, MD   ASSESSMENT & PLAN:   Leg weakness, bilateral Reports disc bulging and has weakness as well as pain.  Does usually get improvement with prednisone but has not resolved at this time. -Counseled on home exercise therapy and supportive care. -Referral to physical therapy.  Polyneuropathy Has nonspecific findings on ultrasound of possible inflammatory change.  Also has had previous work-ups with no specific diagnosis at this point. Had mild elevation of previous ANA panel and sedimentation rate. -ANA, Lyme, iron and ferritin -Can consider punch biopsy of area of tenderness on her left leg to evaluate with pathology.

## 2020-07-03 NOTE — Assessment & Plan Note (Signed)
Reports disc bulging and has weakness as well as pain.  Does usually get improvement with prednisone but has not resolved at this time. -Counseled on home exercise therapy and supportive care. -Referral to physical therapy.

## 2020-07-03 NOTE — Patient Instructions (Signed)
Nice to meet you Physical therapy should give you a call.  I will call with the results.   Please send me a message in MyChart with any questions or updates.  Please see [place] back in 4 weeks.   --Dr. Raeford Razor

## 2020-07-04 ENCOUNTER — Telehealth: Payer: Self-pay | Admitting: Family Medicine

## 2020-07-04 ENCOUNTER — Encounter: Payer: Self-pay | Admitting: Family Medicine

## 2020-07-04 DIAGNOSIS — M6281 Muscle weakness (generalized): Secondary | ICD-10-CM | POA: Diagnosis not present

## 2020-07-04 DIAGNOSIS — M7918 Myalgia, other site: Secondary | ICD-10-CM | POA: Diagnosis not present

## 2020-07-04 DIAGNOSIS — M5489 Other dorsalgia: Secondary | ICD-10-CM | POA: Diagnosis not present

## 2020-07-04 DIAGNOSIS — R293 Abnormal posture: Secondary | ICD-10-CM | POA: Diagnosis not present

## 2020-07-04 DIAGNOSIS — G629 Polyneuropathy, unspecified: Secondary | ICD-10-CM

## 2020-07-04 NOTE — Telephone Encounter (Signed)
Unable to leave VM for patient. If she calls back please have her speak with a nurse/CMA and inform that her ferritin was elevated which could be a sign of nonspecific inflammatory changes.  We are awaiting her other lab results to return.   If any questions then please take the best time and phone number to call and I will try to call her back.   Rosemarie Ax, MD [facility] 07/04/2020, 2:48 PM

## 2020-07-05 LAB — IRON,TIBC AND FERRITIN PANEL
Ferritin: 172 ng/mL — ABNORMAL HIGH (ref 15–150)
Iron Saturation: 16 % (ref 15–55)
Iron: 38 ug/dL (ref 27–159)
Total Iron Binding Capacity: 232 ug/dL — ABNORMAL LOW (ref 250–450)
UIBC: 194 ug/dL (ref 131–425)

## 2020-07-05 LAB — ANA,IFA RA DIAG PNL W/RFLX TIT/PATN
ANA Titer 1: NEGATIVE
Cyclic Citrullin Peptide Ab: 6 units (ref 0–19)
Rheumatoid fact SerPl-aCnc: <10 IU/mL (ref <14.0)

## 2020-07-05 NOTE — Telephone Encounter (Signed)
Referral placed to derm.   Rosemarie Ax, MD [facility] 07/05/2020, 8:06 AM

## 2020-07-05 NOTE — Progress Notes (Signed)
Dr Allyn Kenner [place] [id number] [phone] Friday 07/20/20 @ 10am

## 2020-07-06 ENCOUNTER — Other Ambulatory Visit: Payer: Self-pay | Admitting: *Deleted

## 2020-07-06 DIAGNOSIS — B078 Other viral warts: Secondary | ICD-10-CM | POA: Diagnosis not present

## 2020-07-06 DIAGNOSIS — D696 Thrombocytopenia, unspecified: Secondary | ICD-10-CM

## 2020-07-06 DIAGNOSIS — L2089 Other atopic dermatitis: Secondary | ICD-10-CM | POA: Diagnosis not present

## 2020-07-08 NOTE — Progress Notes (Signed)
[facility] CONSULT NOTE  Patient Care Team: Saguier, Iris Pert as PCP - General (Physician Assistant)  CHIEF COMPLAINTS/PURPOSE OF CONSULTATION:  Newly diagnosed thrombocytosis  HISTORY OF PRESENTING ILLNESS:  Lorraine Conrad 50 y.o. female is here because of recent diagnosis of thrombocytosis. She is referred by Mackie Pai, PA, her PCP. Labs show elevated platelets between 420-460 going back to 2017, and most recently on on 06/15/20 showed platelets 424. She presents to the clinic today for initial evaluation.  I reviewed her records extensively and collaborated the history with the patient.  MEDICAL HISTORY:  Past Medical History:  Diagnosis Date   Allergy    SEASONAL   Anemia    Arthritis    "spine" (03/01/2014)   Asthma    Chronic lower back pain    Endometriosis    Erosive gastritis    External hemorrhoids    GERD (gastroesophageal reflux disease)    H/O shortness of breath 2014   Cardiopulmonary exercise test results   Headache    "probably monthly" (03/01/2014)   Migraine 1998; 02/2014   "this one's lasted 10 days straight" (03/01/2014)   OSA on CPAP    Ovarian cyst, left    Proctalgia fugax    Sleep apnea    on cpap   Thyroid disease    thyroid nodule    SURGICAL HISTORY: Past Surgical History:  Procedure Laterality Date   ABDOMINAL HYSTERECTOMY  04/2013   BREAST BIOPSY Bilateral 10/05/2000   BREAST EXCISIONAL BIOPSY Left 1991   CESAREAN SECTION  2002; 2006   COLONOSCOPY     LAPAROSCOPIC OVARIAN CYSTECTOMY  ~ 1999   NASAL SEPTUM SURGERY  ~ 1991   OOPHORECTOMY Right 2015   TEMPOROMANDIBULAR JOINT SURGERY  ~ 1992   UPPER GASTROINTESTINAL ENDOSCOPY      SOCIAL HISTORY: Social History   Socioeconomic History   Marital status: Married    Spouse name: Not on file   Number of children: 2   Years of education: Not on file   Highest education level: Not on file  Occupational History   Occupation:  homemaker  Tobacco Use   Smoking status: Never Smoker   Smokeless tobacco: Never Used   Tobacco comment: father smoked in house growing up per pt.   Vaping Use   Vaping Use: Never used  Substance and Sexual Activity   Alcohol use: Yes    Alcohol/week: 0.0 standard drinks    Comment: 12-23-16 "I'll have 1-2 drinks maybe 3-4 times/yr"   Drug use: No   Sexual activity: Yes    Birth control/protection: Surgical  Other Topics Concern   Not on file  Social History Narrative   Daily caffiene use: 6 cups per day   Patient does not get regular excercise   Social Determinants of Health   Financial Resource Strain: Not on file  Food Insecurity: Not on file  Transportation Needs: Not on file  Physical Activity: Not on file  Stress: Not on file  Social Connections: Not on file  Intimate Partner Violence: Not on file    FAMILY HISTORY: Family History  Problem Relation Age of Onset   Prostate cancer Paternal Grandfather    Colon polyps Paternal Grandfather    Colon polyps Father    Heart disease Father    Diverticulitis Mother    Colon polyps Paternal Grandmother    Breast cancer Paternal Grandmother    Colon cancer Neg Hx    Esophageal cancer Neg Hx    Rectal  cancer Neg Hx    Stomach cancer Neg Hx    Pancreatic cancer Neg Hx     ALLERGIES:  is allergic to promethazine hcl, cephalexin, codeine, dilaudid [hydromorphone], soy allergy, and azithromycin.  MEDICATIONS:  Current Outpatient Medications  Medication Sig Dispense Refill   albuterol (PROAIR HFA) 108 (90 Base) MCG/ACT inhaler USE 2 PUFFS INTO LUNGS EVERY 6 HOURS AS NEEDED FOR WHEEZING OR SHORTNESS OF BREATH 18 g 3   atorvastatin (LIPITOR) 10 MG tablet Take 1 tablet (10 mg total) by mouth daily. 30 tablet 3   azelastine (ASTELIN) 0.1 % nasal spray Place 2 sprays into both nostrils 2 (two) times daily. Use in each nostril as directed (Patient not taking: Reported on 06/15/2020) 30 mL 3   conjugated  estrogens (PREMARIN) vaginal cream as needed     fluticasone (FLONASE) 50 MCG/ACT nasal spray Place 2 sprays into both nostrils daily. 16 g 1   HYDROcodone-acetaminophen (NORCO) 5-325 MG tablet Take 1 tablet by mouth every 6 (six) hours as needed for moderate pain or severe pain. 8 tablet 0   hydrOXYzine (ATARAX/VISTARIL) 10 MG tablet Take 1 tablet (10 mg total) by mouth 3 (three) times daily as needed for itching. 30 tablet 0   loratadine (CLARITIN) 10 MG tablet Take 1 tablet (10 mg total) by mouth daily. 30 tablet 0   methocarbamol (ROBAXIN) 500 MG tablet Take 1 tablet (500 mg total) by mouth 2 (two) times daily. 20 tablet 0   methylPREDNISolone (MEDROL) 4 MG tablet Standard 6 day taper dose 21 tablet 0   No current facility-administered medications for this visit.    REVIEW OF SYSTEMS:   Constitutional: Denies fevers, chills or abnormal night sweats   All other systems were reviewed with the patient and are negative.  PHYSICAL EXAMINATION: ECOG PERFORMANCE STATUS: 1 - Symptomatic but completely ambulatory  Vitals:   07/09/20 1145  BP: 111/81  Pulse: 73  Resp: 18  Temp: (!) 97.5 F (36.4 C)  SpO2: 100%   Filed Weights   07/09/20 1145  Weight: 217 lb 14.4 oz (98.8 kg)       LABORATORY DATA:  I have reviewed the data as listed Lab Results  Component Value Date   WBC 8.4 06/15/2020   HGB 13.9 06/15/2020   HCT 41.1 06/15/2020   MCV 84.9 06/15/2020   PLT 424.0 (H) 06/15/2020   Lab Results  Component Value Date   NA 137 06/15/2020   K 4.7 06/15/2020   CL 101 06/15/2020   CO2 29 06/15/2020    RADIOGRAPHIC STUDIES: I have personally reviewed the radiological reports and agreed with the findings in the report.  ASSESSMENT AND PLAN:  Thrombocytosis Lab review: 03/22/2016: Platelets 457 06/15/2020: Platelets 424 (rest of the CBC is normal)  Mild thrombocytosis Differential diagnosis: 1.  Reactive thrombocytosis: Probably to inflammation No evidence of iron  deficiency: Labs 07/03/2020: Ferritin 172, iron saturation 16% 2. essential thrombocytosis: Usually this presents as progressively worsening platelet counts.  Her last 5 years of platelet counts were reviewed and felt to be stable.  Therefore I do not recommend doing any molecular testing for myeloproliferative neoplasms like essential thrombocytosis.  Return to clinic if the platelet count goes over 500,000 or if other cell lines are affected.   All questions were answered. The patient knows to call the clinic with any problems, questions or concerns.   Rulon Eisenmenger, MD, MPH 07/09/2020    I, Molly Dorshimer, am acting as scribe for Nicholas Lose, MD.  I have reviewed the above documentation for accuracy and completeness, and I agree with the above.

## 2020-07-09 ENCOUNTER — Inpatient Hospital Stay (HOSPITAL_BASED_OUTPATIENT_CLINIC_OR_DEPARTMENT_OTHER): Payer: BC Managed Care – PPO | Admitting: Hematology and Oncology

## 2020-07-09 ENCOUNTER — Other Ambulatory Visit: Payer: Self-pay

## 2020-07-09 ENCOUNTER — Inpatient Hospital Stay: Payer: BC Managed Care – PPO | Attending: Family Medicine

## 2020-07-09 ENCOUNTER — Encounter: Payer: Self-pay | Admitting: Hematology and Oncology

## 2020-07-09 DIAGNOSIS — Z803 Family history of malignant neoplasm of breast: Secondary | ICD-10-CM

## 2020-07-09 DIAGNOSIS — Z8371 Family history of colonic polyps: Secondary | ICD-10-CM

## 2020-07-09 DIAGNOSIS — Z8042 Family history of malignant neoplasm of prostate: Secondary | ICD-10-CM | POA: Insufficient documentation

## 2020-07-09 DIAGNOSIS — D75839 Thrombocytosis, unspecified: Secondary | ICD-10-CM | POA: Insufficient documentation

## 2020-07-09 DIAGNOSIS — D696 Thrombocytopenia, unspecified: Secondary | ICD-10-CM

## 2020-07-09 LAB — CMP (CANCER CENTER ONLY)
ALT: 12 U/L (ref 0–44)
AST: 12 U/L — ABNORMAL LOW (ref 15–41)
Albumin: 4.1 g/dL (ref 3.5–5.0)
Alkaline Phosphatase: 78 U/L (ref 38–126)
Anion gap: 9 (ref 5–15)
BUN: 12 mg/dL (ref 6–20)
CO2: 25 mmol/L (ref 22–32)
Calcium: 9.5 mg/dL (ref 8.9–10.3)
Chloride: 107 mmol/L (ref 98–111)
Creatinine: 0.94 mg/dL (ref 0.44–1.00)
GFR, Estimated: >60 mL/min (ref >60)
Glucose, Bld: 86 mg/dL (ref 70–99)
Potassium: 4.6 mmol/L (ref 3.5–5.1)
Sodium: 141 mmol/L (ref 135–145)
Total Bilirubin: 0.4 mg/dL (ref 0.3–1.2)
Total Protein: 7.7 g/dL (ref 6.5–8.1)

## 2020-07-09 NOTE — Assessment & Plan Note (Signed)
Lab review: 03/22/2016: Platelets 457 06/15/2020: Platelets 424 (rest of the CBC is normal)  Mild thrombocytosis Differential diagnosis: 1.  Reactive thrombocytosis: Probably to inflammation No evidence of iron deficiency: Labs 07/03/2020: Ferritin 172, iron saturation 16% 2. essential thrombocytosis: Usually this presents as progressively worsening platelet counts.  Her last 5 years of platelet counts were reviewed and felt to be stable.  Therefore I do not recommend doing any molecular testing for myeloproliferative neoplasms like essential thrombocytosis.  Return to clinic if the platelet count goes over 500,000 or if other cell lines are affected.

## 2020-07-10 DIAGNOSIS — M6281 Muscle weakness (generalized): Secondary | ICD-10-CM | POA: Diagnosis not present

## 2020-07-10 DIAGNOSIS — M5489 Other dorsalgia: Secondary | ICD-10-CM | POA: Diagnosis not present

## 2020-07-10 DIAGNOSIS — R293 Abnormal posture: Secondary | ICD-10-CM | POA: Diagnosis not present

## 2020-07-10 DIAGNOSIS — M7918 Myalgia, other site: Secondary | ICD-10-CM | POA: Diagnosis not present

## 2020-07-11 ENCOUNTER — Ambulatory Visit: Payer: BC Managed Care – PPO | Admitting: Family

## 2020-07-11 ENCOUNTER — Other Ambulatory Visit: Payer: BC Managed Care – PPO

## 2020-07-12 DIAGNOSIS — M6281 Muscle weakness (generalized): Secondary | ICD-10-CM | POA: Diagnosis not present

## 2020-07-12 DIAGNOSIS — M5489 Other dorsalgia: Secondary | ICD-10-CM | POA: Diagnosis not present

## 2020-07-12 DIAGNOSIS — R293 Abnormal posture: Secondary | ICD-10-CM | POA: Diagnosis not present

## 2020-07-12 DIAGNOSIS — M7918 Myalgia, other site: Secondary | ICD-10-CM | POA: Diagnosis not present

## 2020-07-16 DIAGNOSIS — M5489 Other dorsalgia: Secondary | ICD-10-CM | POA: Diagnosis not present

## 2020-07-16 DIAGNOSIS — M7918 Myalgia, other site: Secondary | ICD-10-CM | POA: Diagnosis not present

## 2020-07-16 DIAGNOSIS — M6281 Muscle weakness (generalized): Secondary | ICD-10-CM | POA: Diagnosis not present

## 2020-07-16 DIAGNOSIS — R293 Abnormal posture: Secondary | ICD-10-CM | POA: Diagnosis not present

## 2020-07-17 ENCOUNTER — Other Ambulatory Visit: Payer: Self-pay | Admitting: Medical

## 2020-08-03 ENCOUNTER — Ambulatory Visit: Payer: BC Managed Care – PPO | Admitting: Family Medicine

## 2020-08-22 DIAGNOSIS — M79604 Pain in right leg: Secondary | ICD-10-CM | POA: Diagnosis not present

## 2020-08-22 DIAGNOSIS — M79605 Pain in left leg: Secondary | ICD-10-CM | POA: Diagnosis not present

## 2020-08-22 DIAGNOSIS — M79651 Pain in right thigh: Secondary | ICD-10-CM | POA: Diagnosis not present

## 2020-08-22 DIAGNOSIS — M79652 Pain in left thigh: Secondary | ICD-10-CM | POA: Diagnosis not present

## 2020-11-30 DIAGNOSIS — M79604 Pain in right leg: Secondary | ICD-10-CM | POA: Diagnosis not present

## 2020-12-14 DIAGNOSIS — M25571 Pain in right ankle and joints of right foot: Secondary | ICD-10-CM | POA: Diagnosis not present

## 2020-12-18 ENCOUNTER — Other Ambulatory Visit: Payer: Self-pay | Admitting: Family Medicine

## 2020-12-18 ENCOUNTER — Other Ambulatory Visit: Payer: Self-pay | Admitting: Obstetrics and Gynecology

## 2020-12-18 DIAGNOSIS — Z1231 Encounter for screening mammogram for malignant neoplasm of breast: Secondary | ICD-10-CM

## 2020-12-19 ENCOUNTER — Other Ambulatory Visit: Payer: Self-pay

## 2020-12-19 ENCOUNTER — Ambulatory Visit
Admission: RE | Admit: 2020-12-19 | Discharge: 2020-12-19 | Disposition: A | Discharge status: Home / Self Care | Payer: BC Managed Care – PPO | Source: Ambulatory Visit | Attending: Obstetrics and Gynecology | Admitting: Obstetrics and Gynecology

## 2020-12-19 DIAGNOSIS — Z1231 Encounter for screening mammogram for malignant neoplasm of breast: Secondary | ICD-10-CM

## 2020-12-27 ENCOUNTER — Telehealth (INDEPENDENT_AMBULATORY_CARE_PROVIDER_SITE_OTHER): Payer: BC Managed Care – PPO | Admitting: Family Medicine

## 2020-12-27 ENCOUNTER — Encounter: Payer: Self-pay | Admitting: Family Medicine

## 2020-12-27 VITALS — HR 86

## 2020-12-27 DIAGNOSIS — U071 COVID-19: Secondary | ICD-10-CM | POA: Diagnosis not present

## 2020-12-27 MED ORDER — ALBUTEROL SULFATE HFA 108 (90 BASE) MCG/ACT IN AERS: INHALATION_SPRAY | RESPIRATORY_TRACT | 3 refills | Status: DC

## 2020-12-27 MED ORDER — NIRMATRELVIR/RITONAVIR (PAXLOVID)TABLET: 3.0000 | ORAL_TABLET | Freq: Two times a day (BID) | ORAL | 0 refills | Status: AC

## 2020-12-27 NOTE — Progress Notes (Signed)
[facility] [address] [place], [place] [address] [phone]  Date:  12/27/2020   Name:  Lorraine Conrad   DOB:  1970-06-12   MRN:  [id number]  PCP:  Lorraine Pai, PA-C    Chief Complaint: No chief complaint on file.   History of Present Illness:  Lorraine Conrad is a 50 y.o. very pleasant female patient who presents with the following:  Primary patient of my partner Lorraine Conrad Virtual visit today for concern of COVID-19 Patient location is home, my location is also home Patient identified with 2 factors, she gives consent for virtual visit today. Past medical history including psoriasis, sleep apnea, IBS She does use CPAP  Pt notes that her sx started on 7/19- aches all over, cough She used some OTC meds  She felt feverish, ST She started to rally yesterday, but felt worse again today She tested for covid and it was positive today She notes a productive cough Her chest is congested She has a HA  Her throat is better No vomiting or diarrhea  Her husband is ill as well  Patient Active Problem List   Diagnosis Date Noted   Thrombocytosis 07/09/2020   Polyneuropathy 07/03/2020   Dysphasia 10/12/2017   Globus pharyngeus 10/12/2017   Paresthesias 04/14/2017   Cervical radiculopathy 12/29/2016   Lumbar radiculopathy 27/08/5007   Diastolic dysfunction 38/18/2993   Mass of ovary 07/12/2015   History of laparoscopy 06/19/2015   Left hip pain 05/24/2015   Left knee pain 05/24/2015   Left ankle pain 05/24/2015   Chest wall pain 05/16/2015   OSA (obstructive sleep apnea) 03/01/2015   Leukocytosis 03/01/2015   Leg weakness, bilateral 03/01/2015   B12 deficiency 03/01/2015   Allergic rhinitis 12/12/2014   Dyspnea 12/12/2014   SOB (shortness of breath) 10/30/2014   Migraine 07/10/2014   Hemorrhoid 07/10/2014   H/O: hysterectomy 07/05/2014   Arthralgia 05/08/2014   Tachycardia 03/29/2014    Tinnitus 03/27/2014   Fatigue 03/27/2014   Back pain 03/27/2014   Shingles 03/09/2014   Fever 03/09/2014   Hot flashes 03/09/2014   Meningitis, viral 03/02/2014   Headache 03/01/2014   Myalgia and myositis 02/23/2014   Headache(784.0) 02/23/2014   Endometriosis 02/04/2014   Routine general medical examination at a health care facility 01/16/2014   Psoriasis 01/16/2014   Obesity (BMI 35.0-39.9 without comorbidity) 01/16/2014   Neck pain 01/01/2014   IC (interstitial cystitis) 11/14/2013   Dysuria 10/11/2013   Abnormal CBC 10/11/2013   Watery eyes 10/11/2013   Plantar fasciitis of left foot 03/18/2013   IBS (irritable bowel syndrome) 03/18/2013   Nausea alone 02/17/2013   PLEURISY 11/10/2008   GASTRITIS 08/10/2008   ANXIETY 01/06/2008   PROCTALGIA FUGAX 01/06/2008   ABDOMINAL PAIN-MULTIPLE SITES 01/06/2008   GERD 11/15/2007   CONSTIPATION 11/15/2007   Chest pain 11/15/2007   OVARIAN CYST, LEFT 11/11/2007    Past Medical History:  Diagnosis Date   Allergy    SEASONAL   Anemia    Arthritis    "spine" (03/01/2014)   Asthma    Chronic lower back pain    Endometriosis    Erosive gastritis    External hemorrhoids    GERD (gastroesophageal reflux disease)    H/O shortness of breath 2014   Cardiopulmonary exercise test results   Headache    "probably monthly" (03/01/2014)   Migraine 1998; 02/2014   "this one's lasted 10 days straight" (03/01/2014)  OSA on CPAP    Ovarian cyst, left    Proctalgia fugax    Sleep apnea    on cpap   Thyroid disease    thyroid nodule    Past Surgical History:  Procedure Laterality Date   ABDOMINAL HYSTERECTOMY  04/2013   BREAST BIOPSY Bilateral 10/05/2000   BREAST BIOPSY Right 08/26/2019   Fibroadenoma   BREAST EXCISIONAL BIOPSY Left 1991   CESAREAN SECTION  2002; 2006   COLONOSCOPY     LAPAROSCOPIC OVARIAN CYSTECTOMY  ~ 1999   NASAL SEPTUM SURGERY  ~ 1991   OOPHORECTOMY Right 2015   TEMPOROMANDIBULAR JOINT SURGERY  ~ 1992    UPPER GASTROINTESTINAL ENDOSCOPY      Social History   Tobacco Use   Smoking status: Never   Smokeless tobacco: Never   Tobacco comments:    father smoked in house growing up per pt.   Vaping Use   Vaping Use: Never used  Substance Use Topics   Alcohol use: Yes    Alcohol/week: 0.0 standard drinks    Comment: 12-23-16 "I'll have 1-2 drinks maybe 3-4 times/yr"   Drug use: No    Family History  Problem Relation Age of Onset   Prostate cancer Paternal Grandfather    Colon polyps Paternal Grandfather    Colon polyps Father    Heart disease Father    Diverticulitis Mother    Colon polyps Paternal Grandmother    Breast cancer Paternal Grandmother    Colon cancer Neg Hx    Esophageal cancer Neg Hx    Rectal cancer Neg Hx    Stomach cancer Neg Hx    Pancreatic cancer Neg Hx     Allergies  Allergen Reactions   Promethazine Hcl Other (See Comments)    Violent tremors    Cephalexin Diarrhea and Itching   Codeine Other (See Comments)    Was a child when she took this.   Dilaudid [Hydromorphone] Nausea And Vomiting   Soy Allergy Itching   Azithromycin Rash    Head to toe rash    Medication list has been reviewed and updated.  Current Outpatient Medications on File Prior to Visit  Medication Sig Dispense Refill   atorvastatin (LIPITOR) 10 MG tablet Take 1 tablet (10 mg total) by mouth daily. 30 tablet 3   azelastine (ASTELIN) 0.1 % nasal spray Place 2 sprays into both nostrils 2 (two) times daily. Use in each nostril as directed (Patient not taking: Reported on 06/15/2020) 30 mL 3   conjugated estrogens (PREMARIN) vaginal cream as needed     fluticasone (FLONASE) 50 MCG/ACT nasal spray Place 2 sprays into both nostrils daily. 16 g 1   HYDROcodone-acetaminophen (NORCO) 5-325 MG tablet Take 1 tablet by mouth every 6 (six) hours as needed for moderate pain or severe pain. 8 tablet 0   hydrOXYzine (ATARAX/VISTARIL) 10 MG tablet Take 1 tablet (10 mg total) by mouth 3 (three) times  daily as needed for itching. 30 tablet 0   loratadine (CLARITIN) 10 MG tablet Take 1 tablet (10 mg total) by mouth daily. 30 tablet 0   methocarbamol (ROBAXIN) 500 MG tablet Take 1 tablet (500 mg total) by mouth 2 (two) times daily. 20 tablet 0   methylPREDNISolone (MEDROL) 4 MG tablet Standard 6 day taper dose 21 tablet 0   No current facility-administered medications on file prior to visit.    Review of Systems:  As per HPI- otherwise negative.   Physical Examination: There were no vitals filed  for this visit. There were no vitals filed for this visit. There is no height or weight on file to calculate BMI. Ideal Body Weight:   Patient notes her pulse is 86, oxygen saturation 98%.  She is not able to check her blood pressure Patient observed via video monitor.  She looks well, somewhat fatigued but not acutely ill.  No distress or shortness of breath is noted  Assessment and Plan: COVID-19 - Plan: nirmatrelvir/ritonavir EUA (PAXLOVID) TABS, albuterol (VENTOLIN HFA) 108 (90 Base) MCG/ACT inhaler  Virtual visit today for COVID-19.  She is vaccinated, but she does have asthma.  Patient is symptomatic with fatigue and cough.  She is interested in using antivirals.  I started her on Paxlovid, refilled her albuterol inhaler to use as needed.  She is advised to rest, drink plenty of fluids.  If in any distress, if oxygen sats less than 90 or if she does not doing okay she is to be seen at the ER.  We also discussed quarantine protocol  Video used for duration of visit today  Signed Lamar Blinks, MD

## 2020-12-28 ENCOUNTER — Telehealth: Payer: Self-pay

## 2020-12-28 NOTE — Telephone Encounter (Signed)
Nurse Assessment Nurse: Marina Gravel, RN, Carmelia Bake Date/Time Eilene Ghazi Time): 12/27/2020 5:34:01 PM Confirm and document reason for call. If symptomatic, describe symptoms. ---Caller and spouse have tested positive for covid today, they are asking when can their child come back home since she does not have symptoms and tested negative / caller asking do they need to give the child something for preventative before coming home. Symptoms started Tuesday. Had Virtual Appt with MD today but forgot to ask about when her daughter could come home. Denies need for triage at this time Does the patient have any new or worsening symptoms? ---No Please document clinical information provided and list any resource used. ---Advised pt of the current CDC guidelines regarding COVID. Advised her that it would ultimately be up to her when her daughter comes home. Verbal understanding noted Disp. Time Eilene Ghazi Time) Disposition Final User 12/27/2020 5:40:11 PM Clinical Call Yes Marina Gravel, RN, Carmelia Bake

## 2021-01-02 ENCOUNTER — Telehealth: Payer: Self-pay

## 2021-01-02 NOTE — Telephone Encounter (Signed)
Left vm to return call.    

## 2021-01-02 NOTE — Telephone Encounter (Signed)
Patient Name: Lorraine Conrad Gender: Female DOB: [birth year] Age: 50 Y 74 M 18 D Return Phone Number: [phone] (Primary), [phone] (Secondary) Address: City/ State/ Zip: [place]   [address] Client [facility] - Client Client Site [facility] - Day Physician Saguier, Percell Miller - [place] Contact Type Call Who Is Calling Patient / Member / Family / Caregiver Call Type Triage / Clinical Relationship To Patient Self Return Phone Number [phone] (Primary) Chief Complaint Cough Reason for Call Symptomatic / Request for [name] states she has a question. She had a video conference with Dr. Lorelei Pont, and she had some medication sent out. She took her last dose today. Yesterday, she felt like she was coughing. She is wondering if she has to worry about her previous sx coming back. She needs advice Additional Comment She is coughing still. Translation No Nurse Assessment Nurse: Evern Bio, RN, Coralyn Mark Date/Time (Eastern Time): 01/01/2021 3:34:20 PM Confirm and document reason for call. If symptomatic, describe symptoms. ---Caller states concern over COVID relapse after completing Paxlovid Tx. States only current Sx is cough. Children have been away from home while caller and husband isolate and Tx for infection. Does the patient have any new or worsening symptoms? ---No Disp. Time Eilene Ghazi Time) Disposition Final User 01/01/2021 3:41:58 PM Clinical Call Yes Bogard, RN, Coralyn Mark

## 2021-01-07 ENCOUNTER — Other Ambulatory Visit: Payer: Self-pay | Admitting: Medical

## 2021-01-07 DIAGNOSIS — U071 COVID-19: Secondary | ICD-10-CM

## 2021-01-11 DIAGNOSIS — M25571 Pain in right ankle and joints of right foot: Secondary | ICD-10-CM | POA: Diagnosis not present

## 2021-01-17 ENCOUNTER — Ambulatory Visit (HOSPITAL_BASED_OUTPATIENT_CLINIC_OR_DEPARTMENT_OTHER)
Admission: RE | Admit: 2021-01-17 | Discharge: 2021-01-17 | Disposition: A | Discharge status: Home / Self Care | Payer: BC Managed Care – PPO | Source: Ambulatory Visit | Attending: Medical | Admitting: Medical

## 2021-01-17 ENCOUNTER — Telehealth (INDEPENDENT_AMBULATORY_CARE_PROVIDER_SITE_OTHER): Payer: BC Managed Care – PPO | Admitting: Medical

## 2021-01-17 ENCOUNTER — Other Ambulatory Visit: Payer: Self-pay

## 2021-01-17 ENCOUNTER — Encounter: Payer: Self-pay | Admitting: Medical

## 2021-01-17 VITALS — HR 93

## 2021-01-17 DIAGNOSIS — Z8616 Personal history of COVID-19: Secondary | ICD-10-CM

## 2021-01-17 DIAGNOSIS — R059 Cough, unspecified: Secondary | ICD-10-CM

## 2021-01-17 DIAGNOSIS — R062 Wheezing: Secondary | ICD-10-CM | POA: Diagnosis not present

## 2021-01-17 DIAGNOSIS — R0981 Nasal congestion: Secondary | ICD-10-CM | POA: Diagnosis not present

## 2021-01-17 MED ORDER — BENZONATATE 100 MG PO CAPS: 100.0000 mg | ORAL_CAPSULE | Freq: Three times a day (TID) | ORAL | 0 refills | Status: DC | PRN

## 2021-01-17 MED ORDER — BUDESONIDE-FORMOTEROL FUMARATE 160-4.5 MCG/ACT IN AERO
INHALATION_SPRAY | RESPIRATORY_TRACT | 1 refills | Status: DC
Start: 2021-01-17 — End: 2021-07-10

## 2021-01-17 MED ORDER — DOXYCYCLINE HYCLATE 100 MG PO TABS: 100.0000 mg | ORAL_TABLET | Freq: Two times a day (BID) | ORAL | 0 refills | Status: DC

## 2021-01-17 MED ORDER — FLUTICASONE PROPIONATE 50 MCG/ACT NA SUSP: 2.0000 | Freq: Every day | NASAL | 1 refills | Status: DC

## 2021-01-17 NOTE — Progress Notes (Signed)
Subjective:    Patient ID: Lorraine Conrad, female    DOB: [birth year], 50 y.o.   MRN: [id number]  HPI   Virtual Visit via Video Note  I connected with Lorraine Conrad on 01/17/21 at 10:20 AM EDT by a video enabled telemedicine application and verified that I am speaking with the correct person using two identifiers.  Location: Patient: home Provider: office   I discussed the limitations of evaluation and management by telemedicine and the availability of in person appointments. The patient expressed understanding and agreed to proceed.  History of Present Illness: Pt had covid infection on 12/25/2020.   Treated on 12/27/20. Dr. Edilia Bo did VV.  COVID-19 - Plan: nirmatrelvir/ritonavir EUA (PAXLOVID) TABS, albuterol (VENTOLIN HFA) 108 (90 Base) MCG/ACT inhaler   "Virtual visit today for COVID-19.  She is vaccinated, but she does have asthma.  Patient is symptomatic with fatigue and cough.  She is interested in using antivirals.  I started her on Paxlovid, refilled her albuterol inhaler to use as needed.  She is advised to rest, drink plenty of fluids.  If in any distress, if oxygen sats less than 90 or if she does not doing okay she is to be seen at the ER.  We also discussed quarantine protocol"  Pt states she got progressively better. But she noted cough returned after brief resolution. Pt is coughing up some mucus. Having some mild wheezing.  No fever, no chills, no sweats or bodyaches.  Planning to travel/fly next week.   Pt is having to use albuterol 1-2 time daily.  Pt has some residual nasal congestion.  Ocasional with activity hr 122. Most of time less than 100. No chest pain or palpitation.  Observations/Objective: General-no acute distress, pleasant, oriented. Lungs- on inspection lungs appear unlabored. Neck- no tracheal deviation or jvd on inspection. Neuro- gross motor function appears intact.    Assessment and Plan:  Patient has some recent intermittent  productive cough, nasal congestion and wheezing post-COVID infection.  Describes brief resolution of symptoms then mild recurrence of some of symptoms.  Patient did take a course of Paxlovid.  She does have history of wheezing and gets bronchitis intermittently.   Recommended she get a chest x-ray today.  Prescribed Flonase for nasal congestion and benzonatate for cough.  For recent wheezing added Symbicort inhaler . Prescription of doxycycline sent to patient's pharmacy.  Planning to fly to [place] 1 week from today. Pt concerned about ear pressure during flight. Continue flonase and can add afrin nasal spray 30 minutes before take off.  Follow up in 7-10 days or sooner if needed Follow Up Instructions:    I discussed the assessment and treatment plan with the patient. The patient was provided an opportunity to ask questions and all were answered. The patient agreed with the plan and demonstrated an understanding of the instructions.   The patient was advised to call back or seek an in-person evaluation if the symptoms worsen or if the condition fails to improve as anticipated.   Time spent with patient today was  25 minutes which consisted of chart review, discussing diagnosis, work up treatment and documentation.   Mackie Pai, PA-C    Review of Systems  Constitutional:  Negative for chills, fatigue and fever.  HENT:  Positive for congestion. Negative for facial swelling, hearing loss and mouth sores.   Respiratory:  Negative for cough, chest tightness, shortness of breath and wheezing.   Cardiovascular:  Negative for chest pain and palpitations.  Gastrointestinal:  Negative for abdominal pain, blood in stool, diarrhea and nausea.  Musculoskeletal:  Negative for back pain.  Neurological:  Negative for dizziness, seizures and headaches.  Hematological:  Negative for adenopathy. Does not bruise/bleed easily.  Psychiatric/Behavioral:  Negative for behavioral problems and  confusion.     Past Medical History:  Diagnosis Date   Allergy    SEASONAL   Anemia    Arthritis    "spine" (03/01/2014)   Asthma    Chronic lower back pain    Endometriosis    Erosive gastritis    External hemorrhoids    GERD (gastroesophageal reflux disease)    H/O shortness of breath 2014   Cardiopulmonary exercise test results   Headache    "probably monthly" (03/01/2014)   Migraine 1998; 02/2014   "this one's lasted 10 days straight" (03/01/2014)   OSA on CPAP    Ovarian cyst, left    Proctalgia fugax    Sleep apnea    on cpap   Thyroid disease    thyroid nodule     Social History   Socioeconomic History   Marital status: Married    Spouse name: Not on file   Number of children: 2   Years of education: Not on file   Highest education level: Not on file  Occupational History   Occupation: homemaker  Tobacco Use   Smoking status: Never   Smokeless tobacco: Never   Tobacco comments:    father smoked in house growing up per pt.   Vaping Use   Vaping Use: Never used  Substance and Sexual Activity   Alcohol use: Yes    Alcohol/week: 0.0 standard drinks    Comment: 12-23-16 "I'll have 1-2 drinks maybe 3-4 times/yr"   Drug use: No   Sexual activity: Yes    Birth control/protection: Surgical  Other Topics Concern   Not on file  Social History Narrative   Daily caffiene use: 6 cups per day   Patient does not get regular excercise   Social Determinants of Health   Financial Resource Strain: Not on file  Food Insecurity: Not on file  Transportation Needs: Not on file  Physical Activity: Not on file  Stress: Not on file  Social Connections: Not on file  Intimate Partner Violence: Not on file    Past Surgical History:  Procedure Laterality Date   ABDOMINAL HYSTERECTOMY  04/2013   BREAST BIOPSY Bilateral 10/05/2000   BREAST BIOPSY Right 08/26/2019   Fibroadenoma   BREAST EXCISIONAL BIOPSY Left 1991   CESAREAN SECTION  2002; 2006   COLONOSCOPY      LAPAROSCOPIC OVARIAN CYSTECTOMY  ~ [place]  ~ [place] Right 2015   TEMPOROMANDIBULAR JOINT SURGERY  ~ 1992   UPPER GASTROINTESTINAL ENDOSCOPY      Family History  Problem Relation Age of Onset   Prostate cancer Paternal Grandfather    Colon polyps Paternal Grandfather    Colon polyps Father    Heart disease Father    Diverticulitis Mother    Colon polyps Paternal Grandmother    Breast cancer Paternal Grandmother    Colon cancer Neg Hx    Esophageal cancer Neg Hx    Rectal cancer Neg Hx    Stomach cancer Neg Hx    Pancreatic cancer Neg Hx     Allergies  Allergen Reactions   Promethazine Hcl Other (See Comments)    Violent tremors    Cephalexin Diarrhea and Itching  Codeine Other (See Comments)    Was a child when she took this.   Dilaudid [Hydromorphone] Nausea And Vomiting   Soy Allergy Itching   Azithromycin Rash    Head to toe rash    Current Outpatient Medications on File Prior to Visit  Medication Sig Dispense Refill   albuterol (VENTOLIN HFA) 108 (90 Base) MCG/ACT inhaler INHALE 2 PUFFS INTO THE LUNGS EVERY 6 HOURS AS NEEDED FOR WHEEZING/SHORNTESS OF BREATH 8.5 each 3   atorvastatin (LIPITOR) 10 MG tablet Take 1 tablet (10 mg total) by mouth daily. 30 tablet 3   azelastine (ASTELIN) 0.1 % nasal spray Place 2 sprays into both nostrils 2 (two) times daily. Use in each nostril as directed (Patient not taking: Reported on 06/15/2020) 30 mL 3   conjugated estrogens (PREMARIN) vaginal cream as needed     fluticasone (FLONASE) 50 MCG/ACT nasal spray Place 2 sprays into both nostrils daily. 16 g 1   HYDROcodone-acetaminophen (NORCO) 5-325 MG tablet Take 1 tablet by mouth every 6 (six) hours as needed for moderate pain or severe pain. 8 tablet 0   hydrOXYzine (ATARAX/VISTARIL) 10 MG tablet Take 1 tablet (10 mg total) by mouth 3 (three) times daily as needed for itching. 30 tablet 0   loratadine (CLARITIN) 10 MG tablet Take 1 tablet (10 mg  total) by mouth daily. 30 tablet 0   methocarbamol (ROBAXIN) 500 MG tablet Take 1 tablet (500 mg total) by mouth 2 (two) times daily. 20 tablet 0   methylPREDNISolone (MEDROL) 4 MG tablet Standard 6 day taper dose 21 tablet 0   No current facility-administered medications on file prior to visit.    Pulse 93   LMP 02/08/2013   SpO2 96%       Objective:   Physical Exam        Assessment & Plan:

## 2021-01-17 NOTE — Patient Instructions (Addendum)
Patient has some recent intermittent productive cough, nasal congestion and wheezing post-COVID infection.  Describes brief resolution of symptoms then mild recurrence of some of symptoms.  Patient did take a course of Paxlovid.  She does have history of wheezing/asthma and gets bronchitis intermittently.   Recommended she get a chest x-ray today.  Prescribed Flonase for nasal congestion and benzonatate for cough.  For recent wheezing added Symbicort inhaler . Prescription of doxycycline sent to patient's pharmacy.  Planning to fly to [place] 1 week from today. Pt concerned about ear pressure during flight. Continue flonase and can add afrin nasal spray 30 minutes before take off.  Follow up in 7-10 days or sooner if needed

## 2021-01-31 DIAGNOSIS — M25571 Pain in right ankle and joints of right foot: Secondary | ICD-10-CM | POA: Diagnosis not present

## 2021-02-12 DIAGNOSIS — M79671 Pain in right foot: Secondary | ICD-10-CM | POA: Diagnosis not present

## 2021-02-13 ENCOUNTER — Ambulatory Visit
Admission: RE | Admit: 2021-02-13 | Discharge: 2021-02-13 | Disposition: A | Discharge status: Home / Self Care | Payer: BC Managed Care – PPO | Source: Ambulatory Visit | Attending: Obstetrics and Gynecology | Admitting: Obstetrics and Gynecology

## 2021-02-13 ENCOUNTER — Other Ambulatory Visit: Payer: Self-pay

## 2021-02-13 DIAGNOSIS — Z1231 Encounter for screening mammogram for malignant neoplasm of breast: Secondary | ICD-10-CM | POA: Diagnosis not present

## 2021-02-14 ENCOUNTER — Telehealth: Payer: Self-pay | Admitting: Pulmonary Disease

## 2021-02-14 DIAGNOSIS — G4733 Obstructive sleep apnea (adult) (pediatric): Secondary | ICD-10-CM

## 2021-02-15 DIAGNOSIS — M79671 Pain in right foot: Secondary | ICD-10-CM | POA: Diagnosis not present

## 2021-02-15 NOTE — Telephone Encounter (Signed)
I have called the pt and LM on VM for her to call the office back.    She has not been seen in the office since 2020 by VS>  VS please advise if you are willing to order the new cpap machine she is requesting via [facility].  She does not have a pending appt either.   Thanks

## 2021-02-18 NOTE — Telephone Encounter (Signed)
It looks like she had her sleep study in August 2019 and order for new CPAP machine placed in September 2019.  Can you confirm with [facility] if they told patient she needs a new CPAP machine, or if she needs a new order for CPAP supplies since she hasn't been seen in office since August 2020.

## 2021-02-19 NOTE — Telephone Encounter (Signed)
LMTCB for Lorraine Conrad at [facility]

## 2021-03-06 NOTE — Telephone Encounter (Signed)
Dr. Halford Chessman, please see message received after pt called office back.

## 2021-03-06 NOTE — Telephone Encounter (Signed)
Patient states needs order for new CPAP machine. Patient uses [facility] for CPAP machine. Patient phone number is [phone].

## 2021-03-06 NOTE — Telephone Encounter (Signed)
Order has been placed for the DME for the pt to get a new cpap machine.

## 2021-03-06 NOTE — Telephone Encounter (Signed)
Okay to send order to arrange for new auto CPAP range 5 to 15 cm H2O with heated humidity.  She needs ROV 3 months after getting new machine.

## 2021-03-14 ENCOUNTER — Other Ambulatory Visit: Payer: Self-pay | Admitting: Medical

## 2021-03-20 DIAGNOSIS — M79671 Pain in right foot: Secondary | ICD-10-CM | POA: Diagnosis not present

## 2021-03-21 ENCOUNTER — Telehealth: Payer: Self-pay | Admitting: Pulmonary Disease

## 2021-03-21 NOTE — Telephone Encounter (Signed)
Pt not seen since 2020 and needs appt  LMTCB for the pt

## 2021-03-26 ENCOUNTER — Other Ambulatory Visit: Payer: Self-pay

## 2021-03-26 ENCOUNTER — Emergency Department (HOSPITAL_BASED_OUTPATIENT_CLINIC_OR_DEPARTMENT_OTHER): Payer: BC Managed Care – PPO

## 2021-03-26 ENCOUNTER — Encounter (HOSPITAL_BASED_OUTPATIENT_CLINIC_OR_DEPARTMENT_OTHER): Payer: Self-pay | Admitting: Emergency Medicine

## 2021-03-26 ENCOUNTER — Emergency Department (HOSPITAL_BASED_OUTPATIENT_CLINIC_OR_DEPARTMENT_OTHER)
Admission: EM | Admit: 2021-03-26 | Discharge: 2021-03-26 | Disposition: A | Discharge status: Home / Self Care | Payer: BC Managed Care – PPO | Attending: Emergency Medicine | Admitting: Emergency Medicine

## 2021-03-26 DIAGNOSIS — S8992XA Unspecified injury of left lower leg, initial encounter: Secondary | ICD-10-CM | POA: Diagnosis not present

## 2021-03-26 DIAGNOSIS — X58XXXA Exposure to other specified factors, initial encounter: Secondary | ICD-10-CM | POA: Diagnosis not present

## 2021-03-26 DIAGNOSIS — S86812A Strain of other muscle(s) and tendon(s) at lower leg level, left leg, initial encounter: Secondary | ICD-10-CM

## 2021-03-26 DIAGNOSIS — S86912A Strain of unspecified muscle(s) and tendon(s) at lower leg level, left leg, initial encounter: Secondary | ICD-10-CM | POA: Diagnosis not present

## 2021-03-26 DIAGNOSIS — M79605 Pain in left leg: Secondary | ICD-10-CM | POA: Insufficient documentation

## 2021-03-26 DIAGNOSIS — Z7951 Long term (current) use of inhaled steroids: Secondary | ICD-10-CM | POA: Insufficient documentation

## 2021-03-26 DIAGNOSIS — J45909 Unspecified asthma, uncomplicated: Secondary | ICD-10-CM | POA: Insufficient documentation

## 2021-03-26 DIAGNOSIS — M79662 Pain in left lower leg: Secondary | ICD-10-CM | POA: Diagnosis not present

## 2021-03-26 NOTE — ED Notes (Signed)
Woke up with pain to back of left calf last night.  Pain worse with ambulation or to touch

## 2021-03-26 NOTE — Telephone Encounter (Signed)
Lmtcb for pt.  Pt needs OV.

## 2021-03-26 NOTE — ED Provider Notes (Signed)
[facility] DEPT Provider Note   CSN: [id number] Arrival date & time: 03/26/21  1151     History Chief Complaint  Patient presents with   Leg Pain    Lorraine Conrad is a 50 y.o. female.  The history is provided by the patient.  Leg Pain Lower extremity pain location: left calf. Pain details:    Quality:  Aching   Radiates to:  Does not radiate   Severity:  Mild   Onset quality:  Gradual   Timing:  Intermittent   Progression:  Waxing and waning Chronicity:  New Relieved by:  Rest Worsened by:  Bearing weight and activity Associated symptoms: no back pain, no decreased ROM, no fatigue, no fever, no itching, no muscle weakness, no neck pain, no numbness, no stiffness, no swelling and no tingling       Past Medical History:  Diagnosis Date   Allergy    SEASONAL   Anemia    Arthritis    "spine" (03/01/2014)   Asthma    Chronic lower back pain    Endometriosis    Erosive gastritis    External hemorrhoids    GERD (gastroesophageal reflux disease)    H/O shortness of breath 2014   Cardiopulmonary exercise test results   Headache    "probably monthly" (03/01/2014)   Migraine 1998; 02/2014   "this one's lasted 10 days straight" (03/01/2014)   OSA on CPAP    Ovarian cyst, left    Proctalgia fugax    Sleep apnea    on cpap   Thyroid disease    thyroid nodule    Patient Active Problem List   Diagnosis Date Noted   Thrombocytosis 07/09/2020   Polyneuropathy 07/03/2020   Dysphasia 10/12/2017   Globus pharyngeus 10/12/2017   Paresthesias 04/14/2017   Cervical radiculopathy 12/29/2016   Lumbar radiculopathy 93/71/6967   Diastolic dysfunction 89/38/1017   Mass of ovary 07/12/2015   History of laparoscopy 06/19/2015   Left hip pain 05/24/2015   Left knee pain 05/24/2015   Left ankle pain 05/24/2015   Chest wall pain 05/16/2015   OSA (obstructive sleep apnea) 03/01/2015   Leukocytosis 03/01/2015   Leg weakness, bilateral 03/01/2015    B12 deficiency 03/01/2015   Allergic rhinitis 12/12/2014   Dyspnea 12/12/2014   SOB (shortness of breath) 10/30/2014   Migraine 07/10/2014   Hemorrhoid 07/10/2014   H/O: hysterectomy 07/05/2014   Arthralgia 05/08/2014   Tachycardia 03/29/2014   Tinnitus 03/27/2014   Fatigue 03/27/2014   Back pain 03/27/2014   Shingles 03/09/2014   Fever 03/09/2014   Hot flashes 03/09/2014   Meningitis, viral 03/02/2014   Headache 03/01/2014   Myalgia and myositis 02/23/2014   Headache(784.0) 02/23/2014   Endometriosis 02/04/2014   Routine general medical examination at a health care facility 01/16/2014   Psoriasis 01/16/2014   Obesity (BMI 35.0-39.9 without comorbidity) 01/16/2014   Neck pain 01/01/2014   IC (interstitial cystitis) 11/14/2013   Dysuria 10/11/2013   Abnormal CBC 10/11/2013   Watery eyes 10/11/2013   Plantar fasciitis of left foot 03/18/2013   IBS (irritable bowel syndrome) 03/18/2013   Nausea alone 02/17/2013   PLEURISY 11/10/2008   GASTRITIS 08/10/2008   ANXIETY 01/06/2008   PROCTALGIA FUGAX 01/06/2008   ABDOMINAL PAIN-MULTIPLE SITES 01/06/2008   GERD 11/15/2007   CONSTIPATION 11/15/2007   Chest pain 11/15/2007   OVARIAN CYST, LEFT 11/11/2007    Past Surgical History:  Procedure Laterality Date   ABDOMINAL HYSTERECTOMY  04/2013   BREAST BIOPSY  Bilateral 10/05/2000   BREAST BIOPSY Right 08/26/2019   Fibroadenoma   BREAST EXCISIONAL BIOPSY Left 1991   CESAREAN SECTION  2002; 2006   COLONOSCOPY     LAPAROSCOPIC OVARIAN CYSTECTOMY  ~ 1999   NASAL SEPTUM SURGERY  ~ 1991   OOPHORECTOMY Right 2015   TEMPOROMANDIBULAR JOINT SURGERY  ~ 1992   UPPER GASTROINTESTINAL ENDOSCOPY       OB History   No obstetric history on file.     Family History  Problem Relation Age of Onset   Prostate cancer Paternal Grandfather    Colon polyps Paternal Grandfather    Colon polyps Father    Heart disease Father    Diverticulitis Mother    Colon polyps Paternal Grandmother     Breast cancer Paternal Grandmother    Colon cancer Neg Hx    Esophageal cancer Neg Hx    Rectal cancer Neg Hx    Stomach cancer Neg Hx    Pancreatic cancer Neg Hx     Social History   Tobacco Use   Smoking status: Never   Smokeless tobacco: Never   Tobacco comments:    father smoked in house growing up per pt.   Vaping Use   Vaping Use: Never used  Substance Use Topics   Alcohol use: Yes    Alcohol/week: 0.0 standard drinks    Comment: 12-23-16 "I'll have 1-2 drinks maybe 3-4 times/yr"   Drug use: No    Home Medications Prior to Admission medications   Medication Sig Start Date End Date Taking? Authorizing Provider  budesonide-formoterol (SYMBICORT) 160-4.5 MCG/ACT inhaler 2 inh twice 01/17/21  Yes Saguier, Percell Miller, PA-C  albuterol (VENTOLIN HFA) 108 (90 Base) MCG/ACT inhaler INHALE 2 PUFFS INTO THE LUNGS EVERY 6 HOURS AS NEEDED FOR WHEEZING/SHORNTESS OF BREATH 01/07/21   Saguier, Percell Miller, PA-C  atorvastatin (LIPITOR) 10 MG tablet Take 1 tablet (10 mg total) by mouth daily. Patient not taking: No sig reported 06/18/20   Saguier, Percell Miller, PA-C  azelastine (ASTELIN) 0.1 % nasal spray Place 2 sprays into both nostrils 2 (two) times daily. Use in each nostril as directed Patient not taking: No sig reported 03/12/16   Saguier, Percell Miller, PA-C  benzonatate (TESSALON) 100 MG capsule Take 1 capsule (100 mg total) by mouth 3 (three) times daily as needed for cough. Patient not taking: No sig reported 01/17/21   Saguier, Percell Miller, PA-C  doxycycline (VIBRA-TABS) 100 MG tablet Take 1 tablet (100 mg total) by mouth 2 (two) times daily. Patient not taking: No sig reported 01/17/21   Saguier, Percell Miller, PA-C  fluticasone [facility]) 50 MCG/ACT nasal spray SPRAY 2 SPRAYS INTO EACH NOSTRIL EVERY DAY 03/14/21   Saguier, Percell Miller, PA-C  HYDROcodone-acetaminophen (NORCO) 5-325 MG tablet Take 1 tablet by mouth every 6 (six) hours as needed for moderate pain or severe pain. Patient not taking: No sig reported 05/14/20    Saguier, Percell Miller, PA-C  hydrOXYzine (ATARAX/VISTARIL) 10 MG tablet Take 1 tablet (10 mg total) by mouth 3 (three) times daily as needed for itching. Patient not taking: No sig reported 03/12/16   Saguier, Percell Miller, PA-C  loratadine (CLARITIN) 10 MG tablet Take 1 tablet (10 mg total) by mouth daily. 04/24/16   Saguier, Percell Miller, PA-C  methocarbamol (ROBAXIN) 500 MG tablet Take 1 tablet (500 mg total) by mouth 2 (two) times daily. Patient not taking: Reported on 03/26/2021 06/22/20   Saguier, Percell Miller, PA-C  methylPREDNISolone (MEDROL) 4 MG tablet Standard 6 day taper dose Patient not taking: Reported on 03/26/2021 05/08/20  Saguier, Percell Miller, PA-C    Allergies    Promethazine hcl, Cephalexin, Codeine, Dilaudid [hydromorphone], Soy allergy, and Azithromycin  Review of Systems   Review of Systems  Constitutional:  Negative for fatigue and fever.  Musculoskeletal:  Positive for gait problem and myalgias. Negative for arthralgias, back pain, joint swelling, neck pain, neck stiffness and stiffness.  Skin:  Negative for itching and wound.  Neurological:  Negative for weakness and numbness.   Physical Exam Updated Vital Signs BP (!) 148/99 (BP Location: Right Arm)   Pulse 70   Temp 98.7 F (37.1 C)   Resp 16   Ht 5\' 3"  (1.6 m)   Wt 98.8 kg   LMP 02/08/2013   SpO2 100%   BMI 38.58 kg/m   Physical Exam Constitutional:      General: She is not in acute distress.    Appearance: She is not ill-appearing.  Cardiovascular:     Pulses: Normal pulses.  Musculoskeletal:        General: Tenderness present. No swelling or deformity. Normal range of motion.     Comments: Tenderness to palpation to the left calf muscle  Skin:    General: Skin is warm.     Capillary Refill: Capillary refill takes less than 2 seconds.     Findings: No erythema.  Neurological:     General: No focal deficit present.     Mental Status: She is alert.     Sensory: No sensory deficit.     Motor: No weakness.    ED  Results / Procedures / Treatments   Labs (all labs ordered are listed, but only abnormal results are displayed) Labs Reviewed - No data to display  EKG None  Radiology US Venous Img Lower  Left (DVT Study)  Result Date: 03/26/2021 CLINICAL DATA:  Left lower extremity pain. EXAM: LEFT LOWER EXTREMITY VENOUS DOPPLER ULTRASOUND TECHNIQUE: Gray-scale sonography with graded compression, as well as color Doppler and duplex ultrasound were performed to evaluate the lower extremity deep venous systems from the level of the common femoral vein and including the common femoral, femoral, profunda femoral, popliteal and calf veins including the posterior tibial, peroneal and gastrocnemius veins when visible. The superficial great saphenous vein was also interrogated. Spectral Doppler was utilized to evaluate flow at rest and with distal augmentation maneuvers in the common femoral, femoral and popliteal veins. COMPARISON:  None. FINDINGS: Contralateral Common Femoral Vein: Respiratory phasicity is normal and symmetric with the symptomatic side. No evidence of thrombus. Normal compressibility. Common Femoral Vein: No evidence of thrombus. Normal compressibility, respiratory phasicity and response to augmentation. Saphenofemoral Junction: No evidence of thrombus. Normal compressibility and flow on color Doppler imaging. Profunda Femoral Vein: No evidence of thrombus. Normal compressibility and flow on color Doppler imaging. Femoral Vein: No evidence of thrombus. Normal compressibility, respiratory phasicity and response to augmentation. Popliteal Vein: No evidence of thrombus. Normal compressibility, respiratory phasicity and response to augmentation. Calf Veins: No evidence of thrombus. Normal compressibility and flow on color Doppler imaging. Superficial Great Saphenous Vein: No evidence of thrombus. Normal compressibility. Venous Reflux:  None. Other Findings: No evidence of superficial thrombophlebitis or abnormal  fluid collection. IMPRESSION: No evidence of left lower extremity deep venous thrombosis. Electronically Signed   By: Aletta Edouard M.D.   On: 03/26/2021 13:16    Procedures Procedures   Medications Ordered in ED Medications - No data to display  ED Course  I have reviewed the triage vital signs and the nursing notes.  Pertinent  labs & imaging results that were available during my care of the patient were reviewed by me and considered in my medical decision making (see chart for details).    MDM Rules/Calculators/A&P                           Lorraine Conrad is here with left calf pain.  Normal vitals.  No fever.  Worse with ambulation.  Pain mostly in the calf muscle.  DVT study was negative.  She has normal pulses in her lower extremities.  Normal strength and sensation.  No signs of infectious process in the left lower leg.  No trauma history.  Has been doing a lot of painting in her house recently and overall suspect an overuse/muscle strain.  Recommend Tylenol, Motrin, ice, light duty.  Recommend follow-up primary care doctor.  Discharged in ED in good condition.  No concern for fracture or peripheral arterial disease.  This chart was dictated using voice recognition software.  Despite best efforts to proofread,  errors can occur which can change the documentation meaning.   Final Clinical Impression(s) / ED Diagnoses Final diagnoses:  Strain of calf muscle, left, initial encounter    Rx / DC Orders ED Discharge Orders     None        Lennice Sites, DO 03/26/21 1420

## 2021-03-26 NOTE — ED Triage Notes (Signed)
Pt arrives to ED with c/o left leg pain that woke her up from sleep today. She reports the left leg is tender. She denies selling or redness.

## 2021-03-26 NOTE — Discharge Instructions (Addendum)
Overall suspect that you have a strain of your left calf.  Recommend Tylenol and ibuprofen as needed.  Light weightbearing to your lower extremity.  Follow-up with your primary care doctor.

## 2021-03-27 NOTE — Telephone Encounter (Signed)
Noted.  Will close encounter.  

## 2021-03-28 ENCOUNTER — Encounter: Payer: Self-pay | Admitting: Primary Care

## 2021-03-28 ENCOUNTER — Ambulatory Visit (INDEPENDENT_AMBULATORY_CARE_PROVIDER_SITE_OTHER): Payer: BC Managed Care – PPO | Admitting: Primary Care

## 2021-03-28 ENCOUNTER — Other Ambulatory Visit: Payer: Self-pay

## 2021-03-28 VITALS — BP 112/74 | HR 75 | Temp 98.1°F | Ht 64.0 in | Wt 226.0 lb

## 2021-03-28 DIAGNOSIS — G4733 Obstructive sleep apnea (adult) (pediatric): Secondary | ICD-10-CM

## 2021-03-28 NOTE — Assessment & Plan Note (Addendum)
-   Hx moderate OSA. Not currently using CPAP for the last 6 months d/t machine being > 48 year old and not properly working. No compliance download available. Instructed her to bring CPAP machine and/or SD card to [facility] ([place]) to see if they can pull a download to confirm compliance. Otherwise we will need to repeat home sleep study to confirm diagnosis due to break in therapy - Advised patient to elevated head of bed at night, avoid driving if experiencing excessive daytime sleepiness and work on weight loss efforts - Placed an order for repeat home sleep study re: OSA - She will need to set up follow-up 31-90 days after getting new machine

## 2021-03-28 NOTE — Progress Notes (Signed)
@Patient  ID: Lorraine Conrad, female    DOB: 04/10/1971, 50 y.o.   MRN: [id number]  Chief Complaint  Patient presents with   Follow-up    Needs CPAP supplies-not currently wearing, needs new machine    Referring provider: Elise Benne  HPI: 50 year old female, never smoked.  Past medical history significant for obstructive sleep apnea.  Patient of Dr. Halford Chessman, last seen in our system 2020. DME company is [employer].   03/28/2021- Interim hx  Patient presenting today for an overdue follow-up/ OSA.  Patient needs an order for new CPAP 5-15cm h20, this order was placed back in September but her DME company stated that she needed an updated OV with provider. There has been no formal documentation to confirm CPAP compliance. She has been unable to use for the last 6 months. Her current machine is > 10 year ago and not currently working properly. She reports waking up gasping for air and has had witnessed apnea.    Pulmonary testing: Methacholine challenge 12/31/15 >> negative  Sleep tests:  PSG 02/04/18 >> AHI 27.9, SpO2 low 83%  Cardiac testing: Echo 09/27/15 >> EF 55 to 60%, grade 1 DD, mild MR CPST 01/11/16 >> deconditioning   Allergies  Allergen Reactions   Promethazine Hcl Other (See Comments)    Violent tremors    Cephalexin Diarrhea and Itching   Codeine Other (See Comments)    Was a child when she took this.   Dilaudid [Hydromorphone] Nausea And Vomiting   Soy Allergy Itching   Azithromycin Rash    Head to toe rash    Immunization History  Administered Date(s) Administered   Influenza-Unspecified 08/04/2015   PFIZER(Purple Top)SARS-COV-2 Vaccination 08/14/2019, 09/14/2019, 06/04/2020   Td 12/11/2014   Tdap 01/16/2014    Past Medical History:  Diagnosis Date   Allergy    SEASONAL   Anemia    Arthritis    "spine" (03/01/2014)   Asthma    Chronic lower back pain    Endometriosis    Erosive gastritis    External hemorrhoids    GERD (gastroesophageal reflux  disease)    H/O shortness of breath 2014   Cardiopulmonary exercise test results   Headache    "probably monthly" (03/01/2014)   Migraine 1998; 02/2014   "this one's lasted 10 days straight" (03/01/2014)   OSA on CPAP    Ovarian cyst, left    Proctalgia fugax    Sleep apnea    on cpap   Thyroid disease    thyroid nodule    Tobacco History: Social History   Tobacco Use  Smoking Status Never  Smokeless Tobacco Never  Tobacco Comments   father smoked in house growing up per pt.    Counseling given: Not Answered Tobacco comments: father smoked in house growing up per pt.    Outpatient Medications Prior to Visit  Medication Sig Dispense Refill   azelastine (ASTELIN) 0.1 % nasal spray Place 2 sprays into both nostrils 2 (two) times daily. Use in each nostril as directed 30 mL 3   budesonide-formoterol (SYMBICORT) 160-4.5 MCG/ACT inhaler 2 inh twice 1 each 1   fluticasone (FLONASE) 50 MCG/ACT nasal spray SPRAY 2 SPRAYS INTO EACH NOSTRIL EVERY DAY 16 mL 5   loratadine (CLARITIN) 10 MG tablet Take 1 tablet (10 mg total) by mouth daily. (Patient taking differently: Take 10 mg by mouth daily as needed.) 30 tablet 0   albuterol (VENTOLIN HFA) 108 (90 Base) MCG/ACT inhaler INHALE 2 PUFFS INTO THE LUNGS  EVERY 6 HOURS AS NEEDED FOR WHEEZING/SHORNTESS OF BREATH (Patient not taking: Reported on 03/28/2021) 8.5 each 3   atorvastatin (LIPITOR) 10 MG tablet Take 1 tablet (10 mg total) by mouth daily. (Patient not taking: No sig reported) 30 tablet 3   benzonatate (TESSALON) 100 MG capsule Take 1 capsule (100 mg total) by mouth 3 (three) times daily as needed for cough. (Patient not taking: No sig reported) 30 capsule 0   doxycycline (VIBRA-TABS) 100 MG tablet Take 1 tablet (100 mg total) by mouth 2 (two) times daily. (Patient not taking: No sig reported) 20 tablet 0   HYDROcodone-acetaminophen (NORCO) 5-325 MG tablet Take 1 tablet by mouth every 6 (six) hours as needed for moderate pain or severe  pain. (Patient not taking: No sig reported) 8 tablet 0   hydrOXYzine (ATARAX/VISTARIL) 10 MG tablet Take 1 tablet (10 mg total) by mouth 3 (three) times daily as needed for itching. (Patient not taking: No sig reported) 30 tablet 0   methocarbamol (ROBAXIN) 500 MG tablet Take 1 tablet (500 mg total) by mouth 2 (two) times daily. (Patient not taking: No sig reported) 20 tablet 0   methylPREDNISolone (MEDROL) 4 MG tablet Standard 6 day taper dose (Patient not taking: No sig reported) 21 tablet 0   No facility-administered medications prior to visit.    Review of Systems  Review of Systems  Constitutional:  Positive for fatigue.  Respiratory: Negative.    Psychiatric/Behavioral:  Positive for sleep disturbance.     Physical Exam  BP 112/74 (BP Location: Right Arm, Cuff Size: Large)   Pulse 75   Temp 98.1 F (36.7 C) (Temporal)   Ht 5\' 4"  (1.626 m)   Wt 226 lb (102.5 kg)   LMP 02/08/2013   SpO2 97%   BMI 38.79 kg/m  Physical Exam Constitutional:      Appearance: Normal appearance.  HENT:     Head: Normocephalic and atraumatic.     Mouth/Throat:     Mouth: Mucous membranes are moist.     Pharynx: Oropharynx is clear.  Cardiovascular:     Rate and Rhythm: Normal rate and regular rhythm.  Pulmonary:     Effort: Pulmonary effort is normal.     Breath sounds: Normal breath sounds.  Musculoskeletal:        General: Normal range of motion.  Skin:    General: Skin is warm and dry.  Neurological:     General: No focal deficit present.     Mental Status: She is alert and oriented to person, place, and time. Mental status is at baseline.  Psychiatric:        Mood and Affect: Mood normal.        Behavior: Behavior normal.        Thought Content: Thought content normal.        Judgment: Judgment normal.     Lab Results:  CBC    Component Value Date/Time   WBC 8.4 06/15/2020 0935   RBC 4.85 06/15/2020 0935   HGB 13.9 06/15/2020 0935   HCT 41.1 06/15/2020 0935   PLT 424.0  (H) 06/15/2020 0935   MCV 84.9 06/15/2020 0935   MCH 28.1 08/23/2015 1300   MCHC 33.7 06/15/2020 0935   RDW 13.5 06/15/2020 0935   LYMPHSABS 1.7 06/15/2020 0935   MONOABS 0.5 06/15/2020 0935   EOSABS 0.1 06/15/2020 0935   BASOSABS 0.0 06/15/2020 0935    BMET    Component Value Date/Time   NA 141 07/09/2020 1125  K 4.6 07/09/2020 1125   CL 107 07/09/2020 1125   CO2 25 07/09/2020 1125   GLUCOSE 86 07/09/2020 1125   BUN 12 07/09/2020 1125   CREATININE 0.94 07/09/2020 1125   CREATININE 0.95 03/18/2013 0849   CALCIUM 9.5 07/09/2020 1125   GFRNONAA >60 07/09/2020 1125   GFRAA >60 08/23/2015 1300    BNP    Component Value Date/Time   BNP 17.6 10/26/2015 1120    ProBNP No results found for: PROBNP  Imaging: US Venous Img Lower  Left (DVT Study)  Result Date: 03/26/2021 CLINICAL DATA:  Left lower extremity pain. EXAM: LEFT LOWER EXTREMITY VENOUS DOPPLER ULTRASOUND TECHNIQUE: Gray-scale sonography with graded compression, as well as color Doppler and duplex ultrasound were performed to evaluate the lower extremity deep venous systems from the level of the common femoral vein and including the common femoral, femoral, profunda femoral, popliteal and calf veins including the posterior tibial, peroneal and gastrocnemius veins when visible. The superficial great saphenous vein was also interrogated. Spectral Doppler was utilized to evaluate flow at rest and with distal augmentation maneuvers in the common femoral, femoral and popliteal veins. COMPARISON:  None. FINDINGS: Contralateral Common Femoral Vein: Respiratory phasicity is normal and symmetric with the symptomatic side. No evidence of thrombus. Normal compressibility. Common Femoral Vein: No evidence of thrombus. Normal compressibility, respiratory phasicity and response to augmentation. Saphenofemoral Junction: No evidence of thrombus. Normal compressibility and flow on color Doppler imaging. Profunda Femoral Vein: No evidence of  thrombus. Normal compressibility and flow on color Doppler imaging. Femoral Vein: No evidence of thrombus. Normal compressibility, respiratory phasicity and response to augmentation. Popliteal Vein: No evidence of thrombus. Normal compressibility, respiratory phasicity and response to augmentation. Calf Veins: No evidence of thrombus. Normal compressibility and flow on color Doppler imaging. Superficial Great Saphenous Vein: No evidence of thrombus. Normal compressibility. Venous Reflux:  None. Other Findings: No evidence of superficial thrombophlebitis or abnormal fluid collection. IMPRESSION: No evidence of left lower extremity deep venous thrombosis. Electronically Signed   By: Aletta Edouard M.D.   On: 03/26/2021 13:16     Assessment & Plan:   OSA (obstructive sleep apnea) - Hx moderate OSA. Not currently using CPAP for the last 6 months d/t machine being > 40 year old and not properly working. No compliance download available. Instructed her to bring CPAP machine and/or SD card to [facility] ([place] to see if they can pull a download to confirm compliance. Otherwise we will need to repeat home sleep study to confirm diagnosis due to break in therapy - Advised patient to elevated head of bed at night, avoid driving if experiencing excessive daytime sleepiness and work on weight loss efforts - Placed an order for repeat home sleep study re: OSA - She will need to set up follow-up 31-90 days after getting new [place], NP 03/28/2021

## 2021-03-28 NOTE — Patient Instructions (Addendum)
  You may be able to bring CPAP machine and/or SD card to [facility] ([place] to see if they can pull a download to confirm compliance. Otherwise we will need to repeat home sleep study to confirm diagnosis due to break in therapy  Recommendations: Elevate head of bed at night Do not drive if experiencing excessive daytime sleepiness Work on weight loss efforts   Orders: Home sleep study re: OSA  Follow-up: Once you get new CPAP machine you will need to follow-up with our office 31-90 days after you resume CPAP use for compliance check. We will then need to see you annually.

## 2021-03-28 NOTE — Progress Notes (Signed)
Reviewed and agree with assessment/plan.   Chesley Mires, MD [facility] 03/28/2021, 1:36 PM Pager:  [phone]

## 2021-03-29 ENCOUNTER — Telehealth: Payer: Self-pay | Admitting: Primary Care

## 2021-03-29 NOTE — Telephone Encounter (Signed)
I called and left a message on the VM per DPR and asked patient to call back about the sleep study if she has any questions.

## 2021-04-09 ENCOUNTER — Telehealth: Payer: Self-pay | Admitting: Primary Care

## 2021-04-10 NOTE — Telephone Encounter (Signed)
Per the telephone message dated 10/21, it looks like [facility] tried to reach out to the patient about the HST.  The HST was ordered on 03/28/21 & patient will be contacted in 8-10 weeks to be scheduled.

## 2021-04-10 NOTE — Telephone Encounter (Signed)
LMTCB

## 2021-04-10 NOTE — Telephone Encounter (Signed)
Attempted to call pt but unable to reach. Left message for her to return call. 

## 2021-04-10 NOTE — Telephone Encounter (Signed)
Left message for patient to call back  

## 2021-04-10 NOTE — Telephone Encounter (Signed)
Spoke with the pt  She states that she was told by [employer] that they need more information to fulfill her CPAP order  She states that she had order for HST but then she was told she did not need another sleep study  PCC's, can you please help with this? Thanks!

## 2021-04-25 NOTE — Telephone Encounter (Signed)
Called and spoke to pt. Informed her of the information per [place]. Pt is under the impression that she may not need a sleep study to get her CPAP. Community message sent to [employer] to look into this. Will keep message open to follow up.

## 2021-04-25 NOTE — Telephone Encounter (Signed)
See message from 04/09/21. Will close encounter.

## 2021-06-04 NOTE — Telephone Encounter (Signed)
I verified we do have pt's hst that was ordered on 10/20.  I am now calling pt's that had study ordered from 10/6-10/10.  Should be able to call pt in the next couple of weeks to schedule.  I called & left her a vm to make her aware and asked if her insurance will be changing from [facility] after the first of the year to please call and let me know.  I left her my direct #.  Nothing further needed at this time.

## 2021-06-04 NOTE — Telephone Encounter (Addendum)
Called [employer] and spoke with Melissa and was advised pt does still need a sleep study. Called and left pt a detailed message and advised her to call back if she has questions. Pts sleep study was ordered in Oct. Will forward to [facility] to make them aware pt does need sleep study.

## 2021-07-05 DIAGNOSIS — J069 Acute upper respiratory infection, unspecified: Secondary | ICD-10-CM | POA: Diagnosis not present

## 2021-07-05 DIAGNOSIS — J029 Acute pharyngitis, unspecified: Secondary | ICD-10-CM | POA: Diagnosis not present

## 2021-07-05 DIAGNOSIS — Z20822 Contact with and (suspected) exposure to covid-19: Secondary | ICD-10-CM | POA: Diagnosis not present

## 2021-07-09 NOTE — Progress Notes (Signed)
Lorraine Conrad is a 51 y.o. female here for sore throat.  History of Present Illness:   Chief Complaint  Patient presents with   Sore Throat    Pt c/o sore throat x 1 week, ears hurt, head congestion. Headache, hurts when she turns her head. Coughing and expectorating yellow/green. She went to Urgent care last Thurs told viral. Strep and COVID Neg. Home COVID yesterday Neg.    HPI  Cough On 07/05/21, pt presented to the UC with c/o sore throat, otalgia, and nasal congestion that has been onset for 2 days. During this visit, Lorraine Conrad reported she had tried taking motrin to manage her sx, but was provided with no relief. Although she had other sx going on, she found her sore throat to be the most unbearable, which led to her visit. After testing negative for strep and COVID, it was suspected that patient was suffering from a viral illness. Due to this she was instructed to use OTC cold medicines and tylenol/motrin as needed for pain.   Currently pt presents with c/o sore throat, head congestion, and otalgia which has now been onset for a week. States that following her UC visit she began using theraflu, which provided minor relief, but as time went on she felt as though it wasn't helping anymore. In addition to previous sx, she has recently developed a productive cough that causes her to expectorate yellow/green sputum. Pt does have a hx of asthma.   Denies Fever, SOB, CP, wheezing, or changes in appetite.   Past Medical History:  Diagnosis Date   Allergy    SEASONAL   Anemia    Arthritis    "spine" (03/01/2014)   Asthma    Chronic lower back pain    Endometriosis    Erosive gastritis    External hemorrhoids    GERD (gastroesophageal reflux disease)    H/O shortness of breath 2014   Cardiopulmonary exercise test results   Headache    "probably monthly" (03/01/2014)   Migraine 1998; 02/2014   "this one's lasted 10 days straight" (03/01/2014)   OSA on CPAP    Ovarian cyst, left     Proctalgia fugax    Sleep apnea    on cpap   Thyroid disease    thyroid nodule     Social History   Tobacco Use   Smoking status: Never   Smokeless tobacco: Never   Tobacco comments:    father smoked in house growing up per pt.   Vaping Use   Vaping Use: Never used  Substance Use Topics   Alcohol use: Yes    Alcohol/week: 0.0 standard drinks    Comment: 12-23-16 "I'll have 1-2 drinks maybe 3-4 times/yr"   Drug use: No    Past Surgical History:  Procedure Laterality Date   ABDOMINAL HYSTERECTOMY  04/2013   BREAST BIOPSY Bilateral 10/05/2000   BREAST BIOPSY Right 08/26/2019   Fibroadenoma   BREAST EXCISIONAL BIOPSY Left 1991   CESAREAN SECTION  2002; 2006   COLONOSCOPY     LAPAROSCOPIC OVARIAN CYSTECTOMY  ~ 1999   NASAL SEPTUM SURGERY  ~ 1991   OOPHORECTOMY Right 2015   TEMPOROMANDIBULAR JOINT SURGERY  ~ 1992   UPPER GASTROINTESTINAL ENDOSCOPY      Family History  Problem Relation Age of Onset   Prostate cancer Paternal Grandfather    Colon polyps Paternal Grandfather    Colon polyps Father    Heart disease Father    Diverticulitis Mother  Colon polyps Paternal Grandmother    Breast cancer Paternal Grandmother    Colon cancer Neg Hx    Esophageal cancer Neg Hx    Rectal cancer Neg Hx    Stomach cancer Neg Hx    Pancreatic cancer Neg Hx     Allergies  Allergen Reactions   Promethazine Hcl Other (See Comments)    Violent tremors    Cephalexin Diarrhea and Itching   Codeine Other (See Comments)    Was a child when she took this.   Dilaudid [Hydromorphone] Nausea And Vomiting   Soy Allergy Itching   Azithromycin Rash    Head to toe rash    Current Medications:   Current Outpatient Medications:    albuterol (VENTOLIN HFA) 108 (90 Base) MCG/ACT inhaler, Inhale 2 puffs into the lungs every 6 (six) hours as needed for wheezing or shortness of breath., Disp: 8 g, Rfl: 2   azelastine (ASTELIN) 0.1 % nasal spray, Place 2 sprays into both nostrils 2 (two)  times daily. Use in each nostril as directed, Disp: 30 mL, Rfl: 3   budesonide-formoterol (SYMBICORT) 160-4.5 MCG/ACT inhaler, 2 inh twice, Disp: 1 each, Rfl: 1   doxycycline (VIBRA-TABS) 100 MG tablet, Take 1 tablet (100 mg total) by mouth 2 (two) times daily for 7 days., Disp: 14 tablet, Rfl: 0   fluticasone (FLONASE) 50 MCG/ACT nasal spray, SPRAY 2 SPRAYS INTO EACH NOSTRIL EVERY DAY, Disp: 16 mL, Rfl: 5   lansoprazole (PREVACID) 30 MG capsule, Take 30 mg by mouth as needed., Disp: , Rfl:    loratadine (CLARITIN) 10 MG tablet, Take 1 tablet (10 mg total) by mouth daily. (Patient taking differently: Take 10 mg by mouth daily as needed.), Disp: 30 tablet, Rfl: 0   Review of Systems:   ROS Negative unless otherwise specified per HPI.  Vitals:   Vitals:   07/10/21 0806  BP: 114/80  Pulse: (!) 107  Temp: 98 F (36.7 C)  TempSrc: Temporal  SpO2: 96%  Weight: 222 lb (100.7 kg)  Height: 5\' 4"  (1.626 m)     Body mass index is 38.11 kg/m.  Physical Exam:   Physical Exam Vitals and nursing note reviewed.  Constitutional:      General: She is not in acute distress.    Appearance: She is well-developed. She is not ill-appearing or toxic-appearing.  HENT:     Head: Normocephalic and atraumatic.     Right Ear: Tympanic membrane and ear canal normal.     Left Ear: Tympanic membrane and ear canal normal.     Mouth/Throat:     Lips: Pink.     Mouth: Mucous membranes are moist.     Pharynx: Pharyngeal swelling present.     Tonsils: Tonsillar exudate present. 3+ on the right. 3+ on the left.  Cardiovascular:     Rate and Rhythm: Normal rate and regular rhythm.     Pulses: Normal pulses.     Heart sounds: Normal heart sounds, S1 normal and S2 normal.  Pulmonary:     Effort: Pulmonary effort is normal.     Breath sounds: Normal breath sounds.  Musculoskeletal:     Cervical back: Full passive range of motion without pain. No rigidity. Normal range of motion.  Lymphadenopathy:      Cervical: Cervical adenopathy present.  Skin:    General: Skin is warm and dry.  Neurological:     Mental Status: She is alert.     GCS: GCS eye subscore is 4. GCS  verbal subscore is 5. GCS motor subscore is 6.  Psychiatric:        Speech: Speech normal.        Behavior: Behavior normal. Behavior is cooperative.    Assessment and Plan:   Acute Cough No red flags Start doxycycline 100 mg twice daily x 7 days May trial dextromethorphan-- 12 hour delsym as needed for additional cough relief  Continue to push fluids and rest  Follow up if new/worsening symptoms or concerns occur  Asthma, mild intermittent without complications No wheezing or obvious SOB or retractions Refill on albuterol prn Follow-up with PCP as needed, consider restarting Symbicort  I,Lorraine Conrad,acting as a scribe for [employer], PA.,have documented all relevant documentation on the behalf of Inda Coke, PA,as directed by  Inda Coke, PA while in the presence of Inda Coke, [place].  I, Inda Coke, [place], have reviewed all documentation for this visit. The documentation on 07/10/21 for the exam, diagnosis, procedures, and orders are all accurate and complete.   Inda Coke, PA-C

## 2021-07-10 ENCOUNTER — Other Ambulatory Visit: Payer: Self-pay

## 2021-07-10 ENCOUNTER — Encounter: Payer: Self-pay | Admitting: Physician Assistant

## 2021-07-10 ENCOUNTER — Ambulatory Visit (INDEPENDENT_AMBULATORY_CARE_PROVIDER_SITE_OTHER): Payer: BC Managed Care – PPO | Admitting: Physician Assistant

## 2021-07-10 ENCOUNTER — Ambulatory Visit: Payer: BC Managed Care – PPO | Admitting: Family Medicine

## 2021-07-10 VITALS — BP 114/80 | HR 107 | Temp 98.0°F | Ht 64.0 in | Wt 222.0 lb

## 2021-07-10 DIAGNOSIS — J4520 Mild intermittent asthma, uncomplicated: Secondary | ICD-10-CM | POA: Diagnosis not present

## 2021-07-10 DIAGNOSIS — R051 Acute cough: Secondary | ICD-10-CM

## 2021-07-10 MED ORDER — ALBUTEROL SULFATE HFA 108 (90 BASE) MCG/ACT IN AERS: 2.0000 | INHALATION_SPRAY | Freq: Four times a day (QID) | RESPIRATORY_TRACT | 2 refills | Status: DC | PRN

## 2021-07-10 MED ORDER — DOXYCYCLINE HYCLATE 100 MG PO TABS: 100.0000 mg | ORAL_TABLET | Freq: Two times a day (BID) | ORAL | 0 refills | Status: AC

## 2021-07-10 NOTE — Patient Instructions (Addendum)
It was great to see you!  Start oral doxycycline  Trial ibuprofen for pain and inflammation in your throat  Trial 12-hour dextromethorphan (delsym is name brand -- generic is fine!) for your cough  Push fluids and get plenty of rest. Please return if you are not improving as expected, or if you have high fevers (>101.5) or difficulty swallowing or worsening productive cough.  Call clinic with questions.  I hope you start feeling better soon!

## 2021-07-29 ENCOUNTER — Encounter: Payer: Self-pay | Admitting: Primary Care

## 2021-08-05 ENCOUNTER — Other Ambulatory Visit: Payer: Self-pay

## 2021-08-05 ENCOUNTER — Ambulatory Visit: Payer: BC Managed Care – PPO

## 2021-08-05 DIAGNOSIS — G4733 Obstructive sleep apnea (adult) (pediatric): Secondary | ICD-10-CM | POA: Diagnosis not present

## 2021-08-06 ENCOUNTER — Ambulatory Visit (INDEPENDENT_AMBULATORY_CARE_PROVIDER_SITE_OTHER): Payer: BC Managed Care – PPO | Admitting: Physician Assistant

## 2021-08-06 ENCOUNTER — Telehealth: Payer: Self-pay | Admitting: Pulmonary Disease

## 2021-08-06 ENCOUNTER — Encounter: Payer: Self-pay | Admitting: Physician Assistant

## 2021-08-06 VITALS — BP 110/80 | HR 88 | Temp 98.2°F | Ht 64.0 in | Wt 217.2 lb

## 2021-08-06 DIAGNOSIS — J039 Acute tonsillitis, unspecified: Secondary | ICD-10-CM

## 2021-08-06 DIAGNOSIS — G4733 Obstructive sleep apnea (adult) (pediatric): Secondary | ICD-10-CM | POA: Diagnosis not present

## 2021-08-06 LAB — POCT RAPID STREP A (OFFICE): Rapid Strep A Screen: NEGATIVE

## 2021-08-06 MED ORDER — AMOXICILLIN-POT CLAVULANATE 875-125 MG PO TABS: 1.0000 | ORAL_TABLET | Freq: Two times a day (BID) | ORAL | 0 refills | Status: DC

## 2021-08-06 MED ORDER — FLUCONAZOLE 150 MG PO TABS: 150.0000 mg | ORAL_TABLET | Freq: Once | ORAL | 0 refills | Status: AC

## 2021-08-06 NOTE — Patient Instructions (Signed)
It was great to see you!  Ibuprofen around the clock for two to three days Fluids Start antibiotic as prescribed Try xyzal (generic is fine)  Contact a health care provider if: You have swelling in your neck that keeps getting bigger. You develop a rash, cough, or earache. You cough up a thick mucus that is green, yellow-brown, or bloody. You have pain or discomfort that does not get better with medicine. Your symptoms seem to be getting worse. You have a fever.  Get help right away if: You have new symptoms, such as vomiting, severe headache, stiff or painful neck, chest pain, or shortness of breath. You have severe throat pain, drooling, or changes in your voice. You have swelling of the neck, or the skin on the neck becomes red and tender. You have signs of dehydration, such as tiredness (fatigue), dry mouth, and decreased urination. You become increasingly sleepy, or you cannot wake up completely. Your joints become red or painful.  Take care,  Inda Coke PA-C

## 2021-08-06 NOTE — Telephone Encounter (Signed)
HST 08/05/21 >> AHI 52.2, SpO2 low 74%   Please inform her that her sleep study shows severe obstructive sleep apnea.  Please arrange for ROV with me or NP to discuss treatment options.

## 2021-08-06 NOTE — Progress Notes (Addendum)
Lorraine Conrad is a 51 y.o. female here for a sore throat.  History of Present Illness:   Chief Complaint  Patient presents with   Sore Throat    Pt c/o sore throat, nasal congestion started on Sunday. COVID test done yesterday was Negative. She is having difficulty swallowing. Using Tylenol.   Sore Throat Lorraine Conrad was previously seen about this issue on 07/10/21, where she described her throat pain to be unbearable and expressed she was also experiencing a cough and head congestion. About 5 days prior to our visit she had visited the UC prior to our visit but was told she was suffering from a viral illness after testing negative for COVID and strept. Due to this she was told to use OTC cold medicines and tylenol/ibuprofen for pain. Although she had tried theraflu and ibuprofen to manage her sx she was receiving minor relief. After further examination, I prescribed her doxycycline 100 mg twice daily x 7 days and recommended she trial dextromethorphan as needed for cough relief.   Although she was compliant with the medication and did experience relief for a couple of weeks, she is now experiencing a return of her sore throat. Lorraine Conrad states her current sore throat has been onset since two days and seemingly worsening during the night. According to pt, her throat feels as unbearably painful as it did when she first visited me on 07/10/21. Additionally she has also been experiencing slight head congestion and minimal tearing upon waking up in the morning. In an effort to manage her pain she has tried taking tylenol but this has provided minor relief. Denies fever or chills. She took a covid test at home and this was negative.  Past Medical History:  Diagnosis Date   Allergy    SEASONAL   Anemia    Arthritis    "spine" (03/01/2014)   Asthma    Chronic lower back pain    Endometriosis    Erosive gastritis    External hemorrhoids    GERD (gastroesophageal reflux disease)    H/O shortness of  breath 2014   Cardiopulmonary exercise test results   Headache    "probably monthly" (03/01/2014)   Migraine 1998; 02/2014   "this one's lasted 10 days straight" (03/01/2014)   OSA on CPAP    Ovarian cyst, left    Proctalgia fugax    Sleep apnea    on cpap   Thyroid disease    thyroid nodule     Social History   Tobacco Use   Smoking status: Never   Smokeless tobacco: Never   Tobacco comments:    father smoked in house growing up per pt.   Vaping Use   Vaping Use: Never used  Substance Use Topics   Alcohol use: Yes    Alcohol/week: 0.0 standard drinks    Comment: 12-23-16 "I'll have 1-2 drinks maybe 3-4 times/yr"   Drug use: No    Past Surgical History:  Procedure Laterality Date   ABDOMINAL HYSTERECTOMY  04/2013   BREAST BIOPSY Bilateral 10/05/2000   BREAST BIOPSY Right 08/26/2019   Fibroadenoma   BREAST EXCISIONAL BIOPSY Left 1991   CESAREAN SECTION  2002; 2006   COLONOSCOPY     LAPAROSCOPIC OVARIAN CYSTECTOMY  ~ 1999   NASAL SEPTUM SURGERY  ~ 1991   OOPHORECTOMY Right 2015   TEMPOROMANDIBULAR JOINT SURGERY  ~ 1992   UPPER GASTROINTESTINAL ENDOSCOPY      Family History  Problem Relation Age of Onset   Prostate  cancer Paternal Grandfather    Colon polyps Paternal Grandfather    Colon polyps Father    Heart disease Father    Diverticulitis Mother    Colon polyps Paternal Grandmother    Breast cancer Paternal Grandmother    Colon cancer Neg Hx    Esophageal cancer Neg Hx    Rectal cancer Neg Hx    Stomach cancer Neg Hx    Pancreatic cancer Neg Hx     Allergies  Allergen Reactions   Promethazine Hcl Other (See Comments)    Violent tremors    Cephalexin Diarrhea and Itching   Codeine Other (See Comments)    Was a child when she took this.   Dilaudid [Hydromorphone] Nausea And Vomiting   Soy Allergy Itching   Azithromycin Rash    Head to toe rash    Current Medications:   Current Outpatient Medications:    albuterol (VENTOLIN HFA) 108 (90 Base)  MCG/ACT inhaler, Inhale 2 puffs into the lungs every 6 (six) hours as needed for wheezing or shortness of breath., Disp: 8 g, Rfl: 2   amoxicillin-clavulanate (AUGMENTIN) 875-125 MG tablet, Take 1 tablet by mouth 2 (two) times daily., Disp: 14 tablet, Rfl: 0   azelastine (ASTELIN) 0.1 % nasal spray, Place 2 sprays into both nostrils 2 (two) times daily. Use in each nostril as directed, Disp: 30 mL, Rfl: 3   fluconazole (DIFLUCAN) 150 MG tablet, Take 1 tablet (150 mg total) by mouth once for 1 dose., Disp: 1 tablet, Rfl: 0   fluticasone (FLONASE) 50 MCG/ACT nasal spray, SPRAY 2 SPRAYS INTO EACH NOSTRIL EVERY DAY, Disp: 16 mL, Rfl: 5   lansoprazole (PREVACID) 30 MG capsule, Take 30 mg by mouth as needed., Disp: , Rfl:    loratadine (CLARITIN) 10 MG tablet, Take 1 tablet (10 mg total) by mouth daily. (Patient taking differently: Take 10 mg by mouth daily as needed.), Disp: 30 tablet, Rfl: 0   Review of Systems:   ROS Negative unless otherwise specified per HPI. Vitals:   Vitals:   08/06/21 1142  BP: 110/80  Pulse: 88  Temp: 98.2 F (36.8 C)  TempSrc: Temporal  SpO2: 96%  Weight: 217 lb 4 oz (98.5 kg)  Height: 5\' 4"  (1.626 m)     Body mass index is 37.29 kg/m.  Physical Exam:   Physical Exam Vitals and nursing note reviewed.  Constitutional:      General: She is not in acute distress.    Appearance: She is well-developed. She is not ill-appearing or toxic-appearing.  HENT:     Right Ear: A middle ear effusion is present.     Left Ear: A middle ear effusion is present.     Mouth/Throat:     Pharynx: Pharyngeal swelling and posterior oropharyngeal erythema present.     Tonsils: Tonsillar exudate present. 3+ on the right. 3+ on the left.  Cardiovascular:     Rate and Rhythm: Normal rate and regular rhythm.     Pulses: Normal pulses.     Heart sounds: Normal heart sounds, S1 normal and S2 normal.  Pulmonary:     Effort: Pulmonary effort is normal.     Breath sounds: Normal  breath sounds.  Skin:    General: Skin is warm and dry.  Neurological:     Mental Status: She is alert.     GCS: GCS eye subscore is 4. GCS verbal subscore is 5. GCS motor subscore is 6.  Psychiatric:  Speech: Speech normal.        Behavior: Behavior normal. Behavior is cooperative.   Strep test negative  Assessment and Plan:   Tonsillitis No red flags Start Augmentin 875-125 twice daily x 7 days Trial otc antihistamine such as Zyrtec, Claritin, Allegra, and Xyzal for possible allergy relief  May use Diflucan 150 mg, single dose, as needed for possible yeast infection due to antibiotics  Encouraged patient to push more fluids and get rest  Follow up if new/worsening symptoms or concerns occur--advised patient on worsening precautions  I,Havlyn C Ratchford,acting as a scribe for [employer], PA.,have documented all relevant documentation on the behalf of Inda Coke, PA,as directed by  Inda Coke, PA while in the presence of Inda Coke, [place].  I, Inda Coke, [place], have reviewed all documentation for this visit. The documentation on 08/06/21 for the exam, diagnosis, procedures, and orders are all accurate and complete.   Inda Coke, PA-C

## 2021-08-07 ENCOUNTER — Telehealth: Payer: Self-pay | Admitting: Medical

## 2021-08-07 NOTE — Telephone Encounter (Signed)
Left detailed message on personal voicemail Lorraine Conrad said, Yes, the augmentin antibiotic should cover for that.  ?If new/worsening symptoms, please let us know.  ?If any significant pain when moving her eyes -- needs to go to the ER. Any questions please call the office. ?

## 2021-08-07 NOTE — Telephone Encounter (Signed)
Patient stated she had white stuff coming out of her eye last night- today at 3 am she noticed her eye swollen and red. It is now itching. Pt would like to know if medication that was given to her yesterday will work with this new eye issue.  ?

## 2021-08-07 NOTE — Telephone Encounter (Signed)
Please see message and advise 

## 2021-08-07 NOTE — Telephone Encounter (Signed)
Called and notified patient of results. She voiced understanding. Made pt appt with TP on 08/12/21. Nothing further needed.  ?

## 2021-08-12 ENCOUNTER — Ambulatory Visit (INDEPENDENT_AMBULATORY_CARE_PROVIDER_SITE_OTHER): Payer: BC Managed Care – PPO | Admitting: Adult Health

## 2021-08-12 ENCOUNTER — Encounter: Payer: Self-pay | Admitting: Adult Health

## 2021-08-12 ENCOUNTER — Other Ambulatory Visit: Payer: Self-pay

## 2021-08-12 DIAGNOSIS — G4733 Obstructive sleep apnea (adult) (pediatric): Secondary | ICD-10-CM

## 2021-08-12 NOTE — Addendum Note (Signed)
Addended by: Vanessa Barbara on: 08/12/2021 10:19 AM ? ? Modules accepted: Orders ? ?

## 2021-08-12 NOTE — Patient Instructions (Addendum)
Order for new  CPAP.  ?Wear CPAP At bedtime  -all night  ?Work on healthy sleep regimen  ?Do not drive if sleepy   ?Work on healthy weight loss.  ?Follow up with Dr. Halford Chessman or Earline Stiner NP in 3 months and As needed   ? ?

## 2021-08-12 NOTE — Assessment & Plan Note (Signed)
Severe obstructive sleep apnea-needs order for new machine as her machine is not working. ?We will continue on CPAP same settings. ?[facility] was [facility] to [facility] as an [facility] order ? ?Plan  ?Patient Instructions  ?Order for new  CPAP.  ?Wear CPAP At bedtime  -all night  ?Work on healthy sleep regimen  ?Do not drive if sleepy   ?Work on healthy weight loss.  ?Follow up with Dr. Halford Chessman or Zakiya Sporrer NP in 3 months and As needed   ? ?  ? ?

## 2021-08-12 NOTE — Progress Notes (Signed)
? ?'@Patient'$  ID: Lorraine Conrad, female    DOB: 02-02-1971, 51 y.o.   MRN: [id number] ? ?Chief Complaint  ?Patient presents with  ? Follow-up  ? ? ?Referring provider: ?Saguier, Percell Miller, PA-C ? ?HPI: ?51 year old female followed for obstructive sleep apnea ? ?TEST/EVENTS :  ?Pulmonary testing: ?Methacholine challenge 12/31/15 >> negative ?  ?Sleep tests:  ?PSG 02/04/18 >> AHI 27.9, SpO2 low 83% ?HST 08/05/21 >> AHI 52.2, SpO2 low 74% ? ?  ?Cardiac testing: ?Echo 09/27/15 >> EF 55 to 60%, grade 1 DD, mild MR ?CPST 01/11/16 >> deconditioning ? ?08/12/2021 Follow up : OSA  ?Patient presents for a 73-monthfollow-up.  Patient says her CPAP machine has been getting old.  Not working properly.  She had to have a repeat sleep study.  Home sleep study was done on August 05, 2021 that showed severe sleep apnea with AHI 52.2/hour and SPO2 low of 74%.  Patient says she typically is able to wear her CPAP but recently is stopped working and she has been unable to wear it.  She has daytime sleepiness and restless sleep.  Tends to gasp in her sleep now that she does not have her CPAP.   ? ? ? ? ?Allergies  ?Allergen Reactions  ? Promethazine Hcl Other (See Comments)  ?  Violent tremors ?  ? Cephalexin Diarrhea and Itching  ? Codeine Other (See Comments)  ?  Was a child when she took this.  ? Dilaudid [Hydromorphone] Nausea And Vomiting  ? Soy Allergy Itching  ? Azithromycin Rash  ?  Head to toe rash  ? ? ?Immunization History  ?Administered Date(s) Administered  ? Influenza-Unspecified 08/04/2015  ? PFIZER(Purple Top)SARS-COV-2 Vaccination 08/14/2019, 09/14/2019, 06/04/2020  ? Td 12/11/2014  ? Tdap 01/16/2014  ? ? ?Past Medical History:  ?Diagnosis Date  ? Allergy   ? SEASONAL  ? Anemia   ? Arthritis   ? "spine" (03/01/2014)  ? Asthma   ? Chronic lower back pain   ? Endometriosis   ? Erosive gastritis   ? External hemorrhoids   ? GERD (gastroesophageal reflux disease)   ? H/O shortness of breath 2014  ? Cardiopulmonary exercise test results   ? Headache   ? "probably monthly" (03/01/2014)  ? Migraine 1998; 02/2014  ? "this one's lasted 10 days straight" (03/01/2014)  ? OSA on CPAP   ? Ovarian cyst, left   ? Proctalgia fugax   ? Sleep apnea   ? on cpap  ? Thyroid disease   ? thyroid nodule  ? ? ?Tobacco History: ?Social History  ? ?Tobacco Use  ?Smoking Status Never  ?Smokeless Tobacco Never  ?Tobacco Comments  ? father smoked in house growing up per pt.   ? ?Counseling given: Not Answered ?Tobacco comments: father smoked in house growing up per pt.  ? ? ?Outpatient Medications Prior to Visit  ?Medication Sig Dispense Refill  ? amoxicillin-clavulanate (AUGMENTIN) 875-125 MG tablet Take 1 tablet by mouth 2 (two) times daily. 14 tablet 0  ? albuterol (VENTOLIN HFA) 108 (90 Base) MCG/ACT inhaler Inhale 2 puffs into the lungs every 6 (six) hours as needed for wheezing or shortness of breath. (Patient not taking: Reported on 08/12/2021) 8 g 2  ? azelastine (ASTELIN) 0.1 % nasal spray Place 2 sprays into both nostrils 2 (two) times daily. Use in each nostril as directed (Patient not taking: Reported on 08/12/2021) 30 mL 3  ? fluticasone (FLONASE) 50 MCG/ACT nasal spray SPRAY 2 SPRAYS INTO EACH NOSTRIL EVERY DAY (  Patient not taking: Reported on 08/12/2021) 16 mL 5  ? lansoprazole (PREVACID) 30 MG capsule Take 30 mg by mouth as needed. (Patient not taking: Reported on 08/12/2021)    ? loratadine (CLARITIN) 10 MG tablet Take 1 tablet (10 mg total) by mouth daily. (Patient not taking: Reported on 08/12/2021) 30 tablet 0  ? ?No facility-administered medications prior to visit.  ? ? ? ?Review of Systems:  ? ?Constitutional:   No  weight loss, night sweats,  Fevers, chills,  ?+fatigue, or  lassitude. ? ?HEENT:   No headaches,  Difficulty swallowing,  Tooth/dental problems, or  Sore throat,  ?              No sneezing, itching, ear ache, nasal congestion, post nasal drip,  ? ?CV:  No chest pain,  Orthopnea, PND, swelling in lower extremities, anasarca, dizziness, palpitations,  syncope.  ? ?GI  No heartburn, indigestion, abdominal pain, nausea, vomiting, diarrhea, change in bowel habits, loss of appetite, bloody stools.  ? ?Resp: .  No chest wall deformity ? ?Skin: no rash or lesions. ? ?GU: no dysuria, change in color of urine, no urgency or frequency.  No flank pain, no hematuria  ? ?MS:  No joint pain or swelling.  No decreased range of motion.  No back pain. ? ? ? ?Physical Exam ? ?BP 118/80 (BP Location: Left Arm, Patient Position: Sitting, Cuff Size: Large)   Pulse (!) 105   Temp 98.3 ?F (36.8 ?C) (Oral)   Ht '5\' 4"'$  (1.626 m)   Wt 218 lb 9.6 oz (99.2 kg)   LMP 02/08/2013   SpO2 98%   BMI 37.52 kg/m?  ? ?GEN: A/Ox3; pleasant , NAD, well nourished  ?  ?HEENT:  [place]/AT,  , NOSE-clear, THROAT-clear, no lesions, no postnasal drip or exudate noted.  ?Class III MP airway ? ?NECK:  Supple w/ fair ROM; no JVD; normal carotid impulses w/o bruits; no thyromegaly or nodules palpated; no lymphadenopathy.   ? ?RESP  Clear  P & A; w/o, wheezes/ rales/ or rhonchi. no accessory muscle use, no dullness to percussion ? ?CARD:  RRR, no m/r/g, no peripheral edema, pulses intact, no cyanosis or clubbing. ? ?GI:   Soft & nt; nml bowel sounds; no organomegaly or masses detected.  ? ?Musco: Warm bil, no deformities or joint swelling noted.  ? ?Neuro: alert, no focal deficits noted.   ? ?Skin: Warm, no lesions or rashes ? ? ? ?Lab Results: ? ? ? ?ProBNP ?No results found for: PROBNP ? ?Imaging: ?No results found. ? ? ? ?PFT Results Latest Ref Rng & Units 12/31/2015  ?FVC-Pre L 2.98  ?FVC-Predicted Pre % 81  ?FVC-Post L 2.84  ?FVC-Predicted Post % 77  ?Pre FEV1/FVC % % 88  ?Post FEV1/FCV % % 89  ?FEV1-Pre L 2.62  ?FEV1-Predicted Pre % 88  ?FEV1-Post L 2.52  ? ? ?Lab Results  ?Component Value Date  ? NITRICOXIDE 16 12/25/2015  ? ? ? ? ? ? ?Assessment & Plan:  ? ?OSA (obstructive sleep apnea) ?Severe obstructive sleep apnea-needs order for new machine as her machine is not working. ?We will continue on CPAP  same settings. ?[facility] to [facility] as an [facility] order ? ?Plan  ?Patient Instructions  ?Order for new  CPAP.  ?Wear CPAP At bedtime  -all night  ?Work on healthy sleep regimen  ?Do not drive if sleepy   ?Work on healthy weight loss.  ?Follow up with Dr. Halford Chessman or Selim Durden NP  in 3 months and As needed   ? ?  ? ? ?Morbid obesity ([place]) ?Healthy weight loss discussed ? ? ? ? ?Rexene Edison, NP ?08/12/2021 ? ?

## 2021-08-12 NOTE — Assessment & Plan Note (Signed)
Healthy weight loss discussed 

## 2021-08-13 NOTE — Progress Notes (Signed)
Reviewed and agree with assessment/plan. ? ? ?Gerry Blanchfield, MD ?[facility] ?08/13/2021, 8:55 AM ?Pager:  [phone] ? ?

## 2021-08-21 DIAGNOSIS — G4733 Obstructive sleep apnea (adult) (pediatric): Secondary | ICD-10-CM | POA: Diagnosis not present

## 2021-08-23 ENCOUNTER — Ambulatory Visit (INDEPENDENT_AMBULATORY_CARE_PROVIDER_SITE_OTHER): Payer: BC Managed Care – PPO | Admitting: Physician Assistant

## 2021-08-23 ENCOUNTER — Encounter: Payer: Self-pay | Admitting: Physician Assistant

## 2021-08-23 VITALS — BP 134/82 | HR 85 | Temp 98.0°F | Ht 64.0 in | Wt 219.6 lb

## 2021-08-23 DIAGNOSIS — J309 Allergic rhinitis, unspecified: Secondary | ICD-10-CM

## 2021-08-23 MED ORDER — MONTELUKAST SODIUM 10 MG PO TABS
10.0000 mg | ORAL_TABLET | Freq: Every day | ORAL | 3 refills | Status: DC
Start: 2021-08-23 — End: 2023-05-21

## 2021-08-23 MED ORDER — AZELASTINE HCL 0.1 % NA SOLN: 1.0000 | Freq: Two times a day (BID) | NASAL | 3 refills | Status: DC

## 2021-08-23 NOTE — Patient Instructions (Signed)
It was great to see you! ? ?-Take over the counter antihistamines such as Zyrtec (cetirizine), Claritin (loratadine), Allegra (fexofenadine), or Xyzal (levocetirizine) DAILY ?-Take oral singulair 10 mg NIGHTLY ?-Nasal saline spray (i.e., Simply Saline) or nasal saline lavage (i.e., NeilMed) is recommended as needed and prior to medicated nasal sprays. ?-Astelin nasal spray in both nostrils in AM and PM ? ?Do this daily x 2 weeks and let Percell Miller (or me -- if you transfer) know how you are doing! ? ?If new symptoms, let us know. ? ?Take care, ? ?Inda Coke PA-C  ?

## 2021-08-23 NOTE — Progress Notes (Signed)
Lorraine Conrad is a 51 y.o. female here for a sore throat. ? ?History of Present Illness:  ? ?Chief Complaint  ?Patient presents with  ? Sore Throat  ?  Pt is c/o sore throat and congestion, pt is feeling better but c/o still coughing up a lot of mucus and eyes runny.  ? ? ?Sore Throat  ?Pt was previously seen about this issue by me on 08/06/21, where she explained she was experiencing unbearable throat pain. At that time she was also experiencing head congestion and minimal tearing upon waking in the morning. Following further evaluation, I prescribed her with Augmentin 875-125 twice daily x 7 days as well as recommend her to trial an OTC antihistamine for additional relief.  ? ?Unfortunately although Lorraine Conrad has noticed she has gotten better, she is still expectorating reddish brown or yellow-greenish sputum upon waking in the morning. She is also still experiencing a slight sore throat and nasal congestion. Since our prior visit, she reports she has been somewhat compliant with taking xyzal 5 mg daily. She is not using nasal sprays regularly. Does have of taking singulair. Currently she is wondering what more she could do to find additional relief. Denies fever, chills, CP, or SOB.  ? ?Past Medical History:  ?Diagnosis Date  ? Allergy   ? SEASONAL  ? Anemia   ? Arthritis   ? "spine" (03/01/2014)  ? Asthma   ? Chronic lower back pain   ? Endometriosis   ? Erosive gastritis   ? External hemorrhoids   ? GERD (gastroesophageal reflux disease)   ? H/O shortness of breath 2014  ? Cardiopulmonary exercise test results  ? Headache   ? "probably monthly" (03/01/2014)  ? Migraine 1998; 02/2014  ? "this one's lasted 10 days straight" (03/01/2014)  ? OSA on CPAP   ? Ovarian cyst, left   ? Proctalgia fugax   ? Sleep apnea   ? on cpap  ? Thyroid disease   ? thyroid nodule  ? ?  ?Social History  ? ?Tobacco Use  ? Smoking status: Never  ? Smokeless tobacco: Never  ? Tobacco comments:  ?  father smoked in house growing up per pt.    ?Vaping Use  ? Vaping Use: Never used  ?Substance Use Topics  ? Alcohol use: Yes  ?  Alcohol/week: 0.0 standard drinks  ?  Comment: 12-23-16 "I'll have 1-2 drinks maybe 3-4 times/yr"  ? Drug use: No  ? ? ?Past Surgical History:  ?Procedure Laterality Date  ? ABDOMINAL HYSTERECTOMY  04/2013  ? BREAST BIOPSY Bilateral 10/05/2000  ? BREAST BIOPSY Right 08/26/2019  ? Fibroadenoma  ? BREAST EXCISIONAL BIOPSY Left 1991  ? CESAREAN SECTION  2002; 2006  ? COLONOSCOPY    ? LAPAROSCOPIC OVARIAN CYSTECTOMY  ~ 1999  ? NASAL SEPTUM SURGERY  ~ 1991  ? OOPHORECTOMY Right 2015  ? TEMPOROMANDIBULAR JOINT SURGERY  ~ 1992  ? UPPER GASTROINTESTINAL ENDOSCOPY    ? ? ?Family History  ?Problem Relation Age of Onset  ? Prostate cancer Paternal Grandfather   ? Colon polyps Paternal Grandfather   ? Colon polyps Father   ? Heart disease Father   ? Diverticulitis Mother   ? Colon polyps Paternal Grandmother   ? Breast cancer Paternal Grandmother   ? Colon cancer Neg Hx   ? Esophageal cancer Neg Hx   ? Rectal cancer Neg Hx   ? Stomach cancer Neg Hx   ? Pancreatic cancer Neg Hx   ? ? ?  Allergies  ?Allergen Reactions  ? Promethazine Hcl Other (See Comments)  ?  Violent tremors ?  ? Cephalexin Diarrhea and Itching  ? Codeine Other (See Comments)  ?  Was a child when she took this.  ? Dilaudid [Hydromorphone] Nausea And Vomiting  ? Soy Allergy Itching  ? Azithromycin Rash  ?  Head to toe rash  ? ? ?Current Medications:  ? ?Current Outpatient Medications:  ?  albuterol (VENTOLIN HFA) 108 (90 Base) MCG/ACT inhaler, Inhale 2 puffs into the lungs every 6 (six) hours as needed for wheezing or shortness of breath., Disp: 8 g, Rfl: 2 ?  azelastine (ASTELIN) 0.1 % nasal spray, Place 2 sprays into both nostrils 2 (two) times daily. Use in each nostril as directed, Disp: 30 mL, Rfl: 3 ?  fluticasone (FLONASE) 50 MCG/ACT nasal spray, SPRAY 2 SPRAYS INTO EACH NOSTRIL EVERY DAY, Disp: 16 mL, Rfl: 5 ?  lansoprazole (PREVACID) 30 MG capsule, Take 30 mg by mouth as  needed., Disp: , Rfl:  ?  loratadine (CLARITIN) 10 MG tablet, Take 1 tablet (10 mg total) by mouth daily., Disp: 30 tablet, Rfl: 0 ?  amoxicillin-clavulanate (AUGMENTIN) 875-125 MG tablet, Take 1 tablet by mouth 2 (two) times daily. (Patient not taking: Reported on 08/23/2021), Disp: 14 tablet, Rfl: 0  ? ?Review of Systems:  ? ?ROS ?Negative unless otherwise specified per HPI. ? ?Vitals:  ? ?Vitals:  ? 08/23/21 1135  ?BP: 134/82  ?Pulse: 85  ?Temp: 98 ?F (36.7 ?C)  ?TempSrc: Temporal  ?SpO2: 98%  ?Weight: 219 lb 9.6 oz (99.6 kg)  ?Height: '5\' 4"'$  (1.626 m)  ?   ?Body mass index is 37.69 kg/m?. ? ?Physical Exam:  ? ?Physical Exam ?Vitals and nursing note reviewed.  ?Constitutional:   ?   General: She is not in acute distress. ?   Appearance: She is well-developed. She is not ill-appearing or toxic-appearing.  ?HENT:  ?   Head: Normocephalic and atraumatic.  ?   Right Ear: Tympanic membrane, ear canal and external ear normal. Tympanic membrane is not erythematous, retracted or bulging.  ?   Left Ear: Tympanic membrane, ear canal and external ear normal. Tympanic membrane is not erythematous, retracted or bulging.  ?   Nose: Nose normal.  ?   Right Sinus: No maxillary sinus tenderness or frontal sinus tenderness.  ?   Left Sinus: No maxillary sinus tenderness or frontal sinus tenderness.  ?   Mouth/Throat:  ?   Pharynx: Uvula midline. No posterior oropharyngeal erythema.  ?Eyes:  ?   General: Lids are normal.  ?   Conjunctiva/sclera: Conjunctivae normal.  ?Neck:  ?   Trachea: Trachea normal.  ?Cardiovascular:  ?   Rate and Rhythm: Normal rate and regular rhythm.  ?   Heart sounds: Normal heart sounds, S1 normal and S2 normal.  ?Pulmonary:  ?   Effort: Pulmonary effort is normal.  ?   Breath sounds: Normal breath sounds. No decreased breath sounds, wheezing, rhonchi or rales.  ?Lymphadenopathy:  ?   Cervical: No cervical adenopathy.  ?Skin: ?   General: Skin is warm and dry.  ?Neurological:  ?   Mental Status: She is  alert.  ?Psychiatric:     ?   Speech: Speech normal.     ?   Behavior: Behavior normal. Behavior is cooperative.  ? ? ?Assessment and Plan:  ? ?Allergic Rhinitis  ?No red flags  ?Start Singulair 10 mg nightly ?Continue Xyzal 5 mg daily and \ ?Restart  Astelin nasal spray 0.1 % in the AM and PM daily x 2 weeks ?Follow up if new/worsening symptoms or concerns occur  ? ?I,Lorraine Conrad,acting as a scribe for [employer], PA.,have documented all relevant documentation on the behalf of Inda Coke, PA,as directed by  Inda Coke, PA while in the presence of Inda Coke, [place]. ? ?IInda Coke, PA, have reviewed all documentation for this visit. The documentation on 08/23/21 for the exam, diagnosis, procedures, and orders are all accurate and complete. ? ? ?Inda Coke, PA-C ? ?

## 2021-08-26 ENCOUNTER — Other Ambulatory Visit: Payer: Self-pay

## 2021-08-26 ENCOUNTER — Emergency Department (HOSPITAL_BASED_OUTPATIENT_CLINIC_OR_DEPARTMENT_OTHER)
Admission: EM | Admit: 2021-08-26 | Discharge: 2021-08-26 | Disposition: A | Discharge status: Home / Self Care | Payer: BC Managed Care – PPO | Attending: Emergency Medicine | Admitting: Emergency Medicine

## 2021-08-26 ENCOUNTER — Encounter (HOSPITAL_BASED_OUTPATIENT_CLINIC_OR_DEPARTMENT_OTHER): Payer: Self-pay

## 2021-08-26 ENCOUNTER — Emergency Department (HOSPITAL_BASED_OUTPATIENT_CLINIC_OR_DEPARTMENT_OTHER): Payer: BC Managed Care – PPO

## 2021-08-26 DIAGNOSIS — M79662 Pain in left lower leg: Secondary | ICD-10-CM | POA: Insufficient documentation

## 2021-08-26 DIAGNOSIS — M79605 Pain in left leg: Secondary | ICD-10-CM

## 2021-08-26 NOTE — Discharge Instructions (Signed)
Ultrasound did not show any signs of blood clot.  Take over-the-counter medications as needed for pain.  Follow-up with your doctor next week to be rechecked if the symptoms persist ?

## 2021-08-26 NOTE — ED Triage Notes (Signed)
Pt presents with Left calf pain starting last night, feels like it is "pinching" when she walks. Pt states "I feel a little something back there when I was feeling the area of pain." Pt wants to rule out a possible blood clot. Pt denies any hx of BC, two weeks ago pt [place] and from the beach in [place], pt denies SOB and CP.  ? ?Pt ambulatory in triage  ?

## 2021-08-26 NOTE — ED Provider Notes (Signed)
?[facility] ?Provider Note ? ? ?CSN: [id number] ?Arrival date & time: 08/26/21  1628 ? ?  ? ?History ? ?Chief Complaint  ?Patient presents with  ? Leg Pain  ? ? ?Lorraine Conrad is a 51 y.o. female. ? ? ?Leg Pain ?Associated symptoms: no fever   ? ?Patient presents to the ED for evaluation of left calf pain.  Patient states the symptoms started yesterday.  She felt a pinching type discomfort in her left calf when walking.  Patient did drive to the beach a [place] weeks ago.  She is not having any chest pain or shortness of breath.  She denies any fevers or chills.  She has not noticed any redness or swelling.  She denies any recent injuries or falls. ? ?Home Medications ?Prior to Admission medications   ?Medication Sig Start Date End Date Taking? Authorizing Provider  ?albuterol (VENTOLIN HFA) 108 (90 Base) MCG/ACT inhaler Inhale 2 puffs into the lungs every 6 (six) hours as needed for wheezing or shortness of breath. 07/10/21   Inda Coke, PA  ?azelastine (ASTELIN) 0.1 % nasal spray Place 1 spray into both nostrils 2 (two) times daily. Use in each nostril as directed 08/23/21   Inda Coke, PA  ?fluticasone (FLONASE) 50 MCG/ACT nasal spray SPRAY 2 SPRAYS INTO EACH NOSTRIL EVERY DAY 03/14/21   Saguier, Percell Miller, PA-C  ?lansoprazole (PREVACID) 30 MG capsule Take 30 mg by mouth as needed.    [provider]  ?loratadine (CLARITIN) 10 MG tablet Take 1 tablet (10 mg total) by mouth daily. 04/24/16   Saguier, Percell Miller, PA-C  ?montelukast (SINGULAIR) 10 MG tablet Take 1 tablet (10 mg total) by mouth at bedtime. 08/23/21   Inda Coke, [place]  ?   ? ?Allergies    ?Promethazine hcl, Cephalexin, Codeine, Dilaudid [hydromorphone], Soy allergy, and Azithromycin   ? ?Review of Systems   ?Review of Systems  ?Constitutional:  Negative for fever.  ? ?Physical Exam ?Updated Vital Signs ?BP 129/80   Pulse 83   Temp 98.6 ?F (37 ?C)   Resp 18   LMP 02/08/2013   SpO2 99%  ?Physical  Exam ?Vitals and nursing note reviewed.  ?Constitutional:   ?   General: She is not in acute distress. ?   Appearance: She is well-developed.  ?HENT:  ?   Head: Normocephalic and atraumatic.  ?   Right Ear: External ear normal.  ?   Left Ear: External ear normal.  ?Eyes:  ?   General: No scleral icterus.    ?   Right eye: No discharge.     ?   Left eye: No discharge.  ?   Conjunctiva/sclera: Conjunctivae normal.  ?Neck:  ?   Trachea: No tracheal deviation.  ?Cardiovascular:  ?   Rate and Rhythm: Normal rate.  ?Pulmonary:  ?   Effort: Pulmonary effort is normal. No respiratory distress.  ?   Breath sounds: No stridor.  ?Abdominal:  ?   General: There is no distension.  ?Musculoskeletal:     ?   General: No swelling or deformity.  ?   Cervical back: Neck supple.  ?   Comments: Left lower leg is warm and well-perfused, palpable dorsalis pedis pulse, mild tenderness palpation left calf, no erythema or induration, no mass appreciated, full range of motion of lower leg without discomfort  ?Skin: ?   General: Skin is warm and dry.  ?   Findings: No rash.  ?Neurological:  ?   Mental Status: She  is alert.  ?   Cranial Nerves: Cranial nerve deficit: no gross deficits.  ? ? ?ED Results / Procedures / Treatments   ?Labs ?(all labs ordered are listed, but only abnormal results are displayed) ?Labs Reviewed - No data to display ? ?EKG ?None ? ?Radiology ?US Venous Img Lower Unilateral Left ? ?Result Date: 08/26/2021 ?CLINICAL DATA:  Acute left calf pain. EXAM: Left LOWER EXTREMITY VENOUS DOPPLER ULTRASOUND TECHNIQUE: Gray-scale sonography with compression, as well as color and duplex ultrasound, were performed to evaluate the deep venous system(s) from the level of the common femoral vein through the popliteal and proximal calf veins. COMPARISON:  March 26, 2021. FINDINGS: VENOUS Normal compressibility of the common femoral, superficial femoral, and popliteal veins, as well as the visualized calf veins. Visualized portions of  profunda femoral vein and great saphenous vein unremarkable. No filling defects to suggest DVT on grayscale or color Doppler imaging. Doppler waveforms show normal direction of venous flow, normal respiratory plasticity and response to augmentation. Limited views of the contralateral common femoral vein are unremarkable. OTHER None. Limitations: none IMPRESSION: Negative. Electronically Signed   By: Marijo Conception M.D.   On: 08/26/2021 18:18   ? ?Procedures ?Procedures  ? ? ?Medications Ordered in ED ?Medications - No data to display ? ?ED Course/ Medical Decision Making/ A&P ?Clinical Course as of 08/26/21 2015  ?Mon Aug 26, 2021  ?1939 Doppler study without signs of DVT [JK]  ?  ?Clinical Course User Index ?[JK] Dorie Rank, MD  ? ?                        ?Medical Decision Making ? ?Patient is concerned about the possibility of DVT.  She did have recent travel.  Physical exam is reassuring.  She does not have any significant tenderness there is no swelling to suggest infection.  She has no findings of acute vascular occlusion.  Doppler study is negative.  Recommend over-the-counter medications as needed for pain discomfort.  Follow-up with your doctor to be rechecked if the symptoms do not resolve in the next week ? ?Evaluation and diagnostic testing in the emergency department does not suggest an emergent condition requiring admission or immediate intervention beyond what has been performed at this time.  The patient is safe for discharge and has been instructed to return immediately for worsening symptoms, change in symptoms or any other concerns. ? ? ? ? ? ? ? ? ?Final Clinical Impression(s) / ED Diagnoses ?Final diagnoses:  ?Pain of left lower extremity  ? ? ?Rx / DC Orders ?ED Discharge Orders   ? ? None  ? ?  ? ? ?  ?Dorie Rank, MD ?08/26/21 2017 ? ?

## 2021-08-30 ENCOUNTER — Telehealth: Payer: Self-pay | Admitting: Pulmonary Disease

## 2021-08-30 DIAGNOSIS — G4733 Obstructive sleep apnea (adult) (pediatric): Secondary | ICD-10-CM

## 2021-08-30 NOTE — Telephone Encounter (Signed)
That is fine can check ONO on CPAP , make sure it is done on CPAP all night long .  ?

## 2021-08-30 NOTE — Telephone Encounter (Signed)
Yes definitely CPAP download shows good compliance with daily average usage at 8 hours.  She is on AutoSet 5 to 15 cm and AHI is significantly improved at 0.7/hour. ?Daily average pressure at 12 cm H2O. ? ?Please send order to DME to decrease CPAP AutoSet to 5 to 12 cm H2O. ?Make sure she keeps her follow-up visit in 1 month ? ?

## 2021-08-30 NOTE — Telephone Encounter (Signed)
Spoke with pt and reviewed results as dictated by Tammy. Pt stated that Tammy had mentioned that her O2 had dropped and maybe doing a test after pt got on the new C-Pap. Tammy could you please advise? ?C-Pap pressure order was sent to [facility] as well.  ?

## 2021-08-30 NOTE — Telephone Encounter (Signed)
Called and spoke with patient. I let her know that TP wants her to do the ONO with her cpap on all night. ONO order has been sent. Nothing further needed.  ?

## 2021-08-30 NOTE — Telephone Encounter (Signed)
Called and spoke with patient. She wants to know how many times she's stopped breathing and says she wants to know if there is something we can do to change cpap information. ? ?Download printed and given to TP. Please advise. ?

## 2021-09-09 DIAGNOSIS — B301 Conjunctivitis due to adenovirus: Secondary | ICD-10-CM | POA: Diagnosis not present

## 2021-09-21 DIAGNOSIS — G4733 Obstructive sleep apnea (adult) (pediatric): Secondary | ICD-10-CM | POA: Diagnosis not present

## 2021-09-25 DIAGNOSIS — R293 Abnormal posture: Secondary | ICD-10-CM | POA: Diagnosis not present

## 2021-09-25 DIAGNOSIS — M25511 Pain in right shoulder: Secondary | ICD-10-CM | POA: Diagnosis not present

## 2021-09-26 ENCOUNTER — Encounter: Payer: Self-pay | Admitting: Adult Health

## 2021-09-26 DIAGNOSIS — M25511 Pain in right shoulder: Secondary | ICD-10-CM | POA: Diagnosis not present

## 2021-09-26 DIAGNOSIS — R293 Abnormal posture: Secondary | ICD-10-CM | POA: Diagnosis not present

## 2021-09-27 DIAGNOSIS — R293 Abnormal posture: Secondary | ICD-10-CM | POA: Diagnosis not present

## 2021-09-27 DIAGNOSIS — M25511 Pain in right shoulder: Secondary | ICD-10-CM | POA: Diagnosis not present

## 2021-09-30 DIAGNOSIS — M25511 Pain in right shoulder: Secondary | ICD-10-CM | POA: Diagnosis not present

## 2021-09-30 DIAGNOSIS — R293 Abnormal posture: Secondary | ICD-10-CM | POA: Diagnosis not present

## 2021-10-01 DIAGNOSIS — M542 Cervicalgia: Secondary | ICD-10-CM | POA: Diagnosis not present

## 2021-10-01 DIAGNOSIS — M25511 Pain in right shoulder: Secondary | ICD-10-CM | POA: Diagnosis not present

## 2021-10-01 DIAGNOSIS — M25561 Pain in right knee: Secondary | ICD-10-CM | POA: Diagnosis not present

## 2021-10-01 DIAGNOSIS — M25531 Pain in right wrist: Secondary | ICD-10-CM | POA: Diagnosis not present

## 2021-10-02 DIAGNOSIS — M25511 Pain in right shoulder: Secondary | ICD-10-CM | POA: Diagnosis not present

## 2021-10-02 DIAGNOSIS — R293 Abnormal posture: Secondary | ICD-10-CM | POA: Diagnosis not present

## 2021-10-04 ENCOUNTER — Ambulatory Visit (HOSPITAL_BASED_OUTPATIENT_CLINIC_OR_DEPARTMENT_OTHER)
Admission: RE | Admit: 2021-10-04 | Discharge: 2021-10-04 | Disposition: A | Discharge status: Home / Self Care | Payer: BC Managed Care – PPO | Source: Ambulatory Visit | Attending: Medical | Admitting: Medical

## 2021-10-04 ENCOUNTER — Ambulatory Visit (INDEPENDENT_AMBULATORY_CARE_PROVIDER_SITE_OTHER): Payer: BC Managed Care – PPO | Admitting: Medical

## 2021-10-04 VITALS — BP 147/74 | HR 77 | Resp 18 | Ht 64.0 in | Wt 227.0 lb

## 2021-10-04 DIAGNOSIS — M25531 Pain in right wrist: Secondary | ICD-10-CM | POA: Diagnosis not present

## 2021-10-04 DIAGNOSIS — M79671 Pain in right foot: Secondary | ICD-10-CM

## 2021-10-04 DIAGNOSIS — M25561 Pain in right knee: Secondary | ICD-10-CM

## 2021-10-04 DIAGNOSIS — M25511 Pain in right shoulder: Secondary | ICD-10-CM | POA: Diagnosis not present

## 2021-10-04 MED ORDER — ONDANSETRON HCL 4 MG PO TABS: 4.0000 mg | ORAL_TABLET | Freq: Three times a day (TID) | ORAL | 0 refills | Status: DC | PRN

## 2021-10-04 MED ORDER — BUDESONIDE-FORMOTEROL FUMARATE 160-4.5 MCG/ACT IN AERO: 2.0000 | INHALATION_SPRAY | Freq: Two times a day (BID) | RESPIRATORY_TRACT | 12 refills | Status: AC

## 2021-10-04 NOTE — Progress Notes (Signed)
? ?  Subjective:  ? ? Patient ID: Lorraine Conrad, female    DOB: [birth year], 51 y.o.   MRN: [id number] ? ?HPI ? ?Pt in reporting she fell on concrete walking into store. Tripped on small curb.  ? ?Pt had rt hand, rt shoulder , rt foot/toe and rt knee pain.  ? ?Pt went to [facility]. She got xray of rt shoulder, rt wrist and rt knee. Pt has pain level of about 6/10.  ? ? ?Pt foot pain 4/10. Was not xrayed. ? ?She has intermittent episodes of nausea that can often occur with pain. ? ?On fall no head trauma or syncope. No dizziness.  ? ?Pt got prednisone and robaxin from [facility]. ? ? ?Review of Systems  ?Constitutional:  Negative for chills, fatigue and fever.  ?Respiratory:  Negative for cough, chest tightness, shortness of breath and wheezing.   ?Cardiovascular:  Negative for chest pain.  ?Gastrointestinal:  Negative for abdominal distention, abdominal pain and blood in stool.  ?Musculoskeletal:   ?     See hpi.  ? ?   ?Objective:  ? Physical Exam ? ? ?General ?Mental Status- Alert. General Appearance- Not in acute distress.  ? ?Skin ?General: Color- Normal Color. Moisture- Normal Moisture. ? ?Neck ?Carotid Arteries- Normal color. Moisture- Normal Moisture. No carotid bruits. No JVD. ? ?Chest and Lung Exam ?Auscultation: ?Breath Sounds:-Normal. ? ?Cardiovascular ?Auscultation:Rythm- Regular. ?Murmurs & Other Heart Sounds:Auscultation of the heart reveals- No Murmurs. ? ?Abdomen ?Inspection:-Inspeection Normal. ?Palpation/Percussion:Note:No mass. Palpation and Percussion of the abdomen reveal- Non Tender, Non Distended + BS, no rebound or guarding. ? ? ?Neurologic ?Cranial Nerve exam:- CN III-XII intact(No nystagmus), symmetric smile. ?Drift Test:- No drift. ?Romberg Exam:- Negative.  ?Strength:- 5/5 equal and symmetric strength both upper and lower extremities.  ? ?Rt shoulder- pain on range of motion and palpation. ?Rt wrist- wearing brace. ?Rt knee- pain on rom. ? ?Rt foot- distal metatarsal tendnerness. ?    ?Assessment & Plan:  ? ?Patient Instructions  ?Recent fall 2 days ago with subsequent right shoulder, right wrist, right knee pain and right foot pain.  X-rays were negative of all areas but foot x-ray was not done.  We will get foot x-ray today to assess his fracture. ? ?Nausea I think is secondary to moderate to high level pain.  Prescribing Zofran for nausea.  Note no head trauma or syncope. ? ?Continue with prednisone and Robaxin.  If pain not improving consider short course of tramadol.  Offered that today but was declined. ? ?Follow-up in 7 to 10 days or sooner if needed.  ? ?Mackie Pai, PA-C  ?

## 2021-10-04 NOTE — Patient Instructions (Addendum)
Recent fall 2 days ago with subsequent right shoulder, right wrist, right knee pain and right foot pain.  X-rays were negative of all areas but foot x-ray was not done.  We will get foot x-ray today to assess his fracture. ? ?Nausea I think is secondary to moderate to high level pain.  Prescribing Zofran for nausea.  Note no head trauma or syncope. No indication of concussion as did not hit head. ? ?Continue with prednisone and Robaxin.  If pain not improving consider short course of tramadol.  Offered that today but was declined. ? ?Follow-up in 7 to 10 days or sooner if needed. ?

## 2021-10-10 DIAGNOSIS — R293 Abnormal posture: Secondary | ICD-10-CM | POA: Diagnosis not present

## 2021-10-10 DIAGNOSIS — M25511 Pain in right shoulder: Secondary | ICD-10-CM | POA: Diagnosis not present

## 2021-10-21 DIAGNOSIS — G4733 Obstructive sleep apnea (adult) (pediatric): Secondary | ICD-10-CM | POA: Diagnosis not present

## 2021-10-22 ENCOUNTER — Telehealth: Payer: Self-pay | Admitting: Pulmonary Disease

## 2021-10-23 NOTE — Telephone Encounter (Signed)
Called patient and was unable to leave voicemail due to mailbox is full. Will call again to find out when she got her ONO done and through which DME company.  ?

## 2021-10-28 DIAGNOSIS — M25531 Pain in right wrist: Secondary | ICD-10-CM | POA: Diagnosis not present

## 2021-10-28 NOTE — Telephone Encounter (Signed)
Community message has been sent to [facility] for send over results

## 2021-10-28 NOTE — Telephone Encounter (Signed)
ONO results have been received from [facility] and I will give them to Tammy when I am back at [facility] office tomorrow.

## 2021-10-30 DIAGNOSIS — M25531 Pain in right wrist: Secondary | ICD-10-CM | POA: Diagnosis not present

## 2021-11-01 NOTE — Telephone Encounter (Signed)
[facility] Phone rang several times then line went dead.

## 2021-11-01 NOTE — Telephone Encounter (Signed)
Overnight oximetry test done on CPAP on September 26, 2021 showed no significant desaturations.  O2 saturations remained above 90% the majority of the test only 8 seconds were below 88%.  No indication for oxygen needed with her CPAP.

## 2021-11-05 DIAGNOSIS — M25531 Pain in right wrist: Secondary | ICD-10-CM | POA: Diagnosis not present

## 2021-11-05 NOTE — Telephone Encounter (Signed)
Spoke with pt and notified of results per Tammy Parrett, NP. Pt verbalized understanding and denied any questions. 

## 2021-11-12 ENCOUNTER — Encounter: Payer: Self-pay | Admitting: Adult Health

## 2021-11-12 ENCOUNTER — Ambulatory Visit (INDEPENDENT_AMBULATORY_CARE_PROVIDER_SITE_OTHER): Payer: BC Managed Care – PPO | Admitting: Adult Health

## 2021-11-12 DIAGNOSIS — G4733 Obstructive sleep apnea (adult) (pediatric): Secondary | ICD-10-CM

## 2021-11-12 NOTE — Progress Notes (Signed)
Reviewed and agree with assessment/plan.   Chesley Mires, MD [facility] 11/12/2021, 12:18 PM Pager:  [phone]

## 2021-11-12 NOTE — Progress Notes (Signed)
$'@Patient'i$  ID: Lorraine Conrad, female    DOB: [birth year], 51 y.o.   MRN: [id number]  Chief Complaint  Patient presents with   Follow-up    Referring provider: Elise Benne  HPI: 51 year old female followed for obstructive sleep apnea  TEST/EVENTS :  Pulmonary testing: Methacholine challenge 12/31/15 >> negative   Sleep tests:  PSG 02/04/18 >> AHI 27.9, SpO2 low 83% HST 08/05/21 >> AHI 52.2, SpO2 low 74%     Cardiac testing: Echo 09/27/15 >> EF 55 to 60%, grade 1 DD, mild MR CPST 01/11/16 >> deconditioning    11/12/2021 Follow up ; OSA  Patient presents for a 58-monthfollow-up.  Patient recently got a new CPAP machine after her previous machine stopped working.  Patient has underlying severe sleep apnea.  Patient says since getting her new machine she is feeling much better.  She is able to wear her CPAP every single night.  Feels more rested with decreased daytime sleepiness.  However feels that she is still tired fatigued despite getting a good night sleep.  Does have some daytime sleepiness especially in the evening hours.  Feels that she has benefited from CPAP.  CPAP download shows excellent compliance 97% usage.  Daily average usage at 7 hours.  Patient is on auto CPAP 5 to 12 cm H2O.  AHI 1.1/hour. Overnight oximetry testing done on CPAP September 26, 2021 showed no significant desaturations.  O2 saturations remained above 90% during the test with only 8 seconds below 88%.  Allergies  Allergen Reactions   Promethazine Hcl Other (See Comments)    Violent tremors    Cephalexin Diarrhea and Itching   Codeine Other (See Comments)    Was a child when she took this.   Dilaudid [Hydromorphone] Nausea And Vomiting   Soy Allergy Itching   Azithromycin Rash    Head to toe rash    Immunization History  Administered Date(s) Administered   Influenza-Unspecified 08/04/2015   PFIZER(Purple Top)SARS-COV-2 Vaccination 08/14/2019, 09/14/2019, 06/04/2020   Td 12/11/2014   Tdap  01/16/2014    Past Medical History:  Diagnosis Date   Allergy    SEASONAL   Anemia    Arthritis    "spine" (03/01/2014)   Asthma    Chronic lower back pain    Endometriosis    Erosive gastritis    External hemorrhoids    GERD (gastroesophageal reflux disease)    H/O shortness of breath 2014   Cardiopulmonary exercise test results   Headache    "probably monthly" (03/01/2014)   Migraine 1998; 02/2014   "this one's lasted 10 days straight" (03/01/2014)   OSA on CPAP    Ovarian cyst, left    Proctalgia fugax    Sleep apnea    on cpap   Thyroid disease    thyroid nodule    Tobacco History: Social History   Tobacco Use  Smoking Status Never  Smokeless Tobacco Never  Tobacco Comments   father smoked in house growing up per pt.    Counseling given: Not Answered Tobacco comments: father smoked in house growing up per pt.    Outpatient Medications Prior to Visit  Medication Sig Dispense Refill   albuterol (VENTOLIN HFA) 108 (90 Base) MCG/ACT inhaler Inhale 2 puffs into the lungs every 6 (six) hours as needed for wheezing or shortness of breath. 8 g 2   budesonide-formoterol (SYMBICORT) 160-4.5 MCG/ACT inhaler Inhale 2 puffs into the lungs 2 (two) times daily. 1 each 12   celecoxib (CELEBREX) 200  MG capsule Take 200 mg by mouth 2 (two) times daily.     fluticasone (FLONASE) 50 MCG/ACT nasal spray SPRAY 2 SPRAYS INTO EACH NOSTRIL EVERY DAY 16 mL 5   lansoprazole (PREVACID) 30 MG capsule Take 30 mg by mouth as needed.     azelastine (ASTELIN) 0.1 % nasal spray Place 1 spray into both nostrils 2 (two) times daily. Use in each nostril as directed (Patient not taking: Reported on 11/12/2021) 30 mL 3   loratadine (CLARITIN) 10 MG tablet Take 1 tablet (10 mg total) by mouth daily. 30 tablet 0   montelukast (SINGULAIR) 10 MG tablet Take 1 tablet (10 mg total) by mouth at bedtime. (Patient not taking: Reported on 11/12/2021) 30 tablet 3   ondansetron (ZOFRAN) 4 MG tablet Take 1 tablet (4  mg total) by mouth every 8 (eight) hours as needed for nausea or vomiting. (Patient not taking: Reported on 11/12/2021) 20 tablet 0   No facility-administered medications prior to visit.     Review of Systems:   Constitutional:   No  weight loss, night sweats,  Fevers, chills, fatigue, or  lassitude.  HEENT:   No headaches,  Difficulty swallowing,  Tooth/dental problems, or  Sore throat,                No sneezing, itching, ear ache, nasal congestion, post nasal drip,   CV:  No chest pain,  Orthopnea, PND, swelling in lower extremities, anasarca, dizziness, palpitations, syncope.   GI  No heartburn, indigestion, abdominal pain, nausea, vomiting, diarrhea, change in bowel habits, loss of appetite, bloody stools.   Resp: No shortness of breath with exertion or at rest.  No excess mucus, no productive cough,  No non-productive cough,  No coughing up of blood.  No change in color of mucus.  No wheezing.  No chest wall deformity  Skin: no rash or lesions.  GU: no dysuria, change in color of urine, no urgency or frequency.  No flank pain, no hematuria   MS:  No joint pain or swelling.  No decreased range of motion.  No back pain.    Physical Exam  BP 110/70 (BP Location: Left Arm, Patient Position: Sitting, Cuff Size: Large)   Pulse 92   Temp 97.9 F (36.6 C) (Oral)   Ht '5\' 4"'$  (1.626 m)   Wt 228 lb 9.6 oz (103.7 kg)   LMP 02/08/2013   SpO2 99%   BMI 39.24 kg/m   GEN: A/Ox3; pleasant , NAD, well nourished    HEENT:  [place]/AT,   NOSE-clear, THROAT-clear, no lesions, no postnasal drip or exudate noted.  Class 3 MP airway   NECK:  Supple w/ fair ROM; no JVD; normal carotid impulses w/o bruits; no thyromegaly or nodules palpated; no lymphadenopathy.    RESP  Clear  P & A; w/o, wheezes/ rales/ or rhonchi. no accessory muscle use, no dullness to percussion  CARD:  RRR, no m/r/g, no peripheral edema, pulses intact, no cyanosis or clubbing.  GI:   Soft & nt; nml bowel sounds; no  organomegaly or masses detected.   Musco: Warm bil, no deformities or joint swelling noted.   Neuro: alert, no focal deficits noted.    Skin: Warm, no lesions or rashes    Lab Results:  CBC   BNP   ProBNP No results found for: PROBNP  Imaging: No results found.       Latest Ref Rng & Units 12/31/2015    1:01 PM  PFT Results  FVC-Pre L 2.98    FVC-Predicted Pre % 81    FVC-Post L 2.84    FVC-Predicted Post % 77    Pre FEV1/FVC % % 88    Post FEV1/FCV % % 89    FEV1-Pre L 2.62    FEV1-Predicted Pre % 88    FEV1-Post L 2.52      Lab Results  Component Value Date   NITRICOXIDE 16 12/25/2015        Assessment & Plan:   OSA (obstructive sleep apnea) Severe obstructive sleep apnea with excellent control compliance on nocturnal CPAP.  Overnight oximetry testing on CPAP showed no nocturnal desaturations. Patient does have some ongoing daytime sleepiness but has improved since starting her new machine.  We will continue to have ongoing monitor.  We discussed increasing her activity during the daytime.  Also follow-up with primary care for other causes of daytime fatigue. We will reevaluate all follow-up visit in 6 months.  If continues to have hypersomnia with well-controlled sleep apnea can consider medications if indicated  Plan  Patient Instructions  Wear CPAP At bedtime  -all night  Work on healthy sleep regimen  Do not drive if sleepy   Work on healthy weight loss.  Activity as tolerated.  Follow up with Dr. Halford Chessman or Shekera Beavers NP in 6 months and As needed          Rexene Edison, NP 11/12/2021

## 2021-11-12 NOTE — Assessment & Plan Note (Signed)
Severe obstructive sleep apnea with excellent control compliance on nocturnal CPAP.  Overnight oximetry testing on CPAP showed no nocturnal desaturations. Patient does have some ongoing daytime sleepiness but has improved since starting her new machine.  We will continue to have ongoing monitor.  We discussed increasing her activity during the daytime.  Also follow-up with primary care for other causes of daytime fatigue. We will reevaluate all follow-up visit in 6 months.  If continues to have hypersomnia with well-controlled sleep apnea can consider medications if indicated  Plan  Patient Instructions  Wear CPAP At bedtime  -all night  Work on healthy sleep regimen  Do not drive if sleepy   Work on healthy weight loss.  Activity as tolerated.  Follow up with Dr. Halford Chessman or Yarenis Cerino NP in 6 months and As needed

## 2021-11-12 NOTE — Patient Instructions (Addendum)
Wear CPAP At bedtime  -all night  Work on healthy sleep regimen  Do not drive if sleepy   Work on healthy weight loss.  Activity as tolerated.  Follow up with Dr. Halford Chessman or Alphus Zeck NP in 6 months and As needed

## 2021-11-14 DIAGNOSIS — M1811 Unilateral primary osteoarthritis of first carpometacarpal joint, right hand: Secondary | ICD-10-CM | POA: Diagnosis not present

## 2021-11-14 DIAGNOSIS — M25531 Pain in right wrist: Secondary | ICD-10-CM | POA: Diagnosis not present

## 2021-11-20 ENCOUNTER — Telehealth: Payer: Self-pay | Admitting: Adult Health

## 2021-11-21 DIAGNOSIS — G4733 Obstructive sleep apnea (adult) (pediatric): Secondary | ICD-10-CM | POA: Diagnosis not present

## 2021-11-22 NOTE — Telephone Encounter (Signed)
Called patient and she states that a few days ago she was having some chest tightness even after using her Symbicort. She states that she feels like it was due to the air quality but was not sure. She states yesterday she felt better. And that the issues with her cpap she has not felt again since the other night when she felt burning sensation.   Just sending as an Pharmacist, hospital.   Or if you had any suggestions for patient  Please advise sir.

## 2021-11-22 NOTE — Telephone Encounter (Signed)
These issues could have been related to air quality from pollen and [employer] smoke.  If she is doing better, then nothing additional needed at this time.

## 2021-11-29 DIAGNOSIS — Z90710 Acquired absence of both cervix and uterus: Secondary | ICD-10-CM | POA: Diagnosis not present

## 2021-11-29 DIAGNOSIS — N898 Other specified noninflammatory disorders of vagina: Secondary | ICD-10-CM | POA: Diagnosis not present

## 2021-11-29 DIAGNOSIS — R350 Frequency of micturition: Secondary | ICD-10-CM | POA: Diagnosis not present

## 2021-11-29 DIAGNOSIS — R102 Pelvic and perineal pain: Secondary | ICD-10-CM | POA: Diagnosis not present

## 2021-12-12 DIAGNOSIS — M79644 Pain in right finger(s): Secondary | ICD-10-CM | POA: Diagnosis not present

## 2021-12-12 DIAGNOSIS — M25531 Pain in right wrist: Secondary | ICD-10-CM | POA: Diagnosis not present

## 2021-12-13 DIAGNOSIS — B078 Other viral warts: Secondary | ICD-10-CM | POA: Diagnosis not present

## 2021-12-13 DIAGNOSIS — B351 Tinea unguium: Secondary | ICD-10-CM | POA: Diagnosis not present

## 2021-12-13 DIAGNOSIS — L308 Other specified dermatitis: Secondary | ICD-10-CM | POA: Diagnosis not present

## 2021-12-13 DIAGNOSIS — S61254A Open bite of right ring finger without damage to nail, initial encounter: Secondary | ICD-10-CM | POA: Diagnosis not present

## 2021-12-21 DIAGNOSIS — G4733 Obstructive sleep apnea (adult) (pediatric): Secondary | ICD-10-CM | POA: Diagnosis not present

## 2021-12-24 DIAGNOSIS — N9089 Other specified noninflammatory disorders of vulva and perineum: Secondary | ICD-10-CM | POA: Diagnosis not present

## 2021-12-25 ENCOUNTER — Ambulatory Visit (INDEPENDENT_AMBULATORY_CARE_PROVIDER_SITE_OTHER): Payer: BC Managed Care – PPO | Admitting: Medical

## 2021-12-25 VITALS — BP 128/81 | HR 72 | Temp 98.0°F | Resp 16 | Ht 64.0 in | Wt 219.4 lb

## 2021-12-25 DIAGNOSIS — L089 Local infection of the skin and subcutaneous tissue, unspecified: Secondary | ICD-10-CM | POA: Diagnosis not present

## 2021-12-25 MED ORDER — MUPIROCIN 2 % EX OINT: 1.0000 | TOPICAL_OINTMENT | Freq: Two times a day (BID) | CUTANEOUS | 0 refills | Status: DC

## 2021-12-25 MED ORDER — DOXYCYCLINE HYCLATE 100 MG PO TABS: 100.0000 mg | ORAL_TABLET | Freq: Two times a day (BID) | ORAL | 0 refills | Status: DC

## 2021-12-25 NOTE — Progress Notes (Signed)
Subjective:    Patient ID: Lorraine Conrad, female    DOB: Aug 14, 1970, 51 y.o.   MRN: [id number]  HPI Pt had red area at base of left 5th toe on Sunday. Raised white area in center. Since Sunday white area resolved but red area still present and mid tender. She was concerned for infection.     Review of Systems  Constitutional:  Negative for chills, fatigue and fever.  Respiratory:  Negative for cough, chest tightness, shortness of breath and wheezing.   Cardiovascular:  Negative for chest pain and palpitations.  Gastrointestinal:  Negative for abdominal pain.  Skin:        See hpi.  Hematological:  Negative for adenopathy. Does not bruise/bleed easily.    Past Medical History:  Diagnosis Date   Allergy    SEASONAL   Anemia    Arthritis    "spine" (03/01/2014)   Asthma    Chronic lower back pain    Endometriosis    Erosive gastritis    External hemorrhoids    GERD (gastroesophageal reflux disease)    H/O shortness of breath 2014   Cardiopulmonary exercise test results   Headache    "probably monthly" (03/01/2014)   Migraine 1998; 02/2014   "this one's lasted 10 days straight" (03/01/2014)   OSA on CPAP    Ovarian cyst, left    Proctalgia fugax    Sleep apnea    on cpap   Thyroid disease    thyroid nodule     Social History   Socioeconomic History   Marital status: Married    Spouse name: Not on file   Number of children: 2   Years of education: Not on file   Highest education level: Not on file  Occupational History   Occupation: homemaker  Tobacco Use   Smoking status: Never   Smokeless tobacco: Never   Tobacco comments:    father smoked in house growing up per pt.   Vaping Use   Vaping Use: Never used  Substance and Sexual Activity   Alcohol use: Yes    Alcohol/week: 0.0 standard drinks of alcohol    Comment: 12-23-16 "I'll have 1-2 drinks maybe 3-4 times/yr"   Drug use: No   Sexual activity: Yes    Birth control/protection: Surgical  Other Topics  Concern   Not on file  Social History Narrative   Daily caffiene use: 6 cups per day   Patient does not get regular excercise   Social Determinants of Health   Financial Resource Strain: Not on file  Food Insecurity: Not on file  Transportation Needs: Not on file  Physical Activity: Not on file  Stress: Not on file  Social Connections: Not on file  Intimate Partner Violence: Not on file    Past Surgical History:  Procedure Laterality Date   ABDOMINAL HYSTERECTOMY  04/2013   BREAST BIOPSY Bilateral 10/05/2000   BREAST BIOPSY Right 08/26/2019   Fibroadenoma   BREAST EXCISIONAL BIOPSY Left 1991   CESAREAN SECTION  2002; 2006   COLONOSCOPY     LAPAROSCOPIC OVARIAN CYSTECTOMY  ~ [place]  ~ 1991   OOPHORECTOMY Right 2015   TEMPOROMANDIBULAR JOINT SURGERY  ~ 1992   UPPER GASTROINTESTINAL ENDOSCOPY      Family History  Problem Relation Age of Onset   Prostate cancer Paternal Grandfather    Colon polyps Paternal Grandfather    Colon polyps Father    Heart disease Father  Diverticulitis Mother    Colon polyps Paternal Grandmother    Breast cancer Paternal Grandmother    Colon cancer Neg Hx    Esophageal cancer Neg Hx    Rectal cancer Neg Hx    Stomach cancer Neg Hx    Pancreatic cancer Neg Hx     Allergies  Allergen Reactions   Promethazine Hcl Other (See Comments)    Violent tremors    Cephalexin Diarrhea and Itching   Codeine Other (See Comments)    Was a child when she took this.   Dilaudid [Hydromorphone] Nausea And Vomiting   Soy Allergy Itching   Azithromycin Rash    Head to toe rash    Current Outpatient Medications on File Prior to Visit  Medication Sig Dispense Refill   albuterol (VENTOLIN HFA) 108 (90 Base) MCG/ACT inhaler Inhale 2 puffs into the lungs every 6 (six) hours as needed for wheezing or shortness of breath. 8 g 2   azelastine (ASTELIN) 0.1 % nasal spray Place 1 spray into both nostrils 2 (two) times daily. Use in each  nostril as directed 30 mL 3   budesonide-formoterol (SYMBICORT) 160-4.5 MCG/ACT inhaler Inhale 2 puffs into the lungs 2 (two) times daily. 1 each 12   celecoxib (CELEBREX) 200 MG capsule Take 200 mg by mouth 2 (two) times daily.     fluticasone (FLONASE) 50 MCG/ACT nasal spray SPRAY 2 SPRAYS INTO EACH NOSTRIL EVERY DAY 16 mL 5   lansoprazole (PREVACID) 30 MG capsule Take 30 mg by mouth as needed.     loratadine (CLARITIN) 10 MG tablet Take 1 tablet (10 mg total) by mouth daily. 30 tablet 0   montelukast (SINGULAIR) 10 MG tablet Take 1 tablet (10 mg total) by mouth at bedtime. 30 tablet 3   No current facility-administered medications on file prior to visit.    BP 128/81 (BP Location: Left Arm, Patient Position: Sitting, Cuff Size: Normal)   Pulse 72   Temp 98 F (36.7 C) (Oral)   Resp 16   Ht '5\' 4"'$  (1.626 m)   Wt 219 lb 6.4 oz (99.5 kg)   LMP 02/08/2013   SpO2 100%   BMI 37.66 kg/m        Objective:   Physical Exam   General- No acute distress. Pleasant patient. Neck- Full range of motion, no jvd Lungs- Clear, even and unlabored. Heart- regular rate and rhythm. Neurologic- CNII- XII grossly intact.  Left foot- base of 5th toe. Small 6 mm red area. Mild tender to palpation.     Assessment & Plan:   Patient Instructions  Very early mild skin infection. Can use mupirocin topical antibiotic twice daily for one week. I thinks this will be adequate but if worsens or changes as dicussed then start doxycycline oral antibiotic.  Rx advisement given on doxycycline.  Follow up late sept-early October wellness exam but sooner if needed.    Mackie Pai, PA-C

## 2021-12-25 NOTE — Patient Instructions (Addendum)
Very early mild skin infection. Can use mupirocin topical antibiotic twice daily for one week. I thinks this will be adequate but if worsens or changes as dicussed then start doxycycline oral antibiotic.  Rx advisement given on doxycycline.  Follow up late sept-early October wellness exam but sooner if needed.

## 2021-12-27 DIAGNOSIS — G4733 Obstructive sleep apnea (adult) (pediatric): Secondary | ICD-10-CM | POA: Diagnosis not present

## 2021-12-30 ENCOUNTER — Other Ambulatory Visit: Payer: Self-pay

## 2021-12-30 ENCOUNTER — Emergency Department (HOSPITAL_BASED_OUTPATIENT_CLINIC_OR_DEPARTMENT_OTHER): Payer: BC Managed Care – PPO

## 2021-12-30 ENCOUNTER — Emergency Department (HOSPITAL_BASED_OUTPATIENT_CLINIC_OR_DEPARTMENT_OTHER)
Admission: EM | Admit: 2021-12-30 | Discharge: 2021-12-30 | Disposition: A | Discharge status: Home / Self Care | Payer: BC Managed Care – PPO | Attending: Emergency Medicine | Admitting: Emergency Medicine

## 2021-12-30 ENCOUNTER — Encounter (HOSPITAL_BASED_OUTPATIENT_CLINIC_OR_DEPARTMENT_OTHER): Payer: Self-pay | Admitting: Emergency Medicine

## 2021-12-30 DIAGNOSIS — M79604 Pain in right leg: Secondary | ICD-10-CM | POA: Insufficient documentation

## 2021-12-30 DIAGNOSIS — M79661 Pain in right lower leg: Secondary | ICD-10-CM

## 2021-12-30 DIAGNOSIS — C44519 Basal cell carcinoma of skin of other part of trunk: Secondary | ICD-10-CM | POA: Diagnosis not present

## 2021-12-30 DIAGNOSIS — L300 Nummular dermatitis: Secondary | ICD-10-CM | POA: Diagnosis not present

## 2021-12-30 DIAGNOSIS — M7989 Other specified soft tissue disorders: Secondary | ICD-10-CM | POA: Insufficient documentation

## 2021-12-30 DIAGNOSIS — S60569A Insect bite (nonvenomous) of unspecified hand, initial encounter: Secondary | ICD-10-CM | POA: Diagnosis not present

## 2021-12-30 NOTE — Discharge Instructions (Signed)
Wear compression hose to help with swelling and calf pain.  Can take ibuprofen for the next 7 days scheduled.  Recommend recheck with your primary care provider if symptoms persist.

## 2021-12-30 NOTE — ED Notes (Signed)
Right calf pain, started 2 weeks prior back side of calf. No obvious injury/redness/swelling (compared tot he left). PMS intact. Pain increases upon flexion. Rates 5/10.

## 2021-12-30 NOTE — ED Provider Notes (Signed)
[facility] EMERGENCY DEPT Provider Note   CSN: [id number] Arrival date & time: 12/30/21  1350     History  Chief Complaint  Patient presents with   Leg Pain    Lorraine Conrad is a 51 y.o. female.  51 year old female with past medical history of GERD, OSA, arthritis presents with complaint of right calf pain for the past few days.  Patient states that she did recently go to a local beach, otherwise no extended recent travel.  Denies hormone therapy use, history of PE or DVT, is a non-smoker, no history of cancer.  Pain is worse as she is trying to drive with this leg.  Denies injury to the leg.  No other complaints or concerns.       Home Medications Prior to Admission medications   Medication Sig Start Date End Date Taking? Authorizing Provider  albuterol (VENTOLIN HFA) 108 (90 Base) MCG/ACT inhaler Inhale 2 puffs into the lungs every 6 (six) hours as needed for wheezing or shortness of breath. 07/10/21   Inda Coke, PA  azelastine (ASTELIN) 0.1 % nasal spray Place 1 spray into both nostrils 2 (two) times daily. Use in each nostril as directed 08/23/21   Inda Coke, PA  budesonide-formoterol [facility]) 160-4.5 MCG/ACT inhaler Inhale 2 puffs into the lungs 2 (two) times daily. 10/04/21   Saguier, Percell Miller, PA-C  celecoxib (CELEBREX) 200 MG capsule Take 200 mg by mouth 2 (two) times daily. 11/05/21   [provider]  doxycycline (VIBRA-TABS) 100 MG tablet Take 1 tablet (100 mg total) by mouth 2 (two) times daily. 12/25/21   Saguier, Percell Miller, PA-C  fluticasone [facility]) 50 MCG/ACT nasal spray SPRAY 2 SPRAYS INTO EACH NOSTRIL EVERY DAY 03/14/21   Saguier, Percell Miller, PA-C  lansoprazole (PREVACID) 30 MG capsule Take 30 mg by mouth as needed.    [provider]  loratadine (CLARITIN) 10 MG tablet Take 1 tablet (10 mg total) by mouth daily. 04/24/16   Saguier, Percell Miller, PA-C  montelukast (SINGULAIR) 10 MG tablet Take 1 tablet (10 mg total) by mouth at  bedtime. 08/23/21   Inda Coke, PA  mupirocin ointment (BACTROBAN) 2 % Apply 1 Application topically 2 (two) times daily. 12/25/21   Saguier, Percell Miller, PA-C      Allergies    Promethazine hcl, Cephalexin, Codeine, Dilaudid [hydromorphone], Soy allergy, and Azithromycin    Review of Systems   Review of Systems Negative except as per HPI Physical Exam Updated Vital Signs BP 113/85   Pulse 71   Temp 97.8 F (36.6 C)   Resp 16   LMP 02/08/2013   SpO2 99%  Physical Exam Vitals and nursing note reviewed.  Constitutional:      General: She is not in acute distress.    Appearance: She is well-developed. She is not diaphoretic.  HENT:     Head: Normocephalic and atraumatic.  Cardiovascular:     Pulses: Normal pulses.  Pulmonary:     Effort: Pulmonary effort is normal.  Musculoskeletal:        General: Swelling and tenderness present. No deformity or signs of injury.     Right lower leg: No edema.     Left lower leg: No edema.     Comments: Mild right calf tenderness.  Right lower leg is slightly swollen compared to left.  No palpable cords, no erythema.  DP pulse present, sensation intact.  Skin:    General: Skin is warm and dry.  Neurological:     Mental Status: She is  alert and oriented to person, place, and time.     Sensory: No sensory deficit.     Motor: No weakness.     Gait: Gait normal.  Psychiatric:        Behavior: Behavior normal.     ED Results / Procedures / Treatments   Labs (all labs ordered are listed, but only abnormal results are displayed) Labs Reviewed - No data to display  EKG None  Radiology US Venous Img Lower Unilateral Right  Result Date: 12/30/2021 CLINICAL DATA:  Right calf pain and swelling for the past 2 weeks. No known injury. EXAM: RIGHT LOWER EXTREMITY VENOUS DOPPLER ULTRASOUND TECHNIQUE: Gray-scale sonography with graded compression, as well as color Doppler and duplex ultrasound were performed to evaluate the lower extremity deep  venous systems from the level of the common femoral vein and including the common femoral, femoral, profunda femoral, popliteal and calf veins including the posterior tibial, peroneal and gastrocnemius veins when visible. The superficial great saphenous vein was also interrogated. Spectral Doppler was utilized to evaluate flow at rest and with distal augmentation maneuvers in the common femoral, femoral and popliteal veins. COMPARISON:  Right lower extremity venous Doppler ultrasound-04/13/2020 (negative). FINDINGS: Contralateral Common Femoral Vein: Respiratory phasicity is normal and symmetric with the symptomatic side. No evidence of thrombus. Normal compressibility. Common Femoral Vein: No evidence of thrombus. Normal compressibility, respiratory phasicity and response to augmentation. Saphenofemoral Junction: No evidence of thrombus. Normal compressibility and flow on color Doppler imaging. Profunda Femoral Vein: No evidence of thrombus. Normal compressibility and flow on color Doppler imaging. Femoral Vein: No evidence of thrombus. Normal compressibility, respiratory phasicity and response to augmentation. Popliteal Vein: No evidence of thrombus. Normal compressibility, respiratory phasicity and response to augmentation. Calf Veins: No evidence of thrombus. Normal compressibility and flow on color Doppler imaging. Superficial Great Saphenous Vein: No evidence of thrombus. Normal compressibility. Other Findings: Sonographic evaluation of patient's area of discomfort involving the posterior aspect calf is negative for sonographic correlate (images 52 through 58). Specifically, no regional superficial thrombophlebitis. IMPRESSION: 1. No evidence of DVT within right lower extremity. 2. Sonographic evaluation of patient's area of discomfort involving the posterior aspect of the calf is negative for sonographic correlate. Specifically, no regional superficial thrombophlebitis. Electronically Signed   By: Sandi Mariscal  M.D.   On: 12/30/2021 16:06    Procedures Procedures    Medications Ordered in ED Medications - No data to display  ED Course/ Medical Decision Making/ A&P                           Medical Decision Making  51 year old female presents with concern for right calf pain as above.  She is found to have mild right calf tenderness without palpable cords or erythema.  Doubt cellulitis.  Consider possible DVT.  Venous Doppler of the right lower extremity was ordered, as read by radiology is negative for acute DVT.  Recommend compression hose, can trial course of NSAID and follow-up with PCP for recheck.        Final Clinical Impression(s) / ED Diagnoses Final diagnoses:  Right calf pain    Rx / DC Orders ED Discharge Orders     None         Tacy Learn, PA-C 12/30/21 1619    Malvin Johns, MD 12/30/21 2310

## 2021-12-30 NOTE — ED Triage Notes (Signed)
Pt arrives to ED with c/o right calf pain that started x2 weeks ago.

## 2021-12-30 NOTE — ED Notes (Signed)
Discharge paperwork given and understood. 

## 2022-01-06 DIAGNOSIS — C44519 Basal cell carcinoma of skin of other part of trunk: Secondary | ICD-10-CM | POA: Diagnosis not present

## 2022-01-20 DIAGNOSIS — Z85828 Personal history of other malignant neoplasm of skin: Secondary | ICD-10-CM | POA: Diagnosis not present

## 2022-01-20 DIAGNOSIS — Z08 Encounter for follow-up examination after completed treatment for malignant neoplasm: Secondary | ICD-10-CM | POA: Diagnosis not present

## 2022-01-20 DIAGNOSIS — L308 Other specified dermatitis: Secondary | ICD-10-CM | POA: Diagnosis not present

## 2022-01-21 DIAGNOSIS — G4733 Obstructive sleep apnea (adult) (pediatric): Secondary | ICD-10-CM | POA: Diagnosis not present

## 2022-01-27 DIAGNOSIS — G4733 Obstructive sleep apnea (adult) (pediatric): Secondary | ICD-10-CM | POA: Diagnosis not present

## 2022-02-11 ENCOUNTER — Telehealth: Payer: Self-pay | Admitting: Adult Health

## 2022-02-11 ENCOUNTER — Encounter: Payer: Self-pay | Admitting: *Deleted

## 2022-02-11 NOTE — Telephone Encounter (Signed)
Letter done and up front for pick up  Called the pt and there was no answer- left detailed msg on machine ok per DPR letting her know it's ready

## 2022-02-12 DIAGNOSIS — Z01411 Encounter for gynecological examination (general) (routine) with abnormal findings: Secondary | ICD-10-CM | POA: Diagnosis not present

## 2022-02-12 DIAGNOSIS — Z90710 Acquired absence of both cervix and uterus: Secondary | ICD-10-CM | POA: Diagnosis not present

## 2022-02-12 DIAGNOSIS — R4586 Emotional lability: Secondary | ICD-10-CM | POA: Diagnosis not present

## 2022-02-21 DIAGNOSIS — G4733 Obstructive sleep apnea (adult) (pediatric): Secondary | ICD-10-CM | POA: Diagnosis not present

## 2022-02-25 ENCOUNTER — Encounter: Payer: BC Managed Care – PPO | Admitting: Medical

## 2022-02-27 DIAGNOSIS — G4733 Obstructive sleep apnea (adult) (pediatric): Secondary | ICD-10-CM | POA: Diagnosis not present

## 2022-03-10 ENCOUNTER — Ambulatory Visit (INDEPENDENT_AMBULATORY_CARE_PROVIDER_SITE_OTHER): Payer: BC Managed Care – PPO | Admitting: Medical

## 2022-03-10 ENCOUNTER — Other Ambulatory Visit: Payer: Self-pay | Admitting: Medical

## 2022-03-10 ENCOUNTER — Encounter: Payer: Self-pay | Admitting: Medical

## 2022-03-10 VITALS — BP 120/70 | HR 78 | Temp 98.2°F | Resp 18 | Ht 64.0 in | Wt 224.6 lb

## 2022-03-10 DIAGNOSIS — R5383 Other fatigue: Secondary | ICD-10-CM | POA: Diagnosis not present

## 2022-03-10 DIAGNOSIS — Z0000 Encounter for general adult medical examination without abnormal findings: Secondary | ICD-10-CM | POA: Diagnosis not present

## 2022-03-10 DIAGNOSIS — E559 Vitamin D deficiency, unspecified: Secondary | ICD-10-CM | POA: Diagnosis not present

## 2022-03-10 LAB — VITAMIN D 25 HYDROXY (VIT D DEFICIENCY, FRACTURES): VITD: 25.47 ng/mL — ABNORMAL LOW (ref 30.00–100.00)

## 2022-03-10 LAB — VITAMIN B12: Vitamin B-12: 222 pg/mL (ref 211–911)

## 2022-03-10 LAB — T4, FREE: Free T4: 0.83 ng/dL (ref 0.60–1.60)

## 2022-03-10 LAB — TSH: TSH: 2.05 u[IU]/mL (ref 0.35–5.50)

## 2022-03-10 MED ORDER — VITAMIN D (ERGOCALCIFEROL) 1.25 MG (50000 UNIT) PO CAPS: 50000.0000 [IU] | ORAL_CAPSULE | ORAL | 0 refills | Status: DC

## 2022-03-10 MED ORDER — AZELASTINE HCL 0.1 % NA SOLN: 1.0000 | Freq: Two times a day (BID) | NASAL | 3 refills | Status: DC

## 2022-03-10 NOTE — Patient Instructions (Addendum)
For you wellness exam today I have ordered cbc, cmp and  lipid panel.  For fatigue adding tsh, t4, b12 and b1 level.  For vit d deficiency in past will get vit d level.  Declined flu vaccine.   Recommend exercise and healthy diet.  We will let you know lab results as they come in.  Follow up date appointment will be determined after lab review.     Preventive Care 49-49 Years Old, Female Preventive care refers to lifestyle choices and visits with your health care provider that can promote health and wellness. Preventive care visits are also called wellness exams. What can I expect for my preventive care visit? Counseling Your health care provider may ask you questions about your: Medical history, including: Past medical problems. Family medical history. Pregnancy history. Current health, including: Menstrual cycle. Method of birth control. Emotional well-being. Home life and relationship well-being. Sexual activity and sexual health. Lifestyle, including: Alcohol, nicotine or tobacco, and drug use. Access to firearms. Diet, exercise, and sleep habits. Work and work Statistician. Sunscreen use. Safety issues such as seatbelt and bike helmet use. Physical exam Your health care provider will check your: Height and weight. These may be used to calculate your BMI (body mass index). BMI is a measurement that tells if you are at a healthy weight. Waist circumference. This measures the distance around your waistline. This measurement also tells if you are at a healthy weight and may help predict your risk of certain diseases, such as type 2 diabetes and high blood pressure. Heart rate and blood pressure. Body temperature. Skin for abnormal spots. What immunizations do I need?  Vaccines are usually given at various ages, according to a schedule. Your health care provider will recommend vaccines for you based on your age, medical history, and lifestyle or other factors, such as  travel or where you work. What tests do I need? Screening Your health care provider may recommend screening tests for certain conditions. This may include: Lipid and cholesterol levels. Diabetes screening. This is done by checking your blood sugar (glucose) after you have not eaten for a while (fasting). Pelvic exam and Pap test. Hepatitis B test. Hepatitis C test. HIV (human immunodeficiency virus) test. STI (sexually transmitted infection) testing, if you are at risk. Lung cancer screening. Colorectal cancer screening. Mammogram. Talk with your health care provider about when you should start having regular mammograms. This may depend on whether you have a family history of breast cancer. BRCA-related cancer screening. This may be done if you have a family history of breast, ovarian, tubal, or peritoneal cancers. Bone density scan. This is done to screen for osteoporosis. Talk with your health care provider about your test results, treatment options, and if necessary, the need for more tests. Follow these instructions at home: Eating and drinking  Eat a diet that includes fresh fruits and vegetables, whole grains, lean protein, and low-fat dairy products. Take vitamin and mineral supplements as recommended by your health care provider. Do not drink alcohol if: Your health care provider tells you not to drink. You are pregnant, may be pregnant, or are planning to become pregnant. If you drink alcohol: Limit how much you have to 0-1 drink a day. Know how much alcohol is in your drink. In the [place] one drink equals one 12 oz bottle of beer (355 mL), one 5 oz glass of wine (148 mL), or one 1 oz glass of hard liquor (44 mL). Lifestyle Brush your teeth every morning and  night with fluoride toothpaste. Floss one time each day. Exercise for at least 30 minutes 5 or more days each week. Do not use any products that contain nicotine or tobacco. These products include cigarettes, chewing  tobacco, and vaping devices, such as e-cigarettes. If you need help quitting, ask your health care provider. Do not use drugs. If you are sexually active, practice safe sex. Use a condom or other form of protection to prevent STIs. If you do not wish to become pregnant, use a form of birth control. If you plan to become pregnant, see your health care provider for a prepregnancy visit. Take aspirin only as told by your health care provider. Make sure that you understand how much to take and what form to take. Work with your health care provider to find out whether it is safe and beneficial for you to take aspirin daily. Find healthy ways to manage stress, such as: Meditation, yoga, or listening to music. Journaling. Talking to a trusted person. Spending time with friends and family. Minimize exposure to UV radiation to reduce your risk of skin cancer. Safety Always wear your seat belt while driving or riding in a vehicle. Do not drive: If you have been drinking alcohol. Do not ride with someone who has been drinking. When you are tired or distracted. While texting. If you have been using any mind-altering substances or drugs. Wear a helmet and other protective equipment during sports activities. If you have firearms in your house, make sure you follow all gun safety procedures. Seek help if you have been physically or sexually abused. What's next? Visit your health care provider once a year for an annual wellness visit. Ask your health care provider how often you should have your eyes and teeth checked. Stay up to date on all vaccines. This information is not intended to replace advice given to you by your health care provider. Make sure you discuss any questions you have with your health care provider. Document Revised: 11/21/2020 Document Reviewed: 11/21/2020 Elsevier Patient Education  [place].

## 2022-03-10 NOTE — Progress Notes (Signed)
Subjective:    Patient ID: Lorraine Conrad, female    DOB: August 22, 1970, 51 y.o.   MRN: [id number]  HPI Pt in for wellness exam. She is fasting.  Pt is fasting. Pt has not been exercising. Pt states not eating health recently. She lost 20 pounds over past month but then gained wt back.. Pt states recently drinking a lot of caffeine beverages . No alcohol and none smoker.   02-14-2021  mammogram done.   03-2017 colonoscopy done. On report states repeat in 10 years.  Paps smear within last month.   Pt had covid vaccine and booster in past but not this yer.. She is declining flu vaccine.   Review of Systems  Constitutional:  Positive for fatigue. Negative for chills and fever.  HENT:  Negative for congestion and drooling.   Respiratory:  Negative for cough, chest tightness, shortness of breath and wheezing.   Cardiovascular:  Negative for chest pain and palpitations.  Gastrointestinal:  Negative for abdominal distention, blood in stool, diarrhea and nausea.  Genitourinary:  Negative for dysuria, flank pain and frequency.  Musculoskeletal:  Negative for back pain, myalgias and neck stiffness.  Skin:  Negative for rash.  Neurological:  Negative for seizures, syncope, facial asymmetry, weakness and light-headedness.  Hematological:  Negative for adenopathy. Does not bruise/bleed easily.  Psychiatric/Behavioral:  Negative for behavioral problems, confusion and decreased concentration.     Past Medical History:  Diagnosis Date   Allergy    SEASONAL   Anemia    Arthritis    "spine" (03/01/2014)   Asthma    Chronic lower back pain    Endometriosis    Erosive gastritis    External hemorrhoids    GERD (gastroesophageal reflux disease)    H/O shortness of breath 2014   Cardiopulmonary exercise test results   Headache    "probably monthly" (03/01/2014)   Migraine 1998; 02/2014   "this one's lasted 10 days straight" (03/01/2014)   OSA on CPAP    Ovarian cyst, left    Proctalgia fugax     Sleep apnea    on cpap   Thyroid disease    thyroid nodule     Social History   Socioeconomic History   Marital status: Married    Spouse name: Not on file   Number of children: 2   Years of education: Not on file   Highest education level: Not on file  Occupational History   Occupation: homemaker  Tobacco Use   Smoking status: Never   Smokeless tobacco: Never   Tobacco comments:    father smoked in house growing up per pt.   Vaping Use   Vaping Use: Never used  Substance and Sexual Activity   Alcohol use: Yes    Alcohol/week: 0.0 standard drinks of alcohol    Comment: 12-23-16 "I'll have 1-2 drinks maybe 3-4 times/yr"   Drug use: No   Sexual activity: Yes    Birth control/protection: Surgical  Other Topics Concern   Not on file  Social History Narrative   Daily caffiene use: 6 cups per day   Patient does not get regular excercise   Social Determinants of Health   Financial Resource Strain: Not on file  Food Insecurity: Not on file  Transportation Needs: Not on file  Physical Activity: Not on file  Stress: Not on file  Social Connections: Not on file  Intimate Partner Violence: Not on file    Past Surgical History:  Procedure Laterality Date  ABDOMINAL HYSTERECTOMY  04/2013   BREAST BIOPSY Bilateral 10/05/2000   BREAST BIOPSY Right 08/26/2019   Fibroadenoma   BREAST EXCISIONAL BIOPSY Left 1991   CESAREAN SECTION  2002; 2006   COLONOSCOPY     LAPAROSCOPIC OVARIAN CYSTECTOMY  ~ 1999   NASAL SEPTUM SURGERY  ~ 1991   OOPHORECTOMY Right 2015   TEMPOROMANDIBULAR JOINT SURGERY  ~ 1992   UPPER GASTROINTESTINAL ENDOSCOPY      Family History  Problem Relation Age of Onset   Prostate cancer Paternal Grandfather    Colon polyps Paternal Grandfather    Colon polyps Father    Heart disease Father    Diverticulitis Mother    Colon polyps Paternal Grandmother    Breast cancer Paternal Grandmother    Colon cancer Neg Hx    Esophageal cancer Neg Hx    Rectal  cancer Neg Hx    Stomach cancer Neg Hx    Pancreatic cancer Neg Hx     Allergies  Allergen Reactions   Promethazine Hcl Other (See Comments)    Violent tremors    Cephalexin Diarrhea and Itching   Codeine Other (See Comments)    Was a child when she took this.   Dilaudid [Hydromorphone] Nausea And Vomiting   Soy Allergy Itching   Azithromycin Rash    Head to toe rash    Current Outpatient Medications on File Prior to Visit  Medication Sig Dispense Refill   albuterol (VENTOLIN HFA) 108 (90 Base) MCG/ACT inhaler Inhale 2 puffs into the lungs every 6 (six) hours as needed for wheezing or shortness of breath. 8 g 2   azelastine (ASTELIN) 0.1 % nasal spray Place 1 spray into both nostrils 2 (two) times daily. Use in each nostril as directed 30 mL 3   budesonide-formoterol (SYMBICORT) 160-4.5 MCG/ACT inhaler Inhale 2 puffs into the lungs 2 (two) times daily. 1 each 12   celecoxib (CELEBREX) 200 MG capsule Take 200 mg by mouth 2 (two) times daily.     fluticasone (FLONASE) 50 MCG/ACT nasal spray SPRAY 2 SPRAYS INTO EACH NOSTRIL EVERY DAY 16 mL 5   lansoprazole (PREVACID) 30 MG capsule Take 30 mg by mouth as needed.     loratadine (CLARITIN) 10 MG tablet Take 1 tablet (10 mg total) by mouth daily. 30 tablet 0   montelukast (SINGULAIR) 10 MG tablet Take 1 tablet (10 mg total) by mouth at bedtime. 30 tablet 3   mupirocin ointment (BACTROBAN) 2 % Apply 1 Application topically 2 (two) times daily. 22 g 0   No current facility-administered medications on file prior to visit.    BP 120/70   Pulse 78   Temp 98.2 F (36.8 C)   Resp 18   Ht '5\' 4"'$  (1.626 m)   Wt 224 lb 9.6 oz (101.9 kg)   LMP 02/08/2013   SpO2 98%   BMI 38.55 kg/m        Objective:   Physical Exam  General Mental Status- Alert. General Appearance- Not in acute distress.   Skin General: Color- Normal Color. Moisture- Normal Moisture.  Neck Carotid Arteries- Normal color. Moisture- Normal Moisture. No carotid  bruits. No JVD.  Chest and Lung Exam Auscultation: Breath Sounds:-Normal.  Cardiovascular Auscultation:Rythm- Regular. Murmurs & Other Heart Sounds:Auscultation of the heart reveals- No Murmurs.  Abdomen Inspection:-Inspeection Normal. Palpation/Percussion:Note:No mass. Palpation and Percussion of the abdomen reveal- Non Tender, Non Distended + BS, no rebound or guarding.   Neurologic Cranial Nerve exam:- CN III-XII intact(No nystagmus), symmetric  smile. Strength:- 5/5 equal and symmetric strength both upper and lower extremities.       Assessment & Plan:   Patient Instructions  For you wellness exam today I have ordered cbc, cmp and  lipid panel.  For fatigue adding tsh, t4, b12 and b1 level.  For vit d deficiency in past will get vit d level.  Declined flu vaccine.   Recommend exercise and healthy diet.  We will let you know lab results as they come in.  Follow up date appointment will be determined after lab review.        Mackie Pai, PA-C

## 2022-03-10 NOTE — Addendum Note (Signed)
Addended by: Anabel Halon on: 03/10/2022 06:42 PM   Modules accepted: Orders

## 2022-03-11 ENCOUNTER — Other Ambulatory Visit (INDEPENDENT_AMBULATORY_CARE_PROVIDER_SITE_OTHER): Payer: BC Managed Care – PPO

## 2022-03-11 DIAGNOSIS — Z0000 Encounter for general adult medical examination without abnormal findings: Secondary | ICD-10-CM | POA: Diagnosis not present

## 2022-03-11 NOTE — Addendum Note (Signed)
Addended by: Anabel Halon on: 03/11/2022 12:03 PM   Modules accepted: Orders

## 2022-03-11 NOTE — Addendum Note (Signed)
Addended by: Kelle Darting A on: 03/11/2022 02:36 PM   Modules accepted: Orders

## 2022-03-12 LAB — COMPREHENSIVE METABOLIC PANEL
ALT: 14 U/L (ref 0–35)
AST: 13 U/L (ref 0–37)
Albumin: 4.3 g/dL (ref 3.5–5.2)
Alkaline Phosphatase: 99 U/L (ref 39–117)
BUN: 9 mg/dL (ref 6–23)
CO2: 26 mEq/L (ref 19–32)
Calcium: 9.5 mg/dL (ref 8.4–10.5)
Chloride: 104 mEq/L (ref 96–112)
Creatinine, Ser: 0.97 mg/dL (ref 0.40–1.20)
GFR: 68.55 mL/min (ref >60.00)
Glucose, Bld: 94 mg/dL (ref 70–99)
Potassium: 4.7 mEq/L (ref 3.5–5.1)
Sodium: 142 mEq/L (ref 135–145)
Total Bilirubin: 0.2 mg/dL (ref 0.2–1.2)
Total Protein: 7.3 g/dL (ref 6.0–8.3)

## 2022-03-12 LAB — LIPID PANEL
Cholesterol: 244 mg/dL — ABNORMAL HIGH (ref 0–200)
HDL: 53 mg/dL (ref >39.00)
LDL Cholesterol: 168 mg/dL — ABNORMAL HIGH (ref 0–99)
NonHDL: 190.64
Total CHOL/HDL Ratio: 5
Triglycerides: 115 mg/dL (ref 0.0–149.0)
VLDL: 23 mg/dL (ref 0.0–40.0)

## 2022-03-13 LAB — VITAMIN B1: Vitamin B1 (Thiamine): 8 nmol/L (ref 8–30)

## 2022-03-13 NOTE — Addendum Note (Signed)
Addended by: Anabel Halon on: 03/13/2022 03:13 PM   Modules accepted: Orders

## 2022-03-14 MED ORDER — ATORVASTATIN CALCIUM 10 MG PO TABS: 10.0000 mg | ORAL_TABLET | Freq: Every day | ORAL | 3 refills | Status: DC

## 2022-03-14 NOTE — Addendum Note (Signed)
Addended by: Anabel Halon on: 03/14/2022 11:58 AM   Modules accepted: Orders

## 2022-03-18 DIAGNOSIS — Z1231 Encounter for screening mammogram for malignant neoplasm of breast: Secondary | ICD-10-CM | POA: Diagnosis not present

## 2022-03-18 LAB — HM MAMMOGRAPHY: HM Mammogram: NORMAL (ref 0–4)

## 2022-03-19 ENCOUNTER — Other Ambulatory Visit (INDEPENDENT_AMBULATORY_CARE_PROVIDER_SITE_OTHER): Payer: BC Managed Care – PPO

## 2022-03-19 DIAGNOSIS — Z0000 Encounter for general adult medical examination without abnormal findings: Secondary | ICD-10-CM | POA: Diagnosis not present

## 2022-03-19 LAB — CBC WITH DIFFERENTIAL/PLATELET
Basophils Absolute: 0 10*3/uL (ref 0.0–0.1)
Basophils Relative: 0.7 % (ref 0.0–3.0)
Eosinophils Absolute: 0.2 10*3/uL (ref 0.0–0.7)
Eosinophils Relative: 3.5 % (ref 0.0–5.0)
HCT: 40.8 % (ref 36.0–46.0)
Hemoglobin: 13.5 g/dL (ref 12.0–15.0)
Lymphocytes Relative: 23.5 % (ref 12.0–46.0)
Lymphs Abs: 1.5 10*3/uL (ref 0.7–4.0)
MCHC: 33 g/dL (ref 30.0–36.0)
MCV: 84.3 fl (ref 78.0–100.0)
Monocytes Absolute: 0.5 10*3/uL (ref 0.1–1.0)
Monocytes Relative: 7.1 % (ref 3.0–12.0)
Neutro Abs: 4.2 10*3/uL (ref 1.4–7.7)
Neutrophils Relative %: 65.2 % (ref 43.0–77.0)
Platelets: 378 10*3/uL (ref 150.0–400.0)
RBC: 4.84 Mil/uL (ref 3.87–5.11)
RDW: 14 % (ref 11.5–15.5)
WBC: 6.5 10*3/uL (ref 4.0–10.5)

## 2022-03-19 NOTE — Progress Notes (Unsigned)
3

## 2022-03-23 DIAGNOSIS — G4733 Obstructive sleep apnea (adult) (pediatric): Secondary | ICD-10-CM | POA: Diagnosis not present

## 2022-03-25 DIAGNOSIS — R21 Rash and other nonspecific skin eruption: Secondary | ICD-10-CM | POA: Diagnosis not present

## 2022-03-25 DIAGNOSIS — B351 Tinea unguium: Secondary | ICD-10-CM | POA: Diagnosis not present

## 2022-03-28 ENCOUNTER — Ambulatory Visit (INDEPENDENT_AMBULATORY_CARE_PROVIDER_SITE_OTHER): Payer: BC Managed Care – PPO

## 2022-03-28 DIAGNOSIS — E538 Deficiency of other specified B group vitamins: Secondary | ICD-10-CM | POA: Diagnosis not present

## 2022-03-28 MED ORDER — CYANOCOBALAMIN 1000 MCG/ML IJ SOLN
1000.0000 ug | Freq: Once | INTRAMUSCULAR | Status: AC
  Administered 2022-03-28: 1000 ug via INTRAMUSCULAR

## 2022-03-28 NOTE — Progress Notes (Addendum)
Lorraine Conrad is a 51 y.o. female presents to the office today for 1/4 weekly B12:injection, per physician's orders. Original order: 03/10/22: " Recommend getting set up for weekly b12 vitamin injection over next 4 weeks followed by monthly injection for 5 months. Then repeat b12 level in 6 months. Hopefully you will see improvement in your energy as b12 increased." Cyanocobalamin 1000 mg IM, was administered L Deltoid today. Patient tolerated injection. Patient due for follow up labs/provider appt: No.  Patient next injection due: 1 week, appt made Yes  Creft, Margie Ege, PA-C

## 2022-04-03 ENCOUNTER — Ambulatory Visit: Payer: BC Managed Care – PPO

## 2022-04-03 ENCOUNTER — Ambulatory Visit (INDEPENDENT_AMBULATORY_CARE_PROVIDER_SITE_OTHER): Payer: BC Managed Care – PPO

## 2022-04-03 DIAGNOSIS — E538 Deficiency of other specified B group vitamins: Secondary | ICD-10-CM

## 2022-04-03 MED ORDER — CYANOCOBALAMIN 1000 MCG/ML IJ SOLN
1000.0000 ug | Freq: Once | INTRAMUSCULAR | Status: AC
  Administered 2022-04-03: 1000 ug via INTRAMUSCULAR

## 2022-04-03 NOTE — Progress Notes (Addendum)
   Pt received b12 in right deltoid, when giving injection, pt moved and said medicine stung going in. I voiced to the pt that I'm sorry but she can't move on me, because then ill have to restick her. As I continued to push medicine, she kept moving over but tolerated well.   Mackie Pai, PA-C

## 2022-04-10 ENCOUNTER — Ambulatory Visit: Payer: BC Managed Care – PPO

## 2022-04-10 ENCOUNTER — Ambulatory Visit (INDEPENDENT_AMBULATORY_CARE_PROVIDER_SITE_OTHER): Payer: BC Managed Care – PPO

## 2022-04-10 DIAGNOSIS — E538 Deficiency of other specified B group vitamins: Secondary | ICD-10-CM | POA: Diagnosis not present

## 2022-04-10 MED ORDER — CYANOCOBALAMIN 1000 MCG/ML IJ SOLN
1000.0000 ug | Freq: Once | INTRAMUSCULAR | Status: AC
  Administered 2022-04-10: 1000 ug via INTRAMUSCULAR

## 2022-04-10 NOTE — Progress Notes (Signed)
Lorraine Conrad is a 51 y.o. female presents to the office today for 3/4 weekly B12:injection, per physician's orders.  Original order: 03/10/22: " Recommend getting set up for weekly b12 vitamin injection over next 4 weeks followed by monthly injection for 5 months. Then repeat b12 level in 6 months. Hopefully you will see improvement in your energy as b12 increased."  Cyanocobalamin 1000 mg IM, was administered L Deltoid today. Patient tolerated injection.  Patient due for follow up labs/provider appt: No.  Patient next injection due: 1 week, appt made Yes     [facility]  Kittie Plater, RMA     Mackie Pai, PA-C

## 2022-04-17 ENCOUNTER — Ambulatory Visit: Payer: BC Managed Care – PPO

## 2022-04-17 ENCOUNTER — Ambulatory Visit (INDEPENDENT_AMBULATORY_CARE_PROVIDER_SITE_OTHER): Payer: BC Managed Care – PPO

## 2022-04-17 DIAGNOSIS — E538 Deficiency of other specified B group vitamins: Secondary | ICD-10-CM | POA: Diagnosis not present

## 2022-04-17 MED ORDER — CYANOCOBALAMIN 1000 MCG/ML IJ SOLN
1000.0000 ug | Freq: Once | INTRAMUSCULAR | Status: AC
  Administered 2022-04-17: 1000 ug via INTRAMUSCULAR

## 2022-04-17 NOTE — Progress Notes (Addendum)
Pt here for week 4 of 4 B12 injection per Percell Miller.   B12 1059mg given L deltoid IM, and pt tolerated injection well.  Next B12 injection scheduled for 1 month.    EMackie Pai PA-C

## 2022-04-23 DIAGNOSIS — G4733 Obstructive sleep apnea (adult) (pediatric): Secondary | ICD-10-CM | POA: Diagnosis not present

## 2022-05-12 DIAGNOSIS — L719 Rosacea, unspecified: Secondary | ICD-10-CM | POA: Diagnosis not present

## 2022-05-12 DIAGNOSIS — B351 Tinea unguium: Secondary | ICD-10-CM | POA: Diagnosis not present

## 2022-05-13 ENCOUNTER — Telehealth: Payer: Self-pay | Admitting: *Deleted

## 2022-05-13 ENCOUNTER — Encounter: Payer: Self-pay | Admitting: *Deleted

## 2022-05-13 NOTE — Telephone Encounter (Signed)
ATC patient, unable to leave a vm as the mailbox was full.  Sent mychart message requesting she bring her SD card with her to her OV tomorrow.

## 2022-05-14 ENCOUNTER — Telehealth: Payer: Self-pay | Admitting: *Deleted

## 2022-05-14 ENCOUNTER — Ambulatory Visit (INDEPENDENT_AMBULATORY_CARE_PROVIDER_SITE_OTHER): Payer: BC Managed Care – PPO | Admitting: Adult Health

## 2022-05-14 ENCOUNTER — Encounter: Payer: Self-pay | Admitting: Adult Health

## 2022-05-14 VITALS — BP 118/68 | HR 99 | Temp 98.0°F | Ht 64.0 in | Wt 228.0 lb

## 2022-05-14 DIAGNOSIS — G4733 Obstructive sleep apnea (adult) (pediatric): Secondary | ICD-10-CM

## 2022-05-14 NOTE — Progress Notes (Signed)
$'@Patient'a$  ID: Lorraine Conrad, female    DOB: 1970/09/14, 51 y.o.   MRN: [id number]  Chief Complaint  Patient presents with   Follow-up    Referring provider: Elise Benne  HPI: 50 year old female followed for obstructive sleep apnea  TEST/EVENTS :  Pulmonary testing: Methacholine challenge 12/31/15 >> negative   Sleep tests:  PSG 02/04/18 >> AHI 27.9, SpO2 low 83% HST 08/05/21 >> AHI 52.2, SpO2 low 74% Overnight oximetry test on CPAP September 26, 2021 showed no significant desaturations.  O2 saturations remained above 90% during the test with only 8 seconds below 88%.     Cardiac testing: Echo 09/27/15 >> EF 55 to 60%, grade 1 DD, mild MR CPST 01/11/16 >> deconditioning  05/14/2022 Follow up : OSA  Patient presents for a 65-monthfollow-up.  Patient is on nocturnal CPAP.  Says she wears her CPAP every single night.  CPAP download has been requested.  Overall she says she is doing well. Wears it all night long . Can not sleep without it. Feels she benefits from CPAP with decreased daytime sleepiness. Using full face mask. . Wants to try different mask, leaks a lot and blows into eyes. Reviewed different mask, will try dream wear full face.  We have contacted DME for download .  Patient says she is trying to be more active and work on weight loss.    Allergies  Allergen Reactions   Promethazine Hcl Other (See Comments)    Violent tremors    Cephalexin Diarrhea and Itching   Codeine Other (See Comments)    Was a child when she took this.   Dilaudid [Hydromorphone] Nausea And Vomiting   Soy Allergy Itching   Azithromycin Rash    Head to toe rash    Immunization History  Administered Date(s) Administered   Influenza-Unspecified 08/04/2015   PFIZER(Purple Top)SARS-COV-2 Vaccination 08/14/2019, 09/14/2019, 06/04/2020   Td 12/11/2014   Tdap 01/16/2014    Past Medical History:  Diagnosis Date   Allergy    SEASONAL   Anemia    Arthritis    "spine" (03/01/2014)    Asthma    Chronic lower back pain    Endometriosis    Erosive gastritis    External hemorrhoids    GERD (gastroesophageal reflux disease)    H/O shortness of breath 2014   Cardiopulmonary exercise test results   Headache    "probably monthly" (03/01/2014)   Migraine 1998; 02/2014   "this one's lasted 10 days straight" (03/01/2014)   OSA on CPAP    Ovarian cyst, left    Proctalgia fugax    Sleep apnea    on cpap   Thyroid disease    thyroid nodule    Tobacco History: Social History   Tobacco Use  Smoking Status Never  Smokeless Tobacco Never  Tobacco Comments   father smoked in house growing up per pt.    Counseling given: Not Answered Tobacco comments: father smoked in house growing up per pt.    Outpatient Medications Prior to Visit  Medication Sig Dispense Refill   albuterol (VENTOLIN HFA) 108 (90 Base) MCG/ACT inhaler Inhale 2 puffs into the lungs every 6 (six) hours as needed for wheezing or shortness of breath. 8 g 2   atorvastatin (LIPITOR) 10 MG tablet Take 1 tablet (10 mg total) by mouth daily. 30 tablet 3   azelastine (ASTELIN) 0.1 % nasal spray Place 1 spray into both nostrils 2 (two) times daily. Use in each nostril as directed 30  mL 3   budesonide-formoterol (SYMBICORT) 160-4.5 MCG/ACT inhaler Inhale 2 puffs into the lungs 2 (two) times daily. 1 each 12   celecoxib (CELEBREX) 200 MG capsule Take 200 mg by mouth 2 (two) times daily.     CYANOCOBALAMIN IJ Inject as directed. Once a month injection     fluticasone (FLONASE) 50 MCG/ACT nasal spray SPRAY 2 SPRAYS INTO EACH NOSTRIL EVERY DAY 16 mL 5   lansoprazole (PREVACID) 30 MG capsule Take 30 mg by mouth as needed.     loratadine (CLARITIN) 10 MG tablet Take 1 tablet (10 mg total) by mouth daily. 30 tablet 0   mupirocin ointment (BACTROBAN) 2 % Apply 1 Application topically 2 (two) times daily. 22 g 0   Vitamin D, Ergocalciferol, (DRISDOL) 1.25 MG (50000 UNIT) CAPS capsule Take 1 capsule (50,000 Units total) by  mouth every 7 (seven) days. 8 capsule 0   montelukast (SINGULAIR) 10 MG tablet Take 1 tablet (10 mg total) by mouth at bedtime. (Patient not taking: Reported on 05/14/2022) 30 tablet 3   No facility-administered medications prior to visit.     Review of Systems:   Constitutional:   No  weight loss, night sweats,  Fevers, chills, fatigue, or  lassitude.  HEENT:   No headaches,  Difficulty swallowing,  Tooth/dental problems, or  Sore throat,                No sneezing, itching, ear ache, nasal congestion, post nasal drip,   CV:  No chest pain,  Orthopnea, PND, swelling in lower extremities, anasarca, dizziness, palpitations, syncope.   GI  No heartburn, indigestion, abdominal pain, nausea, vomiting, diarrhea, change in bowel habits, loss of appetite, bloody stools.   Resp: No shortness of breath with exertion or at rest.  No excess mucus, no productive cough,  No non-productive cough,  No coughing up of blood.  No change in color of mucus.  No wheezing.  No chest wall deformity  Skin: no rash or lesions.  GU: no dysuria, change in color of urine, no urgency or frequency.  No flank pain, no hematuria   MS:  No joint pain or swelling.  No decreased range of motion.  No back pain.    Physical Exam  BP 118/68 (BP Location: Left Arm, Cuff Size: Large)   Pulse 99   Temp 98 F (36.7 C) (Temporal)   Ht '5\' 4"'$  (1.626 m)   Wt 228 lb (103.4 kg)   LMP 02/08/2013   SpO2 97%   BMI 39.14 kg/m   GEN: A/Ox3; pleasant , NAD, well nourished    HEENT:  [place]/AT,   NOSE-clear, THROAT-clear, no lesions, no postnasal drip or exudate noted.  Class III MP airway  NECK:  Supple w/ fair ROM; no JVD; normal carotid impulses w/o bruits; no thyromegaly or nodules palpated; no lymphadenopathy.    RESP  Clear  P & A; w/o, wheezes/ rales/ or rhonchi. no accessory muscle use, no dullness to percussion  CARD:  RRR, no m/r/g, no peripheral edema, pulses intact, no cyanosis or clubbing.  GI:   Soft & nt; nml  bowel sounds; no organomegaly or masses detected.   Musco: Warm bil, no deformities or joint swelling noted.   Neuro: alert, no focal deficits noted.    Skin: Warm, no lesions or rashes    Lab Results: ProBNP No results found for: "PROBNP"       Latest Ref Rng & Units 12/31/2015    1:01 PM  PFT Results  FVC-Pre L 2.98   FVC-Predicted Pre % 81   FVC-Post L 2.84   FVC-Predicted Post % 77   Pre FEV1/FVC % % 88   Post FEV1/FCV % % 89   FEV1-Pre L 2.62   FEV1-Predicted Pre % 88   FEV1-Post L 2.52     Lab Results  Component Value Date   NITRICOXIDE 16 12/25/2015        Assessment & Plan:   OSA (obstructive sleep apnea) Patient is continue on CPAP.  CPAP download is been requested.  Has perceived benefit on CPAP  Plan  Patient Instructions  Wear CPAP At bedtime  -all night  Work on healthy sleep regimen  Do not drive if sleepy   Work on healthy weight loss.  Activity as tolerated.  Follow up with Dr. Halford Chessman or Brylee Berk NP in 1 year and As needed        Rexene Edison, NP 05/14/2022

## 2022-05-14 NOTE — Assessment & Plan Note (Signed)
Patient is continue on CPAP.  CPAP download is been requested.  Has perceived benefit on CPAP  Plan  Patient Instructions  Wear CPAP At bedtime  -all night  Work on healthy sleep regimen  Do not drive if sleepy   Work on healthy weight loss.  Activity as tolerated.  Follow up with Dr. Halford Chessman or Annlee Glandon NP in 1 year and As needed

## 2022-05-14 NOTE — Patient Instructions (Signed)
Wear CPAP At bedtime  -all night  Work on healthy sleep regimen  Do not drive if sleepy   Work on healthy weight loss.  Activity as tolerated.  Follow up with Dr. Halford Chessman or Taneil Lazarus NP in 1 year and As needed

## 2022-05-14 NOTE — Telephone Encounter (Signed)
Spoke with Denae at [employer] to request a download for CPAP usage. Pt. Was seen for OV today. Provided fax number to send report.

## 2022-05-15 ENCOUNTER — Ambulatory Visit: Payer: BC Managed Care – PPO

## 2022-05-22 ENCOUNTER — Ambulatory Visit (INDEPENDENT_AMBULATORY_CARE_PROVIDER_SITE_OTHER): Payer: BC Managed Care – PPO | Admitting: *Deleted

## 2022-05-22 DIAGNOSIS — E538 Deficiency of other specified B group vitamins: Secondary | ICD-10-CM

## 2022-05-22 MED ORDER — CYANOCOBALAMIN 1000 MCG/ML IJ SOLN
1000.0000 ug | Freq: Once | INTRAMUSCULAR | Status: AC
  Administered 2022-05-22: 1000 ug via INTRAMUSCULAR

## 2022-05-22 NOTE — Progress Notes (Signed)
Pt here for 1 of 5 monthly B12 injection per Mackie Pai PA-C    B12 1056mg given L deltoid IM, and pt tolerated injection well.   Next B12 injection scheduled for 1 month.

## 2022-05-23 DIAGNOSIS — G4733 Obstructive sleep apnea (adult) (pediatric): Secondary | ICD-10-CM | POA: Diagnosis not present

## 2022-06-19 NOTE — Progress Notes (Addendum)
Lorraine Conrad is a 51 y.o. female presents to the office today for 2/5 monthly B12:injection, per physician's orders. Original order: 03/10/22: " Recommend getting set up for weekly b12 vitamin injection over next 4 weeks followed by monthly injection for 5 months. Then repeat b12 level in 6 months. Hopefully you will see improvement in your energy as b12 increased." Cyanocobalamin 1000 mg IM, was administered L Deltoid today. Patient tolerated injection. Patient due for follow up labs/provider appt: No.  Patient next injection due: 1 month, appt made Yes  Lab appointment already scheduled.    Creft, Margie Ege, PA-C

## 2022-06-20 ENCOUNTER — Ambulatory Visit (INDEPENDENT_AMBULATORY_CARE_PROVIDER_SITE_OTHER): Payer: BC Managed Care – PPO

## 2022-06-20 DIAGNOSIS — E538 Deficiency of other specified B group vitamins: Secondary | ICD-10-CM

## 2022-06-20 MED ORDER — CYANOCOBALAMIN 1000 MCG/ML IJ SOLN
1000.0000 ug | Freq: Once | INTRAMUSCULAR | Status: AC
  Administered 2022-06-20: 1000 ug via INTRAMUSCULAR

## 2022-07-22 ENCOUNTER — Ambulatory Visit (INDEPENDENT_AMBULATORY_CARE_PROVIDER_SITE_OTHER): Payer: BC Managed Care – PPO | Admitting: *Deleted

## 2022-07-22 DIAGNOSIS — E538 Deficiency of other specified B group vitamins: Secondary | ICD-10-CM | POA: Diagnosis not present

## 2022-07-22 MED ORDER — CYANOCOBALAMIN 1000 MCG/ML IJ SOLN
1000.0000 ug | Freq: Once | INTRAMUSCULAR | Status: AC
  Administered 2022-07-22: 1000 ug via INTRAMUSCULAR

## 2022-07-22 NOTE — Progress Notes (Signed)
Pt here for 3/5 monthly B12 injection per Mackie Pai, PA-C  B12 1026mg given IM, and pt tolerated injection well.  Next B12 injection scheduled for 08/21/22.

## 2022-08-04 DIAGNOSIS — D225 Melanocytic nevi of trunk: Secondary | ICD-10-CM | POA: Diagnosis not present

## 2022-08-04 DIAGNOSIS — L258 Unspecified contact dermatitis due to other agents: Secondary | ICD-10-CM | POA: Diagnosis not present

## 2022-08-04 DIAGNOSIS — Z1283 Encounter for screening for malignant neoplasm of skin: Secondary | ICD-10-CM | POA: Diagnosis not present

## 2022-08-11 ENCOUNTER — Other Ambulatory Visit (INDEPENDENT_AMBULATORY_CARE_PROVIDER_SITE_OTHER): Payer: BC Managed Care – PPO

## 2022-08-11 DIAGNOSIS — E538 Deficiency of other specified B group vitamins: Secondary | ICD-10-CM

## 2022-08-11 LAB — VITAMIN B12: Vitamin B-12: 533 pg/mL (ref 211–911)

## 2022-08-18 ENCOUNTER — Encounter: Payer: Self-pay | Admitting: Medical

## 2022-08-19 ENCOUNTER — Ambulatory Visit: Payer: BC Managed Care – PPO

## 2022-08-19 ENCOUNTER — Ambulatory Visit (INDEPENDENT_AMBULATORY_CARE_PROVIDER_SITE_OTHER): Payer: BC Managed Care – PPO | Admitting: *Deleted

## 2022-08-19 DIAGNOSIS — E538 Deficiency of other specified B group vitamins: Secondary | ICD-10-CM

## 2022-08-19 MED ORDER — CYANOCOBALAMIN 1000 MCG/ML IJ SOLN
1000.0000 ug | Freq: Once | INTRAMUSCULAR | Status: AC
  Administered 2022-08-19: 1000 ug via INTRAMUSCULAR

## 2022-08-19 NOTE — Progress Notes (Signed)
Pt here for 4/5 monthly B12 injection per Mackie Pai, PA-C   B12 106mg given IM left deltoid and pt tolerated injection well.   Next B12 injection scheduled for 09/19/22.

## 2022-08-21 ENCOUNTER — Ambulatory Visit: Payer: BC Managed Care – PPO

## 2022-08-25 ENCOUNTER — Encounter: Payer: Self-pay | Admitting: Podiatry

## 2022-08-25 ENCOUNTER — Ambulatory Visit (INDEPENDENT_AMBULATORY_CARE_PROVIDER_SITE_OTHER): Payer: BC Managed Care – PPO | Admitting: Podiatry

## 2022-08-25 ENCOUNTER — Ambulatory Visit (INDEPENDENT_AMBULATORY_CARE_PROVIDER_SITE_OTHER): Payer: BC Managed Care – PPO | Admitting: Medical

## 2022-08-25 VITALS — BP 125/78 | HR 80 | Temp 98.0°F | Resp 18 | Ht 64.0 in | Wt 217.0 lb

## 2022-08-25 DIAGNOSIS — B351 Tinea unguium: Secondary | ICD-10-CM

## 2022-08-25 DIAGNOSIS — R739 Hyperglycemia, unspecified: Secondary | ICD-10-CM

## 2022-08-25 DIAGNOSIS — E785 Hyperlipidemia, unspecified: Secondary | ICD-10-CM

## 2022-08-25 DIAGNOSIS — E559 Vitamin D deficiency, unspecified: Secondary | ICD-10-CM

## 2022-08-25 DIAGNOSIS — R1032 Left lower quadrant pain: Secondary | ICD-10-CM | POA: Diagnosis not present

## 2022-08-25 DIAGNOSIS — M5442 Lumbago with sciatica, left side: Secondary | ICD-10-CM

## 2022-08-25 MED ORDER — METHOCARBAMOL 750 MG PO TABS: 750.0000 mg | ORAL_TABLET | Freq: Three times a day (TID) | ORAL | 0 refills | Status: DC | PRN

## 2022-08-25 MED ORDER — KETOROLAC TROMETHAMINE 60 MG/2ML IM SOLN
60.0000 mg | Freq: Once | INTRAMUSCULAR | Status: AC
  Administered 2022-08-25: 60 mg via INTRAMUSCULAR

## 2022-08-25 MED ORDER — CICLOPIROX 8 % EX SOLN: Freq: Every day | CUTANEOUS | 0 refills | Status: DC

## 2022-08-25 NOTE — Addendum Note (Signed)
Addended by: Manuela Schwartz on: 08/25/2022 02:12 PM   Modules accepted: Orders

## 2022-08-25 NOTE — Patient Instructions (Signed)

## 2022-08-25 NOTE — Progress Notes (Signed)
Subjective:    Patient ID: Lorraine Conrad, female    DOB: [birth year], 52 y.o.   MRN: [id number]  HPI  Pt in with lower rt side  back pain with some rt side hip area pain as well.  Pt states back hurts worse. Rt hip/groin area hurts when starts to ambulate.  Pt had no injury or fall. Pt state pain after carrying her pocket book with a lot of bottles of water and snack. Pt states was too heavy for her to carry comfortably.  Pt states since 2016 she has had intermittent episodes in the past. Can respond wll to toradol and taper prednisone in the past.   On review prior mri in 2016  IMPRESSION: 1. Minimal degenerative annular disc bulge at L4-5 and L5-S1 without significant stenosis. 2. Mild bilateral facet arthrosis at L4-5. 3. Otherwise normal MRI of the lumbar spine. No significant canal or foraminal stenosis. No neural impingement. 4. Complex cystic left ovarian lesion, incompletely evaluated on this exam. Further evaluation with dedicated pelvic ultrasound could be performed for further assessment as clinically desired.  Pt has iced area and is taking robaxin.  Pt states Saturday pain subsided as she rested a lot watching basketball. But Sunday morning pain was moderate again. No pain raidiating to legs but some pain that radiates to left buttox and down toward coccyx. No leg weakness. No incontinence.  Elevated blood sugar. No hx of diabetes.  Pt has high cholesterol- she just start atorvastatin.       Review of Systems  Constitutional:  Negative for chills, fatigue and fever.  HENT:  Negative for congestion and dental problem.   Respiratory:  Negative for cough, chest tightness, shortness of breath and wheezing.   Cardiovascular:  Negative for chest pain and palpitations.  Gastrointestinal:  Negative for abdominal pain and nausea.  Genitourinary:  Negative for dyspareunia, dysuria, enuresis, frequency and hematuria.  Musculoskeletal:  Positive for back pain.        Rt groin area pain occasionally.  Skin:  Negative for rash.  Psychiatric/Behavioral:  Negative for behavioral problems and decreased concentration. The patient is not nervous/anxious.    Past Medical History:  Diagnosis Date   Allergy    SEASONAL   Anemia    Arthritis    "spine" (03/01/2014)   Asthma    Chronic lower back pain    Endometriosis    Erosive gastritis    External hemorrhoids    GERD (gastroesophageal reflux disease)    H/O shortness of breath 2014   Cardiopulmonary exercise test results   Headache    "probably monthly" (03/01/2014)   Migraine 1998; 02/2014   "this one's lasted 10 days straight" (03/01/2014)   OSA on CPAP    Ovarian cyst, left    Proctalgia fugax    Sleep apnea    on cpap   Thyroid disease    thyroid nodule     Social History   Socioeconomic History   Marital status: Married    Spouse name: Not on file   Number of children: 2   Years of education: Not on file   Highest education level: Not on file  Occupational History   Occupation: homemaker  Tobacco Use   Smoking status: Never   Smokeless tobacco: Never   Tobacco comments:    father smoked in house growing up per pt.   Vaping Use   Vaping Use: Never used  Substance and Sexual Activity   Alcohol use: Yes  Alcohol/week: 0.0 standard drinks of alcohol    Comment: 12-23-16 "I'll have 1-2 drinks maybe 3-4 times/yr"   Drug use: No   Sexual activity: Yes    Birth control/protection: Surgical  Other Topics Concern   Not on file  Social History Narrative   Daily caffiene use: 6 cups per day   Patient does not get regular excercise   Social Determinants of Health   Financial Resource Strain: Not on file  Food Insecurity: Not on file  Transportation Needs: Not on file  Physical Activity: Not on file  Stress: Not on file  Social Connections: Not on file  Intimate Partner Violence: Not on file    Past Surgical History:  Procedure Laterality Date   ABDOMINAL HYSTERECTOMY   04/2013   BREAST BIOPSY Bilateral 10/05/2000   BREAST BIOPSY Right 08/26/2019   Fibroadenoma   BREAST EXCISIONAL BIOPSY Left 1991   CESAREAN SECTION  2002; 2006   COLONOSCOPY     LAPAROSCOPIC OVARIAN CYSTECTOMY  ~ [place]  ~ [place] Right 2015   TEMPOROMANDIBULAR JOINT SURGERY  ~ 1992   UPPER GASTROINTESTINAL ENDOSCOPY      Family History  Problem Relation Age of Onset   Prostate cancer Paternal Grandfather    Colon polyps Paternal Grandfather    Colon polyps Father    Heart disease Father    Diverticulitis Mother    Colon polyps Paternal Grandmother    Breast cancer Paternal Grandmother    Colon cancer Neg Hx    Esophageal cancer Neg Hx    Rectal cancer Neg Hx    Stomach cancer Neg Hx    Pancreatic cancer Neg Hx     Allergies  Allergen Reactions   Promethazine Hcl Other (See Comments)    Violent tremors    Cephalexin Diarrhea and Itching   Codeine Other (See Comments)    Was a child when she took this.   Dilaudid [Hydromorphone] Nausea And Vomiting   Soy Allergy Itching   Azithromycin Rash    Head to toe rash    Current Outpatient Medications on File Prior to Visit  Medication Sig Dispense Refill   albuterol (VENTOLIN HFA) 108 (90 Base) MCG/ACT inhaler Inhale 2 puffs into the lungs every 6 (six) hours as needed for wheezing or shortness of breath. 8 g 2   atorvastatin (LIPITOR) 10 MG tablet Take 1 tablet (10 mg total) by mouth daily. 30 tablet 3   azelastine (ASTELIN) 0.1 % nasal spray Place 1 spray into both nostrils 2 (two) times daily. Use in each nostril as directed 30 mL 3   budesonide-formoterol (SYMBICORT) 160-4.5 MCG/ACT inhaler Inhale 2 puffs into the lungs 2 (two) times daily. 1 each 12   celecoxib (CELEBREX) 200 MG capsule Take 200 mg by mouth 2 (two) times daily.     ciclopirox (PENLAC) 8 % solution Apply topically at bedtime. Apply over nail and surrounding skin. Apply daily over previous coat. After seven (7) days, may  remove with alcohol and continue cycle. 6.6 mL 0   CYANOCOBALAMIN IJ Inject as directed. Once a month injection     fluticasone (FLONASE) 50 MCG/ACT nasal spray SPRAY 2 SPRAYS INTO EACH NOSTRIL EVERY DAY 16 mL 5   lansoprazole (PREVACID) 30 MG capsule Take 30 mg by mouth as needed.     loratadine (CLARITIN) 10 MG tablet Take 1 tablet (10 mg total) by mouth daily. 30 tablet 0   montelukast (SINGULAIR) 10 MG tablet Take  1 tablet (10 mg total) by mouth at bedtime. (Patient not taking: Reported on 05/14/2022) 30 tablet 3   mupirocin ointment (BACTROBAN) 2 % Apply 1 Application topically 2 (two) times daily. 22 g 0   Vitamin D, Ergocalciferol, (DRISDOL) 1.25 MG (50000 UNIT) CAPS capsule Take 1 capsule (50,000 Units total) by mouth every 7 (seven) days. 8 capsule 0   No current facility-administered medications on file prior to visit.    BP 125/78   Pulse 80   Temp 98 F (36.7 C)   Resp 18   Ht 5\' 4"  (1.626 m)   Wt 217 lb (98.4 kg)   LMP 02/08/2013   SpO2 96%   BMI 37.25 kg/m          Objective:   Physical Exam  General Appearance- Not in acute distress.    Chest and Lung Exam Auscultation: Breath sounds:-Normal. Clear even and unlabored. Adventitious sounds:- No Adventitious sounds.  Cardiovascular Auscultation:Rythm - Regular, rate and rythm. Heart Sounds -Normal heart sounds.  Abdomen Inspection:-Inspection Normal.  Palpation/Perucssion: Palpation and Percussion of the abdomen reveal- Non Tender, No Rebound tenderness, No rigidity(Guarding) and No Palpable abdominal masses.  Liver:-Normal.  Spleen:- Normal.   Back Mid lumbar spine tenderness to palpation. Some pain radiating to left buttox Pain on straight leg lift. Pain on lateral movements and flexion/extension of the spine.  Lower ext neurologic  L5-S1 sensation intact bilaterally. Normal patellar reflexes bilaterally. No foot drop bilaterally.       Assessment & Plan:   Patient Instructions   Acute  midline low back pain with left-sided sciatica and Groin pain, left  Lumbar back pain- toradol 60 mg IM. Can continue robaxin. Refill today. After A1c review rx prednisone vs medrol. Will review blood work first before deciding. If pain persist past one week or worsen let us know. Will proceed with caution based on prior mri.  If left groin pain worsend get left hpi xray.   Elevated blood sugar Low sugar diet. - Hemoglobin A1c today.  4. Hyperlipidemia, unspecified hyperlipidemia type Cotinue arovastatin  - Comp Met (CMET); Future - Lipid panel; Future  5. Vitamin D deficiency Continue vit d 5000 iu(otc) 5 days a week - VITAMIN D 25 Hydroxy (Vit-D Deficiency, Fractures); Future   Follow up date to be determined based on response to tx. But follow up labs 7 weeks to check cmp, lipid panel and vit D level.    Mackie Pai, PA-C

## 2022-08-25 NOTE — Addendum Note (Signed)
Addended by: Jeronimo Greaves on: 08/25/2022 02:18 PM   Modules accepted: Orders

## 2022-08-25 NOTE — Patient Instructions (Addendum)
Acute midline low back pain with left-sided sciatica and Groin pain, left  Lumbar back pain- toradol 60 mg IM. Can continue robaxin. Refill today. After A1c review rx prednisone vs medrol. Will review blood work first before deciding. If pain persist past one week or worsen let us know. Will proceed with caution based on prior mri.  If left groin pain worsend get left hpi xray.   Elevated blood sugar Low sugar diet. - Hemoglobin A1c today.  4. Hyperlipidemia, unspecified hyperlipidemia type Cotinue arovastatin  - Comp Met (CMET); Future - Lipid panel; Future  5. Vitamin D deficiency Continue vit d 5000 iu(otc) 5 days a week - VITAMIN D 25 Hydroxy (Vit-D Deficiency, Fractures); Future   Follow up date to be determined based on response to tx. But follow up labs 7 weeks to check cmp, lipid panel and vit D level.

## 2022-08-25 NOTE — Progress Notes (Signed)
  Subjective:  Patient ID: Lorraine Conrad, female    DOB: August 26, 1970,   MRN: [id number]  Chief Complaint  Patient presents with   Nail Problem    Nail fungus     52 y.o. female presents for concern of nail fungus. Patient has been seen in the past for this with Dr. Cannon Kettle. Relates she had tried laser treatment and was getting better but unable to complete course. Has not tried anything recently. She does not want to do oral therapy  . Denies any other pedal complaints. Denies n/v/f/c.   Past Medical History:  Diagnosis Date   Allergy    SEASONAL   Anemia    Arthritis    "spine" (03/01/2014)   Asthma    Chronic lower back pain    Endometriosis    Erosive gastritis    External hemorrhoids    GERD (gastroesophageal reflux disease)    H/O shortness of breath 2014   Cardiopulmonary exercise test results   Headache    "probably monthly" (03/01/2014)   Migraine 1998; 02/2014   "this one's lasted 10 days straight" (03/01/2014)   OSA on CPAP    Ovarian cyst, left    Proctalgia fugax    Sleep apnea    on cpap   Thyroid disease    thyroid nodule    Objective:  Physical Exam: Vascular: DP/PT pulses 2/4 bilateral. CFT <3 seconds. Normal hair growth on digits. No edema.  Skin. No lacerations or abrasions bilateral feet. Bilateral hallux and fifth digit nails discolored and thickened with subungual debrsis  Musculoskeletal: MMT 5/5 bilateral lower extremities in DF, PF, Inversion and Eversion. Deceased ROM in DF of ankle joint.  Neurological: Sensation intact to light touch.   Assessment:  No diagnosis found.   Plan:  Patient was evaluated and treated and all questions answered. -Examined patient -Discussed treatment options for painful dystrophic nails  -Previous cultures were positive for fungus.  -Discussed fungal nail treatment options including oral, topical, and laser treatments.  Patient elects to try laser treatment again and will add on treatment with penlac as well to  expedite process.  -Patient to return in 3 months for recheck.    Lorenda Peck, DPM

## 2022-08-26 LAB — HEMOGLOBIN A1C: Hgb A1c MFr Bld: 5.6 % (ref 4.6–6.5)

## 2022-09-10 ENCOUNTER — Ambulatory Visit (INDEPENDENT_AMBULATORY_CARE_PROVIDER_SITE_OTHER): Payer: BC Managed Care – PPO | Admitting: *Deleted

## 2022-09-10 DIAGNOSIS — B351 Tinea unguium: Secondary | ICD-10-CM

## 2022-09-10 NOTE — Patient Instructions (Signed)

## 2022-09-10 NOTE — Progress Notes (Signed)
Patient presents today for the 1st laser treatment. Diagnosed with mycotic nail infection by Dr. Blenda Mounts.   Toenail most affected hallux and 5th bilateral and 3rd right. The hallux left is very loose.  All other systems are negative.  Nails were filed thin. Laser therapy was administered to 1-5 toenails bilateral and patient tolerated the treatment well. All safety precautions were in place.   Single laser pass was done on non-affected nails.  Patient is also using Penlac.   Follow up in 4 weeks for laser # 2.

## 2022-09-11 ENCOUNTER — Other Ambulatory Visit: Payer: Self-pay | Admitting: Medical

## 2022-09-16 ENCOUNTER — Encounter: Payer: Self-pay | Admitting: Family Medicine

## 2022-09-16 ENCOUNTER — Ambulatory Visit (INDEPENDENT_AMBULATORY_CARE_PROVIDER_SITE_OTHER): Payer: BC Managed Care – PPO | Admitting: Family Medicine

## 2022-09-16 VITALS — BP 128/74 | HR 60 | Ht 64.0 in | Wt 218.0 lb

## 2022-09-16 DIAGNOSIS — R21 Rash and other nonspecific skin eruption: Secondary | ICD-10-CM | POA: Diagnosis not present

## 2022-09-16 DIAGNOSIS — E538 Deficiency of other specified B group vitamins: Secondary | ICD-10-CM

## 2022-09-16 MED ORDER — METRONIDAZOLE 1 % EX GEL: Freq: Every day | CUTANEOUS | 1 refills | Status: DC

## 2022-09-16 MED ORDER — CYANOCOBALAMIN 1000 MCG/ML IJ SOLN: 1000.0000 ug | Freq: Once | INTRAMUSCULAR | Status: DC

## 2022-09-16 MED ORDER — CYANOCOBALAMIN 1000 MCG/ML IJ SOLN
1000.0000 ug | Freq: Once | INTRAMUSCULAR | Status: AC
  Administered 2022-09-16: 1000 ug via INTRAMUSCULAR

## 2022-09-16 NOTE — Progress Notes (Signed)
   Acute Office Visit  Subjective:     Patient ID: Lorraine Conrad, female    DOB: Sep 12, 1970, 52 y.o.   MRN: [id number]  Chief Complaint  Patient presents with   Rash     Patient is in today for rash and swelling.   Patient reports that for the past year, she has had intermittent episodes of eye/facial redness with some occasional swelling. She has followed with dermatology and been given metronidazole gel for roseca treatment which has worked in the past. Reports she gets several episodes per month. Last week she started with another flare - redness and swelling around her eyes/cheeks, but this time it started spreading a little further on the right check and was worse than previous episodes. Today it is mostly resolved, but still concerned her. She has not had any pain or vision changes. Redness and swelling is mostly gone now. No drainage or other rashes. No URI symptoms.      ROS All review of systems negative except what is listed in the HPI      Objective:    BP 128/74   Pulse 60   Ht 5\' 4"  (1.626 m)   Wt 218 lb (98.9 kg)   LMP 02/08/2013   SpO2 99%   BMI 37.42 kg/m    Physical Exam Vitals reviewed.  Constitutional:      Appearance: Normal appearance.  HENT:     Head: Normocephalic and atraumatic.  Skin:    Comments: Minimal erythema to right maxillary region, mostly resolved, no skin lesions/dryness  Neurological:     General: No focal deficit present.     Mental Status: She is alert and oriented to person, place, and time. Mental status is at baseline.  Psychiatric:        Mood and Affect: Mood normal.        Behavior: Behavior normal.        Thought Content: Thought content normal.        Judgment: Judgment normal.     No results found for any visits on 09/16/22.      Assessment & Plan:   Problem List Items Addressed This Visit   None Visit Diagnoses     Rash    -  Primary   Relevant Medications   metroNIDAZOLE (METROGEL) 1 % gel     -  Use a mild, unscented soap (Dove Sensitive Skin, Aveeno, Vanicream products). - Bathe every other day if possible. Use mildly warm water. Lightly pat dry, do not dry completely or rub skin. - After the bath apply fragrance-free moisturizer (Vaseline, Aveeno Eczema Therapy, Aquaphor, Eucerin, Vanicream, CeraVe cream, Cetaphil Restoraderm, [employer]). These moisturizers can be applied multiple times per day to keep the skin from looking dry.  - Use gentle, fragrance-free laundry detergents  - Use the gel as indicated for flares.  - Follow-up with dermatology for rosacea type rash   Meds ordered this encounter  Medications   DISCONTD: cyanocobalamin (VITAMIN B12) injection 1,000 mcg   metroNIDAZOLE (METROGEL) 1 % gel    Sig: Apply topically daily.    Dispense:  45 g    Refill:  1    Order Specific Question:   Supervising Provider    Answer:   Danise Edge A [[id number]]    Return if symptoms worsen or fail to improve.  Clayborne Dana, NP

## 2022-09-16 NOTE — Addendum Note (Signed)
Addended bySilvio Pate on: 09/16/2022 03:24 PM   Modules accepted: Orders

## 2022-09-16 NOTE — Patient Instructions (Addendum)
-   Use a mild, unscented soap (Dove Sensitive Skin, Aveeno, Vanicream products). - Bathe every other day if possible. Use mildly warm water. Lightly pat dry, do not dry completely or rub skin. - After the bath apply fragrance-free moisturizer (Vaseline, Aveeno Eczema Therapy, Aquaphor, Eucerin, Vanicream, CeraVe cream, Cetaphil Restoraderm, [employer]). These moisturizers can be applied multiple times per day to keep the skin from looking dry.  - Use gentle, fragrance-free laundry detergents  - Use the gel as indicated for flares.  - Follow-up with dermatology

## 2022-09-18 ENCOUNTER — Ambulatory Visit: Payer: BC Managed Care – PPO

## 2022-09-18 ENCOUNTER — Other Ambulatory Visit: Payer: Self-pay | Admitting: Medical

## 2022-09-18 DIAGNOSIS — D3121 Benign neoplasm of right retina: Secondary | ICD-10-CM | POA: Diagnosis not present

## 2022-09-19 ENCOUNTER — Ambulatory Visit: Payer: BC Managed Care – PPO

## 2022-09-22 ENCOUNTER — Encounter: Payer: Self-pay | Admitting: *Deleted

## 2022-09-23 DIAGNOSIS — Z1330 Encounter for screening examination for mental health and behavioral disorders, unspecified: Secondary | ICD-10-CM | POA: Diagnosis not present

## 2022-09-23 DIAGNOSIS — L719 Rosacea, unspecified: Secondary | ICD-10-CM | POA: Diagnosis not present

## 2022-09-30 ENCOUNTER — Ambulatory Visit (INDEPENDENT_AMBULATORY_CARE_PROVIDER_SITE_OTHER): Payer: BC Managed Care – PPO | Admitting: Medical

## 2022-09-30 ENCOUNTER — Ambulatory Visit (HOSPITAL_BASED_OUTPATIENT_CLINIC_OR_DEPARTMENT_OTHER)
Admission: RE | Admit: 2022-09-30 | Discharge: 2022-09-30 | Disposition: A | Discharge status: Home / Self Care | Payer: BC Managed Care – PPO | Source: Ambulatory Visit | Attending: Medical | Admitting: Medical

## 2022-09-30 VITALS — BP 134/74 | HR 73 | Temp 98.0°F | Resp 18 | Ht 64.0 in | Wt 222.8 lb

## 2022-09-30 DIAGNOSIS — M79662 Pain in left lower leg: Secondary | ICD-10-CM | POA: Insufficient documentation

## 2022-09-30 DIAGNOSIS — M79605 Pain in left leg: Secondary | ICD-10-CM | POA: Insufficient documentation

## 2022-09-30 MED ORDER — KETOROLAC TROMETHAMINE 60 MG/2ML IM SOLN
60.0000 mg | Freq: Once | INTRAMUSCULAR | Status: AC
  Administered 2022-09-30: 60 mg via INTRAMUSCULAR

## 2022-09-30 NOTE — Patient Instructions (Addendum)
Left lower ext both pretibial and calf area pain on palpation. Pain onset after long car trip. We need to get [place] of lower extremity to make sure no DVT. If no dvt then skin infection in differential, muscle pain or radicular pain from back. Note presently I don't think back source of pain. If you note back pain with leg pain let me know.  Prescription treatment get to be determined after [place] review asking to get [place] today. At the very latest tomorrow morning.  For HA give toradol 60 mg IM. Does appear that you have migraine by description. If ha persists despite toradol let me know if worsens or changes then ED evaluation.  Follow up date be determining.

## 2022-09-30 NOTE — Addendum Note (Signed)
Addended by: Scharlene Gloss B on: 09/30/2022 05:01 PM   Modules accepted: Orders

## 2022-09-30 NOTE — Progress Notes (Signed)
   Subjective:    Patient ID: Lorraine Conrad, female    DOB: 1971-02-12, 52 y.o.   MRN: [id number]  HPI Pt in with some left anterior tibial pain that has been present for 3 weeks. Pain started after a trip to mountains 2-3 hours each way.  Pain not going away. First few days felt like a narrow band. Now pain more diffuse and full. Cover of pretibial area. Hurts to walk some . Pain yesterday walking. Mild calf pain in back.   No abnormal shortness of breath.   No obvious Conrad back pain or radicular pain on discuss.    Review of Systems  Constitutional:  Negative for chills, fatigue and fever.  Respiratory:  Negative for cough, chest tightness and shortness of breath.   Cardiovascular:  Negative for chest pain and palpitations.  Gastrointestinal:  Negative for anal bleeding.  Neurological:  Positive for headaches.       Started this afternoon. Some light sensitity but no sound sensitive and bitemporal. Hx of migraine. 1 year since last migraine per. Ha started at 2 pm. 8/10. Second complaint as was sending down for [place].       Objective:   Physical Exam General Mental Status- Alert. General Appearance- Not in acute distress.   Skin General: Color- Normal Color. Moisture- Normal Moisture.  Neck Carotid Arteries- Normal color. Moisture- Normal Moisture. No carotid bruits. No JVD.  Chest and Lung Exam Auscultation: Breath Sounds:-Normal.  Cardiovascular Auscultation:Rythm- Regular. Murmurs & Other Heart Sounds:Auscultation of the heart reveals- No Murmurs.  Abdomen Inspection:-Inspeection Normal. Palpation/Percussion:Note:No mass. Palpation and Percussion of the abdomen reveal- Non Tender, Non Distended + BS, no rebound or guarding.   Neurologic Cranial Nerve exam:- CN III-XII intact(No nystagmus), symmetric smile. Strength:- 5/5 equal and symmetric strength both upper and lower extremities.  Eyes- light sensitive.       Assessment & Plan:  Left lower ext both  pretibial and calf area pain on palpation. Pain onset after long car trip. We need to get [place] of lower extremity to make sure no DVT. If no dvt then skin infection in differential, muscle pain or radicular pain from back. Note presently I don't think back source of pain. If you note back pain with leg pain let me know.  Prescription treatment get to be determined after [place] review asking to get [place] today. At the very latest tomorrow morning.  Follow up date be determining.    Esperanza Richters, PA-C

## 2022-10-02 ENCOUNTER — Encounter: Payer: Self-pay | Admitting: Medical

## 2022-10-03 MED ORDER — CELECOXIB 200 MG PO CAPS: 200.0000 mg | ORAL_CAPSULE | Freq: Every day | ORAL | 0 refills | Status: DC

## 2022-10-03 NOTE — Addendum Note (Signed)
Addended by: Gwenevere Abbot on: 10/03/2022 05:04 PM   Modules accepted: Orders

## 2022-10-14 ENCOUNTER — Other Ambulatory Visit (INDEPENDENT_AMBULATORY_CARE_PROVIDER_SITE_OTHER): Payer: BC Managed Care – PPO

## 2022-10-14 DIAGNOSIS — E559 Vitamin D deficiency, unspecified: Secondary | ICD-10-CM

## 2022-10-14 DIAGNOSIS — E785 Hyperlipidemia, unspecified: Secondary | ICD-10-CM | POA: Diagnosis not present

## 2022-10-14 LAB — COMPREHENSIVE METABOLIC PANEL
ALT: 12 U/L (ref 0–35)
AST: 11 U/L (ref 0–37)
Albumin: 4.1 g/dL (ref 3.5–5.2)
Alkaline Phosphatase: 96 U/L (ref 39–117)
BUN: 14 mg/dL (ref 6–23)
CO2: 29 mEq/L (ref 19–32)
Calcium: 9.1 mg/dL (ref 8.4–10.5)
Chloride: 104 mEq/L (ref 96–112)
Creatinine, Ser: 0.94 mg/dL (ref 0.40–1.20)
GFR: 70.88 mL/min (ref >60.00)
Glucose, Bld: 105 mg/dL — ABNORMAL HIGH (ref 70–99)
Potassium: 4.3 mEq/L (ref 3.5–5.1)
Sodium: 140 mEq/L (ref 135–145)
Total Bilirubin: 0.4 mg/dL (ref 0.2–1.2)
Total Protein: 6.9 g/dL (ref 6.0–8.3)

## 2022-10-14 LAB — LIPID PANEL
Cholesterol: 164 mg/dL (ref 0–200)
HDL: 46.1 mg/dL (ref >39.00)
LDL Cholesterol: 96 mg/dL (ref 0–99)
NonHDL: 118.18
Total CHOL/HDL Ratio: 4
Triglycerides: 110 mg/dL (ref 0.0–149.0)
VLDL: 22 mg/dL (ref 0.0–40.0)

## 2022-10-14 NOTE — Addendum Note (Signed)
Addended by: Rosita Kea on: 10/14/2022 09:57 AM   Modules accepted: Orders

## 2022-10-14 NOTE — Addendum Note (Signed)
Addended by: Rosita Kea on: 10/14/2022 10:00 AM   Modules accepted: Orders

## 2022-10-24 ENCOUNTER — Ambulatory Visit (INDEPENDENT_AMBULATORY_CARE_PROVIDER_SITE_OTHER): Payer: BC Managed Care – PPO

## 2022-10-24 DIAGNOSIS — B351 Tinea unguium: Secondary | ICD-10-CM

## 2022-10-24 NOTE — Progress Notes (Signed)
Patient presents today for the 2nd laser treatment. Diagnosed with mycotic nail infection by Dr. Ralene Cork.   Toenail most affected hallux and 5th bilateral and 3rd right. The hallux left is very loose.  All other systems are negative.  Nails were filed thin. Laser therapy was administered to 1-5 toenails bilateral and patient tolerated the treatment well. All safety precautions were in place.   Single laser pass was done on non-affected nails.  Patient is also using Penlac.   Follow up in 4 weeks for laser # 3.

## 2022-10-27 ENCOUNTER — Ambulatory Visit (INDEPENDENT_AMBULATORY_CARE_PROVIDER_SITE_OTHER): Payer: BC Managed Care – PPO | Admitting: Medical

## 2022-10-27 ENCOUNTER — Ambulatory Visit (HOSPITAL_BASED_OUTPATIENT_CLINIC_OR_DEPARTMENT_OTHER)
Admission: RE | Admit: 2022-10-27 | Discharge: 2022-10-27 | Disposition: A | Discharge status: Home / Self Care | Payer: BC Managed Care – PPO | Source: Ambulatory Visit | Attending: Medical | Admitting: Medical

## 2022-10-27 VITALS — BP 140/76 | HR 65 | Resp 18 | Ht 64.0 in | Wt 227.0 lb

## 2022-10-27 DIAGNOSIS — M25462 Effusion, left knee: Secondary | ICD-10-CM | POA: Diagnosis not present

## 2022-10-27 DIAGNOSIS — M25562 Pain in left knee: Secondary | ICD-10-CM | POA: Insufficient documentation

## 2022-10-27 DIAGNOSIS — M5136 Other intervertebral disc degeneration, lumbar region: Secondary | ICD-10-CM | POA: Diagnosis not present

## 2022-10-27 DIAGNOSIS — M25552 Pain in left hip: Secondary | ICD-10-CM | POA: Diagnosis not present

## 2022-10-27 DIAGNOSIS — M5416 Radiculopathy, lumbar region: Secondary | ICD-10-CM

## 2022-10-27 MED ORDER — METHYLPREDNISOLONE 4 MG PO TABS: ORAL_TABLET | ORAL | 0 refills | Status: DC

## 2022-10-27 NOTE — Patient Instructions (Addendum)
1. Lumbar radiculopathy Minimal degenerative annular disc bulge at L4-5 and L5-S1 without significant stenosis on prior mri. Order mri lumbar again but canceled after discussion/per your request. You want to try PT thru [facility]. Let me know if you need referral. If symptoms worsen let me know can replace mri order.  Rx medrol 6 day taper pending referral to PT. If you find need referral let me know but would need name of clinic. Phone number and fax.  2. Left hip pain Medrol.  - DG HIP UNILAT WITH PELVIS 2-3 VIEWS LEFT; Future  3. Acute pain of left knee medrol - DG Knee Complete 4 Views Left; Future  Follow up date to be determined after PT [facility]ur [facility].

## 2022-10-27 NOTE — Progress Notes (Signed)
Subjective:    Patient ID: Lorraine Conrad, female    DOB: September 11, 1970, 52 y.o.   MRN: [id number]  HPI  Pt has pain in various areas. She states pain in her lower  back may be worse. Some pain that radiates to her though and some toward upper calf.   2016 mri showed below.  IMPRESSION: 1. Minimal degenerative annular disc bulge at L4-5 and L5-S1 without significant stenosis. 2. Mild bilateral facet arthrosis at L4-5. 3. Otherwise normal MRI of the lumbar spine. No significant canal or foraminal stenosis. No neural impingement. 4. Complex cystic left ovarian lesion, incompletely evaluated on this exam. Further evaluation with dedicated pelvic ultrasound could be performed for further assessment as clinically desired.   Pt has some left knee pain and some hip pain.   Recent above area pain flared over pat 2 weeks.  Pt notes gabapentin for nerve pain caused side effects which she states never want to try again.   Pt state PT thru integrative therapies seemed to help in the past.  Pt has no leg weakness. No incontinence. No saddle anesthesia.  Review of Systems  Constitutional:  Negative for chills and fever.  Respiratory:  Negative for cough, chest tightness, shortness of breath and wheezing.   Cardiovascular:  Negative for chest pain and palpitations.  Gastrointestinal:  Negative for abdominal pain and blood in stool.  Musculoskeletal:  Positive for back pain. Negative for joint swelling and neck pain.  Skin:  Negative for rash.  Neurological:  Negative for dizziness and headaches.  Hematological:  Negative for adenopathy. Does not bruise/bleed easily.  Psychiatric/Behavioral:  Negative for behavioral problems and decreased concentration.    Past Medical History:  Diagnosis Date   Allergy    SEASONAL   Anemia    Arthritis    "spine" (03/01/2014)   Asthma    Chronic lower back pain    Endometriosis    Erosive gastritis    External hemorrhoids    GERD  (gastroesophageal reflux disease)    H/O shortness of breath 2014   Cardiopulmonary exercise test results   Headache    "probably monthly" (03/01/2014)   Migraine 1998; 02/2014   "this one's lasted 10 days straight" (03/01/2014)   OSA on CPAP    Ovarian cyst, left    Proctalgia fugax    Sleep apnea    on cpap   Thyroid disease    thyroid nodule     Social History   Socioeconomic History   Marital status: Married    Spouse name: Not on file   Number of children: 2   Years of education: Not on file   Highest education level: Not on file  Occupational History   Occupation: homemaker  Tobacco Use   Smoking status: Never   Smokeless tobacco: Never   Tobacco comments:    father smoked in house growing up per pt.   Vaping Use   Vaping Use: Never used  Substance and Sexual Activity   Alcohol use: Yes    Alcohol/week: 0.0 standard drinks of alcohol    Comment: 12-23-16 "I'll have 1-2 drinks maybe 3-4 times/yr"   Drug use: No   Sexual activity: Yes    Birth control/protection: Surgical  Other Topics Concern   Not on file  Social History Narrative   Daily caffiene use: 6 cups per day   Patient does not get regular excercise   Social Determinants of Health   Financial Resource Strain: Not on file  Food  Insecurity: Not on file  Transportation Needs: Not on file  Physical Activity: Not on file  Stress: Not on file  Social Connections: Not on file  Intimate Partner Violence: Not on file    Past Surgical History:  Procedure Laterality Date   ABDOMINAL HYSTERECTOMY  04/2013   BREAST BIOPSY Bilateral 10/05/2000   BREAST BIOPSY Right 08/26/2019   Fibroadenoma   BREAST EXCISIONAL BIOPSY Left 1991   CESAREAN SECTION  2002; 2006   COLONOSCOPY     LAPAROSCOPIC OVARIAN CYSTECTOMY  ~ 1999   NASAL SEPTUM SURGERY  ~ 1991   OOPHORECTOMY Right 2015   TEMPOROMANDIBULAR JOINT SURGERY  ~ 1992   UPPER GASTROINTESTINAL ENDOSCOPY      Family History  Problem Relation Age of Onset    Prostate cancer Paternal Grandfather    Colon polyps Paternal Grandfather    Colon polyps Father    Heart disease Father    Diverticulitis Mother    Colon polyps Paternal Grandmother    Breast cancer Paternal Grandmother    Colon cancer Neg Hx    Esophageal cancer Neg Hx    Rectal cancer Neg Hx    Stomach cancer Neg Hx    Pancreatic cancer Neg Hx     Allergies  Allergen Reactions   Promethazine Hcl Other (See Comments)    Violent tremors    Cephalexin Diarrhea and Itching   Codeine Other (See Comments)    Was a child when she took this.   Dilaudid [Hydromorphone] Nausea And Vomiting   Soy Allergy Itching   Azithromycin Rash    Head to toe rash    Current Outpatient Medications on File Prior to Visit  Medication Sig Dispense Refill   albuterol (VENTOLIN HFA) 108 (90 Base) MCG/ACT inhaler Inhale 2 puffs into the lungs every 6 (six) hours as needed for wheezing or shortness of breath. 8 g 2   atorvastatin (LIPITOR) 10 MG tablet TAKE 1 TABLET BY MOUTH EVERY DAY 90 tablet 2   Azelastine HCl 137 MCG/SPRAY SOLN PLACE 1 SPRAY INTO BOTH NOSTRILS 2 TIMES DAILY AS DIRECTED 137 mL 2   budesonide-formoterol (SYMBICORT) 160-4.5 MCG/ACT inhaler Inhale 2 puffs into the lungs 2 (two) times daily. 1 each 12   celecoxib (CELEBREX) 200 MG capsule Take 1 capsule (200 mg total) by mouth daily. 20 capsule 0   ciclopirox (PENLAC) 8 % solution Apply topically at bedtime. Apply over nail and surrounding skin. Apply daily over previous coat. After seven (7) days, may remove with alcohol and continue cycle. 6.6 mL 0   CYANOCOBALAMIN IJ Inject as directed. Once a month injection     fluticasone (FLONASE) 50 MCG/ACT nasal spray SPRAY 2 SPRAYS INTO EACH NOSTRIL EVERY DAY 16 mL 5   lansoprazole (PREVACID) 30 MG capsule Take 30 mg by mouth as needed.     loratadine (CLARITIN) 10 MG tablet Take 1 tablet (10 mg total) by mouth daily. 30 tablet 0   methocarbamol (ROBAXIN-750) 750 MG tablet Take 1 tablet (750  mg total) by mouth every 8 (eight) hours as needed for muscle spasms. 30 tablet 0   metroNIDAZOLE (METROGEL) 1 % gel Apply topically daily. 45 g 1   montelukast (SINGULAIR) 10 MG tablet Take 1 tablet (10 mg total) by mouth at bedtime. 30 tablet 3   Vitamin D, Ergocalciferol, (DRISDOL) 1.25 MG (50000 UNIT) CAPS capsule Take 1 capsule (50,000 Units total) by mouth every 7 (seven) days. 8 capsule 0   No current facility-administered medications on file  prior to visit.    BP (!) 140/76   Pulse 65   Resp 18   Ht 5\' 4"  (1.626 m)   Wt 227 lb (103 kg)   LMP 02/08/2013   SpO2 100%   BMI 38.96 kg/m        Objective:   Physical Exam  General Appearance- Not in acute distress.    Chest and Lung Exam Auscultation: Breath sounds:-Normal. Clear even and unlabored. Adventitious sounds:- No Adventitious sounds.  Cardiovascular Auscultation:Rythm - Regular, rate and rythm. Heart Sounds -Normal heart sounds.  Abdomen Inspection:-Inspection Normal.  Palpation/Perucssion: Palpation and Percussion of the abdomen reveal- Non Tender, No Rebound tenderness, No rigidity(Guarding) and No Palpable abdominal masses.  Liver:-Normal.  Spleen:- Normal.   Back Left side si tenderness and faint lower  lumbar spine tenderness to palpation. Pain on straight leg lift. Pain on lateral movements and flexion/extension of the spine.  Lower ext neurologic  L5-S1 sensation intact bilaterally. Normal patellar reflexes bilaterally. No foot drop bilaterally.   Left knee- no swelling but tender to palpation. Mild crepitus. Left hip- normal rom but tender to palpatoin.    Assessment & Plan:   Patient Instructions  1. Lumbar radiculopathy Minimal degenerative annular disc bulge at L4-5 and L5-S1 without significant stenosis on prior mri. Order mri lumbar again but canceled after discussion/per your request. You want to try PT thru integrative health. Let me know if you need referral. If symptoms worsen  let me know can replace mri order.  Rx medrol 6 day taper pending referral to PT. If you find need referral let me know but would need name of clinic. Phone number and fax.  2. Left hip pain Medrol.  - DG HIP UNILAT WITH PELVIS 2-3 VIEWS LEFT; Future  3. Acute pain of left knee medrol - DG Knee Complete 4 Views Left; Future  Follow up date to be determined after PT thur integrative health.     Esperanza Richters, PA-C

## 2022-10-28 ENCOUNTER — Telehealth: Payer: Self-pay | Admitting: Medical

## 2022-10-28 NOTE — Telephone Encounter (Signed)
Pt called stating that she is in a lot of pain today and she was wondering if she could look into getting a shot of Toradol to help with this.

## 2022-10-29 NOTE — Telephone Encounter (Signed)
Pt returned call and was advised that she needs to schedule a NV for toradol injection. Pt will call back this afternoon to get scheduled.

## 2022-10-29 NOTE — Telephone Encounter (Signed)
Pt called and lvm to schedule for NV

## 2022-10-30 ENCOUNTER — Ambulatory Visit (INDEPENDENT_AMBULATORY_CARE_PROVIDER_SITE_OTHER): Payer: BC Managed Care – PPO

## 2022-10-30 DIAGNOSIS — M6281 Muscle weakness (generalized): Secondary | ICD-10-CM | POA: Diagnosis not present

## 2022-10-30 DIAGNOSIS — M5416 Radiculopathy, lumbar region: Secondary | ICD-10-CM | POA: Diagnosis not present

## 2022-10-30 DIAGNOSIS — M25562 Pain in left knee: Secondary | ICD-10-CM | POA: Diagnosis not present

## 2022-10-30 DIAGNOSIS — M5459 Other low back pain: Secondary | ICD-10-CM | POA: Diagnosis not present

## 2022-10-30 MED ORDER — KETOROLAC TROMETHAMINE 30 MG/ML IJ SOLN
30.0000 mg | Freq: Once | INTRAMUSCULAR | Status: AC
Start: 2022-10-30 — End: 2022-10-30
  Administered 2022-10-30: 30 mg via INTRAVENOUS

## 2022-10-30 NOTE — Progress Notes (Signed)
Lorraine Conrad is a 52 y.o. female presents to the office today for toradol injections, per physician's orders. Original order: on phone note from 10/28/22 per PCP: Can you get pt scheduled for nurse visit toradol 30 mg IM.  Ketorolac (med), 30 mg (dose),  IM (route) was administered left upper outer quadrant (location) today. Patient tolerated injection.   [place]

## 2022-10-31 DIAGNOSIS — M5459 Other low back pain: Secondary | ICD-10-CM | POA: Diagnosis not present

## 2022-10-31 DIAGNOSIS — M25562 Pain in left knee: Secondary | ICD-10-CM | POA: Diagnosis not present

## 2022-10-31 DIAGNOSIS — M6281 Muscle weakness (generalized): Secondary | ICD-10-CM | POA: Diagnosis not present

## 2022-11-04 DIAGNOSIS — M6281 Muscle weakness (generalized): Secondary | ICD-10-CM | POA: Diagnosis not present

## 2022-11-04 DIAGNOSIS — M25562 Pain in left knee: Secondary | ICD-10-CM | POA: Diagnosis not present

## 2022-11-04 DIAGNOSIS — M5459 Other low back pain: Secondary | ICD-10-CM | POA: Diagnosis not present

## 2022-11-10 DIAGNOSIS — M25562 Pain in left knee: Secondary | ICD-10-CM | POA: Diagnosis not present

## 2022-11-10 DIAGNOSIS — M6281 Muscle weakness (generalized): Secondary | ICD-10-CM | POA: Diagnosis not present

## 2022-11-10 DIAGNOSIS — M5459 Other low back pain: Secondary | ICD-10-CM | POA: Diagnosis not present

## 2022-11-20 DIAGNOSIS — M5459 Other low back pain: Secondary | ICD-10-CM | POA: Diagnosis not present

## 2022-11-20 DIAGNOSIS — M6281 Muscle weakness (generalized): Secondary | ICD-10-CM | POA: Diagnosis not present

## 2022-11-20 DIAGNOSIS — M25562 Pain in left knee: Secondary | ICD-10-CM | POA: Diagnosis not present

## 2022-11-25 ENCOUNTER — Ambulatory Visit: Payer: BC Managed Care – PPO | Admitting: Podiatry

## 2022-11-28 DIAGNOSIS — M5459 Other low back pain: Secondary | ICD-10-CM | POA: Diagnosis not present

## 2022-11-28 DIAGNOSIS — M6281 Muscle weakness (generalized): Secondary | ICD-10-CM | POA: Diagnosis not present

## 2022-11-28 DIAGNOSIS — M25562 Pain in left knee: Secondary | ICD-10-CM | POA: Diagnosis not present

## 2022-12-02 DIAGNOSIS — M25562 Pain in left knee: Secondary | ICD-10-CM | POA: Diagnosis not present

## 2022-12-02 DIAGNOSIS — M6281 Muscle weakness (generalized): Secondary | ICD-10-CM | POA: Diagnosis not present

## 2022-12-02 DIAGNOSIS — M5459 Other low back pain: Secondary | ICD-10-CM | POA: Diagnosis not present

## 2022-12-05 ENCOUNTER — Ambulatory Visit (INDEPENDENT_AMBULATORY_CARE_PROVIDER_SITE_OTHER): Payer: BC Managed Care – PPO | Admitting: *Deleted

## 2022-12-05 DIAGNOSIS — B351 Tinea unguium: Secondary | ICD-10-CM

## 2022-12-05 NOTE — Progress Notes (Signed)
Patient presents today for the 3rd laser treatment. Diagnosed with mycotic nail infection by Dr. Ralene Cork.   Toenail most affected hallux and 5th bilateral and 3rd right. The hallux left is very loose and I trimmed it back today  All other systems are negative.  Nails were filed thin. Laser therapy was administered to 1-5 toenails bilateral and patient tolerated the treatment well. All safety precautions were in place.   Single laser pass was done on non-affected nails.  Patient is also using Penlac.   Follow up in 6 weeks for laser # 4

## 2022-12-08 ENCOUNTER — Ambulatory Visit (INDEPENDENT_AMBULATORY_CARE_PROVIDER_SITE_OTHER): Payer: BC Managed Care – PPO | Admitting: Podiatry

## 2022-12-08 DIAGNOSIS — Z91199 Patient's noncompliance with other medical treatment and regimen due to unspecified reason: Secondary | ICD-10-CM

## 2022-12-08 NOTE — Progress Notes (Signed)
No show

## 2022-12-22 ENCOUNTER — Encounter: Payer: Self-pay | Admitting: Podiatry

## 2022-12-22 ENCOUNTER — Ambulatory Visit (INDEPENDENT_AMBULATORY_CARE_PROVIDER_SITE_OTHER): Payer: BC Managed Care – PPO | Admitting: Podiatry

## 2022-12-22 DIAGNOSIS — M25562 Pain in left knee: Secondary | ICD-10-CM | POA: Diagnosis not present

## 2022-12-22 DIAGNOSIS — B351 Tinea unguium: Secondary | ICD-10-CM

## 2022-12-22 DIAGNOSIS — M6281 Muscle weakness (generalized): Secondary | ICD-10-CM | POA: Diagnosis not present

## 2022-12-22 DIAGNOSIS — M5459 Other low back pain: Secondary | ICD-10-CM | POA: Diagnosis not present

## 2022-12-22 NOTE — Addendum Note (Signed)
Addended by: Louann Sjogren R on: 12/22/2022 08:33 AM   Modules accepted: Level of Service

## 2022-12-22 NOTE — Progress Notes (Signed)
  Subjective:  Patient ID: Lorraine Conrad, female    DOB: February 05, 1971,   MRN: [id number]  No chief complaint on file.   52 y.o. female presents for follow-up of fungal nail treatments with laser. Has also been using penlac. Relates she maybe notices some improvement but not sure about the fifth toes. She has not been reliably using the penlac. Denies any other pedal complaints. Denies n/v/f/c.   Past Medical History:  Diagnosis Date   Allergy    SEASONAL   Anemia    Arthritis    "spine" (03/01/2014)   Asthma    Chronic lower back pain    Endometriosis    Erosive gastritis    External hemorrhoids    GERD (gastroesophageal reflux disease)    H/O shortness of breath 2014   Cardiopulmonary exercise test results   Headache    "probably monthly" (03/01/2014)   Migraine 1998; 02/2014   "this one's lasted 10 days straight" (03/01/2014)   OSA on CPAP    Ovarian cyst, left    Proctalgia fugax    Sleep apnea    on cpap   Thyroid disease    thyroid nodule    Objective:  Physical Exam: Vascular: DP/PT pulses 2/4 bilateral. CFT <3 seconds. Normal hair growth on digits. No edema.  Skin. No lacerations or abrasions bilateral feet. Bilateral hallux and fifth digit nails discolored and thickened with subungual debris. There is some clearing noted in the proximal half of the hallux nails bilateral noted.  Musculoskeletal: MMT 5/5 bilateral lower extremities in DF, PF, Inversion and Eversion. Deceased ROM in DF of ankle joint.  Neurological: Sensation intact to light touch.   Assessment:   1. Onychomycosis      Plan:  Patient was evaluated and treated and all questions answered. -Examined patient -Discussed treatment options for painful dystrophic nails  -Previous cultures were positive for fungus.  -Discussed fungal nail treatment options including oral, topical, and laser treatments.  Continue with another 3-4 rounds of laser treatments.   -Patient to return in 4 months for recheck.     Louann Sjogren, DPM

## 2022-12-26 ENCOUNTER — Ambulatory Visit (INDEPENDENT_AMBULATORY_CARE_PROVIDER_SITE_OTHER): Payer: BC Managed Care – PPO | Admitting: Podiatry

## 2022-12-26 DIAGNOSIS — B351 Tinea unguium: Secondary | ICD-10-CM

## 2022-12-26 NOTE — Progress Notes (Signed)
Patient presents for laser treatment #4 today.  She gets all 10 nails lasered.  She believes there has been some improvement of the nails.  She noted she had been treated with the other laser prior to getting the PinPointe laser and felt that may have given better results.  Nails were debrided x10 with sterile nail nippers and power debriding burr today.  Two passes of the PinPointe laser were performed to all ten toenails.  Patient tolerated this well.   F/u 6 weeks for laser treatment #5.

## 2023-01-09 DIAGNOSIS — M25562 Pain in left knee: Secondary | ICD-10-CM | POA: Diagnosis not present

## 2023-01-09 DIAGNOSIS — M6281 Muscle weakness (generalized): Secondary | ICD-10-CM | POA: Diagnosis not present

## 2023-01-09 DIAGNOSIS — M5459 Other low back pain: Secondary | ICD-10-CM | POA: Diagnosis not present

## 2023-01-23 ENCOUNTER — Other Ambulatory Visit: Payer: BC Managed Care – PPO

## 2023-02-02 DIAGNOSIS — H01119 Allergic dermatitis of unspecified eye, unspecified eyelid: Secondary | ICD-10-CM | POA: Diagnosis not present

## 2023-02-06 ENCOUNTER — Ambulatory Visit (INDEPENDENT_AMBULATORY_CARE_PROVIDER_SITE_OTHER): Payer: BC Managed Care – PPO | Admitting: *Deleted

## 2023-02-06 DIAGNOSIS — B351 Tinea unguium: Secondary | ICD-10-CM

## 2023-02-06 NOTE — Progress Notes (Signed)
Patient presents today for the 5th laser treatment. Diagnosed with mycotic nail infection by Dr. Ralene Cork.   Toenail most affected hallux and 5th bilateral and 3rd right.   All other systems are negative.  Nails were filed thin. Laser therapy was administered to 1-5 toenails bilateral and patient tolerated the treatment well. All safety precautions were in place.   Single laser pass was done on non-affected nails.  Patient is also using Penlac.   Follow up in 8 weeks for laser # 6

## 2023-02-07 ENCOUNTER — Other Ambulatory Visit: Payer: Self-pay | Admitting: Medical

## 2023-02-20 DIAGNOSIS — M5459 Other low back pain: Secondary | ICD-10-CM | POA: Diagnosis not present

## 2023-02-20 DIAGNOSIS — M6281 Muscle weakness (generalized): Secondary | ICD-10-CM | POA: Diagnosis not present

## 2023-02-20 DIAGNOSIS — M25562 Pain in left knee: Secondary | ICD-10-CM | POA: Diagnosis not present

## 2023-02-23 DIAGNOSIS — M25562 Pain in left knee: Secondary | ICD-10-CM | POA: Diagnosis not present

## 2023-02-23 DIAGNOSIS — M5459 Other low back pain: Secondary | ICD-10-CM | POA: Diagnosis not present

## 2023-02-23 DIAGNOSIS — M6281 Muscle weakness (generalized): Secondary | ICD-10-CM | POA: Diagnosis not present

## 2023-02-27 ENCOUNTER — Ambulatory Visit (INDEPENDENT_AMBULATORY_CARE_PROVIDER_SITE_OTHER): Payer: BC Managed Care – PPO | Admitting: Podiatrist

## 2023-02-27 DIAGNOSIS — B351 Tinea unguium: Secondary | ICD-10-CM

## 2023-02-27 NOTE — Progress Notes (Unsigned)
Patient presents today for the 6th laser treatment. Diagnosed with mycotic nail infection by Dr. Ralene Cork.   Toenail most affected bilateral 1st and 5th toenails, right 3rd toenail.  All other systems are negative.  Nails were filed thin. Laser therapy was administered to bilateral toenails 1-5 and patient tolerated the treatment well. All safety precautions were in place.   Single laser pass was done on non-affected nails.  Patient reported continued use of penlac   Patient has completed course of laser therapy. Patient was advised to schedule follow-up appointment as needed with Dr. Ralene Cork to determine if any further medicinal nail treatment is needed.

## 2023-02-27 NOTE — Patient Instructions (Signed)

## 2023-03-02 DIAGNOSIS — M5459 Other low back pain: Secondary | ICD-10-CM | POA: Diagnosis not present

## 2023-03-02 DIAGNOSIS — M6281 Muscle weakness (generalized): Secondary | ICD-10-CM | POA: Diagnosis not present

## 2023-03-02 DIAGNOSIS — M25562 Pain in left knee: Secondary | ICD-10-CM | POA: Diagnosis not present

## 2023-03-09 DIAGNOSIS — M6281 Muscle weakness (generalized): Secondary | ICD-10-CM | POA: Diagnosis not present

## 2023-03-09 DIAGNOSIS — M25562 Pain in left knee: Secondary | ICD-10-CM | POA: Diagnosis not present

## 2023-03-09 DIAGNOSIS — M5459 Other low back pain: Secondary | ICD-10-CM | POA: Diagnosis not present

## 2023-03-13 ENCOUNTER — Other Ambulatory Visit: Payer: BC Managed Care – PPO

## 2023-03-16 ENCOUNTER — Ambulatory Visit (INDEPENDENT_AMBULATORY_CARE_PROVIDER_SITE_OTHER): Payer: BC Managed Care – PPO | Admitting: Podiatry

## 2023-03-16 DIAGNOSIS — M5459 Other low back pain: Secondary | ICD-10-CM | POA: Diagnosis not present

## 2023-03-16 DIAGNOSIS — B351 Tinea unguium: Secondary | ICD-10-CM

## 2023-03-16 DIAGNOSIS — M6281 Muscle weakness (generalized): Secondary | ICD-10-CM | POA: Diagnosis not present

## 2023-03-16 DIAGNOSIS — M25562 Pain in left knee: Secondary | ICD-10-CM | POA: Diagnosis not present

## 2023-03-16 NOTE — Progress Notes (Signed)
  Subjective:  Patient ID: Lorraine Conrad, female    DOB: 12-12-1970,   MRN: [id number]  Chief Complaint  Patient presents with   Nail Problem    FINISHED 6TH LASER, THINKS THEY LOOK A LITTLE BETTER    52 y.o. female presents for follow-up of fungal nail treatments with laser. Relates they are a bit better but has still been seeing some spread and concerned for contamination.  Denies any other pedal complaints. Denies n/v/f/c.   Past Medical History:  Diagnosis Date   Allergy    SEASONAL   Anemia    Arthritis    "spine" (03/01/2014)   Asthma    Chronic lower back pain    Endometriosis    Erosive gastritis    External hemorrhoids    GERD (gastroesophageal reflux disease)    H/O shortness of breath 2014   Cardiopulmonary exercise test results   Headache    "probably monthly" (03/01/2014)   Migraine 1998; 02/2014   "this one's lasted 10 days straight" (03/01/2014)   OSA on CPAP    Ovarian cyst, left    Proctalgia fugax    Sleep apnea    on cpap   Thyroid disease    thyroid nodule    Objective:  Physical Exam: Vascular: DP/PT pulses 2/4 bilateral. CFT <3 seconds. Normal hair growth on digits. No edema.  Skin. No lacerations or abrasions bilateral feet. Bilateral hallux and fifth digit nails discolored and thickened with subungual debris. There is some clearing noted in the proximal 2/3rds of the hallux nails bilateral noted.  Musculoskeletal: MMT 5/5 bilateral lower extremities in DF, PF, Inversion and Eversion. Deceased ROM in DF of ankle joint.  Neurological: Sensation intact to light touch.   Assessment:   1. Onychomycosis       Plan:  Patient was evaluated and treated and all questions answered. -Examined patient -Discussed treatment options for painful dystrophic nails  -Previous cultures were positive for fungus.  -Discussed fungal nail treatment options including oral, topical, and laser treatments.  Continue with another 2-3 rounds of laser treatments.   Did  discuss trying lamisil but patient hesitant.  -Patient to return in 3 months for recheck.    Louann Sjogren, DPM

## 2023-03-23 DIAGNOSIS — M6281 Muscle weakness (generalized): Secondary | ICD-10-CM | POA: Diagnosis not present

## 2023-03-23 DIAGNOSIS — M25562 Pain in left knee: Secondary | ICD-10-CM | POA: Diagnosis not present

## 2023-03-23 DIAGNOSIS — M5459 Other low back pain: Secondary | ICD-10-CM | POA: Diagnosis not present

## 2023-03-24 DIAGNOSIS — M25562 Pain in left knee: Secondary | ICD-10-CM | POA: Diagnosis not present

## 2023-03-24 DIAGNOSIS — M2550 Pain in unspecified joint: Secondary | ICD-10-CM | POA: Diagnosis not present

## 2023-03-25 ENCOUNTER — Telehealth: Payer: Self-pay | Admitting: Gastroenterology

## 2023-03-25 NOTE — Telephone Encounter (Signed)
Good afternoon Dr. Leonides Schanz  The following patient is currently under Dr. Ardell Isaacs care and wishes to trasfer to you. Are you willing to take her on a patient? Please advise.

## 2023-03-26 DIAGNOSIS — Z1231 Encounter for screening mammogram for malignant neoplasm of breast: Secondary | ICD-10-CM | POA: Diagnosis not present

## 2023-03-26 LAB — HM MAMMOGRAPHY

## 2023-03-27 ENCOUNTER — Encounter: Payer: Self-pay | Admitting: Medical

## 2023-03-30 DIAGNOSIS — M25562 Pain in left knee: Secondary | ICD-10-CM | POA: Diagnosis not present

## 2023-04-01 DIAGNOSIS — M5459 Other low back pain: Secondary | ICD-10-CM | POA: Diagnosis not present

## 2023-04-01 DIAGNOSIS — M6281 Muscle weakness (generalized): Secondary | ICD-10-CM | POA: Diagnosis not present

## 2023-04-01 DIAGNOSIS — M25562 Pain in left knee: Secondary | ICD-10-CM | POA: Diagnosis not present

## 2023-04-06 DIAGNOSIS — M5459 Other low back pain: Secondary | ICD-10-CM | POA: Diagnosis not present

## 2023-04-06 DIAGNOSIS — M6281 Muscle weakness (generalized): Secondary | ICD-10-CM | POA: Diagnosis not present

## 2023-04-06 DIAGNOSIS — M25562 Pain in left knee: Secondary | ICD-10-CM | POA: Diagnosis not present

## 2023-04-06 DIAGNOSIS — M25511 Pain in right shoulder: Secondary | ICD-10-CM | POA: Diagnosis not present

## 2023-04-09 ENCOUNTER — Encounter: Payer: Self-pay | Admitting: Internal Medicine

## 2023-04-13 DIAGNOSIS — M25562 Pain in left knee: Secondary | ICD-10-CM | POA: Diagnosis not present

## 2023-04-14 DIAGNOSIS — M25562 Pain in left knee: Secondary | ICD-10-CM | POA: Diagnosis not present

## 2023-04-15 DIAGNOSIS — M6281 Muscle weakness (generalized): Secondary | ICD-10-CM | POA: Diagnosis not present

## 2023-04-15 DIAGNOSIS — M5459 Other low back pain: Secondary | ICD-10-CM | POA: Diagnosis not present

## 2023-04-15 DIAGNOSIS — M25511 Pain in right shoulder: Secondary | ICD-10-CM | POA: Diagnosis not present

## 2023-04-15 DIAGNOSIS — M25562 Pain in left knee: Secondary | ICD-10-CM | POA: Diagnosis not present

## 2023-04-17 ENCOUNTER — Ambulatory Visit: Payer: BC Managed Care – PPO | Admitting: Podiatrist

## 2023-04-17 DIAGNOSIS — B351 Tinea unguium: Secondary | ICD-10-CM

## 2023-04-17 NOTE — Progress Notes (Signed)
Patient presents today for the 7th laser treatment. Diagnosed with mycotic nail infection by Dr. Ralene Cork.   At her last visit with Dr. Ralene Cork it was recommended she complete 3 more laser treatments and then come back to see Dr. Ralene Cork.    Toenail most affected bilateral 1st and 5th toenails, right 3rd toenail.  All other systems are negative.  Nails were filed thin. Laser therapy was administered to bilateral toenails 1-5 and patient tolerated the treatment well. All safety precautions were in place.   Single laser pass was done on non-affected nails.  Patient reported continued use of penlac  She will return in 6-8 weeks .

## 2023-04-20 DIAGNOSIS — M79642 Pain in left hand: Secondary | ICD-10-CM | POA: Diagnosis not present

## 2023-04-20 DIAGNOSIS — M25511 Pain in right shoulder: Secondary | ICD-10-CM | POA: Diagnosis not present

## 2023-04-20 DIAGNOSIS — M79641 Pain in right hand: Secondary | ICD-10-CM | POA: Diagnosis not present

## 2023-04-20 DIAGNOSIS — M5459 Other low back pain: Secondary | ICD-10-CM | POA: Diagnosis not present

## 2023-04-20 DIAGNOSIS — R768 Other specified abnormal immunological findings in serum: Secondary | ICD-10-CM | POA: Diagnosis not present

## 2023-04-20 DIAGNOSIS — M1991 Primary osteoarthritis, unspecified site: Secondary | ICD-10-CM | POA: Diagnosis not present

## 2023-04-20 DIAGNOSIS — M25562 Pain in left knee: Secondary | ICD-10-CM | POA: Diagnosis not present

## 2023-04-20 DIAGNOSIS — M6281 Muscle weakness (generalized): Secondary | ICD-10-CM | POA: Diagnosis not present

## 2023-04-20 DIAGNOSIS — K9041 Non-celiac gluten sensitivity: Secondary | ICD-10-CM | POA: Diagnosis not present

## 2023-04-27 ENCOUNTER — Ambulatory Visit: Payer: BC Managed Care – PPO | Admitting: Podiatry

## 2023-04-27 DIAGNOSIS — M6281 Muscle weakness (generalized): Secondary | ICD-10-CM | POA: Diagnosis not present

## 2023-04-27 DIAGNOSIS — M5459 Other low back pain: Secondary | ICD-10-CM | POA: Diagnosis not present

## 2023-04-27 DIAGNOSIS — M25511 Pain in right shoulder: Secondary | ICD-10-CM | POA: Diagnosis not present

## 2023-04-27 DIAGNOSIS — M25562 Pain in left knee: Secondary | ICD-10-CM | POA: Diagnosis not present

## 2023-05-11 DIAGNOSIS — M5459 Other low back pain: Secondary | ICD-10-CM | POA: Diagnosis not present

## 2023-05-11 DIAGNOSIS — M25562 Pain in left knee: Secondary | ICD-10-CM | POA: Diagnosis not present

## 2023-05-11 DIAGNOSIS — M6281 Muscle weakness (generalized): Secondary | ICD-10-CM | POA: Diagnosis not present

## 2023-05-11 DIAGNOSIS — M25511 Pain in right shoulder: Secondary | ICD-10-CM | POA: Diagnosis not present

## 2023-05-13 DIAGNOSIS — M79641 Pain in right hand: Secondary | ICD-10-CM | POA: Diagnosis not present

## 2023-05-13 DIAGNOSIS — M79642 Pain in left hand: Secondary | ICD-10-CM | POA: Diagnosis not present

## 2023-05-13 DIAGNOSIS — R768 Other specified abnormal immunological findings in serum: Secondary | ICD-10-CM | POA: Diagnosis not present

## 2023-05-13 DIAGNOSIS — M1991 Primary osteoarthritis, unspecified site: Secondary | ICD-10-CM | POA: Diagnosis not present

## 2023-05-15 ENCOUNTER — Ambulatory Visit: Payer: BC Managed Care – PPO | Admitting: Adult Health

## 2023-05-15 ENCOUNTER — Encounter: Payer: Self-pay | Admitting: Adult Health

## 2023-05-15 VITALS — BP 120/74 | HR 105 | Temp 98.2°F | Ht 64.0 in | Wt 226.4 lb

## 2023-05-15 DIAGNOSIS — J4530 Mild persistent asthma, uncomplicated: Secondary | ICD-10-CM

## 2023-05-15 DIAGNOSIS — G4733 Obstructive sleep apnea (adult) (pediatric): Secondary | ICD-10-CM | POA: Diagnosis not present

## 2023-05-15 DIAGNOSIS — J45909 Unspecified asthma, uncomplicated: Secondary | ICD-10-CM | POA: Insufficient documentation

## 2023-05-15 NOTE — Progress Notes (Signed)
@Patient  ID: Lorraine Conrad, female    DOB: 12-Sep-1970, 52 y.o.   MRN: [id number]  Chief Complaint  Patient presents with   Follow-up    Referring provider: Marisue Brooklyn  HPI: 52 year old female never smoker followed for obstructive sleep apnea and Asthma    TEST/EVENTS :  Pulmonary testing: Methacholine challenge 12/31/15 >> negative   Sleep tests:  PSG 02/04/18 >> AHI 27.9, SpO2 Conrad 83% HST 08/05/21 >> AHI 52.2, SpO2 Conrad 74% Overnight oximetry test on CPAP September 26, 2021 showed no significant desaturations.  O2 saturations remained above 90% during the test with only 8 seconds below 88%.     Cardiac testing: Echo 09/27/15 >> EF 55 to 60%, grade 1 DD, mild MR CPST 01/11/16 >> deconditioning  05/15/2023 Follow up : OSA and Asthma /AR  Discussed the use of AI scribe software for clinical note transcription with the patient, who gave verbal consent to proceed.  History of Present Illness   The patient, with a history of sleep apnea and asthma, reports overall good compliance with their CPAP machine, noting no major issues.  Feels that she benefits from CPAP with decreased daytime sleepiness.  Wears her CPAP each night.  CPAP download shows excellent compliance with daily average usage at 6 hours.  Patient is on auto CPAP 5 to 12 cm H2O.  Uses a full facemask.  AHI 0.8/hour.   Patient has mild asthma.  Says overall asthma is doing okay.  She does get some intermittent tightness.  Uses her Symbicort on and off.  Remains on Singulair and Xyzal daily.  She says lately with the cold air she has noticed a little bit more tightness.  We discussed using her Symbicort on a more regular basis. Declines flu shot.         Allergies  Allergen Reactions   Promethazine Hcl Other (See Comments)    Violent tremors    Cephalexin Diarrhea and Itching   Codeine Other (See Comments)    Was a child when she took this.   Dilaudid [Hydromorphone] Nausea And Vomiting   Soy Allergy (Do  Not Select) Itching   Azithromycin Rash    Head to toe rash    Immunization History  Administered Date(s) Administered   Influenza-Unspecified 08/04/2015   PFIZER(Purple Top)SARS-COV-2 Vaccination 08/14/2019, 09/14/2019, 06/04/2020   Td 12/11/2014   Tdap 01/16/2014    Past Medical History:  Diagnosis Date   Allergy    SEASONAL   Anemia    Arthritis    "spine" (03/01/2014)   Asthma    Chronic lower back pain    Endometriosis    Erosive gastritis    External hemorrhoids    GERD (gastroesophageal reflux disease)    H/O shortness of breath 2014   Cardiopulmonary exercise test results   Headache    "probably monthly" (03/01/2014)   Migraine 1998; 02/2014   "this one's lasted 10 days straight" (03/01/2014)   OSA on CPAP    Ovarian cyst, left    Proctalgia fugax    Sleep apnea    on cpap   Thyroid disease    thyroid nodule    Tobacco History: Social History   Tobacco Use  Smoking Status Never  Smokeless Tobacco Never  Tobacco Comments   father smoked in house growing up per pt.    Counseling given: Not Answered Tobacco comments: father smoked in house growing up per pt.    Outpatient Medications Prior to Visit  Medication Sig Dispense Refill  albuterol (VENTOLIN HFA) 108 (90 Base) MCG/ACT inhaler Inhale 2 puffs into the lungs every 6 (six) hours as needed for wheezing or shortness of breath. 8 g 2   atorvastatin (LIPITOR) 10 MG tablet TAKE 1 TABLET BY MOUTH EVERY DAY 90 tablet 2   Azelastine HCl 137 MCG/SPRAY SOLN PLACE 1 SPRAY INTO BOTH NOSTRILS 2 TIMES DAILY AS DIRECTED 30 mL 2   budesonide-formoterol (SYMBICORT) 160-4.5 MCG/ACT inhaler Inhale 2 puffs into the lungs 2 (two) times daily. 1 each 12   celecoxib (CELEBREX) 200 MG capsule Take 1 capsule (200 mg total) by mouth daily. 20 capsule 0   ciclopirox (PENLAC) 8 % solution Apply topically at bedtime. Apply over nail and surrounding skin. Apply daily over previous coat. After seven (7) days, may remove with  alcohol and continue cycle. 6.6 mL 0   fluticasone (FLONASE) 50 MCG/ACT nasal spray SPRAY 2 SPRAYS INTO EACH NOSTRIL EVERY DAY 16 mL 5   lansoprazole (PREVACID) 30 MG capsule Take 30 mg by mouth as needed.     loratadine (CLARITIN) 10 MG tablet Take 1 tablet (10 mg total) by mouth daily. 30 tablet 0   methocarbamol (ROBAXIN-750) 750 MG tablet Take 1 tablet (750 mg total) by mouth every 8 (eight) hours as needed for muscle spasms. 30 tablet 0   metroNIDAZOLE (METROGEL) 1 % gel Apply topically daily. 45 g 1   montelukast (SINGULAIR) 10 MG tablet Take 1 tablet (10 mg total) by mouth at bedtime. 30 tablet 3   vitamin B-12 (CYANOCOBALAMIN) 100 MCG tablet Take 100 mcg by mouth daily.     Vitamin D, Ergocalciferol, (DRISDOL) 1.25 MG (50000 UNIT) CAPS capsule Take 1 capsule (50,000 Units total) by mouth every 7 (seven) days. 8 capsule 0   CYANOCOBALAMIN IJ Inject as directed. Once a month injection (Patient not taking: Reported on 05/15/2023)     methylPREDNISolone (MEDROL) 4 MG tablet Standard 6 day taper dose back. (Patient not taking: Reported on 05/15/2023) 21 tablet 0   No facility-administered medications prior to visit.     Review of Systems:   Constitutional:   No  weight loss, night sweats,  Fevers, chills, fatigue, or  lassitude.  HEENT:   No headaches,  Difficulty swallowing,  Tooth/dental problems, or  Sore throat,                No sneezing, itching, ear ache,  +nasal congestion, post nasal drip,   CV:  No chest pain,  Orthopnea, PND, swelling in lower extremities, anasarca, dizziness, palpitations, syncope.   GI  No heartburn, indigestion, abdominal pain, nausea, vomiting, diarrhea, change in bowel habits, loss of appetite, bloody stools.   Resp:   No chest wall deformity  Skin: no rash or lesions.  GU: no dysuria, change in color of urine, no urgency or frequency.  No flank pain, no hematuria   MS:  No joint pain or swelling.  No decreased range of motion.  No back  pain.    Physical Exam  BP 120/74 (BP Location: Left Arm, Patient Position: Sitting, Cuff Size: Large)   Pulse (!) 105   Temp 98.2 F (36.8 C) (Oral)   Ht 5\' 4"  (1.626 m)   Wt 226 lb 6.4 oz (102.7 kg)   LMP 02/08/2013   SpO2 98%   BMI 38.86 kg/m   GEN: A/Ox3; pleasant , NAD, well nourished    HEENT:  [place]/AT,  EACs-clear, TMs-wnl, NOSE-clear, THROAT-clear, no lesions, no postnasal drip or exudate noted.   NECK:  Supple w/ fair ROM; no JVD; normal carotid impulses w/o bruits; no thyromegaly or nodules palpated; no lymphadenopathy.    RESP faint expiratory wheeze on forced expiration no accessory muscle use, no dullness to percussion  CARD:  RRR, no m/r/g, no peripheral edema, pulses intact, no cyanosis or clubbing.  GI:   Soft & nt; nml bowel sounds; no organomegaly or masses detected.   Musco: Warm bil, no deformities or joint swelling noted.   Neuro: alert, no focal deficits noted.    Skin: Warm, no lesions or rashes    Lab Results:  CBC   BNP   ProBNP No results found for: "PROBNP"  Imaging: No results found.  Administration History     None          Latest Ref Rng & Units 12/31/2015    1:01 PM  PFT Results  FVC-Pre L 2.98   FVC-Predicted Pre % 81   FVC-Post L 2.84   FVC-Predicted Post % 77   Pre FEV1/FVC % % 88   Post FEV1/FCV % % 89   FEV1-Pre L 2.62   FEV1-Predicted Pre % 88   FEV1-Post L 2.52     Lab Results  Component Value Date   NITRICOXIDE 16 12/25/2015        Assessment & Plan:  Assessment and Plan    Obstructive Sleep Apnea Good compliance and control on CPAP.  She has perceived benefit. Discussed the importance of nightly use to prevent apnea episodes. We will continue CPAP therapy nightly,  and ensure regular cleaning of the mask and tubing.  Asthma-mild persistent asthma. Currently using Symbicort only when symptomatic.  Mild symptoms currently.  Discussed using Symbicort regular basis especially if she is having  daily symptoms.  Encouraged her to use Symbicort 1 puff daily.  We discussed asthma action plan to continue on trigger prevention.  Continue on Singulair and Xyzal daily.   General Health Maintenance Declines flu shot   Plan  Patient Instructions  Wear CPAP At bedtime  -all night  Work on healthy sleep regimen  Do not drive if sleepy   Work on healthy weight loss.  Activity as tolerated.  Continue on Singulair and Xyzal daily  Use Symbicort 1 puff daily, rinse after use .  Albuterol inhaler as needed  Asthma action plan as discussed.  Follow up with Dr. Wynona Neat or Ganesh Deeg NP in 1 year with Spirometry with DLCO .             Rubye Oaks, NP 05/15/2023

## 2023-05-15 NOTE — Patient Instructions (Addendum)
Wear CPAP At bedtime  -all night  Work on healthy sleep regimen  Do not drive if sleepy   Work on healthy weight loss.  Activity as tolerated.  Continue on Singulair and Xyzal daily  Use Symbicort 1 puff daily, rinse after use .  Albuterol inhaler as needed  Asthma action plan as discussed.  Follow up with Dr. Wynona Neat or Tyr Franca NP in 1 year with Spirometry with DLCO .

## 2023-05-18 DIAGNOSIS — M25562 Pain in left knee: Secondary | ICD-10-CM | POA: Diagnosis not present

## 2023-05-18 DIAGNOSIS — M5459 Other low back pain: Secondary | ICD-10-CM | POA: Diagnosis not present

## 2023-05-18 DIAGNOSIS — M25511 Pain in right shoulder: Secondary | ICD-10-CM | POA: Diagnosis not present

## 2023-05-18 DIAGNOSIS — M6281 Muscle weakness (generalized): Secondary | ICD-10-CM | POA: Diagnosis not present

## 2023-05-21 ENCOUNTER — Other Ambulatory Visit: Payer: Self-pay

## 2023-05-21 ENCOUNTER — Emergency Department (HOSPITAL_COMMUNITY): Payer: BC Managed Care – PPO

## 2023-05-21 ENCOUNTER — Emergency Department (HOSPITAL_COMMUNITY)
Admission: EM | Admit: 2023-05-21 | Discharge: 2023-05-21 | Disposition: A | Discharge status: Home / Self Care | Payer: BC Managed Care – PPO | Attending: Emergency Medicine | Admitting: Emergency Medicine

## 2023-05-21 DIAGNOSIS — R110 Nausea: Secondary | ICD-10-CM | POA: Diagnosis not present

## 2023-05-21 DIAGNOSIS — M546 Pain in thoracic spine: Secondary | ICD-10-CM | POA: Insufficient documentation

## 2023-05-21 DIAGNOSIS — R079 Chest pain, unspecified: Secondary | ICD-10-CM | POA: Diagnosis not present

## 2023-05-21 DIAGNOSIS — Z79899 Other long term (current) drug therapy: Secondary | ICD-10-CM | POA: Insufficient documentation

## 2023-05-21 DIAGNOSIS — J45909 Unspecified asthma, uncomplicated: Secondary | ICD-10-CM | POA: Insufficient documentation

## 2023-05-21 DIAGNOSIS — Z7951 Long term (current) use of inhaled steroids: Secondary | ICD-10-CM | POA: Insufficient documentation

## 2023-05-21 DIAGNOSIS — M25512 Pain in left shoulder: Secondary | ICD-10-CM | POA: Diagnosis not present

## 2023-05-21 DIAGNOSIS — R0789 Other chest pain: Secondary | ICD-10-CM | POA: Diagnosis not present

## 2023-05-21 LAB — CBC
HCT: 40.8 % (ref 36.0–46.0)
Hemoglobin: 13.2 g/dL (ref 12.0–15.0)
MCH: 28 pg (ref 26.0–34.0)
MCHC: 32.4 g/dL (ref 30.0–36.0)
MCV: 86.4 fL (ref 80.0–100.0)
Platelets: 424 10*3/uL — ABNORMAL HIGH (ref 150–400)
RBC: 4.72 MIL/uL (ref 3.87–5.11)
RDW: 13.4 % (ref 11.5–15.5)
WBC: 9.1 10*3/uL (ref 4.0–10.5)
nRBC: 0 % (ref 0.0–0.2)

## 2023-05-21 LAB — BASIC METABOLIC PANEL
Anion gap: 9 (ref 5–15)
BUN: 9 mg/dL (ref 6–20)
CO2: 23 mmol/L (ref 22–32)
Calcium: 9.5 mg/dL (ref 8.9–10.3)
Chloride: 105 mmol/L (ref 98–111)
Creatinine, Ser: 0.92 mg/dL (ref 0.44–1.00)
GFR, Estimated: >60 mL/min (ref >60)
Glucose, Bld: 101 mg/dL — ABNORMAL HIGH (ref 70–99)
Potassium: 3.9 mmol/L (ref 3.5–5.1)
Sodium: 137 mmol/L (ref 135–145)

## 2023-05-21 LAB — TROPONIN I (HIGH SENSITIVITY)
Troponin I (High Sensitivity): 4 ng/L (ref <18)
Troponin I (High Sensitivity): 3 ng/L (ref <18)

## 2023-05-21 MED ORDER — ALBUTEROL SULFATE HFA 108 (90 BASE) MCG/ACT IN AERS
2.0000 | INHALATION_SPRAY | Freq: Once | RESPIRATORY_TRACT | Status: AC
  Administered 2023-05-21: 2 via RESPIRATORY_TRACT
  Filled 2023-05-21: qty 6.7

## 2023-05-21 NOTE — ED Provider Notes (Signed)
[place] EMERGENCY DEPARTMENT AT [facility] Provider Note   CSN: [id number] Arrival date & time: 05/21/23  0115     History  Chief Complaint  Patient presents with   Chest Pain    Lorraine Conrad is a 52 y.o. female with a PMH of asthma, arthritis who presented to the ED for chest pain.  Patient reports around 10 PM last night, she started having chest pain on the left side of her chest with radiation into her left shoulder and then back.  States has been intermittent and does not describe a pattern.  Denies exertional component, diaphoresis, or vomiting.  States when she walked up the stairs last night, she felt short of breath, but otherwise denies shortness of breath.   She reports that she had her husband massage her left shoulder, which seemed to help her pain.  Also reports that she had had left chest wall tenderness.  States she has had similar shoulder pain in the past, but denies having the chest pain.  Denies fever, cough, congestion, or sick contacts. Denies abdominal pain, diarrhea.    Chest Pain      Home Medications Prior to Admission medications   Medication Sig Start Date End Date Taking? Authorizing Provider  acetaminophen (TYLENOL) 500 MG tablet Take 500-1,000 mg by mouth every 6 (six) hours as needed for moderate pain (pain score 4-6).   Yes [provider]  albuterol (VENTOLIN HFA) 108 (90 Base) MCG/ACT inhaler Inhale 2 puffs into the lungs every 6 (six) hours as needed for wheezing or shortness of breath. 07/10/21  Yes Worley, Lelon Mast, PA  atorvastatin (LIPITOR) 10 MG tablet TAKE 1 TABLET BY MOUTH EVERY DAY 09/11/22  Yes Saguier, Ramon Dredge, PA-C  Azelastine HCl 137 MCG/SPRAY SOLN PLACE 1 SPRAY INTO BOTH NOSTRILS 2 TIMES DAILY AS DIRECTED Patient taking differently: Place 1 spray into both nostrils 2 (two) times daily as needed (Rhinitis). 02/10/23  Yes Saguier, Ramon Dredge, PA-C  celecoxib (CELEBREX) 200 MG capsule Take 1 capsule (200 mg total) by mouth  daily. Patient taking differently: Take 200 mg by mouth 2 (two) times daily as needed for moderate pain (pain score 4-6). 10/03/22  Yes Saguier, Ramon Dredge, PA-C  ciclopirox (PENLAC) 8 % solution Apply topically at bedtime. Apply over nail and surrounding skin. Apply daily over previous coat. After seven (7) days, may remove with alcohol and continue cycle. Patient taking differently: Apply 1 Application topically at bedtime as needed. Apply over nail and surrounding skin. Apply daily over previous coat. After seven (7) days, may remove with alcohol and continue cycle. 08/25/22  Yes Louann Sjogren, DPM  fluticasone (FLONASE) 50 MCG/ACT nasal spray SPRAY 2 SPRAYS INTO EACH NOSTRIL EVERY DAY Patient taking differently: Place 2 sprays into both nostrils daily as needed for allergies or rhinitis. 03/14/21  Yes Saguier, Ramon Dredge, PA-C  lansoprazole (PREVACID) 30 MG capsule Take 30 mg by mouth daily as needed (Indigestion).   Yes [provider]  levocetirizine (XYZAL) 5 MG tablet Take 5 mg by mouth daily as needed for allergies.   Yes [provider]  terbinafine (LAMISIL) 250 MG tablet Take 250 mg by mouth daily as needed (Fungus). 12/08/22  Yes [provider]  vitamin B-12 (CYANOCOBALAMIN) 100 MCG tablet Take 100 mcg by mouth daily.   Yes [provider]  budesonide-formoterol (SYMBICORT) 160-4.5 MCG/ACT inhaler Inhale 2 puffs into the lungs 2 (two) times daily. Patient not taking: Reported on 05/21/2023 10/04/21   Saguier, Ramon Dredge, PA-C  predniSONE [facility]  UNI-PAK 21 TAB) 10 MG (21) TBPK tablet Take by mouth. Patient not taking: Reported on 05/21/2023 05/15/23   [provider]      Allergies    Promethazine hcl, Cephalexin, Codeine, Dilaudid [hydromorphone], Soy allergy (do not select), and Azithromycin    Review of Systems   Review of Systems  Cardiovascular:  Positive for chest pain.    Physical Exam Updated Vital Signs BP 121/71   Pulse 80   Temp 97.9  F (36.6 C)   Resp (!) 23   LMP 02/08/2013   SpO2 98%  Physical Exam Constitutional:      General: She is not in acute distress.    Appearance: She is well-developed. She is not ill-appearing.  HENT:     Head: Normocephalic and atraumatic.  Eyes:     Pupils: Pupils are equal, round, and reactive to light.  Cardiovascular:     Rate and Rhythm: Normal rate and regular rhythm.     Heart sounds: No murmur heard.    No friction rub. No gallop.  Pulmonary:     Effort: Pulmonary effort is normal.     Breath sounds: Normal breath sounds. No wheezing, rhonchi or rales.  Chest:     Chest wall: Tenderness present.     Comments: Left sternal border tenderness Abdominal:     Tenderness: There is no abdominal tenderness. There is no guarding or rebound.  Musculoskeletal:     Cervical back: Normal range of motion and neck supple.     Right lower leg: No edema.     Left lower leg: No edema.     Comments: Left trapezius, bilateral thoracic paraspinal muscle tenderness to palpation.  No midline C, T, L-spine tenderness  Skin:    General: Skin is warm and dry.  Neurological:     General: No focal deficit present.     Mental Status: She is alert.    ED Results / Procedures / Treatments   Labs (all labs ordered are listed, but only abnormal results are displayed) Labs Reviewed  BASIC METABOLIC PANEL - Abnormal; Notable for the following components:      Result Value   Glucose, Bld 101 (*)    All other components within normal limits  CBC - Abnormal; Notable for the following components:   Platelets 424 (*)    All other components within normal limits  TROPONIN I (HIGH SENSITIVITY)  TROPONIN I (HIGH SENSITIVITY)    EKG EKG Interpretation Date/Time:  Thursday May 21 2023 07:04:55 EST Ventricular Rate:  84 PR Interval:  141 QRS Duration:  100 QT Interval:  387 QTC Calculation: 458 R Axis:   54  Text Interpretation: Sinus rhythm Low voltage, precordial leads No significant  change since last tracing Confirmed by Gwyneth Sprout ([id number]) on 05/21/2023 7:21:02 AM  Radiology DG Chest 2 View Result Date: 05/21/2023 CLINICAL DATA:  Chest pain with right shoulder radiation. Nausea and shortness of breath. EXAM: CHEST - 2 VIEW COMPARISON:  PA Lat 01/17/2021 FINDINGS: The heart size and mediastinal contours are within normal limits. Both lungs are clear. The visualized skeletal structures are unremarkable. IMPRESSION: No active cardiopulmonary disease.  Stable chest. Electronically Signed   By: Almira Bar M.D.   On: 05/21/2023 02:45   Procedures Procedures    Medications Ordered in ED Medications  albuterol (VENTOLIN HFA) 108 (90 Base) MCG/ACT inhaler 2 puff (2 puffs Inhalation Given 05/21/23 0848)    ED Course/ Medical Decision Making/ A&P  Medical Decision Making Amount and/or Complexity of Data Reviewed Labs: ordered. Radiology: ordered.  Risk Prescription drug management.   Vital signs stable, patient afebrile.  Physical exam overall reassuring, the patient has left chest wall tenderness to palpation with paraspinal muscle tenderness to palpation in the thoracic region.  CBC with no leukocytosis or anemia.  Metabolic panel with no gross metabolic or electrolyte abnormality.  Troponin 4, 3.  Chest x-ray with no consolidation concerning for pneumonia, pulmonary edema, or pneumothorax.  EKG without ischemic changes.  I have low suspicion for ACS and patient has a low risk HEAR score, I have encouraged her to follow-up with her primary care physician related to cardiac restratification.  Presentation is not consistent with aortic dissection or PE, additionally patient is not hypoxic or tachycardic.  She reports she has run out of her albuterol inhaler and thinks her asthma has been causing more shortness of breath than usual, we have provided her an albuterol inhaler in the ED.  Etiology of her symptoms may be musculoskeletal  pain versus her asthma.  Additionally patient reports she has been under a lot of stress recently which she feels may be contributory.  Results of laboratory and imaging evaluations were discussed with the patient and her husband.  Discussed using albuterol as prescribed.  Encouraged patient to follow-up with her PCP.  Strict return precautions were discussed, and the patient voiced understanding.  Patient was discharged in stable condition.        Final Clinical Impression(s) / ED Diagnoses Final diagnoses:  Chest pain, unspecified type    Rx / DC Orders ED Discharge Orders     None         Janyth Pupa, MD 05/21/23 1308    Gwyneth Sprout, MD 05/21/23 2052

## 2023-05-21 NOTE — ED Triage Notes (Addendum)
Patient arrived during downtime , reports central chest pain radiating to left posterior shoulder this evening with nausea and SOB . Blood tests collected at triage .

## 2023-05-21 NOTE — Discharge Instructions (Signed)
You were seen here today for chest pain.  Your workup showed reassuring electrolytes, cell counts, kidney function, and heart enzymes.  You do not have pneumonia or problems with your lungs on your chest x-ray.  Please use albuterol as prescribed.  Please follow-up with your PCP.  Please return to the emergency department for increasing chest pain, shortness of breath, severe vomiting or inability to keep down food, loss of consciousness, or any worsening symptom or concern.

## 2023-05-22 ENCOUNTER — Ambulatory Visit (INDEPENDENT_AMBULATORY_CARE_PROVIDER_SITE_OTHER): Payer: BC Managed Care – PPO | Admitting: Medical

## 2023-05-22 ENCOUNTER — Other Ambulatory Visit: Payer: BC Managed Care – PPO

## 2023-05-22 ENCOUNTER — Telehealth: Payer: Self-pay | Admitting: Podiatry

## 2023-05-22 VITALS — BP 145/80 | HR 92 | Resp 18 | Ht 64.0 in | Wt 225.0 lb

## 2023-05-22 DIAGNOSIS — F411 Generalized anxiety disorder: Secondary | ICD-10-CM | POA: Diagnosis not present

## 2023-05-22 DIAGNOSIS — R079 Chest pain, unspecified: Secondary | ICD-10-CM | POA: Diagnosis not present

## 2023-05-22 DIAGNOSIS — R0789 Other chest pain: Secondary | ICD-10-CM | POA: Diagnosis not present

## 2023-05-22 DIAGNOSIS — M940 Chondrocostal junction syndrome [Tietze]: Secondary | ICD-10-CM | POA: Diagnosis not present

## 2023-05-22 MED ORDER — CLONAZEPAM 0.5 MG PO TABS: ORAL_TABLET | ORAL | 0 refills | Status: DC

## 2023-05-22 MED ORDER — SERTRALINE HCL 25 MG PO TABS: 25.0000 mg | ORAL_TABLET | Freq: Every day | ORAL | 2 refills | Status: DC

## 2023-05-22 MED ORDER — METHYLPREDNISOLONE 4 MG PO TABS: ORAL_TABLET | ORAL | 0 refills | Status: DC

## 2023-05-22 MED ORDER — KETOROLAC TROMETHAMINE 60 MG/2ML IM SOLN
60.0000 mg | Freq: Once | INTRAMUSCULAR | Status: AC
Start: 2023-05-22 — End: 2023-05-22
  Administered 2023-05-22: 60 mg via INTRAMUSCULAR

## 2023-05-22 NOTE — Progress Notes (Signed)
Subjective:    Patient ID: Lorraine Conrad, female    DOB: 09-20-1970, 52 y.o.   MRN: [id number]  HPI  Discussed the use of AI scribe software for clinical note transcription with the patient, who gave verbal consent to proceed.  History of Present Illness   Pt presented with chest pain that began the previous morning. The pain was located in the middle of the chest and was triggered by bending over to put on shoes. The episode lasted for a few minutes and was not associated with jaw pain, nausea, vomiting, or arm pain. However, the patient reported intermittent chest pain and back pain throughout the day. The back pain was described as coming and going, located around the left shoulder blade and in the middle of the back. The patient denied any pain or discomfort when lifting or rotating the left arm, but noted a feeling of tightness.  Two days prior, the patient had presented to the emergency department with chest pain that started in the center of the chest and radiated to the shoulder and back. The pain was not exacerbated by exertion, but the patient reported shortness of breath when walking up the stairs and feeling nauseous. The emergency department evaluation, which included an EKG and two sets of troponin tests, was negative. The patient was discharged with a diagnosis of musculoskeletal pain and an albuterol inhaler.           Review of Systems  Constitutional:  Negative for fatigue and fever.  HENT:  Negative for dental problem and ear discharge.   Respiratory:  Negative for cough, chest tightness, wheezing and stridor.   Cardiovascular:  Negative for chest pain and palpitations.  Gastrointestinal:  Negative for abdominal pain and blood in stool.  Genitourinary:  Negative for difficulty urinating and dysuria.  Musculoskeletal:  Negative for back pain and joint swelling.  Skin:  Negative for rash.  Neurological:  Negative for dizziness and light-headedness.  Hematological:   Negative for adenopathy. Does not bruise/bleed easily.  Psychiatric/Behavioral:  Positive for dysphoric mood. Negative for behavioral problems, decreased concentration and suicidal ideas. The patient is nervous/anxious.        Pt cries and emotional mid interview. Her dad passed away on 2023-05-01. Ever since then she feels some occasional chest pain.  I paused abridge during her desciption of mood/anxiety at her request.   Past Medical History:  Diagnosis Date   Allergy    SEASONAL   Anemia    Arthritis    "spine" (03/01/2014)   Asthma    Chronic lower back pain    Endometriosis    Erosive gastritis    External hemorrhoids    GERD (gastroesophageal reflux disease)    H/O shortness of breath 2014   Cardiopulmonary exercise test results   Headache    "probably monthly" (03/01/2014)   Migraine 1998; 02/2014   "this one's lasted 10 days straight" (03/01/2014)   OSA on CPAP    Ovarian cyst, left    Proctalgia fugax    Sleep apnea    on cpap   Thyroid disease    thyroid nodule     Social History   Socioeconomic History   Marital status: Married    Spouse name: Not on file   Number of children: 2   Years of education: Not on file   Highest education level: Not on file  Occupational History   Occupation: homemaker  Tobacco Use   Smoking status: Never   Smokeless tobacco:  Never   Tobacco comments:    father smoked in house growing up per pt.   Vaping Use   Vaping status: Never Used  Substance and Sexual Activity   Alcohol use: Yes    Alcohol/week: 0.0 standard drinks of alcohol    Comment: 12-23-16 "I'll have 1-2 drinks maybe 3-4 times/yr"   Drug use: No   Sexual activity: Yes    Birth control/protection: Surgical  Other Topics Concern   Not on file  Social History Narrative   Daily caffiene use: 6 cups per day   Patient does not get regular excercise   Social Drivers of Health   Financial Resource Strain: Conrad Risk  (09/23/2022)   Received from [facility], [facility]   Overall Financial Resource Strain (CARDIA)    Difficulty of Paying Living Expenses: Not hard at all  Food Insecurity: No Food Insecurity (09/23/2022)   Received from [facility], [facility]   Hunger Vital Sign    Worried About Running Out of Food in the Last Year: Never true    Ran Out of Food in the Last Year: Never true  Transportation Needs: No Transportation Needs (09/23/2022)   Received from [facility], [facility]   PRAPARE - Transportation    Lack of Transportation (Medical): No    Lack of Transportation (Non-Medical): No  Physical Activity: Not on file  Stress: Not on file  Social Connections: Unknown (10/18/2021)   Received from [facility], [facility]   Social Network    Social Network: Not on file  Intimate Partner Violence: Unknown (09/09/2021)   Received from [facility], [facility]   HITS    Physically Hurt: Not on file    Insult or Talk Down To: Not on file    Threaten Physical Harm: Not on file    Scream or Curse: Not on file    Past Surgical History:  Procedure Laterality Date   ABDOMINAL HYSTERECTOMY  04/2013   BREAST BIOPSY Bilateral 10/05/2000   BREAST BIOPSY Right 08/26/2019   Fibroadenoma   BREAST EXCISIONAL BIOPSY Left 1991   CESAREAN SECTION  2002; 2006   COLONOSCOPY     LAPAROSCOPIC OVARIAN CYSTECTOMY  ~ 1999   NASAL SEPTUM SURGERY  ~ 1991   OOPHORECTOMY Right 2015   TEMPOROMANDIBULAR JOINT SURGERY  ~ 1992   UPPER GASTROINTESTINAL ENDOSCOPY      Family History  Problem Relation Age of Onset   Prostate cancer Paternal Grandfather    Colon polyps Paternal Grandfather    Colon polyps Father    Heart disease Father    Diverticulitis Mother    Colon polyps Paternal Grandmother    Breast cancer Paternal Grandmother    Colon cancer Neg Hx    Esophageal cancer Neg Hx    Rectal cancer Neg Hx    Stomach cancer Neg Hx    Pancreatic cancer Neg Hx     Allergies  Allergen Reactions   Promethazine Hcl Other (See Comments)     Violent tremors    Cephalexin Diarrhea and Itching   Codeine Other (See Comments)    Was a child when she took this.   Dilaudid [Hydromorphone] Nausea And Vomiting   Soy Allergy (Do Not Select) Itching   Azithromycin Rash    Head to toe rash    Current Outpatient Medications on File Prior to Visit  Medication Sig Dispense Refill   acetaminophen (TYLENOL) 500 MG tablet Take 500-1,000 mg by mouth every 6 (six) hours as needed  for moderate pain (pain score 4-6).     albuterol (VENTOLIN HFA) 108 (90 Base) MCG/ACT inhaler Inhale 2 puffs into the lungs every 6 (six) hours as needed for wheezing or shortness of breath. 8 g 2   atorvastatin (LIPITOR) 10 MG tablet TAKE 1 TABLET BY MOUTH EVERY DAY 90 tablet 2   Azelastine HCl 137 MCG/SPRAY SOLN PLACE 1 SPRAY INTO BOTH NOSTRILS 2 TIMES DAILY AS DIRECTED (Patient taking differently: Place 1 spray into both nostrils 2 (two) times daily as needed (Rhinitis).) 30 mL 2   budesonide-formoterol (SYMBICORT) 160-4.5 MCG/ACT inhaler Inhale 2 puffs into the lungs 2 (two) times daily. (Patient not taking: Reported on 05/21/2023) 1 each 12   celecoxib (CELEBREX) 200 MG capsule Take 1 capsule (200 mg total) by mouth daily. (Patient taking differently: Take 200 mg by mouth 2 (two) times daily as needed for moderate pain (pain score 4-6).) 20 capsule 0   ciclopirox (PENLAC) 8 % solution Apply topically at bedtime. Apply over nail and surrounding skin. Apply daily over previous coat. After seven (7) days, may remove with alcohol and continue cycle. (Patient taking differently: Apply 1 Application topically at bedtime as needed. Apply over nail and surrounding skin. Apply daily over previous coat. After seven (7) days, may remove with alcohol and continue cycle.) 6.6 mL 0   fluticasone (FLONASE) 50 MCG/ACT nasal spray SPRAY 2 SPRAYS INTO EACH NOSTRIL EVERY DAY (Patient taking differently: Place 2 sprays into both nostrils daily as needed for allergies or rhinitis.) 16 mL  5   lansoprazole (PREVACID) 30 MG capsule Take 30 mg by mouth daily as needed (Indigestion).     levocetirizine (XYZAL) 5 MG tablet Take 5 mg by mouth daily as needed for allergies.     predniSONE (STERAPRED UNI-PAK 21 TAB) 10 MG (21) TBPK tablet Take by mouth. (Patient not taking: Reported on 05/21/2023)     terbinafine (LAMISIL) 250 MG tablet Take 250 mg by mouth daily as needed (Fungus).     vitamin B-12 (CYANOCOBALAMIN) 100 MCG tablet Take 100 mcg by mouth daily.     No current facility-administered medications on file prior to visit.    BP (!) 145/80   Pulse 92   Resp 18   Ht 5\' 4"  (1.626 m)   Wt 225 lb (102.1 kg)   LMP 02/08/2013   SpO2 99%   BMI 38.62 kg/m        Objective:   Physical Exam  General Mental Status- Alert. General Appearance- Not in acute distress.   Skin General: Color- Normal Color. Moisture- Normal Moisture.  Neck Carotid Arteries- Normal color. Moisture- Normal Moisture. No carotid bruits. No JVD.  Chest and Lung Exam Auscultation: Breath Sounds:-Normal.  Cardiovascular Auscultation:Rythm- Regular. Murmurs & Other Heart Sounds:Auscultation of the heart reveals- No Murmurs.  Abdomen Inspection:-Inspeection Normal. Palpation/Percussion:Note:No mass. Palpation and Percussion of the abdomen reveal- Non Tender, Non Distended + BS, no rebound or guarding.   Neurologic Cranial Nerve exam:- CN III-XII intact(No nystagmus), symmetric smile. Strength:- 5/5 equal and symmetric strength both upper and lower extremities.   NECK: Left trapezius tender to palpation. CHEST: Sternocostal junction tender to palpation. Pain is reproducible MUSCULOSKELETAL: Left scapular region tender to palpation.    Lower extremity-calf symmetric to palpation.  No pedal edema.  Negative Homans' signs. Assessment & Plan:   Patient Instructions  Chest Pain. costochondritis like.  Presented with intermittent chest pain, initially evaluated in the emergency department  with negative EKG, chest x-ray, and two sets of troponins.  Pain is reproducible on palpation of the costochondral junction and left trapezius, suggesting musculoskeletal origin. However, due to a brief episode of chest pain this morning, another troponin will be checked for safety. -Administer Toradol 60mg  today for pain relief. -Start Medrol 4mg  6-day dose pack tomorrow. -Check troponin stat today and communicate results via MyChart. -If + troponin advise ED evaluation. -if negative but pain worsens ED evaluation as well -Also if shortness of breath occurs advise ED.  -ekg today showed sinus rythm. Rate of ekg 98-101 on 2 ekgs. no ischemic changes. Some poor connection/artifact on both ekg. But overall looks normal.  Anxiety -rx sertraline 25 mg daily -Use Clonazepam twice daily as needed for severe anxiety or panic attacks.  Follow-up in 2 weeks to assess response to treatment. If severe chest pain or symptoms suggestive of cardiac origin occur, patient is advised to return to the emergency department.   Time spent with patient today was 42  minutes which consisted of chart revdiew, discussing diagnosis, work up treatment and documentation.

## 2023-05-22 NOTE — Patient Instructions (Signed)
Chest Pain. costochondritis like.  Presented with intermittent chest pain, initially evaluated in the emergency department with negative EKG, chest x-ray, and two sets of troponins. Pain is reproducible on palpation of the costochondral junction and left trapezius, suggesting musculoskeletal origin. However, due to a brief episode of chest pain this morning, another troponin will be checked for safety. -Administer Toradol 60mg  today for pain relief. -Start Medrol 4mg  6-day dose pack tomorrow. -Check troponin stat today and communicate results via MyChart. -If + troponin advise ED evaluation. -if negative but pain worsens ED evaluation as well -Also if shortness of breath occurs advise ED.  -ekg today showed sinus rythm. Rate of ekg 98-101 on 2 ekgs. no ischemic changes. Some poor connection/artifact on both ekg. But overall looks normal.  Anxiety -rx sertraline 25 mg daily -Use Clonazepam twice daily as needed for severe anxiety or panic attacks.  Follow-up in 2 weeks to assess response to treatment. If severe chest pain or symptoms suggestive of cardiac origin occur, patient is advised to return to the emergency department.

## 2023-05-22 NOTE — Telephone Encounter (Signed)
Pt left message yesterday 12/12 at 507pm stating she has been in the ed at [facility] until mid morning and is not going to make the appt today.  I left message for pt that I have cxled the appt and she is already scheduled for another appt 07/03/23 at 830am

## 2023-05-25 LAB — TROPONIN T, HIGH SENSITIVITY (HS-TNT): Troponin T (Highly Sensitive): <6 ng/L (ref <15)

## 2023-05-26 ENCOUNTER — Telehealth: Payer: BC Managed Care – PPO | Admitting: Medical

## 2023-05-26 DIAGNOSIS — F411 Generalized anxiety disorder: Secondary | ICD-10-CM

## 2023-05-26 DIAGNOSIS — R0789 Other chest pain: Secondary | ICD-10-CM | POA: Diagnosis not present

## 2023-05-26 MED ORDER — BUSPIRONE HCL 7.5 MG PO TABS: 7.5000 mg | ORAL_TABLET | Freq: Two times a day (BID) | ORAL | 0 refills | Status: DC

## 2023-05-26 NOTE — Progress Notes (Signed)
Subjective:    Patient ID: Lorraine Conrad, female    DOB: [birth year], 52 y.o.   MRN: [id number]  HPI  Virtual Visit via Video Note  I connected with Lorraine Conrad on 05/26/23 at  2:00 PM EST by a video enabled telemedicine application and verified that I am speaking with the correct person using two identifiers.  Location: Patient: parked car in [place] Provider: office   I discussed the limitations of evaluation and management by telemedicine and the availability of in person appointments. The patient expressed understanding and agreed to proceed.  History of Present Illness: Discussed the use of AI scribe software for clinical note transcription with the patient, who gave verbal consent to proceed.  History of Present Illness   The patient, with a recent prescription for prednisone and sertraline, reports hesitancy in starting the sertraline due to concerns about potential interactions with other medications and the delayed onset of its therapeutic effects. They express a desire for immediate relief from their symptoms, which they attribute to stress. The patient has been experiencing chest discomfort, which they describe as needing relief. They also report episodes of night sweats since starting prednisone.  The patient has not yet started the prescribed sertraline, expressing reservations about its potential side effects and the time it takes to become effective. They have been in contact with the pharmacy for clarification and express a preference for a medication that provides immediate relief.  The patient also reports a recent increase in chest discomfort, describing it as an "electrical feeling." They note that the discomfort has somewhat improved but remains present, particularly when wearing restrictive clothing. The patient attributes this discomfort primarily to stress rather than a physical condition. Despite the discomfort, the patient reports no current chest pain. They have  been taking prednisone, which they believe may have contributed to a partial improvement in their symptoms.  The patient's chest wall remains tender to palpation, indicating probable cartilage pain. However, the patient reports that the pain is less noticeable when taking deep breaths. The patient has not started the prescribed sertraline and is considering alternative medications for immediate relief.        Observations/Objective: General-no acute distress, pleasant, oriented. Lungs- on inspection lungs appear unlabored. Neck- no tracheal deviation or jvd on inspection. Neuro- gross motor function appears intact.  Anterior chest- pt confirms/peforms palpation on costochondral junction on video and states has pain.  Assessment and Plan: Assessment and Plan    Atypical chest pain/costochondritis like mostly Persistent chest pain, likely costochondritis given tenderness to palpation and negative troponins. Partial improvement with Medrol. -Continue Medrol as prescribed. -Refer to Cardiology for further evaluation given persistence of symptoms. and remote hx of chest pain on review back in 2017.  Anxiety High level of stress and anxiety, particularly related to recent loss of father. Patient has reservations about starting Sertraline. -Start Buspar 7.5mg  twice daily for anxiety. -Use Clonazepam as needed for severe anxiety or panic attacks. -Hold Sertraline for now. -Schedule follow-up in two weeks to assess response to Buspar.   Follow up 2 weeks or sooner if needed.        Esperanza Richters, PA-C   Follow Up Instructions:    I discussed the assessment and treatment plan with the patient. The patient was provided an opportunity to ask questions and all were answered. The patient agreed with the plan and demonstrated an understanding of the instructions.   The patient was advised to call back or seek an in-person evaluation  if the symptoms worsen or if the condition fails to improve  as anticipated.     Esperanza Richters, PA-C    Review of Systems  Constitutional:  Negative for chills, fatigue and fever.  Respiratory:  Negative for cough, chest tightness, shortness of breath and wheezing.   Cardiovascular:  Positive for chest pain. Negative for palpitations.       See hpi.  Gastrointestinal:  Negative for abdominal pain, constipation, nausea and vomiting.  Genitourinary:  Negative for dyspareunia, dysuria and frequency.  Musculoskeletal:  Negative for back pain and myalgias.  Skin:  Negative for rash.  Neurological:  Negative for dizziness, weakness and light-headedness.  Hematological:  Negative for adenopathy. Does not bruise/bleed easily.  Psychiatric/Behavioral:  Negative for behavioral problems, decreased concentration, dysphoric mood and sleep disturbance. The patient is nervous/anxious.     Past Medical History:  Diagnosis Date   Allergy    SEASONAL   Anemia    Arthritis    "spine" (03/01/2014)   Asthma    Chronic lower back pain    Endometriosis    Erosive gastritis    External hemorrhoids    GERD (gastroesophageal reflux disease)    H/O shortness of breath 2014   Cardiopulmonary exercise test results   Headache    "probably monthly" (03/01/2014)   Migraine 1998; 02/2014   "this one's lasted 10 days straight" (03/01/2014)   OSA on CPAP    Ovarian cyst, left    Proctalgia fugax    Sleep apnea    on cpap   Thyroid disease    thyroid nodule     Social History   Socioeconomic History   Marital status: Married    Spouse name: Not on file   Number of children: 2   Years of education: Not on file   Highest education level: Not on file  Occupational History   Occupation: homemaker  Tobacco Use   Smoking status: Never   Smokeless tobacco: Never   Tobacco comments:    father smoked in house growing up per pt.   Vaping Use   Vaping status: Never Used  Substance and Sexual Activity   Alcohol use: Yes    Alcohol/week: 0.0 standard drinks  of alcohol    Comment: 12-23-16 "I'll have 1-2 drinks maybe 3-4 times/yr"   Drug use: No   Sexual activity: Yes    Birth control/protection: Surgical  Other Topics Concern   Not on file  Social History Narrative   Daily caffiene use: 6 cups per day   Patient does not get regular excercise   Social Drivers of Health   Financial Resource Strain: Conrad Risk  (09/23/2022)   Received from [facility], [facility]   Overall Financial Resource Strain (CARDIA)    Difficulty of Paying Living Expenses: Not hard at all  Food Insecurity: No Food Insecurity (09/23/2022)   Received from [facility], [facility]   Hunger Vital Sign    Worried About Running Out of Food in the Last Year: Never true    Ran Out of Food in the Last Year: Never true  Transportation Needs: No Transportation Needs (09/23/2022)   Received from [facility], [facility]   PRAPARE - Transportation    Lack of Transportation (Medical): No    Lack of Transportation (Non-Medical): No  Physical Activity: Not on file  Stress: Not on file  Social Connections: Unknown (10/18/2021)   Received from [facility], [facility]   Social Network    Social Network: Not  on file  Intimate Partner Violence: Unknown (09/09/2021)   Received from [facility], [facility]   HITS    Physically Hurt: Not on file    Insult or Talk Down To: Not on file    Threaten Physical Harm: Not on file    Scream or Curse: Not on file    Past Surgical History:  Procedure Laterality Date   ABDOMINAL HYSTERECTOMY  04/2013   BREAST BIOPSY Bilateral 10/05/2000   BREAST BIOPSY Right 08/26/2019   Fibroadenoma   BREAST EXCISIONAL BIOPSY Left 1991   CESAREAN SECTION  2002; 2006   COLONOSCOPY     LAPAROSCOPIC OVARIAN CYSTECTOMY  ~ 1999   NASAL SEPTUM SURGERY  ~ 1991   OOPHORECTOMY Right 2015   TEMPOROMANDIBULAR JOINT SURGERY  ~ 1992   UPPER GASTROINTESTINAL ENDOSCOPY      Family History  Problem Relation Age of Onset   Prostate cancer  Paternal Grandfather    Colon polyps Paternal Grandfather    Colon polyps Father    Heart disease Father    Diverticulitis Mother    Colon polyps Paternal Grandmother    Breast cancer Paternal Grandmother    Colon cancer Neg Hx    Esophageal cancer Neg Hx    Rectal cancer Neg Hx    Stomach cancer Neg Hx    Pancreatic cancer Neg Hx     Allergies  Allergen Reactions   Promethazine Hcl Other (See Comments)    Violent tremors    Cephalexin Diarrhea and Itching   Codeine Other (See Comments)    Was a child when she took this.   Dilaudid [Hydromorphone] Nausea And Vomiting   Soy Allergy (Do Not Select) Itching   Azithromycin Rash    Head to toe rash    Current Outpatient Medications on File Prior to Visit  Medication Sig Dispense Refill   acetaminophen (TYLENOL) 500 MG tablet Take 500-1,000 mg by mouth every 6 (six) hours as needed for moderate pain (pain score 4-6).     albuterol (VENTOLIN HFA) 108 (90 Base) MCG/ACT inhaler Inhale 2 puffs into the lungs every 6 (six) hours as needed for wheezing or shortness of breath. 8 g 2   atorvastatin (LIPITOR) 10 MG tablet TAKE 1 TABLET BY MOUTH EVERY DAY 90 tablet 2   Azelastine HCl 137 MCG/SPRAY SOLN PLACE 1 SPRAY INTO BOTH NOSTRILS 2 TIMES DAILY AS DIRECTED (Patient taking differently: Place 1 spray into both nostrils 2 (two) times daily as needed (Rhinitis).) 30 mL 2   budesonide-formoterol (SYMBICORT) 160-4.5 MCG/ACT inhaler Inhale 2 puffs into the lungs 2 (two) times daily. (Patient not taking: Reported on 05/21/2023) 1 each 12   celecoxib (CELEBREX) 200 MG capsule Take 1 capsule (200 mg total) by mouth daily. (Patient taking differently: Take 200 mg by mouth 2 (two) times daily as needed for moderate pain (pain score 4-6).) 20 capsule 0   ciclopirox (PENLAC) 8 % solution Apply topically at bedtime. Apply over nail and surrounding skin. Apply daily over previous coat. After seven (7) days, may remove with alcohol and continue cycle.  (Patient taking differently: Apply 1 Application topically at bedtime as needed. Apply over nail and surrounding skin. Apply daily over previous coat. After seven (7) days, may remove with alcohol and continue cycle.) 6.6 mL 0   clonazePAM (KLONOPIN) 0.5 MG tablet 1 tab po bid prn anxiety 8 tablet 0   fluticasone (FLONASE) 50 MCG/ACT nasal spray SPRAY 2 SPRAYS INTO EACH NOSTRIL EVERY DAY (Patient taking differently: Place  2 sprays into both nostrils daily as needed for allergies or rhinitis.) 16 mL 5   lansoprazole (PREVACID) 30 MG capsule Take 30 mg by mouth daily as needed (Indigestion).     levocetirizine (XYZAL) 5 MG tablet Take 5 mg by mouth daily as needed for allergies.     methylPREDNISolone (MEDROL) 4 MG tablet Standard 6 day taper 21 tablet 0   predniSONE (STERAPRED UNI-PAK 21 TAB) 10 MG (21) TBPK tablet Take by mouth. (Patient not taking: Reported on 05/21/2023)     sertraline (ZOLOFT) 25 MG tablet Take 1 tablet (25 mg total) by mouth daily. 30 tablet 2   terbinafine (LAMISIL) 250 MG tablet Take 250 mg by mouth daily as needed (Fungus).     vitamin B-12 (CYANOCOBALAMIN) 100 MCG tablet Take 100 mcg by mouth daily.     No current facility-administered medications on file prior to visit.    LMP 02/08/2013        Objective:   Physical Exam  General Mental Status- Alert. General Appearance- Not in acute distress.   Skin General: Color- Normal Color. Moisture- Normal Moisture.  Neck Carotid Arteries- Normal color. Moisture- Normal Moisture. No carotid bruits. No JVD.  Chest and Lung Exam Auscultation: Breath Sounds:-CTA  Cardiovascular Auscultation:Rythm- RRR Murmurs & Other Heart Sounds:Auscultation of the heart reveals- No Murmurs.  Abdomen Inspection:-Inspeection Normal. Palpation/Percussion:Note:No mass. Palpation and Percussion of the abdomen reveal- Non Tender, Non Distended + BS, no rebound or guarding.   Neurologic Cranial Nerve exam:- CN III-XII intact(No  nystagmus), symmetric smile. Strength:- 5/5 equal and symmetric strength both upper and lower extremities.       Assessment & Plan:

## 2023-05-26 NOTE — Patient Instructions (Signed)
Atypical chest pain/costochondritis like mostly Persistent chest pain, likely costochondritis given tenderness to palpation and negative troponins. Partial improvement with Medrol. -Continue Medrol as prescribed. -Refer to Cardiology for further evaluation given persistence of symptoms. and remote hx of chest pain on review back in 2017.  Anxiety High level of stress and anxiety, particularly related to recent loss of father. Patient has reservations about starting Sertraline. -Start Buspar 7.5mg  twice daily for anxiety. -Use Clonazepam as needed for severe anxiety or panic attacks. -Hold Sertraline for now. -Schedule follow-up in two weeks to assess response to Buspar.   Follow up 2 weeks or sooner if needed.

## 2023-05-29 ENCOUNTER — Ambulatory Visit (INDEPENDENT_AMBULATORY_CARE_PROVIDER_SITE_OTHER): Payer: BC Managed Care – PPO | Admitting: Medical

## 2023-05-29 VITALS — BP 120/88 | HR 76 | Temp 97.9°F | Resp 18 | Ht 64.0 in | Wt 224.0 lb

## 2023-05-29 DIAGNOSIS — H9202 Otalgia, left ear: Secondary | ICD-10-CM | POA: Diagnosis not present

## 2023-05-29 DIAGNOSIS — R059 Cough, unspecified: Secondary | ICD-10-CM

## 2023-05-29 DIAGNOSIS — M791 Myalgia, unspecified site: Secondary | ICD-10-CM

## 2023-05-29 DIAGNOSIS — J3489 Other specified disorders of nose and nasal sinuses: Secondary | ICD-10-CM | POA: Diagnosis not present

## 2023-05-29 LAB — POCT INFLUENZA A/B
Influenza A, POC: NEGATIVE
Influenza B, POC: NEGATIVE

## 2023-05-29 LAB — POC COVID19 BINAXNOW: SARS Coronavirus 2 Ag: NEGATIVE

## 2023-05-29 MED ORDER — DOXYCYCLINE HYCLATE 100 MG PO TABS: 100.0000 mg | ORAL_TABLET | Freq: Two times a day (BID) | ORAL | 0 refills | Status: DC

## 2023-05-29 MED ORDER — FLUTICASONE PROPIONATE 50 MCG/ACT NA SUSP: 2.0000 | Freq: Every day | NASAL | 1 refills | Status: DC

## 2023-05-29 MED ORDER — BENZONATATE 100 MG PO CAPS: 100.0000 mg | ORAL_CAPSULE | Freq: Three times a day (TID) | ORAL | 0 refills | Status: DC | PRN

## 2023-05-29 NOTE — Patient Instructions (Signed)
Acute Sinusitis Rapid onset of nasal congestion, facial pain, and low-grade fever. Mild ear discomfort and tender submandibular nodes. No significant purulent discharge. Possible early sinus infection. -Start Doxycycline 100mg  twice daily for 10 days. -Continue Flonase for nasal congestion. DC astelin -Discontinue Zycam due to possible association with facial flushing. -Check COVID and flu tests both negative -You mentioned meningitis. i don't think that is the case but if severe ha and neck stiffness then advise ED evaluation.  Follow-up in 7-10 days or sooner if symptoms persist or worsen.

## 2023-05-29 NOTE — Progress Notes (Signed)
Subjective:    Patient ID: Lorraine Conrad, female    DOB: [birth year], 52 y.o.   MRN: [id number]  HPI  Discussed the use of AI scribe software for clinical note transcription with the patient, who gave verbal consent to proceed.  History of Present Illness   The patient presented with a two-day history of nasal congestion, predominantly on the left side, accompanied by facial stuffiness. She reported using Astelin spray, which resulted in a burning sensation and significant nasal drainage. The patient also used Zicam spray, which was followed by facial flushing, lasting from the night until the following morning. The patient was unsure if the facial flushing was a reaction to the Zicam spray. Accompanying these symptoms, the patient experienced a occipital headache, requiring the use of ice packs for relief.  In addition to the nasal symptoms, the patient reported ear discomfort and a burning sensation in the throat, which was more internal than superficial. She also noted a Conrad-grade fever of  99 and 102 degrees Fahrenheit. The patient experienced body aches throughout the night, but did not specify the exact location or severity of these aches.  The patient had previously been diagnosed with viral meningitis and noted that the current symptoms reminded her of that episode. She had also taken DayQuil, but it was unclear whether this was before or after the onset of the current symptoms. The patient did not report any changes in her medication regimen or any new medications. The patient did not mention any recent travel, exposure to sick contacts, or changes in her environment.       Review of Systems  Constitutional:  Negative for chills, fatigue and fever.  HENT:  Positive for congestion, ear pain, sinus pressure and sinus pain.   Respiratory:  Positive for cough. Negative for chest tightness and wheezing.   Cardiovascular:  Negative for chest pain and palpitations.  Gastrointestinal:   Negative for abdominal pain.  Musculoskeletal:  Negative for back pain and gait problem.  Neurological:  Negative for dizziness, seizures and weakness.  Hematological:  Negative for adenopathy. Does not bruise/bleed easily.  Psychiatric/Behavioral:  Negative for behavioral problems and confusion.    Past Medical History:  Diagnosis Date   Allergy    SEASONAL   Anemia    Arthritis    "spine" (03/01/2014)   Asthma    Chronic lower back pain    Endometriosis    Erosive gastritis    External hemorrhoids    GERD (gastroesophageal reflux disease)    H/O shortness of breath 2014   Cardiopulmonary exercise test results   Headache    "probably monthly" (03/01/2014)   Migraine 1998; 02/2014   "this one's lasted 10 days straight" (03/01/2014)   OSA on CPAP    Ovarian cyst, left    Proctalgia fugax    Sleep apnea    on cpap   Thyroid disease    thyroid nodule     Social History   Socioeconomic History   Marital status: Married    Spouse name: Not on file   Number of children: 2   Years of education: Not on file   Highest education level: Not on file  Occupational History   Occupation: homemaker  Tobacco Use   Smoking status: Never   Smokeless tobacco: Never   Tobacco comments:    father smoked in house growing up per pt.   Vaping Use   Vaping status: Never Used  Substance and Sexual Activity   Alcohol use:  Yes    Alcohol/week: 0.0 standard drinks of alcohol    Comment: 12-23-16 "I'll have 1-2 drinks maybe 3-4 times/yr"   Drug use: No   Sexual activity: Yes    Birth control/protection: Surgical  Other Topics Concern   Not on file  Social History Narrative   Daily caffiene use: 6 cups per day   Patient does not get regular excercise   Social Drivers of Health   Financial Resource Strain: Conrad Risk  (09/23/2022)   Received from [facility], [facility]   Overall Financial Resource Strain (CARDIA)    Difficulty of Paying Living Expenses: Not hard at all  Food  Insecurity: No Food Insecurity (09/23/2022)   Received from [facility], [facility]   Hunger Vital Sign    Worried About Running Out of Food in the Last Year: Never true    Ran Out of Food in the Last Year: Never true  Transportation Needs: No Transportation Needs (09/23/2022)   Received from [facility], [facility]   PRAPARE - Transportation    Lack of Transportation (Medical): No    Lack of Transportation (Non-Medical): No  Physical Activity: Not on file  Stress: Not on file  Social Connections: Unknown (10/18/2021)   Received from [facility], [facility]   Social Network    Social Network: Not on file  Intimate Partner Violence: Unknown (09/09/2021)   Received from [facility], [facility]   HITS    Physically Hurt: Not on file    Insult or Talk Down To: Not on file    Threaten Physical Harm: Not on file    Scream or Curse: Not on file    Past Surgical History:  Procedure Laterality Date   ABDOMINAL HYSTERECTOMY  04/2013   BREAST BIOPSY Bilateral 10/05/2000   BREAST BIOPSY Right 08/26/2019   Fibroadenoma   BREAST EXCISIONAL BIOPSY Left 1991   CESAREAN SECTION  2002; 2006   COLONOSCOPY     LAPAROSCOPIC OVARIAN CYSTECTOMY  ~ 1999   NASAL SEPTUM SURGERY  ~ 1991   OOPHORECTOMY Right 2015   TEMPOROMANDIBULAR JOINT SURGERY  ~ 1992   UPPER GASTROINTESTINAL ENDOSCOPY      Family History  Problem Relation Age of Onset   Prostate cancer Paternal Grandfather    Colon polyps Paternal Grandfather    Colon polyps Father    Heart disease Father    Diverticulitis Mother    Colon polyps Paternal Grandmother    Breast cancer Paternal Grandmother    Colon cancer Neg Hx    Esophageal cancer Neg Hx    Rectal cancer Neg Hx    Stomach cancer Neg Hx    Pancreatic cancer Neg Hx     Allergies  Allergen Reactions   Promethazine Hcl Other (See Comments)    Violent tremors    Cephalexin Diarrhea and Itching   Codeine Other (See Comments)    Was a child when she  took this.   Dilaudid [Hydromorphone] Nausea And Vomiting   Soy Allergy (Do Not Select) Itching   Azithromycin Rash    Head to toe rash    Current Outpatient Medications on File Prior to Visit  Medication Sig Dispense Refill   acetaminophen (TYLENOL) 500 MG tablet Take 500-1,000 mg by mouth every 6 (six) hours as needed for moderate pain (pain score 4-6).     albuterol (VENTOLIN HFA) 108 (90 Base) MCG/ACT inhaler Inhale 2 puffs into the lungs every 6 (six) hours as needed for wheezing or shortness of  breath. 8 g 2   atorvastatin (LIPITOR) 10 MG tablet TAKE 1 TABLET BY MOUTH EVERY DAY 90 tablet 2   Azelastine HCl 137 MCG/SPRAY SOLN PLACE 1 SPRAY INTO BOTH NOSTRILS 2 TIMES DAILY AS DIRECTED (Patient taking differently: Place 1 spray into both nostrils 2 (two) times daily as needed (Rhinitis).) 30 mL 2   budesonide-formoterol (SYMBICORT) 160-4.5 MCG/ACT inhaler Inhale 2 puffs into the lungs 2 (two) times daily. (Patient not taking: Reported on 05/21/2023) 1 each 12   busPIRone (BUSPAR) 7.5 MG tablet Take 1 tablet (7.5 mg total) by mouth 2 (two) times daily. 60 tablet 0   celecoxib (CELEBREX) 200 MG capsule Take 1 capsule (200 mg total) by mouth daily. (Patient taking differently: Take 200 mg by mouth 2 (two) times daily as needed for moderate pain (pain score 4-6).) 20 capsule 0   ciclopirox (PENLAC) 8 % solution Apply topically at bedtime. Apply over nail and surrounding skin. Apply daily over previous coat. After seven (7) days, may remove with alcohol and continue cycle. (Patient taking differently: Apply 1 Application topically at bedtime as needed. Apply over nail and surrounding skin. Apply daily over previous coat. After seven (7) days, may remove with alcohol and continue cycle.) 6.6 mL 0   clonazePAM (KLONOPIN) 0.5 MG tablet 1 tab po bid prn anxiety 8 tablet 0   fluticasone (FLONASE) 50 MCG/ACT nasal spray SPRAY 2 SPRAYS INTO EACH NOSTRIL EVERY DAY (Patient taking differently: Place 2 sprays  into both nostrils daily as needed for allergies or rhinitis.) 16 mL 5   lansoprazole (PREVACID) 30 MG capsule Take 30 mg by mouth daily as needed (Indigestion).     levocetirizine (XYZAL) 5 MG tablet Take 5 mg by mouth daily as needed for allergies.     methylPREDNISolone (MEDROL) 4 MG tablet Standard 6 day taper 21 tablet 0   predniSONE (STERAPRED UNI-PAK 21 TAB) 10 MG (21) TBPK tablet Take by mouth. (Patient not taking: Reported on 05/21/2023)     sertraline (ZOLOFT) 25 MG tablet Take 1 tablet (25 mg total) by mouth daily. 30 tablet 2   terbinafine (LAMISIL) 250 MG tablet Take 250 mg by mouth daily as needed (Fungus).     vitamin B-12 (CYANOCOBALAMIN) 100 MCG tablet Take 100 mcg by mouth daily.     No current facility-administered medications on file prior to visit.    BP 120/88   Pulse 76   Temp 97.9 F (36.6 C)   Resp 18   Ht 5\' 4"  (1.626 m)   Wt 224 lb (101.6 kg)   LMP 02/08/2013   SpO2 97%   BMI 38.45 kg/m        Objective:   Physical Exam  General Mental Status- Alert. General Appearance- Not in acute distress.     Neck  No JVD.  Chest and Lung Exam Auscultation: Breath Sounds:-Normal.  Cardiovascular Auscultation:Rythm- Regular. Murmurs & Other Heart Sounds:Auscultation of the heart reveals- No Murmurs.  Abdomen Inspection:-Inspeection Normal. Palpation/Percussion:Note:No mass. Palpation and Percussion of the abdomen reveal- Non Tender, Non Distended + BS, no rebound or guarding.   Neurologic Cranial Nerve exam:- CN III-XII intact(No nystagmus), symmetric smile. Heal to Toe Gait exam:-Normal. Finger to Nose:- Normal/Intact Strength:- 5/5 equal and symmetric strength both upper and lower extremities.   Heent- left maxillary sinus tender. Both canals clear and tm normal. Boggy turbinates.    Assessment & Plan:   Patient Instructions  Acute Sinusitis Rapid onset of nasal congestion, facial pain, and Conrad-grade fever. Mild ear  discomfort and tender  submandibular nodes. No significant purulent discharge. Possible early sinus infection. -Start Doxycycline 100mg  twice daily for 10 days. -Continue Flonase for nasal congestion. DC astelin -Discontinue Zycam due to possible association with facial flushing. -Check COVID and flu tests both negative -You mentioned meningitis. i don't think that is the case but if severe ha and neck stiffness then advise ED evaluation.  Follow-up in 7-10 days or sooner if symptoms persist or worsen.

## 2023-06-04 ENCOUNTER — Ambulatory Visit: Payer: BC Managed Care – PPO | Admitting: Medical

## 2023-06-04 VITALS — BP 122/72 | HR 93 | Temp 98.2°F | Resp 18 | Ht 64.0 in | Wt 224.0 lb

## 2023-06-04 DIAGNOSIS — F411 Generalized anxiety disorder: Secondary | ICD-10-CM | POA: Diagnosis not present

## 2023-06-04 DIAGNOSIS — J0100 Acute maxillary sinusitis, unspecified: Secondary | ICD-10-CM

## 2023-06-04 NOTE — Progress Notes (Signed)
   Subjective:    Patient ID: Lorraine Conrad, female    DOB: [birth year], 52 y.o.   MRN: [id number]  HPI  Discussed the use of AI scribe software for clinical note transcription with the patient, who gave verbal consent to proceed.  History of Present Illness   The patient, with a history of anxiety and recent sinus infection, presents with persistent symptoms despite being six days into a ten-day course of doxycycline. She reports a 60% improvement in sinusitis symptoms, which initially included facial pain, Conrad-grade fever, and ear discomfort. The patient also experienced severe headaches and facial flushing, which required the use of ice packs for relief. These symptoms have since resolved.  The patient has been managing her anxiety with a Conrad dose of clonazepam (0.25mg ), used sparingly due to concerns about potential side effects. She has not yet started the prescribed Buspar 7.5mg  twice daily, intending to commence this medication after the resolution of the sinus infection. The patient has expressed a need for this medication, acknowledging ongoing anxiety issues.(Dad passed away recently)        Review of Systems  Constitutional:  Negative for chills, fatigue and fever.  HENT:  Positive for congestion and sinus pain.   Respiratory:  Positive for cough. Negative for chest tightness and wheezing.   Cardiovascular:  Negative for chest pain and palpitations.  Gastrointestinal:  Negative for abdominal pain.  Genitourinary:  Negative for dysuria.  Musculoskeletal:  Negative for back pain and neck pain.  Skin:  Negative for rash.  Neurological:  Positive for headaches. Negative for dizziness, seizures and weakness.       Ha resolved now. Only slight when coughs.  Hematological:  Negative for adenopathy. Does not bruise/bleed easily.  Psychiatric/Behavioral:  Negative for behavioral problems and dysphoric mood.    See hpi    Objective:   Physical Exam   General Mental Status- Alert.  General Appearance- Not in acute distress.   Skin General: Color- Normal Color. Moisture- Normal Moisture.  Neck No JVD.  Chest and Lung Exam Auscultation: Breath Sounds:-CTA  Cardiovascular Auscultation:Rythm- RRR Murmurs & Other Heart Sounds:Auscultation of the heart reveals- No Murmurs.  Abdomen Inspection:-Inspeection Normal. Palpation/Percussion:Note:No mass. Palpation and Percussion of the abdomen reveal- Non Tender, Non Distended + BS, no rebound or guarding.   Neurologic Cranial Nerve exam:- CN III-XII intact(No nystagmus), symmetric smile. Strength:- 5/5 equal and symmetric strength both upper and lower extremities.    Heent- faint left maxillary sinus pressure. Other sinus nontender.     Assessment & Plan:   Assessment and Plan    Sinusitis Partial improvement after 6 days of Doxycycline and Flonase. No fever or ear discomfort. Resolved facial flushing and headache. Mild residual facial pain and pressure. -Continue Doxycycline for 4 more days. -Continue Flonase as prescribed. -Consider injection antibiotic if symptoms persist after completing Doxycycline course.  Anxiety Controlled with occasional use of Clonazepam 0.25mg . Patient has not started Buspar 7.5mg  BID due to concerns about side effects and current sinusitis. -Start Buspar 7.5mg  BID after resolution of sinusitis. -Follow-up in 1 month after starting Buspar to assess response and tolerability.       Esperanza Richters, PA-C

## 2023-06-04 NOTE — Patient Instructions (Addendum)
Sinusitis Partial improvement after 6 days of Doxycycline and Flonase. No fever or ear discomfort. Resolved facial flushing and headache. Mild residual facial pain and pressure. -Continue Doxycycline for 4 more days. -Continue Flonase as prescribed. -Consider injection antibiotic if symptoms persist after completing Doxycycline course.  Anxiety Controlled with occasional use of Clonazepam 0.25mg . Patient has not started Buspar 7.5mg  BID due to concerns about side effects and current sinusitis. -Start Buspar 7.5mg  BID after resolution of sinusitis.  -Follow-up in 1 month after starting Buspar to assess response and tolerability.

## 2023-06-05 DIAGNOSIS — M79641 Pain in right hand: Secondary | ICD-10-CM | POA: Insufficient documentation

## 2023-06-05 DIAGNOSIS — R768 Other specified abnormal immunological findings in serum: Secondary | ICD-10-CM | POA: Insufficient documentation

## 2023-06-05 DIAGNOSIS — M79642 Pain in left hand: Secondary | ICD-10-CM | POA: Insufficient documentation

## 2023-06-05 DIAGNOSIS — M1991 Primary osteoarthritis, unspecified site: Secondary | ICD-10-CM | POA: Insufficient documentation

## 2023-06-05 DIAGNOSIS — K9041 Non-celiac gluten sensitivity: Secondary | ICD-10-CM | POA: Insufficient documentation

## 2023-06-08 DIAGNOSIS — M6281 Muscle weakness (generalized): Secondary | ICD-10-CM | POA: Diagnosis not present

## 2023-06-08 DIAGNOSIS — M25562 Pain in left knee: Secondary | ICD-10-CM | POA: Diagnosis not present

## 2023-06-08 DIAGNOSIS — M25511 Pain in right shoulder: Secondary | ICD-10-CM | POA: Diagnosis not present

## 2023-06-08 DIAGNOSIS — M5459 Other low back pain: Secondary | ICD-10-CM | POA: Diagnosis not present

## 2023-06-09 ENCOUNTER — Other Ambulatory Visit: Payer: Self-pay | Admitting: Medical

## 2023-06-13 ENCOUNTER — Other Ambulatory Visit: Payer: Self-pay | Admitting: Medical

## 2023-06-17 ENCOUNTER — Ambulatory Visit: Payer: BC Managed Care – PPO | Attending: Cardiology | Admitting: Cardiology

## 2023-06-17 ENCOUNTER — Ambulatory Visit: Payer: BC Managed Care – PPO | Admitting: Podiatry

## 2023-06-17 ENCOUNTER — Other Ambulatory Visit (HOSPITAL_BASED_OUTPATIENT_CLINIC_OR_DEPARTMENT_OTHER): Payer: Self-pay

## 2023-06-17 ENCOUNTER — Encounter: Payer: Self-pay | Admitting: Cardiology

## 2023-06-17 VITALS — BP 110/78 | HR 109 | Ht 64.0 in | Wt 227.2 lb

## 2023-06-17 DIAGNOSIS — M6281 Muscle weakness (generalized): Secondary | ICD-10-CM | POA: Diagnosis not present

## 2023-06-17 DIAGNOSIS — E782 Mixed hyperlipidemia: Secondary | ICD-10-CM

## 2023-06-17 DIAGNOSIS — M25562 Pain in left knee: Secondary | ICD-10-CM | POA: Diagnosis not present

## 2023-06-17 DIAGNOSIS — E669 Obesity, unspecified: Secondary | ICD-10-CM

## 2023-06-17 DIAGNOSIS — E785 Hyperlipidemia, unspecified: Secondary | ICD-10-CM | POA: Insufficient documentation

## 2023-06-17 DIAGNOSIS — R079 Chest pain, unspecified: Secondary | ICD-10-CM

## 2023-06-17 DIAGNOSIS — M5459 Other low back pain: Secondary | ICD-10-CM | POA: Diagnosis not present

## 2023-06-17 DIAGNOSIS — G4733 Obstructive sleep apnea (adult) (pediatric): Secondary | ICD-10-CM

## 2023-06-17 DIAGNOSIS — I5189 Other ill-defined heart diseases: Secondary | ICD-10-CM

## 2023-06-17 DIAGNOSIS — M25511 Pain in right shoulder: Secondary | ICD-10-CM | POA: Diagnosis not present

## 2023-06-17 MED ORDER — IVABRADINE HCL 7.5 MG PO TABS: 15.0000 mg | ORAL_TABLET | ORAL | 0 refills | Status: DC

## 2023-06-17 MED ORDER — IVABRADINE HCL 7.5 MG PO TABS
15.0000 mg | ORAL_TABLET | ORAL | 0 refills | Status: DC
  Filled 2023-06-17: qty 2, 1d supply, fill #0

## 2023-06-17 MED ORDER — ASPIRIN 81 MG PO TBEC: 81.0000 mg | DELAYED_RELEASE_TABLET | Freq: Every day | ORAL | Status: AC

## 2023-06-17 MED ORDER — METOPROLOL TARTRATE 25 MG PO TABS: 25.0000 mg | ORAL_TABLET | ORAL | 0 refills | Status: DC

## 2023-06-17 NOTE — Addendum Note (Signed)
 Addended by: Lonia Farber on: 06/17/2023 11:46 AM   Modules accepted: Orders

## 2023-06-17 NOTE — Progress Notes (Signed)
 Cardiology Consultation:    Date:  06/17/2023   ID:  [name], DOB 1971-03-16, MRN [id number]  PCP:  Dorina Loving, PA-C  Cardiologist:  Lamar Fitch, MD   Referring MD: Dorina Loving RIGGERS   Chief Complaint  Patient presents with   Chest Pain    History of Present Illness:    Lorraine Conrad is a 53 y.o. female who is being seen today for the evaluation of chest pain at the request of [place], Loving, [place].  Past medical history significant for dyslipidemia, recurrent chest pain.  Obstructive sleep apnea on CPAP mask for many years.  Obesity.  She was referred to us  because of chest pain.  Looking at her chart it seems to me that she has been experiencing chest pain for a long time she had multiple test done so far.  In 2017 she did have a calcium  score done which was 0, couple years later she had stress test done which was negative.  This time she presented to the emergency with chest pain.  She describes the pain as tightness pressure in the retrosternal area.  She cannot tell me exactly how long it last she said it can last for days but then she has had on and off.  There is no sweating associated with this sensation there is no relieving no aggravating factors, there is some shortness of breath associated with that.  Biochemical markers were negative in the emergency room she was discharged home with instruction to follow-up with us .  She admits that she does not exercise on a regular basis.  She does have family history of premature coronary disease.  Apparently her father got bypass surgery done in his late 58s.  She does not smoke, does not exercise on the regular basis she understand it is a problem and she had to go back to exercises.  Past Medical History:  Diagnosis Date   Allergy    SEASONAL   Anemia    Arthritis    spine (03/01/2014)   Asthma    Chronic lower back pain    Endometriosis    Erosive gastritis    External hemorrhoids    GERD (gastroesophageal  reflux disease)    H/O shortness of breath 2014   Cardiopulmonary exercise test results   Headache    probably monthly (03/01/2014)   Migraine 1998; 02/2014   this one's lasted 10 days straight (03/01/2014)   OSA on CPAP    Ovarian cyst, left    Proctalgia fugax    Sleep apnea    on cpap   Thyroid  disease    thyroid  nodule    Past Surgical History:  Procedure Laterality Date   ABDOMINAL HYSTERECTOMY  04/2013   BREAST BIOPSY Bilateral 10/05/2000   BREAST BIOPSY Right 08/26/2019   Fibroadenoma   BREAST EXCISIONAL BIOPSY Left 1991   CESAREAN SECTION  2002; 2006   COLONOSCOPY     LAPAROSCOPIC OVARIAN CYSTECTOMY  ~ 1999   NASAL SEPTUM SURGERY  ~ 1991   OOPHORECTOMY Right 2015   TEMPOROMANDIBULAR JOINT SURGERY  ~ 1992   UPPER GASTROINTESTINAL ENDOSCOPY      Current Medications: Current Meds  Medication Sig   acetaminophen  (TYLENOL ) 500 MG tablet Take 500-1,000 mg by mouth every 6 (six) hours as needed for moderate pain (pain score 4-6).   albuterol  (VENTOLIN  HFA) 108 (90 Base) MCG/ACT inhaler Inhale 2 puffs into the lungs every 6 (six) hours as needed for wheezing or shortness of breath.  atorvastatin  (LIPITOR) 10 MG tablet TAKE 1 TABLET BY MOUTH EVERY DAY   Azelastine  HCl 137 MCG/SPRAY SOLN PLACE 1 SPRAY INTO BOTH NOSTRILS 2 TIMES DAILY AS DIRECTED (Patient taking differently: Place 1 spray into both nostrils 2 (two) times daily as needed (Rhinitis).)   budesonide -formoterol  (SYMBICORT ) 160-4.5 MCG/ACT inhaler Inhale 2 puffs into the lungs 2 (two) times daily. (Patient taking differently: Inhale 2 puffs into the lungs 2 (two) times daily as needed (sob).)   busPIRone  (BUSPAR ) 7.5 MG tablet Take 1 tablet (7.5 mg total) by mouth 2 (two) times daily.   celecoxib  (CELEBREX ) 200 MG capsule Take 1 capsule (200 mg total) by mouth daily. (Patient taking differently: Take 200 mg by mouth 2 (two) times daily as needed for moderate pain (pain score 4-6).)   ciclopirox  (PENLAC ) 8 %  solution Apply topically at bedtime. Apply over nail and surrounding skin. Apply daily over previous coat. After seven (7) days, may remove with alcohol and continue cycle. (Patient taking differently: Apply 1 Application topically at bedtime as needed (pain). Apply over nail and surrounding skin. Apply daily over previous coat. After seven (7) days, may remove with alcohol and continue cycle.)   fluticasone  (FLONASE ) 50 MCG/ACT nasal spray SPRAY 2 SPRAYS INTO EACH NOSTRIL EVERY DAY (Patient taking differently: Place 2 sprays into both nostrils daily as needed for allergies or rhinitis.)   lansoprazole (PREVACID) 30 MG capsule Take 30 mg by mouth daily as needed (Indigestion).   levocetirizine (XYZAL ) 5 MG tablet Take 5 mg by mouth daily as needed for allergies.   vitamin B-12 (CYANOCOBALAMIN ) 100 MCG tablet Take 100 mcg by mouth daily.   [DISCONTINUED] benzonatate  (TESSALON ) 100 MG capsule Take 1 capsule (100 mg total) by mouth 3 (three) times daily as needed for cough.   [DISCONTINUED] clonazePAM  (KLONOPIN ) 0.5 MG tablet 1 tab po bid prn anxiety (Patient taking differently: Take 0.5 mg by mouth as needed for anxiety. 1 tab po bid prn anxiety)   [DISCONTINUED] doxycycline  (VIBRA -TABS) 100 MG tablet Take 1 tablet (100 mg total) by mouth 2 (two) times daily.   [DISCONTINUED] fluticasone  (FLONASE ) 50 MCG/ACT nasal spray Place 2 sprays into both nostrils daily.   [DISCONTINUED] methylPREDNISolone  (MEDROL ) 4 MG tablet Standard 6 day taper (Patient taking differently: Take 4 mg by mouth as directed. Standard 6 day taper)   [DISCONTINUED] predniSONE  (STERAPRED UNI-PAK 21 TAB) 10 MG (21) TBPK tablet Take 10 mg by mouth daily.   [DISCONTINUED] sertraline  (ZOLOFT ) 25 MG tablet Take 1 tablet (25 mg total) by mouth daily.   [DISCONTINUED] terbinafine  (LAMISIL ) 250 MG tablet Take 250 mg by mouth daily as needed (Fungus).     Allergies:   Promethazine hcl, Cephalexin , Codeine, Dilaudid [hydromorphone], Soy allergy  (do not select), and Azithromycin   Social History   Socioeconomic History   Marital status: Married    Spouse name: Not on file   Number of children: 2   Years of education: Not on file   Highest education level: Not on file  Occupational History   Occupation: homemaker  Tobacco Use   Smoking status: Never   Smokeless tobacco: Never   Tobacco comments:    father smoked in house growing up per pt.   Vaping Use   Vaping status: Never Used  Substance and Sexual Activity   Alcohol use: Yes    Alcohol/week: 0.0 standard drinks of alcohol    Comment: 12-23-16 I'll have 1-2 drinks maybe 3-4 times/yr   Drug use: No   Sexual  activity: Yes    Birth control/protection: Surgical  Other Topics Concern   Not on file  Social History Narrative   Daily caffiene use: 6 cups per day   Patient does not get regular excercise   Social Drivers of Health   Financial Resource Strain: Low Risk  (09/23/2022)   Received from [facility], [facility]   Overall Financial Resource Strain (CARDIA)    Difficulty of Paying Living Expenses: Not hard at all  Food Insecurity: No Food Insecurity (09/23/2022)   Received from [facility], [facility]   Hunger Vital Sign    Worried About Running Out of Food in the Last Year: Never true    Ran Out of Food in the Last Year: Never true  Transportation Needs: No Transportation Needs (09/23/2022)   Received from [facility], [facility]   PRAPARE - Transportation    Lack of Transportation (Medical): No    Lack of Transportation (Non-Medical): No  Physical Activity: Not on file  Stress: Not on file  Social Connections: Unknown (10/18/2021)   Received from [facility], [facility]   Social Network    Social Network: Not on file     Family History: The patient's family history includes Breast cancer in her paternal grandmother; Colon polyps in her father, paternal grandfather, and paternal grandmother; Diverticulitis in her mother; Heart  disease in her father; Prostate cancer in her paternal grandfather. There is no history of Colon cancer, Esophageal cancer, Rectal cancer, Stomach cancer, or Pancreatic cancer. ROS:   Please see the history of present illness.    All 14 point review of systems negative except as described per history of present illness.  EKGs/Labs/Other Studies Reviewed:    The following studies were reviewed today:   EKG:  EKG Interpretation Date/Time:  Wednesday June 17 2023 10:33:32 EST Ventricular Rate:  109 PR Interval:  114 QRS Duration:  86 QT Interval:  320 QTC Calculation: 430 R Axis:   44  Text Interpretation: Sinus tachycardia Otherwise normal ECG When compared with ECG of 21-May-2023 07:04, PREVIOUS ECG IS PRESENT Confirmed by Bernie Charleston [phone]) on 06/17/2023 10:47:49 AM    Recent Labs: 10/14/2022: ALT 12 05/21/2023: BUN 9; Creatinine, Ser 0.92; Hemoglobin 13.2; Platelets 424; Potassium 3.9; Sodium 137  Recent Lipid Panel    Component Value Date/Time   CHOL 164 10/14/2022 0958   TRIG 110.0 10/14/2022 0958   HDL 46.10 10/14/2022 0958   CHOLHDL 4 10/14/2022 0958   VLDL 22.0 10/14/2022 0958   LDLCALC 96 10/14/2022 0958    Physical Exam:    VS:  BP 110/78 (BP Location: Right Arm, Patient Position: Sitting, Cuff Size: Large)   Pulse (!) 109   Ht 5' 4 (1.626 m)   Wt 227 lb 3.2 oz (103.1 kg)   LMP 02/08/2013   SpO2 95%   BMI 39.00 kg/m     Wt Readings from Last 3 Encounters:  06/17/23 227 lb 3.2 oz (103.1 kg)  06/04/23 224 lb (101.6 kg)  05/29/23 224 lb (101.6 kg)     GEN:  Well nourished, well developed in no acute distress HEENT: Normal NECK: No JVD; No carotid bruits LYMPHATICS: No lymphadenopathy CARDIAC: RRR, no murmurs, no rubs, no gallops RESPIRATORY:  Clear to auscultation without rales, wheezing or rhonchi  ABDOMEN: Soft, non-tender, non-distended MUSCULOSKELETAL:  No edema; No deformity  SKIN: Warm and dry NEUROLOGIC:  Alert and oriented x  3 PSYCHIATRIC:  Normal affect   ASSESSMENT:    1. Chest pain,  unspecified type   2. Diastolic dysfunction   3. OSA (obstructive sleep apnea)   4. Obesity (BMI 35.0-39.9 without comorbidity)   5. Mixed hyperlipidemia    PLAN:    In order of problems listed above:  Atypical chest pain.  On the physical exam this will be tenderness in epigastrium this will be tenderness in the right upper quadrant does also some tenderness around around the area of left of her sternum.  However she said this is a different pain compared to pain that brought her to the emergency room.  I asked her to start taking 1 baby aspirin  every single day we talked in length about what will be the best test to look at potential coronary disease and we decided to proceed with coronary CT angio.  I explained procedure to hear her risk benefits as well as alternatives she is willing to proceed. Dyspnea exertion/diastolic dysfunction normal from before.  Will schedule her to have echocardiogram to assess left ventricle ejection fraction that will be also beneficial to look at the size and function of the right ventricle. Obstructive sleep apnea on CPAP mask. Dyslipidemia I did review K PN which show me her cholesterol LDL 96 HDL 46 this is from 01/14/2023.  She is taking Lipitor 10.  I did look up previous cholesterol results and her cholesterol is elevated with LDL more than 150.  Clearly she need to be on cholesterol medication what to target LDL will be depends on coronary CT angio.   Medication Adjustments/Labs and Tests Ordered: Current medicines are reviewed at length with the patient today.  Concerns regarding medicines are outlined above.  Orders Placed This Encounter  Procedures   EKG 12-Lead   No orders of the defined types were placed in this encounter.   Signed, Lamar DOROTHA Fitch, MD, [facility] 06/17/2023 11:16 AM    [facility]

## 2023-06-17 NOTE — Addendum Note (Signed)
 Addended by: Lonia Farber on: 06/17/2023 11:38 AM   Modules accepted: Orders

## 2023-06-17 NOTE — Patient Instructions (Signed)
 Medication Instructions:    2 hours before your CT take: metoprolol  25 mg  Coronevol 15 mg    Start taking Aspirin  81 mg once a day  *If you need a refill on your cardiac medications before your next appointment, please call your pharmacy*   Lab Work: None ordered  If you have labs (blood work) drawn today and your tests are completely normal, you will receive your results only by: MyChart Message (if you have MyChart) OR A paper copy in the mail If you have any lab test that is abnormal or we need to change your treatment, we will call you to review the results.   Testing/Procedures:   Your cardiac CT will be scheduled at one of the below locations:   [facility]  [address], [place], [place] [address]    Please follow these instructions carefully (unless otherwise directed):  Hold all erectile dysfunction medications at least 3 days (72 hrs) prior to test.  On the Night Before the Test: Be sure to Drink plenty of water. Do not consume any caffeinated/decaffeinated beverages or chocolate 12 hours prior to your test. Do not take any antihistamines 12 hours prior to your test.   On the Day of the Test: Drink plenty of water until 1 hour prior to the test. Do not eat any food 4 hours prior to the test. You may take your regular medications prior to the test.  Take metoprolol  (Lopressor ) two hours prior to test.  FEMALES- please wear underwire-free bra if available, avoid dresses & tight clothing         After the Test: Drink plenty of water. After receiving IV contrast, you may experience a mild flushed feeling. This is normal. On occasion, you may experience a mild rash up to 24 hours after the test. This is not dangerous. If this occurs, you can take Benadryl  25 mg and increase your fluid intake. If you experience trouble breathing, this can be serious. If it is severe call 911 IMMEDIATELY. If it is mild, please call our office. If  you take any of these medications: Glipizide/Metformin, Avandament, Glucavance, please do not take 48 hours after completing test unless otherwise instructed.  We will call to schedule your test 2-4 weeks out understanding that some insurance companies will need an authorization prior to the service being performed.   For non-scheduling related questions, please contact the cardiac imaging nurse navigator should you have any questions/concerns: Camie Shutter, Cardiac Imaging Nurse Navigator Chantal Requena, Cardiac Imaging Nurse Navigator [facility] Direct Office Dial: [phone]   For scheduling needs, including cancellations and rescheduling, please call Brittany, [phone].    Follow-Up: At [facility], you and your health needs are our priority.  As part of our continuing mission to provide you with exceptional heart care, we have created designated Provider Care Teams.  These Care Teams include your primary Cardiologist (physician) and Advanced Practice Providers (APPs -  Physician Assistants and Nurse Practitioners) who all work together to provide you with the care you need, when you need it.  We recommend signing up for the patient portal called MyChart.  Sign up information is provided on this After Visit Summary.  MyChart is used to connect with patients for Virtual Visits (Telemedicine).  Patients are able to view lab/test results, encounter notes, upcoming appointments, etc.  Non-urgent messages can be sent to your provider as well.   To learn more about what you can  do with MyChart, go to [email or web address].    Your next appointment:   2 month follow up    Other Instructions Cardiac CT Angiogram A cardiac CT angiogram is a procedure to look at the heart and the area around the heart. It may be done to help find the cause of chest pains or other symptoms of heart disease. During this procedure, a substance called contrast dye is injected  into the blood vessels in the area to be checked. A large X-ray machine, called a CT scanner, then takes detailed pictures of the heart and the surrounding area. The procedure is also sometimes called a coronary CT angiogram, coronary artery scanning, or CTA. A cardiac CT angiogram allows the health care provider to see how well blood is flowing to and from the heart. The health care provider will be able to see if there are any problems, such as: Blockage or narrowing of the coronary arteries in the heart. Fluid around the heart. Signs of weakness or disease in the muscles, valves, and tissues of the heart. Tell a health care provider about: Any allergies you have. This is especially important if you have had a previous allergic reaction to contrast dye. All medicines you are taking, including vitamins, herbs, eye drops, creams, and over-the-counter medicines. Any blood disorders you have. Any surgeries you have had. Any medical conditions you have. Whether you are pregnant or may be pregnant. Any anxiety disorders, chronic pain, or other conditions you have that may increase your stress or prevent you from lying still. What are the risks? Generally, this is a safe procedure. However, problems may occur, including: Bleeding. Infection. Allergic reactions to medicines or dyes. Damage to other structures or organs. Kidney damage from the contrast dye that is used. Increased risk of cancer from radiation exposure. This risk is low. Talk with your health care provider about: The risks and benefits of testing. How you can receive the lowest dose of radiation. What happens before the procedure? Wear comfortable clothing and remove any jewelry, glasses, dentures, and hearing aids. Follow instructions from your health care provider about eating and drinking. This may include: For 12 hours before the procedure -- avoid caffeine . This includes tea, coffee, soda, energy drinks, and diet pills. Drink  plenty of water or other fluids that do not have caffeine  in them. Being well hydrated can prevent complications. For 4-6 hours before the procedure -- stop eating and drinking. The contrast dye can cause nausea, but this is less likely if your stomach is empty. Ask your health care provider about changing or stopping your regular medicines. This is especially important if you are taking diabetes medicines, blood thinners, or medicines to treat problems with erections (erectile dysfunction). What happens during the procedure?  Hair on your chest may need to be removed so that small sticky patches called electrodes can be placed on your chest. These will transmit information that helps to monitor your heart during the procedure. An IV will be inserted into one of your veins. You might be given a medicine to control your heart rate during the procedure. This will help to ensure that good images are obtained. You will be asked to lie on an exam table. This table will slide in and out of the CT machine during the procedure. Contrast dye will be injected into the IV. You might feel warm, or you may get a metallic taste in your mouth. You will be given a medicine called nitroglycerin . This will relax or  dilate the arteries in your heart. The table that you are lying on will move into the CT machine tunnel for the scan. The person running the machine will give you instructions while the scans are being done. You may be asked to: Keep your arms above your head. Hold your breath. Stay very still, even if the table is moving. When the scanning is complete, you will be moved out of the machine. The IV will be removed. The procedure may vary among health care providers and hospitals. What can I expect after the procedure? After your procedure, it is common to have: A metallic taste in your mouth from the contrast dye. A feeling of warmth. A headache from the nitroglycerin . Follow these instructions at  home: Take over-the-counter and prescription medicines only as told by your health care provider. If you are told, drink enough fluid to keep your urine pale yellow. This will help to flush the contrast dye out of your body. Most people can return to their normal activities right after the procedure. Ask your health care provider what activities are safe for you. It is up to you to get the results of your procedure. Ask your health care provider, or the department that is doing the procedure, when your results will be ready. Keep all follow-up visits as told by your health care provider. This is important. Contact a health care provider if: You have any symptoms of allergy to the contrast dye. These include: Shortness of breath. Rash or hives. A racing heartbeat. Summary A cardiac CT angiogram is a procedure to look at the heart and the area around the heart. It may be done to help find the cause of chest pains or other symptoms of heart disease. During this procedure, a large X-ray machine, called a CT scanner, takes detailed pictures of the heart and the surrounding area after a contrast dye has been injected into blood vessels in the area. Ask your health care provider about changing or stopping your regular medicines before the procedure. This is especially important if you are taking diabetes medicines, blood thinners, or medicines to treat erectile dysfunction. If you are told, drink enough fluid to keep your urine pale yellow. This will help to flush the contrast dye out of your body. This information is not intended to replace advice given to you by your health care provider. Make sure you discuss any questions you have with your health care provider. Document Revised: 01/19/2019 Document Reviewed: 01/19/2019 Elsevier Patient Education  2020 [employer].

## 2023-06-19 ENCOUNTER — Other Ambulatory Visit: Payer: Self-pay | Admitting: Medical

## 2023-06-22 DIAGNOSIS — M25562 Pain in left knee: Secondary | ICD-10-CM | POA: Diagnosis not present

## 2023-06-22 DIAGNOSIS — M6281 Muscle weakness (generalized): Secondary | ICD-10-CM | POA: Diagnosis not present

## 2023-06-22 DIAGNOSIS — M25511 Pain in right shoulder: Secondary | ICD-10-CM | POA: Diagnosis not present

## 2023-06-22 DIAGNOSIS — M5459 Other low back pain: Secondary | ICD-10-CM | POA: Diagnosis not present

## 2023-06-23 ENCOUNTER — Ambulatory Visit (HOSPITAL_COMMUNITY): Payer: BC Managed Care – PPO | Attending: Cardiology

## 2023-06-23 DIAGNOSIS — I5189 Other ill-defined heart diseases: Secondary | ICD-10-CM | POA: Insufficient documentation

## 2023-06-23 DIAGNOSIS — R079 Chest pain, unspecified: Secondary | ICD-10-CM | POA: Insufficient documentation

## 2023-06-23 LAB — ECHOCARDIOGRAM COMPLETE
Area-P 1/2: 3.46 cm2
S' Lateral: 3.1 cm

## 2023-06-30 ENCOUNTER — Other Ambulatory Visit (HOSPITAL_BASED_OUTPATIENT_CLINIC_OR_DEPARTMENT_OTHER): Payer: Self-pay

## 2023-07-03 ENCOUNTER — Telehealth: Payer: Self-pay

## 2023-07-03 ENCOUNTER — Ambulatory Visit (INDEPENDENT_AMBULATORY_CARE_PROVIDER_SITE_OTHER): Payer: BC Managed Care – PPO | Admitting: Podiatrist

## 2023-07-03 DIAGNOSIS — B351 Tinea unguium: Secondary | ICD-10-CM

## 2023-07-03 NOTE — Telephone Encounter (Signed)
Patient notified through my chart.

## 2023-07-03 NOTE — Telephone Encounter (Signed)
-----   Message from Gypsy Balsam sent at 06/25/2023 11:57 AM EST ----- Echocardiogram showed preserved left ventricular ejection fraction, overall looks good

## 2023-07-03 NOTE — Progress Notes (Signed)
Patient presents today for the 8th laser treatment. Diagnosed with mycotic nail infection by Dr. Ralene Cork.   At her last visit with Dr. Ralene Cork it was recommended she complete 3 more laser treatments and then come back to see her after the 9th laser.    Toenail most affected bilateral 1st and 5th toenails, right 3rd toenail.  All other systems are negative.  Nails were filed thin. Laser therapy was administered to bilateral toenails 1-5 and patient tolerated the treatment well. All safety precautions were in place.   Single laser pass was done on non-affected nails.  Patient reported continued use of penlac  She will return in 6-8 weeks for her 9th laser treatment-  after the next visit she will reappoint to see Dr. Ralene Cork

## 2023-07-06 ENCOUNTER — Telehealth (HOSPITAL_COMMUNITY): Payer: Self-pay | Admitting: Emergency Medicine

## 2023-07-06 ENCOUNTER — Ambulatory Visit (INDEPENDENT_AMBULATORY_CARE_PROVIDER_SITE_OTHER): Payer: BC Managed Care – PPO | Admitting: Podiatry

## 2023-07-06 ENCOUNTER — Encounter (HOSPITAL_COMMUNITY): Payer: Self-pay

## 2023-07-06 ENCOUNTER — Encounter: Payer: Self-pay | Admitting: Podiatry

## 2023-07-06 DIAGNOSIS — B351 Tinea unguium: Secondary | ICD-10-CM | POA: Diagnosis not present

## 2023-07-06 MED ORDER — TERBINAFINE HCL 250 MG PO TABS: 250.0000 mg | ORAL_TABLET | Freq: Every day | ORAL | 2 refills | Status: AC

## 2023-07-06 NOTE — Progress Notes (Signed)
  Subjective:  Patient ID: Lorraine Conrad, female    DOB: 1970/10/29,   MRN: [id number]  No chief complaint on file.   53 y.o. female presents for follow-up of fungal nail treatments with laser. Relates they are doing about the same and now third toe on right starting to get affected. Relates she may need to consider lamisil this time.   Denies any other pedal complaints. Denies n/v/f/c.   Past Medical History:  Diagnosis Date   Allergy    SEASONAL   Anemia    Arthritis    "spine" (03/01/2014)   Asthma    Chronic lower back pain    Endometriosis    Erosive gastritis    External hemorrhoids    GERD (gastroesophageal reflux disease)    H/O shortness of breath 2014   Cardiopulmonary exercise test results   Headache    "probably monthly" (03/01/2014)   Migraine 1998; 02/2014   "this one's lasted 10 days straight" (03/01/2014)   OSA on CPAP    Ovarian cyst, left    Proctalgia fugax    Sleep apnea    on cpap   Thyroid disease    thyroid nodule    Objective:  Physical Exam: Vascular: DP/PT pulses 2/4 bilateral. CFT <3 seconds. Normal hair growth on digits. No edema.  Skin. No lacerations or abrasions bilateral feet. Bilateral hallux and fifth digit nails discolored and thickened with subungual debris. There is some clearing noted in the proximal 1/2 of the hallux nails bilateral noted. Some changes noted now to right third digit  Musculoskeletal: MMT 5/5 bilateral lower extremities in DF, PF, Inversion and Eversion. Deceased ROM in DF of ankle joint.  Neurological: Sensation intact to light touch.   Assessment:   1. Onychomycosis        Plan:  Patient was evaluated and treated and all questions answered. -Examined patient -Discussed treatment options for painful dystrophic nails  -Previous cultures were positive for fungus.  -Discussed fungal nail treatment options including oral, topical, and laser treatments.  Will hold off on laser treatments and start lamisil  LFTS  reviewed and WNL.  Lamisil sent to pharmacy -Patient to return in 3 months for recheck.    Louann Sjogren, DPM

## 2023-07-06 NOTE — Telephone Encounter (Signed)
Reaching out to patient to offer assistance regarding upcoming cardiac imaging study; pt verbalizes understanding of appt date/time, parking situation and where to check in, pre-test NPO status and medications ordered, and verified current allergies; name and call back number provided for further questions should they arise Rockwell Alexandria RN Navigator Cardiac Imaging [facility] [phone] office [phone] cell  15mg  ivab + 25mg  metop

## 2023-07-07 ENCOUNTER — Ambulatory Visit (HOSPITAL_BASED_OUTPATIENT_CLINIC_OR_DEPARTMENT_OTHER)
Admission: RE | Admit: 2023-07-07 | Discharge: 2023-07-07 | Disposition: A | Discharge status: Home / Self Care | Payer: BC Managed Care – PPO | Source: Ambulatory Visit | Attending: Cardiology | Admitting: Cardiology

## 2023-07-07 ENCOUNTER — Encounter (HOSPITAL_BASED_OUTPATIENT_CLINIC_OR_DEPARTMENT_OTHER): Payer: Self-pay

## 2023-07-07 DIAGNOSIS — R079 Chest pain, unspecified: Secondary | ICD-10-CM | POA: Diagnosis not present

## 2023-07-07 MED ORDER — IOHEXOL 350 MG/ML SOLN
100.0000 mL | Freq: Once | INTRAVENOUS | Status: AC | PRN
  Administered 2023-07-07: 95 mL via INTRAVENOUS

## 2023-07-07 MED ORDER — NITROGLYCERIN 0.4 MG SL SUBL
0.8000 mg | SUBLINGUAL_TABLET | Freq: Once | SUBLINGUAL | Status: AC
  Administered 2023-07-07: 0.8 mg via SUBLINGUAL

## 2023-07-07 NOTE — Progress Notes (Signed)
Pt tolerated exam without incident; vital signs within normal limits; pt denies lightheadedness or dizziness; encouraged to drink caffeine, eat meal; pt ambulatory to lobby steady gait noted

## 2023-07-15 ENCOUNTER — Telehealth: Payer: Self-pay

## 2023-07-15 NOTE — Telephone Encounter (Signed)
 Patient notified through my chart.

## 2023-07-15 NOTE — Telephone Encounter (Signed)
-----   Message from Ralene Burger sent at 07/08/2023 10:03 AM EST ----- Coronary CT angio showed mild nonobstructive disease

## 2023-07-22 ENCOUNTER — Encounter: Payer: Self-pay | Admitting: Internal Medicine

## 2023-07-22 ENCOUNTER — Other Ambulatory Visit (INDEPENDENT_AMBULATORY_CARE_PROVIDER_SITE_OTHER): Payer: BC Managed Care – PPO

## 2023-07-22 ENCOUNTER — Ambulatory Visit: Payer: BC Managed Care – PPO | Admitting: Internal Medicine

## 2023-07-22 VITALS — BP 122/78 | HR 95 | Ht 64.0 in | Wt 233.2 lb

## 2023-07-22 DIAGNOSIS — R140 Abdominal distension (gaseous): Secondary | ICD-10-CM

## 2023-07-22 DIAGNOSIS — R195 Other fecal abnormalities: Secondary | ICD-10-CM

## 2023-07-22 DIAGNOSIS — R194 Change in bowel habit: Secondary | ICD-10-CM

## 2023-07-22 DIAGNOSIS — R1310 Dysphagia, unspecified: Secondary | ICD-10-CM

## 2023-07-22 DIAGNOSIS — K625 Hemorrhage of anus and rectum: Secondary | ICD-10-CM

## 2023-07-22 DIAGNOSIS — R1011 Right upper quadrant pain: Secondary | ICD-10-CM

## 2023-07-22 LAB — HEPATIC FUNCTION PANEL
ALT: 17 U/L (ref 0–35)
AST: 15 U/L (ref 0–37)
Albumin: 4.2 g/dL (ref 3.5–5.2)
Alkaline Phosphatase: 86 U/L (ref 39–117)
Bilirubin, Direct: 0 mg/dL (ref 0.0–0.3)
Total Bilirubin: 0.3 mg/dL (ref 0.2–1.2)
Total Protein: 7.5 g/dL (ref 6.0–8.3)

## 2023-07-22 LAB — C-REACTIVE PROTEIN: CRP: <1 mg/dL (ref 0.5–20.0)

## 2023-07-22 LAB — LIPASE: Lipase: 23 U/L (ref 11.0–59.0)

## 2023-07-22 LAB — TSH: TSH: 2.3 u[IU]/mL (ref 0.35–5.50)

## 2023-07-22 MED ORDER — SUFLAVE 178.7 G PO SOLR: 1.0000 | Freq: Once | ORAL | 0 refills | Status: AC

## 2023-07-22 NOTE — Progress Notes (Unsigned)
Chief Complaint: Change in bowel habits  HPI : 53 year old female with history of arthritis, asthma, endometriosis, migraine, GERD, and OSA on CPAP presents with change in bowel habits  Her stools have come more thin for a couple of months. She can see a little of blood when she wipes on occasion. She doesn't feel like she fully evacuates her stools. Denies weight loss. Endorses feeling bloated. On average she has 1-2 BMs once a day with a small amount of stool output. She did have some pain in her right side all last week. Denies N&V. She does have acid reflux on occasion that is not that bothersome. Sometimes she will get choked will see stars. Her previous  esophageal dilation helped with her dysphagia for a long time. Mother had diverticulitis. Dad and grandmother had colon polyps.   Past Medical History:  Diagnosis Date   Allergy    SEASONAL   Anemia    Arthritis    "spine" (03/01/2014)   Asthma    Chronic lower back pain    Endometriosis    Erosive gastritis    External hemorrhoids    GERD (gastroesophageal reflux disease)    H/O shortness of breath 2014   Cardiopulmonary exercise test results   Headache    "probably monthly" (03/01/2014)   Migraine 1998; 02/2014   "this one's lasted 10 days straight" (03/01/2014)   OSA on CPAP    Ovarian cyst, left    Proctalgia fugax    Sleep apnea    on cpap   Thyroid disease    thyroid nodule     Past Surgical History:  Procedure Laterality Date   ABDOMINAL HYSTERECTOMY  04/2013   BREAST BIOPSY Bilateral 10/05/2000   BREAST BIOPSY Right 08/26/2019   Fibroadenoma   BREAST EXCISIONAL BIOPSY Left 1991   CESAREAN SECTION  2002; 2006   COLONOSCOPY     LAPAROSCOPIC OVARIAN CYSTECTOMY  ~ 1999   NASAL SEPTUM SURGERY  ~ 1991   OOPHORECTOMY Right 2015   TEMPOROMANDIBULAR JOINT SURGERY  ~ 1992   UPPER GASTROINTESTINAL ENDOSCOPY     Family History  Problem Relation Age of Onset   Diverticulitis Mother    Colon polyps Father     Heart disease Father    Colon polyps Paternal Grandmother    Breast cancer Paternal Grandmother    Prostate cancer Paternal Grandfather    Colon polyps Paternal Grandfather    Colon cancer Neg Hx    Esophageal cancer Neg Hx    Rectal cancer Neg Hx    Stomach cancer Neg Hx    Pancreatic cancer Neg Hx    Social History   Tobacco Use   Smoking status: Never   Smokeless tobacco: Never   Tobacco comments:    father smoked in house growing up per pt.   Vaping Use   Vaping status: Never Used  Substance Use Topics   Alcohol use: Yes    Alcohol/week: 0.0 standard drinks of alcohol    Comment: 12-23-16 "I'll have 1-2 drinks maybe 3-4 times/yr"   Drug use: No   Current Outpatient Medications  Medication Sig Dispense Refill   acetaminophen (TYLENOL) 500 MG tablet Take 500-1,000 mg by mouth every 6 (six) hours as needed for moderate pain (pain score 4-6).     albuterol (VENTOLIN HFA) 108 (90 Base) MCG/ACT inhaler Inhale 2 puffs into the lungs every 6 (six) hours as needed for wheezing or shortness of breath. 8 g 2   aspirin EC  81 MG tablet Take 1 tablet (81 mg total) by mouth daily. Swallow whole.     atorvastatin (LIPITOR) 10 MG tablet TAKE 1 TABLET BY MOUTH EVERY DAY 90 tablet 2   Azelastine HCl 137 MCG/SPRAY SOLN PLACE 1 SPRAY INTO BOTH NOSTRILS 2 TIMES DAILY AS DIRECTED (Patient taking differently: Place 1 spray into both nostrils 2 (two) times daily as needed (Rhinitis).) 30 mL 2   budesonide-formoterol (SYMBICORT) 160-4.5 MCG/ACT inhaler Inhale 2 puffs into the lungs 2 (two) times daily. (Patient taking differently: Inhale 2 puffs into the lungs 2 (two) times daily as needed (sob).) 1 each 12   busPIRone (BUSPAR) 7.5 MG tablet Take 1 tablet (7.5 mg total) by mouth 2 (two) times daily. 180 tablet 0   celecoxib (CELEBREX) 200 MG capsule Take 1 capsule (200 mg total) by mouth daily. (Patient taking differently: Take 200 mg by mouth 2 (two) times daily as needed for moderate pain (pain score  4-6).) 20 capsule 0   ciclopirox (PENLAC) 8 % solution Apply topically at bedtime. Apply over nail and surrounding skin. Apply daily over previous coat. After seven (7) days, may remove with alcohol and continue cycle. (Patient taking differently: Apply 1 Application topically at bedtime as needed (pain). Apply over nail and surrounding skin. Apply daily over previous coat. After seven (7) days, may remove with alcohol and continue cycle.) 6.6 mL 0   fluticasone (FLONASE) 50 MCG/ACT nasal spray SPRAY 2 SPRAYS INTO EACH NOSTRIL EVERY DAY (Patient taking differently: Place 2 sprays into both nostrils daily as needed for allergies or rhinitis.) 16 mL 5   ivabradine (CORLANOR) 7.5 MG TABS tablet Take 2 tablets (15 mg total) by mouth as directed. Take 2 hours before your CT 2 tablet 0   lansoprazole (PREVACID) 30 MG capsule Take 30 mg by mouth daily as needed (Indigestion).     levocetirizine (XYZAL) 5 MG tablet Take 5 mg by mouth daily as needed for allergies.     metoprolol tartrate (LOPRESSOR) 25 MG tablet Take 1 tablet (25 mg total) by mouth as directed. Take two hours before you CT 1 tablet 0   terbinafine (LAMISIL) 250 MG tablet Take 1 tablet (250 mg total) by mouth daily. 30 tablet 2   vitamin B-12 (CYANOCOBALAMIN) 100 MCG tablet Take 100 mcg by mouth daily.     No current facility-administered medications for this visit.   Allergies  Allergen Reactions   Promethazine Hcl Other (See Comments)    Violent tremors    Cephalexin Diarrhea and Itching   Codeine Other (See Comments)    Was a child when she took this.   Dilaudid [Hydromorphone] Nausea And Vomiting   Soy Allergy (Obsolete) Itching   Azithromycin Rash    Head to toe rash     Review of Systems: All systems reviewed and negative except where noted in HPI.   Physical Exam: BP 122/78   Pulse 95   Ht 5\' 4"  (1.626 m)   Wt 233 lb 4 oz (105.8 kg)   LMP 02/08/2013   BMI 40.04 kg/m  Constitutional: Pleasant,well-developed, female  in no acute distress. HEENT: Normocephalic and atraumatic. Conjunctivae are normal. No scleral icterus. Cardiovascular: Normal rate, regular rhythm.  Pulmonary/chest: Effort normal and breath sounds normal. No wheezing, rales or rhonchi. Abdominal: Soft, nondistended, tender in the RUQ. Quiet bowel sounds. There are no masses palpable. No hepatomegaly. Extremities: No edema Neurological: Alert and oriented to person place and time. Skin: Skin is warm and dry. No rashes noted.  Psychiatric: Normal mood and affect. Behavior is normal.  Labs 05/2023: CBC nml. BMP unremarkable.  CT A/P w/contrast 05/20/18: IMPRESSION: 1. No bowel obstruction. No evident diverticulitis. No abscess in the abdomen or pelvis. Appendix appears normal. 2.  No evident renal or ureteral calculus.  No hydronephrosis. 3.  Minimal ventral hernia containing only fat. 4.  Uterus absent. Comment: A cause for patient's symptoms has not been established with this study.  EGD 02/25/17: - No endoscopic esophageal abnormality to explain patient' s dysphagia. Esophagus appeared normal. Esophagus dilated. - Small hiatal hernia, otherwise normal stomach. - Normal duodenal bulb and second portion of the duodenum. - No specimens collected.  Colonoscopy 03/25/17: - Perianal skin tag found on perianal exam. - Internal hemorrhoids. - The examination was otherwise normal on direct and retroflexion views. - No specimens collected.  ASSESSMENT AND PLAN: Thin stools Change in bowel habits Occasional rectal bleeding Bloating RUQ ab pain Dysphagia Patient describes thin stools for the last couple of months.  Endorses occasional rectal bleeding.  Patient has had some right-sided abdominal pain as well as bloating symptoms.  At this time I am suspicious about whether or not the patient may have some underlying constipation as a source of her symptoms.  Her last colonoscopy was in 2018 that was fairly unremarkable.  Due to her change in  bowel habits though, we will plan for a colonoscopy for further evaluation in order to rule out any concerning sources of her change in bowel habits.  Will also plan for further evaluation with labs and CT imaging.  Due to the patient's dysphagia and response to esophageal dilation in the past, will plan for an EGD at the same time as her colonoscopy procedure. - Check LFTs, lipase, TSH, CRP - Check CT A/P w/contrast - EGD/colonoscopy [facility]  Eulah Pont, MD  I spent 46 minutes of time, including in depth chart review, independent review of results as outlined above, communicating results with the patient directly, face-to-face time with the patient, coordinating care, ordering studies and medications as appropriate, and documentation.

## 2023-07-22 NOTE — Patient Instructions (Addendum)
Your provider has requested that you go to the basement level for lab work before leaving today. Press "B" on the elevator. The lab is located at the first door on the left as you exit the elevator.  you have been scheduled for a CT scan of the abdomen and pelvis at [facility], 1st floor Radiology. You are scheduled on 07/24/23 at 5:30pm. You should arrive at 3:15 pm  to your appointment time for registration.  We are giving you 2 bottles of contrast today that you will need to drink before arriving for the exam. The solution may taste better if refrigerated so put them in the refrigerator when you get home, but do NOT add ice or any other liquid to this solution as that would dilute it. Shake well before drinking.   Please follow the written instructions below on the day of your exam:   1) Do not eat anything after 1:30pm (4 hours prior to your test)   You may take any medications as prescribed with a small amount of water, if necessary. If you take any of the following medications: METFORMIN, GLUCOPHAGE, GLUCOVANCE, AVANDAMET, RIOMET, FORTAMET, ACTOPLUS MET, JANUMET, GLUMETZA or METAGLIP, you MAY be asked to HOLD this medication 48 hours AFTER the exam.   The purpose of you drinking the oral contrast is to aid in the visualization of your intestinal tract. The contrast solution may cause some diarrhea. Depending on your individual set of symptoms, you may also receive an intravenous injection of x-ray contrast/dye. Plan on being at [facility] for 45 minutes or longer, depending on the type of exam you are having performed.   If you have any questions regarding your exam or if you need to reschedule, you may call [facility] at [phone] between the hours of 8:00 am and 5:00 pm, Monday-Friday.   You have been scheduled for an endoscopy and colonoscopy. Please follow the written instructions given to you at your visit today.  If you use inhalers (even only as needed), please  bring them with you on the day of your procedure.  DO NOT TAKE 7 DAYS PRIOR TO TEST- Trulicity (dulaglutide) Ozempic, Wegovy (semaglutide) Mounjaro (tirzepatide) Bydureon Bcise (exanatide extended release)  DO NOT TAKE 1 DAY PRIOR TO YOUR TEST Rybelsus (semaglutide) Adlyxin (lixisenatide) Victoza (liraglutide) Byetta (exanatide) ___________________________________________________________________________  Lorraine Conrad will receive your bowel preparation through [facility], which ensures the lowest copay and home delivery, with outreach via text or call from an [phone] number. Please respond promptly to avoid rescheduling of your procedure. If you are interested in alternative options or have any questions regarding your prep, please contact them at [phone] ____________________________________________________________________________  Your Provider Has Sent Your Bowel Prep Regimen To [facility]   [facility] will contact you to verify your information and collect your copay, if applicable. Enjoy the comfort of your home while your prescription is mailed to you, FREE of any shipping charges.   [facility] accepts all major insurance benefits and applies discounts & coupons.  Have additional questions?   Chat: [email or web address] Call: [phone] Email: [email or web address] NCPDP: [id number]  How will [facility] contact you?  With a Welcome phone call,  a Welcome text and a checkout link in text form.  Texts you receive from [phone] Are NOT Spam.  *To set up delivery, you must complete the checkout process via link or speak to one of the patient care representatives. If [facility] is unable to reach you, your prescription may be delayed.  To avoid long  hold times on the phone, you may also utilize the secure chat feature on the [facility] website to request that they call you back for transaction completion or to expedite your concerns.   You will receive your bowel  preparation through [facility], which ensures the lowest copay and home delivery, with outreach via text or call from an [phone] number. Please respond promptly to avoid rescheduling. If you are interested in alternative options or have any questions please contact them at [phone]  Your Provider Has Sent Your Bowel Prep Regimen To [facility] What to expect. [facility] will contact you to verify your information and collect your copay, if applicable. Enjoy the comfort of your home while we deliver your prescription to you, free of any shipping charges. Fast, FREE delivery or shipping. [facility] accepts all major insurance benefits and applies discounts & coupons  Have additional questions? [facility]'s patient care team is always here to help.  Chat: [email or web address] Call: [phone] Email: [email or web address] NCPDP: [id number] How will we contact you? Welcome Phone call  a Welcome text and a Checkout link in a text Texts you receive from [phone] Are Not Spam.   *To set up delivery, you must complete the checkout process via link or speak to one of our patient care representatives. If we are unable to reach you, your prescription may be delayed.  To avoid waiting on hold if you call. Utilize the secure chat feature and request [facility] call you to complete the transaction or expedite your concerns.  _______________________________________________________  If your blood pressure at your visit was 140/90 or greater, please contact your primary care physician to follow up on this.  _______________________________________________________  If you are age 51 or older, your body mass index should be between 23-30. Your Body mass index is 40.04 kg/m. If this is out of the aforementioned range listed, please consider follow up with your Primary Care Provider.  If you are age 38 or younger, your body mass index should be between 19-25. Your Body mass index is 40.04 kg/m.  If this is out of the aformentioned range listed, please consider follow up with your Primary Care Provider.   ________________________________________________________  The [facility] providers would like to encourage you to use [facility] to communicate with providers for non-urgent requests or questions.  Due to long hold times on the telephone, sending your provider a message by [facility] may be a faster and more efficient way to get a response.  Please allow 48 business hours for a response.  Please remember that this is for non-urgent requests.  _______________________________________________________  Due to recent changes in healthcare laws, you may see the results of your imaging and laboratory studies on MyChart before your provider has had a chance to review them.  We understand that in some cases there may be results that are confusing or concerning to you. Not all laboratory results come back in the same time frame and the provider may be waiting for multiple results in order to interpret others.  Please give [place] 48 hours in order for your provider to thoroughly review all the results before contacting the office for clarification of your results.   Thank you for entrusting me with your care and for choosing [facility], Dr. Eulah Pont

## 2023-07-24 ENCOUNTER — Ambulatory Visit (HOSPITAL_COMMUNITY): Payer: BC Managed Care – PPO

## 2023-07-28 ENCOUNTER — Encounter (HOSPITAL_COMMUNITY): Payer: Self-pay

## 2023-07-28 ENCOUNTER — Ambulatory Visit (HOSPITAL_COMMUNITY)
Admission: RE | Admit: 2023-07-28 | Discharge: 2023-07-28 | Disposition: A | Discharge status: Home / Self Care | Payer: BC Managed Care – PPO | Source: Ambulatory Visit | Attending: Internal Medicine | Admitting: Internal Medicine

## 2023-07-28 DIAGNOSIS — R1310 Dysphagia, unspecified: Secondary | ICD-10-CM | POA: Insufficient documentation

## 2023-07-28 DIAGNOSIS — R140 Abdominal distension (gaseous): Secondary | ICD-10-CM | POA: Diagnosis not present

## 2023-07-28 DIAGNOSIS — R194 Change in bowel habit: Secondary | ICD-10-CM | POA: Insufficient documentation

## 2023-07-28 DIAGNOSIS — K8689 Other specified diseases of pancreas: Secondary | ICD-10-CM | POA: Diagnosis not present

## 2023-07-28 DIAGNOSIS — K429 Umbilical hernia without obstruction or gangrene: Secondary | ICD-10-CM | POA: Diagnosis not present

## 2023-07-28 DIAGNOSIS — R1011 Right upper quadrant pain: Secondary | ICD-10-CM | POA: Insufficient documentation

## 2023-07-28 DIAGNOSIS — K5730 Diverticulosis of large intestine without perforation or abscess without bleeding: Secondary | ICD-10-CM | POA: Diagnosis not present

## 2023-07-28 MED ORDER — IOHEXOL 300 MG/ML  SOLN
30.0000 mL | Freq: Once | INTRAMUSCULAR | Status: AC | PRN
  Administered 2023-07-28: 30 mL via ORAL

## 2023-07-28 MED ORDER — IOHEXOL 300 MG/ML  SOLN
100.0000 mL | Freq: Once | INTRAMUSCULAR | Status: AC | PRN
  Administered 2023-07-28: 100 mL via INTRAVENOUS

## 2023-08-01 ENCOUNTER — Encounter: Payer: Self-pay | Admitting: Internal Medicine

## 2023-08-17 ENCOUNTER — Ambulatory Visit: Payer: BC Managed Care – PPO | Admitting: Podiatry

## 2023-08-26 ENCOUNTER — Telehealth: Payer: Self-pay | Admitting: Internal Medicine

## 2023-08-26 NOTE — Telephone Encounter (Signed)
 PT is set to have a colonoscopy on tomorrow and has been eating  icecream bars that have pecans on the outside. She wants to know will she be able to still have procedure done. Please advise.

## 2023-08-26 NOTE — Telephone Encounter (Signed)
 Returned pt's call. Encouraged her to drink as much fluids as she can today to help the prep work the best. Instructed pt on what stools should look like after prep and to call if she does not get those results. Pt verbalized understanding.

## 2023-08-27 ENCOUNTER — Ambulatory Visit: Payer: BC Managed Care – PPO | Admitting: Internal Medicine

## 2023-08-27 ENCOUNTER — Encounter: Payer: Self-pay | Admitting: Internal Medicine

## 2023-08-27 VITALS — BP 130/82 | HR 80 | Temp 98.5°F | Resp 12 | Ht 64.0 in | Wt 233.0 lb

## 2023-08-27 DIAGNOSIS — K625 Hemorrhage of anus and rectum: Secondary | ICD-10-CM

## 2023-08-27 DIAGNOSIS — R194 Change in bowel habit: Secondary | ICD-10-CM | POA: Diagnosis not present

## 2023-08-27 DIAGNOSIS — K6389 Other specified diseases of intestine: Secondary | ICD-10-CM | POA: Diagnosis not present

## 2023-08-27 DIAGNOSIS — K2950 Unspecified chronic gastritis without bleeding: Secondary | ICD-10-CM

## 2023-08-27 DIAGNOSIS — K222 Esophageal obstruction: Secondary | ICD-10-CM | POA: Diagnosis not present

## 2023-08-27 DIAGNOSIS — K5730 Diverticulosis of large intestine without perforation or abscess without bleeding: Secondary | ICD-10-CM | POA: Diagnosis not present

## 2023-08-27 DIAGNOSIS — K648 Other hemorrhoids: Secondary | ICD-10-CM

## 2023-08-27 DIAGNOSIS — R1011 Right upper quadrant pain: Secondary | ICD-10-CM

## 2023-08-27 DIAGNOSIS — K635 Polyp of colon: Secondary | ICD-10-CM | POA: Diagnosis not present

## 2023-08-27 DIAGNOSIS — K2980 Duodenitis without bleeding: Secondary | ICD-10-CM | POA: Diagnosis not present

## 2023-08-27 DIAGNOSIS — R140 Abdominal distension (gaseous): Secondary | ICD-10-CM

## 2023-08-27 DIAGNOSIS — K2289 Other specified disease of esophagus: Secondary | ICD-10-CM

## 2023-08-27 DIAGNOSIS — K449 Diaphragmatic hernia without obstruction or gangrene: Secondary | ICD-10-CM | POA: Diagnosis not present

## 2023-08-27 DIAGNOSIS — K3189 Other diseases of stomach and duodenum: Secondary | ICD-10-CM | POA: Diagnosis not present

## 2023-08-27 DIAGNOSIS — R1310 Dysphagia, unspecified: Secondary | ICD-10-CM

## 2023-08-27 DIAGNOSIS — D125 Benign neoplasm of sigmoid colon: Secondary | ICD-10-CM

## 2023-08-27 MED ORDER — PANTOPRAZOLE SODIUM 40 MG PO TBEC: 40.0000 mg | DELAYED_RELEASE_TABLET | Freq: Every day | ORAL | 3 refills | Status: DC

## 2023-08-27 MED ORDER — SODIUM CHLORIDE 0.9 % IV SOLN: 500.0000 mL | Freq: Once | INTRAVENOUS | Status: DC

## 2023-08-27 NOTE — Progress Notes (Signed)
 Called to room to assist during endoscopic procedure.  Patient ID and intended procedure confirmed with present staff. Received instructions for my participation in the procedure from the performing physician.

## 2023-08-27 NOTE — Progress Notes (Signed)
 Report to PACU, RN, vss, BBS= Clear.

## 2023-08-27 NOTE — Op Note (Signed)
 [facility] Patient Name: Lorraine Conrad Procedure Date: 08/27/2023 1:10 PM MRN: [id number] Endoscopist: Particia Lather , , [id number] Age: 53 Referring MD:  Date of Birth: [birth year] Gender: Female Account #: [id number] Procedure:                Upper GI endoscopy Indications:              Abdominal pain in the right upper quadrant,                            Dysphagia Medicines:                Monitored Anesthesia Care Procedure:                Pre-Anesthesia Assessment:                           - Prior to the procedure, a History and Physical                            was performed, and patient medications and                            allergies were reviewed. The patient's tolerance of                            previous anesthesia was also reviewed. The risks                            and benefits of the procedure and the sedation                            options and risks were discussed with the patient.                            All questions were answered, and informed consent                            was obtained. Prior Anticoagulants: The patient has                            taken no anticoagulant or antiplatelet agents                            except for aspirin. ASA Grade Assessment: III - A                            patient with severe systemic disease. After                            reviewing the risks and benefits, the patient was                            deemed in satisfactory condition to undergo the  procedure.                           After obtaining informed consent, the endoscope was                            passed under direct vision. Throughout the                            procedure, the patient's blood pressure, pulse, and                            oxygen saturations were monitored continuously. The                            GIF [id number] #[id number] was introduced through the                             mouth, and advanced to the second part of duodenum.                            The upper GI endoscopy was accomplished without                            difficulty. The patient tolerated the procedure                            well. Scope In: Scope Out: Findings:                 A non-obstructing Schatzki ring was found at the                            gastroesophageal junction.                           Biopsies were taken with a cold forceps in the                            proximal esophagus and in the distal esophagus for                            histology.                           A guidewire was placed and the scope was withdrawn.                            Dilation was performed in the entire esophagus with                            a Savary dilator with mild resistance at 19 mm.                           A small hiatal hernia was present.  Localized mildly erythematous mucosa without                            bleeding was found in the gastric antrum. Biopsies                            were taken with a cold forceps for histology.                           Localized mild inflammation characterized by                            congestion (edema) and erythema was found in the                            duodenal bulb. Biopsies were taken with a cold                            forceps for histology. Complications:            No immediate complications. Estimated Blood Loss:     Estimated blood loss was minimal. Impression:               - Non-obstructing Schatzki ring.                           - Small hiatal hernia.                           - Erythematous mucosa in the antrum. Biopsied.                           - Duodenitis. Biopsied.                           - Biopsies were taken with a cold forceps for                            histology in the proximal esophagus and in the                            distal esophagus.                            - Dilation performed in the entire esophagus. Recommendation:           - Await pathology results.                           - Start pantoprazole 40 mg QD.                           - Return to GI clinic in 2-3 months.                           - Perform a colonoscopy today. Dr Particia Lather "[address] [place],  08/27/2023 2:04:07 PM

## 2023-08-27 NOTE — Progress Notes (Signed)
 GASTROENTEROLOGY PROCEDURE H&P NOTE   Primary Care Physician: Esperanza Richters, PA-C    Reason for Procedure:   Change in bowel habits, occasional rectal bleeding, RUQ ab pain, dysphagia  Plan:    EGD/colonoscopy  Patient is appropriate for endoscopic procedure(s) in the ambulatory ([facility]) setting.  The nature of the procedure, as well as the risks, benefits, and alternatives were carefully and thoroughly reviewed with the patient. Ample time for discussion and questions allowed. The patient understood, was satisfied, and agreed to proceed.     HPI: Lorraine Conrad is a 53 y.o. female who presents for EGD/colonoscopy for evaluation of change in bowel habits, occasional rectal bleeding, RUQ ab pain, and dysphagia.  Patient was most recently seen in the Gastroenterology Clinic on 07/22/23.  No interval change in medical history since that appointment. Please refer to that note for full details regarding GI history and clinical presentation.   Past Medical History:  Diagnosis Date   Allergy    SEASONAL   Anemia    Arthritis    "spine" (03/01/2014)   Asthma    Chronic lower back pain    Endometriosis    Erosive gastritis    External hemorrhoids    GERD (gastroesophageal reflux disease)    H/O shortness of breath 2014   Cardiopulmonary exercise test results   Headache    "probably monthly" (03/01/2014)   Migraine 1998; 02/2014   "this one's lasted 10 days straight" (03/01/2014)   OSA on CPAP    Ovarian cyst, left    Proctalgia fugax    Sleep apnea    on cpap   Thyroid disease    thyroid nodule    Past Surgical History:  Procedure Laterality Date   ABDOMINAL HYSTERECTOMY  04/2013   BREAST BIOPSY Bilateral 10/05/2000   BREAST BIOPSY Right 08/26/2019   Fibroadenoma   BREAST EXCISIONAL BIOPSY Left 1991   CESAREAN SECTION  2002; 2006   COLONOSCOPY     LAPAROSCOPIC OVARIAN CYSTECTOMY  ~ 1999   NASAL SEPTUM SURGERY  ~ 1991   OOPHORECTOMY Right 2015   TEMPOROMANDIBULAR  JOINT SURGERY  ~ 1992   UPPER GASTROINTESTINAL ENDOSCOPY      Prior to Admission medications   Medication Sig Start Date End Date Taking? Authorizing Provider  atorvastatin (LIPITOR) 10 MG tablet TAKE 1 TABLET BY MOUTH EVERY DAY 09/11/22  Yes Saguier, Ramon Dredge, PA-C  Azelastine HCl 137 MCG/SPRAY SOLN PLACE 1 SPRAY INTO BOTH NOSTRILS 2 TIMES DAILY AS DIRECTED Patient taking differently: Place 1 spray into both nostrils 2 (two) times daily as needed (Rhinitis). 02/10/23  Yes Saguier, Ramon Dredge, PA-C  acetaminophen (TYLENOL) 500 MG tablet Take 500-1,000 mg by mouth every 6 (six) hours as needed for moderate pain (pain score 4-6).    [provider]  albuterol (VENTOLIN HFA) 108 (90 Base) MCG/ACT inhaler Inhale 2 puffs into the lungs every 6 (six) hours as needed for wheezing or shortness of breath. 07/10/21   Jarold Motto, PA  aspirin EC 81 MG tablet Take 1 tablet (81 mg total) by mouth daily. Swallow whole. 06/17/23   Georgeanna Lea, MD  budesonide-formoterol [facility]) 160-4.5 MCG/ACT inhaler Inhale 2 puffs into the lungs 2 (two) times daily. Patient taking differently: Inhale 2 puffs into the lungs 2 (two) times daily as needed (sob). 10/04/21   Saguier, Ramon Dredge, PA-C  busPIRone (BUSPAR) 7.5 MG tablet Take 1 tablet (7.5 mg total) by mouth 2 (two) times daily. 06/19/23   Saguier, Ramon Dredge, PA-C  celecoxib (CELEBREX)  200 MG capsule Take 1 capsule (200 mg total) by mouth daily. Patient taking differently: Take 200 mg by mouth 2 (two) times daily as needed for moderate pain (pain score 4-6). 10/03/22   Saguier, Ramon Dredge, PA-C  ciclopirox (PENLAC) 8 % solution Apply topically at bedtime. Apply over nail and surrounding skin. Apply daily over previous coat. After seven (7) days, may remove with alcohol and continue cycle. Patient taking differently: Apply 1 Application topically at bedtime as needed (pain). Apply over nail and surrounding skin. Apply daily over previous coat. After seven (7) days, may  remove with alcohol and continue cycle. 08/25/22   Louann Sjogren, DPM  fluticasone (FLONASE) 50 MCG/ACT nasal spray SPRAY 2 SPRAYS INTO EACH NOSTRIL EVERY DAY Patient taking differently: Place 2 sprays into both nostrils daily as needed for allergies or rhinitis. 03/14/21   Saguier, Ramon Dredge, PA-C  ivabradine (CORLANOR) 7.5 MG TABS tablet Take 2 tablets (15 mg total) by mouth as directed. Take 2 hours before your CT Patient not taking: Reported on 08/27/2023 06/17/23   Georgeanna Lea, MD  lansoprazole (PREVACID) 30 MG capsule Take 30 mg by mouth daily as needed (Indigestion). Patient not taking: Reported on 08/27/2023    [provider]  levocetirizine (XYZAL) 5 MG tablet Take 5 mg by mouth daily as needed for allergies.    [provider]  metoprolol tartrate (LOPRESSOR) 25 MG tablet Take 1 tablet (25 mg total) by mouth as directed. Take two hours before you CT Patient not taking: Reported on 08/27/2023 06/17/23 09/15/23  Georgeanna Lea, MD  terbinafine (LAMISIL) 250 MG tablet Take 1 tablet (250 mg total) by mouth daily. Patient not taking: Reported on 08/27/2023 07/06/23 10/04/23  Louann Sjogren, DPM  vitamin B-12 (CYANOCOBALAMIN) 100 MCG tablet Take 100 mcg by mouth daily.    [provider]    Current Outpatient Medications  Medication Sig Dispense Refill   atorvastatin (LIPITOR) 10 MG tablet TAKE 1 TABLET BY MOUTH EVERY DAY 90 tablet 2   Azelastine HCl 137 MCG/SPRAY SOLN PLACE 1 SPRAY INTO BOTH NOSTRILS 2 TIMES DAILY AS DIRECTED (Patient taking differently: Place 1 spray into both nostrils 2 (two) times daily as needed (Rhinitis).) 30 mL 2   acetaminophen (TYLENOL) 500 MG tablet Take 500-1,000 mg by mouth every 6 (six) hours as needed for moderate pain (pain score 4-6).     albuterol (VENTOLIN HFA) 108 (90 Base) MCG/ACT inhaler Inhale 2 puffs into the lungs every 6 (six) hours as needed for wheezing or shortness of breath. 8 g 2   aspirin EC 81 MG tablet Take 1  tablet (81 mg total) by mouth daily. Swallow whole.     budesonide-formoterol (SYMBICORT) 160-4.5 MCG/ACT inhaler Inhale 2 puffs into the lungs 2 (two) times daily. (Patient taking differently: Inhale 2 puffs into the lungs 2 (two) times daily as needed (sob).) 1 each 12   busPIRone (BUSPAR) 7.5 MG tablet Take 1 tablet (7.5 mg total) by mouth 2 (two) times daily. 180 tablet 0   celecoxib (CELEBREX) 200 MG capsule Take 1 capsule (200 mg total) by mouth daily. (Patient taking differently: Take 200 mg by mouth 2 (two) times daily as needed for moderate pain (pain score 4-6).) 20 capsule 0   ciclopirox (PENLAC) 8 % solution Apply topically at bedtime. Apply over nail and surrounding skin. Apply daily over previous coat. After seven (7) days, may remove with alcohol and continue cycle. (Patient taking differently: Apply 1 Application topically at bedtime as needed (pain).  Apply over nail and surrounding skin. Apply daily over previous coat. After seven (7) days, may remove with alcohol and continue cycle.) 6.6 mL 0   fluticasone (FLONASE) 50 MCG/ACT nasal spray SPRAY 2 SPRAYS INTO EACH NOSTRIL EVERY DAY (Patient taking differently: Place 2 sprays into both nostrils daily as needed for allergies or rhinitis.) 16 mL 5   ivabradine (CORLANOR) 7.5 MG TABS tablet Take 2 tablets (15 mg total) by mouth as directed. Take 2 hours before your CT (Patient not taking: Reported on 08/27/2023) 2 tablet 0   lansoprazole (PREVACID) 30 MG capsule Take 30 mg by mouth daily as needed (Indigestion). (Patient not taking: Reported on 08/27/2023)     levocetirizine (XYZAL) 5 MG tablet Take 5 mg by mouth daily as needed for allergies.     metoprolol tartrate (LOPRESSOR) 25 MG tablet Take 1 tablet (25 mg total) by mouth as directed. Take two hours before you CT (Patient not taking: Reported on 08/27/2023) 1 tablet 0   terbinafine (LAMISIL) 250 MG tablet Take 1 tablet (250 mg total) by mouth daily. (Patient not taking: Reported on  08/27/2023) 30 tablet 2   vitamin B-12 (CYANOCOBALAMIN) 100 MCG tablet Take 100 mcg by mouth daily.     Current Facility-Administered Medications  Medication Dose Route Frequency Provider Last Rate Last Admin   0.9 %  sodium chloride infusion  500 mL Intravenous Once Imogene Burn, MD        Allergies as of 08/27/2023 - Review Complete 08/27/2023  Allergen Reaction Noted   Promethazine hcl Other (See Comments) 11/15/2007   Azithromycin Rash 07/31/2011   Cephalexin Diarrhea and Itching 02/13/2015   Codeine Other (See Comments) 11/15/2007   Dilaudid [hydromorphone] Nausea And Vomiting 02/24/2014   Soy allergy (obsolete) Itching 02/25/2017    Family History  Problem Relation Age of Onset   Diverticulitis Mother    Colon polyps Father    Heart disease Father    Colon polyps Paternal Grandmother    Breast cancer Paternal Grandmother    Prostate cancer Paternal Grandfather    Colon polyps Paternal Grandfather    Colon cancer Neg Hx    Esophageal cancer Neg Hx    Rectal cancer Neg Hx    Stomach cancer Neg Hx    Pancreatic cancer Neg Hx     Social History   Socioeconomic History   Marital status: Married    Spouse name: Not on file   Number of children: 2   Years of education: Not on file   Highest education level: Not on file  Occupational History   Occupation: homemaker  Tobacco Use   Smoking status: Never   Smokeless tobacco: Never   Tobacco comments:    father smoked in house growing up per pt.   Vaping Use   Vaping status: Never Used  Substance and Sexual Activity   Alcohol use: Yes    Alcohol/week: 0.0 standard drinks of alcohol    Comment: 12-23-16 "I'll have 1-2 drinks maybe 3-4 times/yr"   Drug use: No   Sexual activity: Yes    Birth control/protection: Surgical  Other Topics Concern   Not on file  Social History Narrative   Daily caffiene use: 6 cups per day   Patient does not get regular excercise   Social Drivers of Health   Financial Resource  Strain: Low Risk  (09/23/2022)   Received from [facility]   Overall Financial Resource Strain (CARDIA)    Difficulty of Paying Living Expenses:  Not hard at all  Food Insecurity: No Food Insecurity (09/23/2022)   Received from [facility], [facility]   Hunger Vital Sign    Worried About Running Out of Food in the Last Year: Never true    Ran Out of Food in the Last Year: Never true  Transportation Needs: No Transportation Needs (09/23/2022)   Received from [facility], [facility]   [facility] - Transportation    Lack of Transportation (Medical): No    Lack of Transportation (Non-Medical): No  Physical Activity: Not on file  Stress: Not on file  Social Connections: Unknown (10/18/2021)   Received from [facility], [facility]   Social Network    Social Network: Not on file  Intimate Partner Violence: Unknown (09/09/2021)   Received from [facility], [facility]   HITS    Physically Hurt: Not on file    Insult or Talk Down To: Not on file    Threaten Physical Harm: Not on file    Scream or Curse: Not on file    Physical Exam: Vital signs in last 24 hours: BP 102/70   Pulse 88   Temp 98.5 F (36.9 C) (Skin)   Ht 5\' 4"  (1.626 m)   Wt 233 lb (105.7 kg)   LMP 02/08/2013   SpO2 97%   BMI 39.99 kg/m  GEN: NAD EYE: Sclerae anicteric ENT: MMM CV: Non-tachycardic Pulm: No increased WOB GI: Soft NEURO:  Alert & Oriented   Eulah Pont, MD [facility]   08/27/2023 1:16 PM

## 2023-08-27 NOTE — Patient Instructions (Addendum)
 Resume previous diet Continue present medications, fiber supplement recommended and begin taking Pantoprazole Pick up new Rx: Pantoprazole 40 mg daily (take 30 minutes before eating) Await pathology results Return to clinic in 2-3 months, appointment on Friday June 13 at 1050 AM, call to reschedule if necessary.  Handouts/information given for schatzki ring, hiatal hernia, duodenititis, pantoprazole, fiber supplement, polyps, diverticulosis and hemorrhoids  YOU HAD AN ENDOSCOPIC PROCEDURE TODAY AT THE [facility]:   Refer to the procedure report that was given to you for any specific questions about what was found during the examination.  If the procedure report does not answer your questions, please call your gastroenterologist to clarify.  If you requested that your care partner not be given the details of your procedure findings, then the procedure report has been included in a sealed envelope for you to review at your convenience later.  YOU SHOULD EXPECT: Some feelings of bloating in the abdomen. Passage of more gas than usual.  Walking can help get rid of the air that was put into your GI tract during the procedure and reduce the bloating. If you had a lower endoscopy (such as a colonoscopy or flexible sigmoidoscopy) you may notice spotting of blood in your stool or on the toilet paper. If you underwent a bowel prep for your procedure, you may not have a normal bowel movement for a few days.  Please Note:  You might notice some irritation and congestion in your nose or some drainage.  This is from the oxygen used during your procedure.  There is no need for concern and it should clear up in a day or so.  SYMPTOMS TO REPORT IMMEDIATELY:  Following lower endoscopy (colonoscopy):  Excessive amounts of blood in the stool  Significant tenderness or worsening of abdominal pains  Swelling of the abdomen that is new, acute  Fever of 100F or higher Following upper endoscopy  (EGD)  Vomiting of blood or coffee ground material  New chest pain or pain under the shoulder blades  Painful or persistently difficult swallowing  New shortness of breath  Black, tarry-looking stools  For urgent or emergent issues, a gastroenterologist can be reached at any hour by calling [phone]. Do not use MyChart messaging for urgent concerns.   DIET:  We do recommend a small meal at first, but then you may proceed to your regular diet.  Drink plenty of fluids but you should avoid alcoholic beverages for 24 hours.  ACTIVITY:  You should plan to take it easy for the rest of today and you should NOT DRIVE or use heavy machinery until tomorrow (because of the sedation medicines used during the test).    FOLLOW UP: Our staff will call the number listed on your records the next business day following your procedure.  We will call around 7:15- 8:00 am to check on you and address any questions or concerns that you may have regarding the information given to you following your procedure. If we do not reach you, we will leave a message.     If any biopsies were taken you will be contacted by phone or by letter within the next 1-3 weeks.  Please call us at [phone] if you have not heard about the biopsies in 3 weeks.    SIGNATURES/CONFIDENTIALITY: You and/or your care partner have signed paperwork which will be entered into your electronic medical record.  These signatures attest to the fact that that the information above on your After Visit  Summary has been reviewed and is understood.  Full responsibility of the confidentiality of this discharge information lies with you and/or your care-partner.

## 2023-08-27 NOTE — Progress Notes (Signed)
 Pt's states no medical or surgical changes since previsit or office visit.

## 2023-08-27 NOTE — Op Note (Signed)
 [facility] Patient Name: Lorraine Conrad Procedure Date: 08/27/2023 1:09 PM MRN: [id number] Endoscopist: Madelyn Brunner [place] , , [id number] Age: 53 Referring MD:  Date of Birth: 1971/04/28 Gender: Female Account #: [id number] Procedure:                Colonoscopy Indications:              Change in bowel habits, Change in stool caliber Medicines:                Monitored Anesthesia Care Procedure:                Pre-Anesthesia Assessment:                           - Prior to the procedure, a History and Physical                            was performed, and patient medications and                            allergies were reviewed. The patient's tolerance of                            previous anesthesia was also reviewed. The risks                            and benefits of the procedure and the sedation                            options and risks were discussed with the patient.                            All questions were answered, and informed consent                            was obtained. Prior Anticoagulants: The patient has                            taken no anticoagulant or antiplatelet agents. ASA                            Grade Assessment: III - A patient with severe                            systemic disease. After reviewing the risks and                            benefits, the patient was deemed in satisfactory                            condition to undergo the procedure.                           After obtaining informed consent, the colonoscope  was passed under direct vision. Throughout the                            procedure, the patient's blood pressure, pulse, and                            oxygen saturations were monitored continuously. The                            CF [id number] #[id number] was introduced through the anus                            and advanced to the the terminal ileum. The                             colonoscopy was performed without difficulty. The                            patient tolerated the procedure well. The quality                            of the bowel preparation was excellent. The                            terminal ileum, ileocecal valve, appendiceal                            orifice, and rectum were photographed. Scope In: 1:39:00 PM Scope Out: 1:55:25 PM Scope Withdrawal Time: 0 hours 11 minutes 20 seconds  Total Procedure Duration: 0 hours 16 minutes 25 seconds  Findings:                 The terminal ileum appeared normal.                           A 3 mm polyp was found in the sigmoid colon. The                            polyp was sessile. The polyp was removed with a                            cold snare. Resection and retrieval were complete.                           Multiple diverticula were found in the sigmoid                            colon.                           Non-bleeding internal hemorrhoids were found during                            retroflexion. Complications:  No immediate complications. Estimated Blood Loss:     Estimated blood loss was minimal. Impression:               - The examined portion of the ileum was normal.                           - One 3 mm polyp in the sigmoid colon, removed with                            a cold snare. Resected and retrieved.                           - Diverticulosis in the sigmoid colon.                           - Non-bleeding internal hemorrhoids. Recommendation:           - Discharge patient to home (with escort).                           - Await pathology results.                           - Start a daily fiber supplement.                           - The findings and recommendations were discussed                            with the patient. Dr Particia Lather [address] [place],  08/27/2023 2:07:38 PM

## 2023-08-28 ENCOUNTER — Telehealth: Payer: Self-pay | Admitting: *Deleted

## 2023-08-28 NOTE — Telephone Encounter (Signed)
  Follow up Call-     08/27/2023   12:46 PM  Call back number  Post procedure Call Back phone  # [phone]  Permission to leave phone message Yes     Patient questions:  Do you have a fever, pain , or abdominal swelling? No. Pain Score  0 *  Have you tolerated food without any problems? Yes.    Have you been able to return to your normal activities? Yes.    Do you have any questions about your discharge instructions: Diet   No. Medications  No. Follow up visit  No.  Do you have questions or concerns about your Care? No.  Actions: * If pain score is 4 or above: No action needed, pain <4.

## 2023-08-31 ENCOUNTER — Ambulatory Visit: Admitting: Podiatry

## 2023-09-01 DIAGNOSIS — F4323 Adjustment disorder with mixed anxiety and depressed mood: Secondary | ICD-10-CM | POA: Diagnosis not present

## 2023-09-01 LAB — SURGICAL PATHOLOGY

## 2023-09-03 ENCOUNTER — Encounter: Payer: Self-pay | Admitting: Internal Medicine

## 2023-09-07 ENCOUNTER — Ambulatory Visit: Payer: BC Managed Care – PPO | Admitting: Cardiology

## 2023-09-07 DIAGNOSIS — F4323 Adjustment disorder with mixed anxiety and depressed mood: Secondary | ICD-10-CM | POA: Diagnosis not present

## 2023-09-14 DIAGNOSIS — F4323 Adjustment disorder with mixed anxiety and depressed mood: Secondary | ICD-10-CM | POA: Diagnosis not present

## 2023-09-24 DIAGNOSIS — F4323 Adjustment disorder with mixed anxiety and depressed mood: Secondary | ICD-10-CM | POA: Diagnosis not present

## 2023-10-02 ENCOUNTER — Other Ambulatory Visit: Payer: Self-pay | Admitting: Medical

## 2023-10-05 ENCOUNTER — Other Ambulatory Visit: Payer: Self-pay | Admitting: Medical

## 2023-10-05 ENCOUNTER — Ambulatory Visit (INDEPENDENT_AMBULATORY_CARE_PROVIDER_SITE_OTHER): Payer: BC Managed Care – PPO | Admitting: Podiatry

## 2023-10-05 DIAGNOSIS — F4323 Adjustment disorder with mixed anxiety and depressed mood: Secondary | ICD-10-CM | POA: Diagnosis not present

## 2023-10-05 DIAGNOSIS — Z91199 Patient's noncompliance with other medical treatment and regimen due to unspecified reason: Secondary | ICD-10-CM

## 2023-10-05 NOTE — Progress Notes (Signed)
 No show

## 2023-10-07 ENCOUNTER — Ambulatory Visit: Admitting: Medical

## 2023-10-07 ENCOUNTER — Encounter: Payer: Self-pay | Admitting: Medical

## 2023-10-07 ENCOUNTER — Ambulatory Visit (INDEPENDENT_AMBULATORY_CARE_PROVIDER_SITE_OTHER): Admitting: Medical

## 2023-10-07 VITALS — BP 136/56 | HR 72 | Temp 98.2°F | Resp 18 | Ht 64.0 in | Wt 219.8 lb

## 2023-10-07 DIAGNOSIS — R0789 Other chest pain: Secondary | ICD-10-CM | POA: Diagnosis not present

## 2023-10-07 DIAGNOSIS — G4489 Other headache syndrome: Secondary | ICD-10-CM

## 2023-10-07 DIAGNOSIS — F439 Reaction to severe stress, unspecified: Secondary | ICD-10-CM

## 2023-10-07 DIAGNOSIS — F411 Generalized anxiety disorder: Secondary | ICD-10-CM | POA: Diagnosis not present

## 2023-10-07 LAB — TROPONIN I (HIGH SENSITIVITY): High Sens Troponin I: 4 ng/L (ref 2–17)

## 2023-10-07 MED ORDER — FLUTICASONE PROPIONATE 50 MCG/ACT NA SUSP: 2.0000 | Freq: Every day | NASAL | 1 refills | Status: DC

## 2023-10-07 MED ORDER — KETOROLAC TROMETHAMINE 60 MG/2ML IM SOLN
60.0000 mg | Freq: Once | INTRAMUSCULAR | Status: AC
  Administered 2023-10-07: 60 mg via INTRAMUSCULAR

## 2023-10-07 MED ORDER — CLONAZEPAM 0.5 MG PO TABS: 0.5000 mg | ORAL_TABLET | Freq: Two times a day (BID) | ORAL | 0 refills | Status: DC | PRN

## 2023-10-07 MED ORDER — CYCLOBENZAPRINE HCL 5 MG PO TABS: 5.0000 mg | ORAL_TABLET | Freq: Every day | ORAL | 0 refills | Status: AC

## 2023-10-07 MED ORDER — SUMATRIPTAN SUCCINATE 50 MG PO TABS: 50.0000 mg | ORAL_TABLET | ORAL | 0 refills | Status: AC | PRN

## 2023-10-07 NOTE — Progress Notes (Signed)
 Subjective:    Patient ID: Lorraine Conrad, female    DOB: 12/11/1970, 53 y.o.   MRN: [id number]  HPI  Lorraine Conrad is a 53 year old female with a history of migraines who presents with headache and recent chest pain.  She has been experiencing a headache that began yesterday, accompanied by chest pain described as a tightness, which resolved by the next morning. Breathing exercises alleviated the chest discomfort, but the headache persisted. She took Tylenol  today for the headache. No current chest pain is reported.  The headache was severe on Sunday and recurred yesterday. Light sensitivity was noted when outside with her son, but it is not like her previous migraines. She has a history of migraines, which were more frequent in the past, now occurring once or twice a year. She has not used triptan medications like Imitrex  or sumatriptan  for migraines. No vision changes, slurred speech, or weakness in extremities are reported.  She is experiencing stress due to personal circumstances, including the recent loss of her father, her mother's illness with shingles and Meniere's disease, and her children's life changes. She has not started Buspar  or sertraline  for anxiety but has used clonazepam  occasionally for panic attacks, with only half a pill remaining from an initial prescription of eight tablets. She has not been taking medication for high cholesterol.    After finished visit(giving AVS) and returned to room states ha swooshing sound left  ear. More at night when lays down. Started about 2 months ago. Had allergies symptoms for past 2 months. Hx of ruptured tm left side years ago during honeymmonn.    Review of Systems  Constitutional:  Negative for chills, fatigue and fever.  HENT:  Negative for congestion, ear pain, nosebleeds, sinus pressure and sinus pain.        Tinnitus.  Respiratory:  Negative for chest tightness, shortness of breath and wheezing.   Cardiovascular:  Negative  for chest pain and palpitations.       Atypical chest pain. None since last night as fell asleep early this morning 7 am or so pain resolved and no recurrence.  Gastrointestinal:  Negative for abdominal distention, abdominal pain, blood in stool and nausea.  Musculoskeletal:  Negative for back pain.  Skin:  Negative for rash.  Neurological:  Positive for headaches. Negative for dizziness, syncope, speech difficulty, weakness, light-headedness and numbness.  Hematological:  Negative for adenopathy. Does not bruise/bleed easily.  Psychiatric/Behavioral:  Negative for agitation, dysphoric mood, sleep disturbance and suicidal ideas. The patient is nervous/anxious.    Past Medical History:  Diagnosis Date   Allergy    SEASONAL   Anemia    Arthritis    "spine" (03/01/2014)   Asthma    Chronic lower back pain    Endometriosis    Erosive gastritis    External hemorrhoids    GERD (gastroesophageal reflux disease)    H/O shortness of breath 2014   Cardiopulmonary exercise test results   Headache    "probably monthly" (03/01/2014)   Migraine 1998; 02/2014   "this one's lasted 10 days straight" (03/01/2014)   OSA on CPAP    Ovarian cyst, left    Proctalgia fugax    Sleep apnea    on cpap   Thyroid  disease    thyroid  nodule     Social History   Socioeconomic History   Marital status: Married    Spouse name: Not on file   Number of children: 2   Years of  education: Not on file   Highest education level: Not on file  Occupational History   Occupation: homemaker  Tobacco Use   Smoking status: Never   Smokeless tobacco: Never   Tobacco comments:    father smoked in house growing up per pt.   Vaping Use   Vaping status: Never Used  Substance and Sexual Activity   Alcohol use: Yes    Alcohol/week: 0.0 standard drinks of alcohol    Comment: 12-23-16 "I'll have 1-2 drinks maybe 3-4 times/yr"   Drug use: No   Sexual activity: Yes    Birth control/protection: Surgical  Other Topics  Concern   Not on file  Social History Narrative   Daily caffiene use: 6 cups per day   Patient does not get regular excercise   Social Drivers of Health   Financial Resource Strain: Low Risk  (09/23/2022)   Received from [facility], [facility]   Overall Financial Resource Strain (CARDIA)    Difficulty of Paying Living Expenses: Not hard at all  Food Insecurity: No Food Insecurity (09/23/2022)   [facility]   Hunger Vital Sign    Worried About Running Out of Food in the Last Year: Never true    Ran Out of Food in the Last Year: Never true  Transportation Needs: No Transportation Needs (09/23/2022)   Received from [facility]   PRAPARE - Transportation    Lack of Transportation (Medical): No    Lack of Transportation (Non-Medical): No  Physical Activity: Not on file  Stress: Not on file  Social Connections: Unknown (10/18/2021)   Received from [facility]   Social Network    Social Network: Not on file  Intimate Partner Violence: Unknown (09/09/2021)   Received from [facility], [facility]   HITS    Physically Hurt: Not on file    Insult or Talk Down To: Not on file    Threaten Physical Harm: Not on file    Scream or Curse: Not on file    Past Surgical History:  Procedure Laterality Date   ABDOMINAL HYSTERECTOMY  04/2013   BREAST BIOPSY Bilateral 10/05/2000   BREAST BIOPSY Right 08/26/2019   Fibroadenoma   BREAST EXCISIONAL BIOPSY Left 1991   CESAREAN SECTION  2002; 2006   COLONOSCOPY     LAPAROSCOPIC OVARIAN CYSTECTOMY  ~ 1999   NASAL SEPTUM SURGERY  ~ 1991   OOPHORECTOMY Right 2015   TEMPOROMANDIBULAR JOINT SURGERY  ~ 1992   UPPER GASTROINTESTINAL ENDOSCOPY      Family History  Problem Relation Age of Onset   Diverticulitis Mother    Colon polyps Father    Heart disease Father    Colon polyps Paternal Grandmother    Breast cancer Paternal Grandmother    Prostate cancer Paternal  Grandfather    Colon polyps Paternal Grandfather    Colon cancer Neg Hx    Esophageal cancer Neg Hx    Rectal cancer Neg Hx    Stomach cancer Neg Hx    Pancreatic cancer Neg Hx     Allergies  Allergen Reactions   Promethazine Hcl Other (See Comments)    Violent tremors    Azithromycin Rash    Head to toe rash   Cephalexin  Diarrhea and Itching   Codeine Other (See Comments)    Was a child when she took this.   Dilaudid [Hydromorphone] Nausea And Vomiting   Soy Allergy (Obsolete) Itching    Current Outpatient  Medications on File Prior to Visit  Medication Sig Dispense Refill   acetaminophen  (TYLENOL ) 500 MG tablet Take 500-1,000 mg by mouth every 6 (six) hours as needed for moderate pain (pain score 4-6).     albuterol  (VENTOLIN  HFA) 108 (90 Base) MCG/ACT inhaler Inhale 2 puffs into the lungs every 6 (six) hours as needed for wheezing or shortness of breath. 8 g 2   aspirin  EC 81 MG tablet Take 1 tablet (81 mg total) by mouth daily. Swallow whole.     atorvastatin  (LIPITOR) 10 MG tablet TAKE 1 TABLET BY MOUTH EVERY DAY 30 tablet 8   Azelastine  HCl 137 MCG/SPRAY SOLN PLACE 1 SPRAY INTO BOTH NOSTRILS 2 TIMES DAILY AS DIRECTED (Patient taking differently: Place 1 spray into both nostrils 2 (two) times daily as needed (Rhinitis).) 30 mL 2   budesonide -formoterol  (SYMBICORT ) 160-4.5 MCG/ACT inhaler Inhale 2 puffs into the lungs 2 (two) times daily. (Patient taking differently: Inhale 2 puffs into the lungs 2 (two) times daily as needed (sob).) 1 each 12   busPIRone  (BUSPAR ) 7.5 MG tablet TAKE 1 TABLET BY MOUTH 2 TIMES DAILY. 60 tablet 2   celecoxib  (CELEBREX ) 200 MG capsule Take 1 capsule (200 mg total) by mouth daily. (Patient taking differently: Take 200 mg by mouth 2 (two) times daily as needed for moderate pain (pain score 4-6).) 20 capsule 0   ciclopirox  (PENLAC ) 8 % solution Apply topically at bedtime. Apply over nail and surrounding skin. Apply daily over previous coat. After seven  (7) days, may remove with alcohol and continue cycle. (Patient taking differently: Apply 1 Application topically at bedtime as needed (pain). Apply over nail and surrounding skin. Apply daily over previous coat. After seven (7) days, may remove with alcohol and continue cycle.) 6.6 mL 0   fluticasone  (FLONASE ) 50 MCG/ACT nasal spray SPRAY 2 SPRAYS INTO EACH NOSTRIL EVERY DAY (Patient taking differently: Place 2 sprays into both nostrils daily as needed for allergies or rhinitis.) 16 mL 5   ivabradine  (CORLANOR) 7.5 MG TABS tablet Take 2 tablets (15 mg total) by mouth as directed. Take 2 hours before your CT (Patient not taking: Reported on 08/27/2023) 2 tablet 0   lansoprazole (PREVACID) 30 MG capsule Take 30 mg by mouth daily as needed (Indigestion). (Patient not taking: Reported on 08/27/2023)     levocetirizine (XYZAL ) 5 MG tablet Take 5 mg by mouth daily as needed for allergies.     metoprolol  tartrate (LOPRESSOR ) 25 MG tablet Take 1 tablet (25 mg total) by mouth as directed. Take two hours before you CT (Patient not taking: Reported on 08/27/2023) 1 tablet 0   pantoprazole  (PROTONIX ) 40 MG tablet Take 1 tablet (40 mg total) by mouth daily. 90 tablet 3   vitamin B-12 (CYANOCOBALAMIN ) 100 MCG tablet Take 100 mcg by mouth daily.     No current facility-administered medications on file prior to visit.    BP (!) 136/56   Pulse 72   Temp 98.2 F (36.8 C)   Resp 18   Ht 5\' 4"  (1.626 m)   Wt 219 lb 12.8 oz (99.7 kg)   LMP 02/08/2013   SpO2 99%   BMI 37.73 kg/m       Objective:   Physical Exam  General Mental Status- Alert. General Appearance- Not in acute distress.   Skin General: Color- Normal Color. Moisture- Normal Moisture.  Neck Carotid Arteries- Normal color. Moisture- Normal Moisture. No carotid bruits. No JVD.  Chest and Lung Exam Auscultation: Breath Sounds:  CTA  Cardiovascular Auscultation:Rythm- RRR Murmurs & Other Heart Sounds:Auscultation of the heart reveals- No  Murmurs.  Abdomen Inspection:-Inspeection Normal. Palpation/Percussion:Note:No mass. Palpation and Percussion of the abdomen reveal- Non Tender, Non Distended + BS, no rebound or guarding.   Neurologic Cranial Nerve exam:- CN III-XII intact(No nystagmus), symmetric smile. Strength:- 5/5 equal and symmetric strength both upper and lower extremities.  Finger to nose intact. No hand drift.   Lower ext- no pedal edema, no calf pain, negative homans signs.    Assessment & Plan:   Patient Instructions  Atypical Chest pain Intermittent chest pain likely stress-related. Atypical presentation, cardiac causes to be ruled out. Previous cardiac CTs showed zero calcification. High cholesterol noted. - Order EKG(nsr no ischemic changes seen) and troponin test stat. No pain since this am so consecutive troponin not indicated - Encourage resumption of cholesterol medication.  Hyperlipidemia _advised to resume cholesterol medication -continue atorvastatin .   Tension headache Likely tension headache due to stress. Tylenol  provided some relief. Toradol  considered for efficacy. - Administer Toradol  60 mg. - Prescribe muscle relaxant (5 mg, 3 tablets). - Prescribe Imitrex  for future migraine-like headaches. - Advise to report impact of Toradol  by tomorrow. -if ha worsens/changes signs/symptoms as disucssed then ED evaluation.  Anxiety Anxiety with recent stressors. Previously prescribed sertraline  and Buspar  not started. Occasional clonazepam  use for panic attacks. - Prescribe limited clonazepam .  Follow up date to be determined after you update me on response to toradol  60 mg im tomorrow late morning or afternoon   Janiaya Ryser, PA-C   Time spent with patient today was  42 minutes which consisted of chart revdiew, discussing diagnosis, work up treatment and documentation.

## 2023-10-07 NOTE — Patient Instructions (Addendum)
 Atypical Chest pain Intermittent chest pain likely stress-related. Atypical presentation, cardiac causes to be ruled out. Previous cardiac CTs showed zero calcification. High cholesterol noted. - Order EKG(nsr no ischemic changes seen) and troponin test stat. No pain since this am so consecutive troponin not indicated - Encourage resumption of cholesterol medication.  Hyperlipidemia _advised to resume cholesterol medication -continue atorvastatin .   Tension headache Likely tension headache due to stress. Tylenol  provided some relief. Toradol  considered for efficacy. - Administer Toradol  60 mg. - Prescribe muscle relaxant (5 mg, 3 tablets). - Prescribe Imitrex  for future migraine-like headaches. - Advise to report impact of Toradol  by tomorrow. -if ha worsens/changes signs/symptoms as disucssed then ED evaluation.  Anxiety Anxiety with recent stressors. Previously prescribed sertraline  and Buspar  not started. Occasional clonazepam  use for panic attacks. - Prescribe limited clonazepam .  Tinnitus -rx flonase  as allergy may be cause. If persists then expand consideration differential dx/work up.  Follow up date to be determined after you update me on response to toradol  60 mg im tomorrow late morning or afternoon  Tinnitus Tinnitus is when you hear a sound that there's no actual source for. It may sound like ringing in your ears or something else. The sound may be loud, soft, or somewhere in between. It can last for a few seconds or be constant for days. It can come and go. Almost everyone has tinnitus at some point. It's not the same as hearing loss. But you may need to see a health care provider if: It lasts for a long time. It comes back often. You have trouble sleeping and focusing. What are the causes? The cause of tinnitus is often unknown. In some cases, you may get it if: You're around loud noises, such as from machines or music. An object gets stuck in your ear. Earwax  builds up in Landscape architect. You drink a lot of alcohol or caffeine . You take certain medicines. You start to lose your hearing. You may also get it from some medical conditions. These may include: Ear or sinus infections. Heart diseases. High blood pressure. Allergies. Mnire's disease. Problems with your thyroid . A tumor. This is a growth of cells that isn't normal. A weak, bulging blood vessel called an aneurysm near your ear. What increases the risk? You may be more likely to get tinnitus if: You're around loud noises a lot. You're older. You drink alcohol. You smoke. What are the signs or symptoms? The main symptom is hearing a sound that there's no source for. It may sound like ringing. It may also sound like: Buzzing. Sizzling. Blowing air. Hissing. Whistling. Other sounds may include: Roaring. Running water. A musical note. Tapping. Humming. You may have symptoms in one ear or both ears. How is this diagnosed? Tinnitus is diagnosed based on your symptoms, your medical history, and an exam. Your provider may do a full hearing test if your tinnitus: Is in just one ear. Makes it hard for you to hear. Lasts 6 months or longer. You may also need to see an expert in hearing disorders called an audiologist. They may ask you about your symptoms and how tinnitus affects your daily life. You may have other tests done. These may include: A CT scan. An MRI. An angiogram. This shows how blood flows through your blood vessels. How is this treated? Treatment may include: Therapy to help you manage the stress of living with tinnitus. Finding ways to mask or cover the sound of tinnitus. These include: Sound or white noise machines.  Devices that fit in your ear and play sounds or music. A loud humidifier. Acoustic neural stimulation. This is when you use headphones to listen to music that has a special signal in it. Over time, this signal may change some of the pathways in your  brain. This can make you less sensitive to tinnitus. This treatment is used for very severe cases. Using hearing aids or cochlear implants if your tinnitus is from hearing loss. If your tinnitus is caused by a medical condition, treating the condition may make it go away.  Follow these instructions at home: Managing symptoms     Try to avoid being in loud places or around loud noises. Wear earplugs or headphones when you're around loud noises. Find ways to reduce stress. These may include meditation, yoga, or deep breathing. Sleep with your head slightly raised. General instructions Take over-the-counter and prescription medicines only as told by your provider. Track the things that cause symptoms (triggers). Try to avoid these things. To stop your tinnitus from getting worse: Do not drink alcohol. Do not have caffeine . Do not use any products that contain nicotine or tobacco. These products include cigarettes, chewing tobacco, and vaping devices, such as e-cigarettes. If you need help quitting, ask your provider. Avoid using too much salt. Get enough sleep each night. Where to find more information American Tinnitus Association: https://www.johnson-hamilton.org/ Contact a health care provider if: Your symptoms last for 3 weeks or longer without stopping. You have sudden hearing loss. Your symptoms get worse or don't get better with home care. You can't manage the stress of living with tinnitus. Get help right away if: You get tinnitus after a head injury. You have tinnitus and: Dizziness. Nausea and vomiting. Loss of balance. A sudden, severe headache. Changes to your eyesight. Weakness in your face, arms, or legs. These symptoms may be an emergency. Get help right away. Call 911. Do not wait to see if the symptoms will go away. Do not drive yourself to the hospital. This information is not intended to replace advice given to you by your health care provider. Make sure you discuss any questions you  have with your health care provider. Document Revised: 09/01/2022 Document Reviewed: 09/01/2022 Elsevier Patient Education  2024 [employer].

## 2023-10-09 ENCOUNTER — Encounter: Payer: Self-pay | Admitting: Medical

## 2023-10-12 DIAGNOSIS — F4323 Adjustment disorder with mixed anxiety and depressed mood: Secondary | ICD-10-CM | POA: Diagnosis not present

## 2023-10-13 ENCOUNTER — Encounter: Payer: Self-pay | Admitting: Cardiology

## 2023-10-13 ENCOUNTER — Ambulatory Visit: Attending: Cardiology | Admitting: Cardiology

## 2023-10-13 VITALS — BP 116/80 | HR 86 | Ht 64.0 in | Wt 217.0 lb

## 2023-10-13 DIAGNOSIS — R0609 Other forms of dyspnea: Secondary | ICD-10-CM

## 2023-10-13 DIAGNOSIS — I2510 Atherosclerotic heart disease of native coronary artery without angina pectoris: Secondary | ICD-10-CM | POA: Insufficient documentation

## 2023-10-13 DIAGNOSIS — E782 Mixed hyperlipidemia: Secondary | ICD-10-CM

## 2023-10-13 NOTE — Patient Instructions (Addendum)
 Medication Instructions:  Your physician recommends that you continue on your current medications as directed. Please refer to the Current Medication list given to you today.  *If you need a refill on your cardiac medications before your next appointment, please call your pharmacy*   Lab Work: 3rd Floor   [address]  Your physician recommends that you return for lab work in:   6 weeks You need to have labs done when you are fasting.  You can come Monday through Friday 8:00 am to 11:30AM and 1:00 to 4:00. You do not need to make an appointment as the order has already been placed.    Testing/Procedures: None Ordered   Follow-Up: At [facility], you and your health needs are our priority.  As part of our continuing mission to provide you with exceptional heart care, we have created designated Provider Care Teams.  These Care Teams include your primary Cardiologist (physician) and Advanced Practice Providers (APPs -  Physician Assistants and Nurse Practitioners) who all work together to provide you with the care you need, when you need it.  We recommend signing up for the patient portal called "MyChart".  Sign up information is provided on this After Visit Summary.  MyChart is used to connect with patients for Virtual Visits (Telemedicine).  Patients are able to view lab/test results, encounter notes, upcoming appointments, etc.  Non-urgent messages can be sent to your provider as well.   To learn more about what you can do with MyChart, go to [email or web address].    Your next appointment:   12 month(s)  The format for your next appointment:   In Person  Provider:   Ralene Burger, MD    Other Instructions NA

## 2023-10-13 NOTE — Progress Notes (Signed)
 Cardiology Office Note:    Date:  10/13/2023   ID:  Bevely Brush, DOB 03-17-1971, MRN [id number]  PCP:  Sylvia Everts, PA-C  Cardiologist:  Ralene Burger, MD    Referring MD: Sylvia Everts, [place]   Chief Complaint  Patient presents with   Results    History of Present Illness:    Lorraine Conrad is a 53 y.o. female past medical history significant for GERD, dyslipidemia, who was referred to us  because of atypical chest pain as well as shortness of breath, coronary CT angio showed only mild disease involving distal circumflex artery calcium  score 0 plaque volume only 15 mm, very low she comes to talk about this.  Overall doing well.  I encouraged her to be to be more active.  Since have seen her last time she described to have some shortness of breath but no chest pain tightness squeezing pressure burning chest  Past Medical History:  Diagnosis Date   Allergy    SEASONAL   Anemia    Arthritis    "spine" (03/01/2014)   Asthma    Chronic lower back pain    Endometriosis    Erosive gastritis    External hemorrhoids    GERD (gastroesophageal reflux disease)    H/O shortness of breath 2014   Cardiopulmonary exercise test results   Headache    "probably monthly" (03/01/2014)   Migraine 1998; 02/2014   "this one's lasted 10 days straight" (03/01/2014)   OSA on CPAP    Ovarian cyst, left    Proctalgia fugax    Sleep apnea    on cpap   Thyroid  disease    thyroid  nodule    Past Surgical History:  Procedure Laterality Date   ABDOMINAL HYSTERECTOMY  04/2013   BREAST BIOPSY Bilateral 10/05/2000   BREAST BIOPSY Right 08/26/2019   Fibroadenoma   BREAST EXCISIONAL BIOPSY Left 1991   CESAREAN SECTION  2002; 2006   COLONOSCOPY     LAPAROSCOPIC OVARIAN CYSTECTOMY  ~ 1999   NASAL SEPTUM SURGERY  ~ 1991   OOPHORECTOMY Right 2015   TEMPOROMANDIBULAR JOINT SURGERY  ~ 1992   UPPER GASTROINTESTINAL ENDOSCOPY      Current Medications: Current Meds  Medication Sig    acetaminophen  (TYLENOL ) 500 MG tablet Take 500-1,000 mg by mouth every 6 (six) hours as needed for moderate pain (pain score 4-6).   albuterol  (VENTOLIN  HFA) 108 (90 Base) MCG/ACT inhaler Inhale 2 puffs into the lungs every 6 (six) hours as needed for wheezing or shortness of breath.   aspirin  EC 81 MG tablet Take 1 tablet (81 mg total) by mouth daily. Swallow whole.   atorvastatin  (LIPITOR) 10 MG tablet TAKE 1 TABLET BY MOUTH EVERY DAY   Azelastine  HCl 137 MCG/SPRAY SOLN PLACE 1 SPRAY INTO BOTH NOSTRILS 2 TIMES DAILY AS DIRECTED (Patient taking differently: Place 1 spray into both nostrils 2 (two) times daily as needed (Rhinitis).)   budesonide -formoterol  (SYMBICORT ) 160-4.5 MCG/ACT inhaler Inhale 2 puffs into the lungs 2 (two) times daily. (Patient taking differently: Inhale 2 puffs into the lungs 2 (two) times daily as needed (sob).)   busPIRone  (BUSPAR ) 7.5 MG tablet TAKE 1 TABLET BY MOUTH 2 TIMES DAILY.   celecoxib  (CELEBREX ) 200 MG capsule Take 1 capsule (200 mg total) by mouth daily. (Patient taking differently: Take 200 mg by mouth 2 (two) times daily as needed for moderate pain (pain score 4-6).)   ciclopirox  (PENLAC ) 8 % solution Apply topically at bedtime. Apply over nail and  surrounding skin. Apply daily over previous coat. After seven (7) days, may remove with alcohol and continue cycle. (Patient taking differently: Apply 1 Application topically at bedtime as needed (pain). Apply over nail and surrounding skin. Apply daily over previous coat. After seven (7) days, may remove with alcohol and continue cycle.)   clonazePAM  (KLONOPIN ) 0.5 MG tablet Take 1 tablet (0.5 mg total) by mouth 2 (two) times daily as needed for anxiety.   cyclobenzaprine  (FLEXERIL ) 5 MG tablet Take 1 tablet (5 mg total) by mouth at bedtime.   fluticasone  (FLONASE ) 50 MCG/ACT nasal spray SPRAY 2 SPRAYS INTO EACH NOSTRIL EVERY DAY (Patient taking differently: Place 2 sprays into both nostrils daily as needed for allergies  or rhinitis.)   fluticasone  (FLONASE ) 50 MCG/ACT nasal spray Place 2 sprays into both nostrils daily.   ivabradine  (CORLANOR) 7.5 MG TABS tablet Take 2 tablets (15 mg total) by mouth as directed. Take 2 hours before your CT   lansoprazole (PREVACID) 30 MG capsule Take 30 mg by mouth daily as needed (Indigestion).   levocetirizine (XYZAL ) 5 MG tablet Take 5 mg by mouth daily as needed for allergies.   pantoprazole  (PROTONIX ) 40 MG tablet Take 1 tablet (40 mg total) by mouth daily.   SUMAtriptan  (IMITREX ) 50 MG tablet Take 1 tablet (50 mg total) by mouth every 2 (two) hours as needed for migraine. May repeat in 2 hours if headache persists or recurs.   vitamin B-12 (CYANOCOBALAMIN ) 100 MCG tablet Take 100 mcg by mouth daily.     Allergies:   Promethazine hcl, Azithromycin, Cephalexin , Codeine, Dilaudid [hydromorphone], and Soy allergy (obsolete)   Social History   Socioeconomic History   Marital status: Married    Spouse name: Not on file   Number of children: 2   Years of education: Not on file   Highest education level: Not on file  Occupational History   Occupation: homemaker  Tobacco Use   Smoking status: Never   Smokeless tobacco: Never   Tobacco comments:    father smoked in house growing up per pt.   Vaping Use   Vaping status: Never Used  Substance and Sexual Activity   Alcohol use: Yes    Alcohol/week: 0.0 standard drinks of alcohol    Comment: 12-23-16 "I'll have 1-2 drinks maybe 3-4 times/yr"   Drug use: No   Sexual activity: Yes    Birth control/protection: Surgical  Other Topics Concern   Not on file  Social History Narrative   Daily caffiene use: 6 cups per day   Patient does not get regular excercise   Social Drivers of Health   Financial Resource Strain: Low Risk  (09/23/2022)   Received from [facility], [facility]   Overall Financial Resource Strain (CARDIA)    Difficulty of Paying Living Expenses: Not hard at all  Food Insecurity: No Food Insecurity  (09/23/2022)   Received from [facility], [facility]   Hunger Vital Sign    Worried About Running Out of Food in the Last Year: Never true    Ran Out of Food in the Last Year: Never true  Transportation Needs: No Transportation Needs (09/23/2022)   Received from [facility], [facility]   PRAPARE - Transportation    Lack of Transportation (Medical): No    Lack of Transportation (Non-Medical): No  Physical Activity: Not on file  Stress: Not on file  Social Connections: Unknown (10/18/2021)   [facility], [facility]   Social Network  Social Network: Not on file     Family History: The patient's family history includes Breast cancer in her paternal grandmother; Colon polyps in her father, paternal grandfather, and paternal grandmother; Diverticulitis in her mother; Heart disease in her father; Prostate cancer in her paternal grandfather. There is no history of Colon cancer, Esophageal cancer, Rectal cancer, Stomach cancer, or Pancreatic cancer. ROS:   Please see the history of present illness.    All 14 point review of systems negative except as described per history of present illness  EKGs/Labs/Other Studies Reviewed:         Recent Labs: 05/21/2023: BUN 9; Creatinine, Ser 0.92; Hemoglobin 13.2; Platelets 424; Potassium 3.9; Sodium 137 07/22/2023: ALT 17; TSH 2.30  Recent Lipid Panel    Component Value Date/Time   CHOL 164 10/14/2022 0958   TRIG 110.0 10/14/2022 0958   HDL 46.10 10/14/2022 0958   CHOLHDL 4 10/14/2022 0958   VLDL 22.0 10/14/2022 0958   LDLCALC 96 10/14/2022 0958    Physical Exam:    VS:  BP 116/80 (BP Location: Right Arm, Patient Position: Sitting)   Pulse 86   Ht 5\' 4"  (1.626 m)   Wt 217 lb (98.4 kg)   LMP 02/08/2013   SpO2 98%   BMI 37.25 kg/m     Wt Readings from Last 3 Encounters:  10/13/23 217 lb (98.4 kg)  10/07/23 219 lb 12.8 oz (99.7 kg)  08/27/23 233 lb (105.7 kg)     GEN:  Well nourished, well developed in  no acute distress HEENT: Normal NECK: No JVD; No carotid bruits LYMPHATICS: No lymphadenopathy CARDIAC: RRR, no murmurs, no rubs, no gallops RESPIRATORY:  Clear to auscultation without rales, wheezing or rhonchi  ABDOMEN: Soft, non-tender, non-distended MUSCULOSKELETAL:  No edema; No deformity  SKIN: Warm and dry LOWER EXTREMITIES: no swelling NEUROLOGIC:  Alert and oriented x 3 PSYCHIATRIC:  Normal affect   ASSESSMENT:    1. Mixed hyperlipidemia   2. Dyspnea on exertion   3. Coronary artery disease involving native coronary artery of native heart without angina pectoris    PLAN:    In order of problems listed above:  Coronary disease only mild disease in circumflex artery based on coronary CT angio.  Clearly not justifying her symptoms.  Continue antiplatelet therapy. Dyslipidemia I did review K PN which show me LDL 96 HDL 46.  This is from last year.  She is supposed to be taking statin however she did not do that.  Ask her to go back on Lipitor 10 and asked her to have fasting lipid profile done in about 6 weeks. Dyspnea exertion encouraged her to be more active we did talk about need to exercise 5 times a week 30 minutes moderate intensity exercise.   Medication Adjustments/Labs and Tests Ordered: Current medicines are reviewed at length with the patient today.  Concerns regarding medicines are outlined above.  No orders of the defined types were placed in this encounter.  Medication changes: No orders of the defined types were placed in this encounter.   Signed, Manfred Seed, MD, [facility] 10/13/2023 11:09 AM    [facility]

## 2023-10-13 NOTE — Addendum Note (Signed)
 Addended by: Shawnee Dellen D on: 10/13/2023 11:18 AM   Modules accepted: Orders

## 2023-10-14 DIAGNOSIS — F4323 Adjustment disorder with mixed anxiety and depressed mood: Secondary | ICD-10-CM | POA: Diagnosis not present

## 2023-10-26 DIAGNOSIS — F4323 Adjustment disorder with mixed anxiety and depressed mood: Secondary | ICD-10-CM | POA: Diagnosis not present

## 2023-11-04 DIAGNOSIS — F4323 Adjustment disorder with mixed anxiety and depressed mood: Secondary | ICD-10-CM | POA: Diagnosis not present

## 2023-11-09 ENCOUNTER — Ambulatory Visit (HOSPITAL_BASED_OUTPATIENT_CLINIC_OR_DEPARTMENT_OTHER)
Admission: RE | Admit: 2023-11-09 | Discharge: 2023-11-09 | Disposition: A | Discharge status: Home / Self Care | Source: Ambulatory Visit | Attending: Medical | Admitting: Medical

## 2023-11-09 ENCOUNTER — Ambulatory Visit: Payer: Self-pay

## 2023-11-09 ENCOUNTER — Ambulatory Visit (INDEPENDENT_AMBULATORY_CARE_PROVIDER_SITE_OTHER): Admitting: Medical

## 2023-11-09 ENCOUNTER — Ambulatory Visit: Payer: Self-pay | Admitting: Medical

## 2023-11-09 VITALS — BP 125/71 | HR 79 | Temp 97.9°F | Resp 12 | Ht 64.0 in | Wt 217.8 lb

## 2023-11-09 DIAGNOSIS — M79662 Pain in left lower leg: Secondary | ICD-10-CM | POA: Diagnosis not present

## 2023-11-09 DIAGNOSIS — M7122 Synovial cyst of popliteal space [Baker], left knee: Secondary | ICD-10-CM | POA: Diagnosis not present

## 2023-11-09 DIAGNOSIS — M5416 Radiculopathy, lumbar region: Secondary | ICD-10-CM | POA: Insufficient documentation

## 2023-11-09 DIAGNOSIS — G629 Polyneuropathy, unspecified: Secondary | ICD-10-CM

## 2023-11-09 DIAGNOSIS — M79671 Pain in right foot: Secondary | ICD-10-CM

## 2023-11-09 DIAGNOSIS — F4323 Adjustment disorder with mixed anxiety and depressed mood: Secondary | ICD-10-CM | POA: Diagnosis not present

## 2023-11-09 MED ORDER — DICLOFENAC SODIUM 75 MG PO TBEC: 75.0000 mg | DELAYED_RELEASE_TABLET | Freq: Two times a day (BID) | ORAL | 0 refills | Status: DC

## 2023-11-09 NOTE — Patient Instructions (Addendum)
 Lumbar back pain(Lumbar disc bulge with radiculopathy) Chronic low back pain with radiculopathy, consistent with MRI findings of degenerative changes and disc bulge at L4-5 and L5-S1. Symptoms suggest possible progression. - Order lumbar spine x-ray to assess interval changes compared to prior studies - Consider neurosurgeon referral for repeat MRI if symptoms persist. - Discuss diclofenac  for pain management with Tylenol .  Baker's cyst Baker's cyst with calf pain and tightness. Previous ultrasound indicated a probable 2.5 cm cyst. Current symptoms suggest possible increase in size or change. - Order ultrasound of left leg to evaluate Baker's cyst and rule out DVT. - Consider sports medicine referral if ultrasound confirms Baker's cyst and symptoms persist.  Rt foot pain with no fracture by prior xray. -after above work up may get opinion on foot pain when refer to sports medicine for probable baker cyst.  Follow up date to be determined after imaging review.

## 2023-11-09 NOTE — Progress Notes (Signed)
 Subjective:    Patient ID: Lorraine Conrad, female    DOB: 11-26-1970, 53 y.o.   MRN: [id number]  Lorraine Conrad is a 53 year old female who presents with pain in her lower extremities and back.  She experiences pain located just above her left ankle, which initially caused concern but has been improving slightly. The pain feels like a needle going through her calf and sometimes affects her big toe and the side of her foot. It is more pronounced when walking and is accompanied by a popping sensation in her ankle.  She has left calf pain has been present for about six weeks, and she had an ultrasound on April 23rd. She has a known Baker's cyst, which an orthopedist reportedly did not find when she attempted to aspirate it.  She has a history of lumbar pain with radiation down her leg, ongoing for about a year. The pain is described as constant and daily, particularly affecting her bottom, hip, and leg. A 2016 MRI showed bulging discs at L4-5 and L5-S1 with degenerative changes.  She experiences rt  foot pain, particularly in the middle of the night, affecting her gait. An x-ray in 2023 showed heel spurs. The foot pain is described as a balancing act when walking, affecting both feet simultaneously.  She is currently taking Tylenol  for pain relief.  She has not been taking any other medications for her pain.   Review of Systems  Constitutional:  Negative for chills, fatigue and fever.  Respiratory:  Negative for cough, chest tightness and wheezing.   Cardiovascular:  Negative for chest pain and palpitations.  Gastrointestinal:  Negative for abdominal pain, blood in stool and nausea.  Genitourinary:  Negative for dysuria and frequency.  Musculoskeletal:  Positive for back pain.       Calf pain and foot pain.  Neurological:  Negative for dizziness and headaches.       Radicular type pain from back to left lower ext.  Hematological:  Negative for adenopathy. Does not bruise/bleed easily.   Psychiatric/Behavioral:  Negative for behavioral problems and dysphoric mood.    Past Medical History:  Diagnosis Date   Allergy    SEASONAL   Anemia    Arthritis    "spine" (03/01/2014)   Asthma    Chronic lower back pain    Endometriosis    Erosive gastritis    External hemorrhoids    GERD (gastroesophageal reflux disease)    H/O shortness of breath 2014   Cardiopulmonary exercise test results   Headache    "probably monthly" (03/01/2014)   Migraine 1998; 02/2014   "this one's lasted 10 days straight" (03/01/2014)   OSA on CPAP    Ovarian cyst, left    Proctalgia fugax    Sleep apnea    on cpap   Thyroid  disease    thyroid  nodule     Social History   Socioeconomic History   Marital status: Married    Spouse name: Not on file   Number of children: 2   Years of education: Not on file   Highest education level: Not on file  Occupational History   Occupation: homemaker  Tobacco Use   Smoking status: Never   Smokeless tobacco: Never   Tobacco comments:    father smoked in house growing up per pt.   Vaping Use   Vaping status: Never Used  Substance and Sexual Activity   Alcohol use: Yes    Alcohol/week: 0.0 standard drinks  of alcohol    Comment: 12-23-16 "I'll have 1-2 drinks maybe 3-4 times/yr"   Drug use: No   Sexual activity: Yes    Birth control/protection: Surgical  Other Topics Concern   Not on file  Social History Narrative   Daily caffiene use: 6 cups per day   Patient does not get regular excercise   Social Drivers of Health   Financial Resource Strain: Low Risk  (09/23/2022)   Received from [facility], [facility]   Overall Financial Resource Strain (CARDIA)    Difficulty of Paying Living Expenses: Not hard at all  Food Insecurity: No Food Insecurity (09/23/2022)   Received from [facility], [facility]   Hunger Vital Sign    Worried About Running Out of Food in the Last Year: Never true    Ran Out of Food in the Last Year: Never true   Transportation Needs: No Transportation Needs (09/23/2022)   Received from [facility], [facility]   PRAPARE - Transportation    Lack of Transportation (Medical): No    Lack of Transportation (Non-Medical): No  Physical Activity: Not on file  Stress: Not on file  Social Connections: Unknown (10/18/2021)   Received from [facility], [facility]   Social Network    Social Network: Not on file  Intimate Partner Violence: Unknown (09/09/2021)   Received from [facility], [facility]   HITS    Physically Hurt: Not on file    Insult or Talk Down To: Not on file    Threaten Physical Harm: Not on file    Scream or Curse: Not on file    Past Surgical History:  Procedure Laterality Date   ABDOMINAL HYSTERECTOMY  04/2013   BREAST BIOPSY Bilateral 10/05/2000   BREAST BIOPSY Right 08/26/2019   Fibroadenoma   BREAST EXCISIONAL BIOPSY Left 1991   CESAREAN SECTION  2002; 2006   COLONOSCOPY     LAPAROSCOPIC OVARIAN CYSTECTOMY  ~ 1999   NASAL SEPTUM SURGERY  ~ 1991   OOPHORECTOMY Right 2015   TEMPOROMANDIBULAR JOINT SURGERY  ~ 1992   UPPER GASTROINTESTINAL ENDOSCOPY      Family History  Problem Relation Age of Onset   Diverticulitis Mother    Colon polyps Father    Heart disease Father    Colon polyps Paternal Grandmother    Breast cancer Paternal Grandmother    Prostate cancer Paternal Grandfather    Colon polyps Paternal Grandfather    Colon cancer Neg Hx    Esophageal cancer Neg Hx    Rectal cancer Neg Hx    Stomach cancer Neg Hx    Pancreatic cancer Neg Hx     Allergies  Allergen Reactions   Promethazine Hcl Other (See Comments)    Violent tremors    Azithromycin Rash    Head to toe rash   Cephalexin  Diarrhea and Itching   Codeine Other (See Comments)    Was a child when she took this.   Dilaudid [Hydromorphone] Nausea And Vomiting   Soy Allergy (Obsolete) Itching    Current Outpatient Medications on File Prior to Visit  Medication Sig Dispense Refill    acetaminophen  (TYLENOL ) 500 MG tablet Take 500-1,000 mg by mouth every 6 (six) hours as needed for moderate pain (pain score 4-6).     albuterol  (VENTOLIN  HFA) 108 (90 Base) MCG/ACT inhaler Inhale 2 puffs into the lungs every 6 (six) hours as needed for wheezing or shortness of breath. 8 g 2   aspirin  EC 81 MG  tablet Take 1 tablet (81 mg total) by mouth daily. Swallow whole.     atorvastatin  (LIPITOR) 10 MG tablet TAKE 1 TABLET BY MOUTH EVERY DAY 30 tablet 8   Azelastine  HCl 137 MCG/SPRAY SOLN PLACE 1 SPRAY INTO BOTH NOSTRILS 2 TIMES DAILY AS DIRECTED (Patient taking differently: Place 1 spray into both nostrils 2 (two) times daily as needed (Rhinitis).) 30 mL 2   budesonide -formoterol  (SYMBICORT ) 160-4.5 MCG/ACT inhaler Inhale 2 puffs into the lungs 2 (two) times daily. (Patient taking differently: Inhale 2 puffs into the lungs 2 (two) times daily as needed (sob).) 1 each 12   busPIRone  (BUSPAR ) 7.5 MG tablet TAKE 1 TABLET BY MOUTH 2 TIMES DAILY. 60 tablet 2   ciclopirox  (PENLAC ) 8 % solution Apply topically at bedtime. Apply over nail and surrounding skin. Apply daily over previous coat. After seven (7) days, may remove with alcohol and continue cycle. (Patient taking differently: Apply 1 Application topically at bedtime as needed (pain). Apply over nail and surrounding skin. Apply daily over previous coat. After seven (7) days, may remove with alcohol and continue cycle.) 6.6 mL 0   clonazePAM  (KLONOPIN ) 0.5 MG tablet Take 1 tablet (0.5 mg total) by mouth 2 (two) times daily as needed for anxiety. 8 tablet 0   cyclobenzaprine  (FLEXERIL ) 5 MG tablet Take 1 tablet (5 mg total) by mouth at bedtime. 3 tablet 0   fluticasone  (FLONASE ) 50 MCG/ACT nasal spray SPRAY 2 SPRAYS INTO EACH NOSTRIL EVERY DAY (Patient taking differently: Place 2 sprays into both nostrils daily as needed for allergies or rhinitis.) 16 mL 5   lansoprazole (PREVACID) 30 MG capsule Take 30 mg by mouth daily as needed (Indigestion).      levocetirizine (XYZAL ) 5 MG tablet Take 5 mg by mouth daily as needed for allergies.     pantoprazole  (PROTONIX ) 40 MG tablet Take 1 tablet (40 mg total) by mouth daily. 90 tablet 3   SUMAtriptan  (IMITREX ) 50 MG tablet Take 1 tablet (50 mg total) by mouth every 2 (two) hours as needed for migraine. May repeat in 2 hours if headache persists or recurs. 10 tablet 0   vitamin B-12 (CYANOCOBALAMIN ) 100 MCG tablet Take 100 mcg by mouth daily.     No current facility-administered medications on file prior to visit.    BP 125/71 (BP Location: Right Arm, Patient Position: Sitting, Cuff Size: Large)   Pulse 79   Temp 97.9 F (36.6 C) (Oral)   Resp 12   Ht 5\' 4"  (1.626 m)   Wt 217 lb 12.8 oz (98.8 kg)   LMP 02/08/2013   SpO2 99%   BMI 37.39 kg/m        Objective:   Physical Exam  General Appearance- Not in acute distress.    Chest and Lung Exam Auscultation: Breath sounds:-Normal. Clear even and unlabored. Adventitious sounds:- No Adventitious sounds.  Cardiovascular Auscultation:Rythm - Regular, rate and rythm. Heart Sounds -Normal heart sounds.  Abdomen Inspection:-Inspection Normal.  Palpation/Perucssion: Palpation and Percussion of the abdomen reveal- Non Tender, No Rebound tenderness, No rigidity(Guarding) and No Palpable abdominal masses.  Liver:-Normal.  Spleen:- Normal.   Back Mid lumbar spine tenderness to palpation. Pain on lateral movements and flexion/extension of the spine.  Lower ext neurologic  L5-S1 sensation intact bilaterally. Normal patellar reflexes bilaterally. No foot drop bilaterally.   Left lower ext- calf not swollen. No pedal edema. Negative homans sign. Mid calf tenderness to palpation.   Rt foot- no swelling. No deformity. On palpation of toes  and metarsal no pain presently.      Assessment & Plan:   Patient Instructions  Lumbar back pain(Lumbar disc bulge with radiculopathy) Chronic low back pain with radiculopathy, consistent with  MRI findings of degenerative changes and disc bulge at L4-5 and L5-S1. Symptoms suggest possible progression. - Order lumbar spine x-ray to assess interval changes compared to prior studies - Consider neurosurgeon referral for repeat MRI if symptoms persist. - Discuss diclofenac  for pain management with Tylenol .  Baker's cyst Baker's cyst with calf pain and tightness. Previous ultrasound indicated a probable 2.5 cm cyst. Current symptoms suggest possible increase in size or change. - Order ultrasound of left leg to evaluate Baker's cyst and rule out DVT. - Consider sports medicine referral if ultrasound confirms Baker's cyst and symptoms persist.  Rt foot pain with no fracture by prior xray. -after above work up may get opinion on foot pain when refer to sports medicine for probable baker cyst.  Follow up date to be determined after imaging review.   Kalika Smay, PA-C

## 2023-11-09 NOTE — Telephone Encounter (Signed)
 Copied from CRM [id number]. Topic: Clinical - Red Word Triage >> Nov 09, 2023  5:35 PM Magdalene School wrote: Red Word that prompted transfer to Nurse Triage: leg pain when walking   Patient was seen today and was sent for an x-ray. Patient states that she is continuing to have pain and wanted to know if she could get a Toradol  injection for her pain. Patient understands that the office is closed at this time but would like someone to contact her about coming in for an injection tomorrow, 6/3, if possible. Please advise.   Reason for Disposition  [1] Caller has medicine question about med NOT prescribed by PCP AND [2] triager unable to answer question (e.g., compatibility with other med, storage)  Answer Assessment - Initial Assessment Questions 1. NAME of MEDICINE: "What medicine(s) are you calling about?"     Toradol   2. QUESTION: "What is your question?" (e.g., double dose of medicine, side effect)     Would like an injection  Protocols used: Medication Question Call-A-AH

## 2023-11-10 ENCOUNTER — Ambulatory Visit: Payer: Self-pay | Admitting: *Deleted

## 2023-11-10 MED ORDER — PREDNISONE 10 MG (21) PO TBPK: ORAL_TABLET | ORAL | 0 refills | Status: DC

## 2023-11-10 NOTE — Telephone Encounter (Signed)
  Chief Complaint: left leg pain worsening.  Symptoms: left leg pain from buttocks to back of thigh side of leg to calf. Can walk . Did take voltaren  medication x 1 with minimal relief.  Frequency: yesterday  Pertinent Negatives: Patient denies leg swelling no redness no chest pain no difficulty breathing no N/T Disposition: [] ED /[] Urgent Care (no appt availability in office) / [] Appointment(In office/virtual)/ []  [facility]/ [] Home Care/ [] Refused Recommended Disposition /[] [place] [facility]/ [x]  Follow-up with PCP Additional Notes:    Patient requesting if PCP would give "shot" Toradol  to help her get through party for daughter for graduation today . Offered appt with other provider and patient declined. None available with PCP until tomorrow. Recommended if pain worsens go to UC/ED. Patient requesting if PCP will see her today .   Copied from CRM [id number]. Topic: Clinical - Red Word Triage >> Nov 10, 2023 10:13 AM Elita Guitar wrote: Red Word that prompted transfer to Nurse Triage: leg pain, was triaged yesterday, sxs not improved. Reason for Disposition  [1] SEVERE pain (e.g., excruciating, unable to do any normal activities) AND [2] not improved after 2 hours of pain medicine  Answer Assessment - Initial Assessment Questions 1. ONSET: "When did the pain start?"      Worsening since yesterday  2. LOCATION: "Where is the pain located?"      Left leg esp. Calf and side of leg  3. PAIN: "How bad is the pain?"    (Scale 1-10; or mild, moderate, severe)   -  MILD (1-3): doesn't interfere with normal activities    -  MODERATE (4-7): interferes with normal activities (e.g., work or school) or awakens from sleep, limping    -  SEVERE (8-10): excruciating pain, unable to do any normal activities, unable to walk     Pain buttocks , thigh, and leg 4. WORK OR EXERCISE: "Has there been any recent work or exercise that involved this part of the body?"      na 5. CAUSE: "What do you  think is causing the leg pain?"     Not sure  6. OTHER SYMPTOMS: "Do you have any other symptoms?" (e.g., chest pain, back pain, breathing difficulty, swelling, rash, fever, numbness, weakness)     Sitting causes worsening pain . Burning pain across hips and down back of leg. 7. PREGNANCY: "Is there any chance you are pregnant?" "When was your last menstrual period?"     na  Protocols used: Leg Pain-A-AH

## 2023-11-10 NOTE — Addendum Note (Signed)
 Addended by: Serafina Damme on: 11/10/2023 11:47 AM   Modules accepted: Orders

## 2023-11-20 ENCOUNTER — Ambulatory Visit: Admitting: Internal Medicine

## 2023-11-24 DIAGNOSIS — F4323 Adjustment disorder with mixed anxiety and depressed mood: Secondary | ICD-10-CM | POA: Diagnosis not present

## 2023-11-25 ENCOUNTER — Ambulatory Visit: Admitting: Internal Medicine

## 2023-11-25 ENCOUNTER — Encounter: Payer: Self-pay | Admitting: Internal Medicine

## 2023-11-25 VITALS — BP 128/84 | HR 78 | Ht 63.0 in | Wt 219.4 lb

## 2023-11-25 DIAGNOSIS — R140 Abdominal distension (gaseous): Secondary | ICD-10-CM

## 2023-11-25 DIAGNOSIS — R1011 Right upper quadrant pain: Secondary | ICD-10-CM

## 2023-11-25 DIAGNOSIS — R194 Change in bowel habit: Secondary | ICD-10-CM | POA: Diagnosis not present

## 2023-11-25 DIAGNOSIS — R1310 Dysphagia, unspecified: Secondary | ICD-10-CM | POA: Diagnosis not present

## 2023-11-25 DIAGNOSIS — K219 Gastro-esophageal reflux disease without esophagitis: Secondary | ICD-10-CM

## 2023-11-25 DIAGNOSIS — K449 Diaphragmatic hernia without obstruction or gangrene: Secondary | ICD-10-CM

## 2023-11-25 MED ORDER — FAMOTIDINE 20 MG PO TABS: 20.0000 mg | ORAL_TABLET | Freq: Two times a day (BID) | ORAL | 3 refills | Status: DC | PRN

## 2023-11-25 NOTE — Progress Notes (Signed)
 Chief Complaint: Change in bowel habits  HPI : 53 year old female with history of arthritis, asthma, endometriosis, migraine, GERD, and OSA on CPAP presents for follow up of changes in bowel habits  Interval History: Dysphagia has improved after esophageal dilation. Every once in a while she will still feel a little choked. Patient did not try Protonix  due to concerns about dementia. She only feels some reflux symptoms on occasion. Her thin stools have improved after the colonoscopy procedure. Bowel habits are now normal. Denies abdominal pain like she was feeling previously. She now just has some ab pain right before having a BM. Denies blood in the stools. Bloating is not bad. She has been eating healthier.  Past Medical History:  Diagnosis Date   Allergy    SEASONAL   Anemia    Arthritis    spine (03/01/2014)   Asthma    Chronic lower back pain    Endometriosis    Erosive gastritis    External hemorrhoids    GERD (gastroesophageal reflux disease)    H/O shortness of breath 2014   Cardiopulmonary exercise test results   Headache    probably monthly (03/01/2014)   Migraine 1998; 02/2014   this one's lasted 10 days straight (03/01/2014)   OSA on CPAP    Ovarian cyst, left    Proctalgia fugax    Sleep apnea    on cpap   Thyroid  disease    thyroid  nodule     Past Surgical History:  Procedure Laterality Date   ABDOMINAL HYSTERECTOMY  04/2013   BREAST BIOPSY Bilateral 10/05/2000   BREAST BIOPSY Right 08/26/2019   Fibroadenoma   BREAST EXCISIONAL BIOPSY Left 1991   CESAREAN SECTION  2002; 2006   COLONOSCOPY     LAPAROSCOPIC OVARIAN CYSTECTOMY  ~ 1999   NASAL SEPTUM SURGERY  ~ 1991   OOPHORECTOMY Right 2015   TEMPOROMANDIBULAR JOINT SURGERY  ~ 1992   UPPER GASTROINTESTINAL ENDOSCOPY     Family History  Problem Relation Age of Onset   Diverticulitis Mother    Colon polyps Father    Heart disease Father    Colon polyps Paternal Grandmother    Breast cancer  Paternal Grandmother    Prostate cancer Paternal Grandfather    Colon polyps Paternal Grandfather    Colon cancer Neg Hx    Esophageal cancer Neg Hx    Rectal cancer Neg Hx    Stomach cancer Neg Hx    Pancreatic cancer Neg Hx    Social History   Tobacco Use   Smoking status: Never   Smokeless tobacco: Never   Tobacco comments:    father smoked in house growing up per pt.   Vaping Use   Vaping status: Never Used  Substance Use Topics   Alcohol use: Yes    Alcohol/week: 0.0 standard drinks of alcohol    Comment: 12-23-16 I'll have 1-2 drinks maybe 3-4 times/yr   Drug use: No   Current Outpatient Medications  Medication Sig Dispense Refill   acetaminophen  (TYLENOL ) 500 MG tablet Take 500-1,000 mg by mouth every 6 (six) hours as needed for moderate pain (pain score 4-6).     albuterol  (VENTOLIN  HFA) 108 (90 Base) MCG/ACT inhaler Inhale 2 puffs into the lungs every 6 (six) hours as needed for wheezing or shortness of breath. 8 g 2   aspirin  EC 81 MG tablet Take 1 tablet (81 mg total) by mouth daily. Swallow whole.     atorvastatin  (LIPITOR) 10 MG  tablet TAKE 1 TABLET BY MOUTH EVERY DAY 30 tablet 8   Azelastine  HCl 137 MCG/SPRAY SOLN PLACE 1 SPRAY INTO BOTH NOSTRILS 2 TIMES DAILY AS DIRECTED (Patient taking differently: Place 1 spray into both nostrils 2 (two) times daily as needed (Rhinitis).) 30 mL 2   budesonide -formoterol  (SYMBICORT ) 160-4.5 MCG/ACT inhaler Inhale 2 puffs into the lungs 2 (two) times daily. (Patient taking differently: Inhale 2 puffs into the lungs 2 (two) times daily as needed (sob).) 1 each 12   busPIRone  (BUSPAR ) 7.5 MG tablet TAKE 1 TABLET BY MOUTH 2 TIMES DAILY. 60 tablet 2   ciclopirox  (PENLAC ) 8 % solution Apply topically at bedtime. Apply over nail and surrounding skin. Apply daily over previous coat. After seven (7) days, may remove with alcohol and continue cycle. (Patient taking differently: Apply 1 Application topically at bedtime as needed (pain). Apply  over nail and surrounding skin. Apply daily over previous coat. After seven (7) days, may remove with alcohol and continue cycle.) 6.6 mL 0   clonazePAM  (KLONOPIN ) 0.5 MG tablet Take 1 tablet (0.5 mg total) by mouth 2 (two) times daily as needed for anxiety. 8 tablet 0   cyclobenzaprine  (FLEXERIL ) 5 MG tablet Take 1 tablet (5 mg total) by mouth at bedtime. 3 tablet 0   fluticasone  (FLONASE ) 50 MCG/ACT nasal spray SPRAY 2 SPRAYS INTO EACH NOSTRIL EVERY DAY (Patient taking differently: Place 2 sprays into both nostrils daily as needed for allergies or rhinitis.) 16 mL 5   lansoprazole (PREVACID) 30 MG capsule Take 30 mg by mouth daily as needed (Indigestion).     levocetirizine (XYZAL ) 5 MG tablet Take 5 mg by mouth daily as needed for allergies.     pantoprazole  (PROTONIX ) 40 MG tablet Take 1 tablet (40 mg total) by mouth daily. 90 tablet 3   predniSONE  (STERAPRED UNI-PAK 21 TAB) 10 MG (21) TBPK tablet Taper over 6 days. 21 tablet 0   SUMAtriptan  (IMITREX ) 50 MG tablet Take 1 tablet (50 mg total) by mouth every 2 (two) hours as needed for migraine. May repeat in 2 hours if headache persists or recurs. 10 tablet 0   vitamin B-12 (CYANOCOBALAMIN ) 100 MCG tablet Take 100 mcg by mouth daily.     No current facility-administered medications for this visit.   Allergies  Allergen Reactions   Promethazine Hcl Other (See Comments)    Violent tremors    Azithromycin Rash    Head to toe rash   Cephalexin  Diarrhea and Itching   Codeine Other (See Comments)    Was a child when she took this.   Dilaudid [Hydromorphone] Nausea And Vomiting   Soy Allergy (Obsolete) Itching    Physical Exam: BP 128/84   Pulse 78   Ht 5' 3 (1.6 m)   Wt 219 lb 6 oz (99.5 kg)   LMP 02/08/2013   SpO2 98%   BMI 38.86 kg/m  Constitutional: Pleasant,well-developed, female in no acute distress. HEENT: Normocephalic and atraumatic. Conjunctivae are normal. No scleral icterus. Cardiovascular: Normal rate, regular rhythm.   Pulmonary/chest: Effort normal and breath sounds normal. No wheezing, rales or rhonchi. Abdominal: Soft, nondistended, tender to RUQ Extremities: No edema Neurological: Alert and oriented to person place and time. Skin: Skin is warm and dry. No rashes noted. Psychiatric: Normal mood and affect. Behavior is normal.  Labs 05/2023: CBC nml. BMP unremarkable.  Labs 07/2023: LFTs nml. Lipase nml. CRP nml. TSH nml.  CT A/P w/contrast 05/20/18: IMPRESSION: 1. No bowel obstruction. No evident diverticulitis. No abscess  in the abdomen or pelvis. Appendix appears normal. 2.  No evident renal or ureteral calculus.  No hydronephrosis. 3.  Minimal ventral hernia containing only fat. 4.  Uterus absent. Comment: A cause for patient's symptoms has not been established with this study.  CT A/P w/contrast 07/28/23: IMPRESSION: 1. No acute abnormality in the abdomen or pelvis. 2. Scattered colonic diverticulosis without findings of acute diverticulitis. 3. Tiny fat containing umbilical hernia.  EGD 02/25/17: - No endoscopic esophageal abnormality to explain patient' s dysphagia. Esophagus appeared normal. Esophagus dilated. - Small hiatal hernia, otherwise normal stomach. - Normal duodenal bulb and second portion of the duodenum. - No specimens collected.  Colonoscopy 03/25/17: - Perianal skin tag found on perianal exam. - Internal hemorrhoids. - The examination was otherwise normal on direct and retroflexion views. - No specimens collected.  EGD 08/27/23: - Non- obstructing Schatzki ring. - Small hiatal hernia. - Erythematous mucosa in the antrum. Biopsied. - Duodenitis. Biopsied. - Biopsies were taken with a cold forceps for histology in the proximal esophagus and in the distal esophagus. - Dilation performed in the entire esophagus. Path: [facility] [P], gastric :       -  ANTRAL AND OXYNTIC MUCOSA WITH MILD CHRONIC INACTIVE GASTRITIS.       -  NO HELICOBACTER PYLORI ORGANISMS IDENTIFIED ON H&E  STAINED SLIDE.      2. Surgical [P], distal esophagus :       -  SQUAMOUS MUCOSA WITH MILD BASAL CELL HYPERPLASIA AND MINIMALLY INCREASED       INTRAEPITHELIAL LYMPHOCYTES, NONSPECIFIC.       -  A PAS STAIN WAS NEGATIVE.       3. Surgical [P], proximal esophagus :       -  SQUAMOUS MUCOSA WITH MILD BASAL CELL HYPERPLASIA, NONSPECIFIC.   Colonoscopy 08/27/23: - The examined portion of the ileum was normal. - One 3 mm polyp in the sigmoid colon, removed with a cold snare. Resected and retrieved. - Diverticulosis in the sigmoid colon. - Non- bleeding internal hemorrhoids. Path: 4. Surgical [P], colon, sigmoid, polyp (1) :      -  COLONIC MUCOSA WITH HYPERPLASTIC CHANGE AND A PROMINENT LYMPHOID AGGREGATE.   ASSESSMENT AND PLAN: Thin stools - resolved Change in bowel habits Occasional rectal bleeding - resolved Bloating RUQ ab pain - improved Dysphagia - improved Small hiatal hernia Patient states that her stools have now normalized after some dietary changes after her colonoscopy, which may have been related to her bowel prep. I asked her to start a daily fiber supplement and to continue the dietary changes that she instituted to try maintain regular BMs. She does have some occasional GERD but did not want to use PPIs due to concerns about the side effect of dementia. Thus will have her use Pepcid PRN instead to help with any uncontrolled acid reflux. Patient will return to clinic if her symptoms were to worsen again. - Start Pepcid 20 mg BID PRN for acid reflux - Continue dietary changes - Start daily fiber supplement - Next colonoscopy due in 08/2033 for colon cancer sceening - RTC PRN  Regino Caprio, MD  I spent 35 minutes of time, including in depth chart review, independent review of results as outlined above, communicating results with the patient directly, face-to-face time with the patient, coordinating care, ordering studies and medications as appropriate, and documentation.

## 2023-11-25 NOTE — Patient Instructions (Addendum)
 We have sent the following medications to your pharmacy for you to pick up at your convenience: Pepcid  Follow up as needed   Start a fiber supplement such as Benefiber, Citrucel, or  Metamucil.  Take 1 tablespoon once or twice daily can be used to keep bowels regular if needed.    If your blood pressure at your visit was 140/90 or greater, please contact your primary care physician to follow up on this.  _______________________________________________________  If you are age 64 or older, your body mass index should be between 23-30. Your Body mass index is 38.86 kg/m. If this is out of the aforementioned range listed, please consider follow up with your Primary Care Provider.  If you are age 25 or younger, your body mass index should be between 19-25. Your Body mass index is 38.86 kg/m. If this is out of the aformentioned range listed, please consider follow up with your Primary Care Provider.   ________________________________________________________  The [facility] providers would like to encourage you to use MYCHART to communicate with providers for non-urgent requests or questions.  Due to long hold times on the telephone, sending your provider a message by [facility] may be a faster and more efficient way to get a response.  Please allow 48 business hours for a response.  Please remember that this is for non-urgent requests.  _______________________________________________________  Thank you for entrusting me with your care and for choosing [facility], Dr. Regino Caprio

## 2023-12-29 DIAGNOSIS — F4323 Adjustment disorder with mixed anxiety and depressed mood: Secondary | ICD-10-CM | POA: Diagnosis not present

## 2024-01-18 DIAGNOSIS — F4323 Adjustment disorder with mixed anxiety and depressed mood: Secondary | ICD-10-CM | POA: Diagnosis not present

## 2024-03-23 DIAGNOSIS — M79672 Pain in left foot: Secondary | ICD-10-CM | POA: Diagnosis not present

## 2024-03-23 DIAGNOSIS — M25572 Pain in left ankle and joints of left foot: Secondary | ICD-10-CM | POA: Diagnosis not present

## 2024-03-24 ENCOUNTER — Other Ambulatory Visit: Payer: Self-pay | Admitting: Student

## 2024-03-24 DIAGNOSIS — M25572 Pain in left ankle and joints of left foot: Secondary | ICD-10-CM

## 2024-03-28 ENCOUNTER — Inpatient Hospital Stay: Admission: RE | Admit: 2024-03-28 | Discharge: 2024-03-28 | Attending: Student

## 2024-03-28 DIAGNOSIS — M25572 Pain in left ankle and joints of left foot: Secondary | ICD-10-CM | POA: Diagnosis not present

## 2024-03-31 DIAGNOSIS — Z1231 Encounter for screening mammogram for malignant neoplasm of breast: Secondary | ICD-10-CM | POA: Diagnosis not present

## 2024-03-31 LAB — HM MAMMOGRAPHY

## 2024-04-01 ENCOUNTER — Telehealth: Payer: Self-pay

## 2024-04-01 ENCOUNTER — Telehealth: Payer: Self-pay | Admitting: Medical

## 2024-04-01 NOTE — Telephone Encounter (Signed)
 Copied from CRM [id number]. Topic: Appointments - Scheduling Inquiry for Clinic >> Apr 01, 2024 10:29 AM Lorraine Conrad wrote: Reason for CRM: Patient called in wanting to be seen today by Dr. Saguier for a Toradol  injection. Is requesting a call back to let her know if she can be worked in she can be reached at [phone].  I do not see anything saying she needs to get a Toradol  injection. Is this something ok to schedule for her?

## 2024-04-01 NOTE — Telephone Encounter (Signed)
 Copied from CRM [id number]. Topic: General - Other >> Apr 01, 2024 12:05 PM Dedra B wrote: Reason for CRM: Pt said [facility] left message for her to call and ask for Luke Callas regarding earlier available appt at [facility] location.

## 2024-04-01 NOTE — Telephone Encounter (Signed)
 Got pt scheduled for Monday 10/27

## 2024-04-04 ENCOUNTER — Ambulatory Visit (HOSPITAL_BASED_OUTPATIENT_CLINIC_OR_DEPARTMENT_OTHER)
Admission: RE | Admit: 2024-04-04 | Discharge: 2024-04-04 | Disposition: A | Discharge status: Home / Self Care | Source: Ambulatory Visit | Attending: Medical | Admitting: Medical

## 2024-04-04 ENCOUNTER — Ambulatory Visit: Payer: Self-pay | Admitting: Medical

## 2024-04-04 ENCOUNTER — Ambulatory Visit: Admitting: Medical

## 2024-04-04 VITALS — BP 128/78 | HR 76 | Temp 98.2°F | Resp 15 | Ht 63.0 in | Wt 235.0 lb

## 2024-04-04 DIAGNOSIS — M25572 Pain in left ankle and joints of left foot: Secondary | ICD-10-CM | POA: Diagnosis not present

## 2024-04-04 DIAGNOSIS — F4323 Adjustment disorder with mixed anxiety and depressed mood: Secondary | ICD-10-CM | POA: Diagnosis not present

## 2024-04-04 DIAGNOSIS — R739 Hyperglycemia, unspecified: Secondary | ICD-10-CM

## 2024-04-04 DIAGNOSIS — M2550 Pain in unspecified joint: Secondary | ICD-10-CM

## 2024-04-04 DIAGNOSIS — M5416 Radiculopathy, lumbar region: Secondary | ICD-10-CM

## 2024-04-04 DIAGNOSIS — M546 Pain in thoracic spine: Secondary | ICD-10-CM | POA: Diagnosis not present

## 2024-04-04 DIAGNOSIS — M47814 Spondylosis without myelopathy or radiculopathy, thoracic region: Secondary | ICD-10-CM | POA: Diagnosis not present

## 2024-04-04 LAB — HEMOGLOBIN A1C: Hgb A1c MFr Bld: 5.8 % (ref 4.6–6.5)

## 2024-04-04 MED ORDER — METHYLPREDNISOLONE 4 MG PO TABS: ORAL_TABLET | ORAL | 0 refills | Status: DC

## 2024-04-04 MED ORDER — KETOROLAC TROMETHAMINE 30 MG/ML IJ SOLN
30.0000 mg | Freq: Once | INTRAMUSCULAR | Status: AC
  Administered 2024-04-04: 30 mg via INTRAMUSCULAR

## 2024-04-04 MED ORDER — CYCLOBENZAPRINE HCL 5 MG PO TABS: ORAL_TABLET | ORAL | 0 refills | Status: AC

## 2024-04-04 NOTE — Patient Instructions (Signed)
 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 Pain localized at ankle-foot junction, talus area. No MRI planned per orthopedist. - Follow up with orthopedist on Wednesday for further evaluation and management.  Thoracic pain with muscle spasms Chronic thoracic pain with muscle spasms, likely musculoskeletal due to altered gait from boot use. Previous MRI normal. - Prescribe Flexeril  5 mg at night for muscle spasms. - Order thoracic spine x-ray. - Administer Toradol  30 mg injection today. - Prescribe Medrol  4 mg, 6-day taper, starting tomorrow.  Chronic low back pain with intermittent radiculopathy Chronic low back pain with intermittent radiculopathy. Previous MRI showed L4-L5 disc bulge without significant stenosis. Recent symptoms include burning and intermittent leg pain. - Continue diclofenac  as needed, up to twice daily. - Monitor for constant low back pain with radiculopathy; consider neurosurgeon referral and possible repeat MRI if symptoms persist.  Left popliteal cyst Left popliteal cyst, 1.3 cm, too small for drainage byr reported hx when went to specialist. No DVT on repeated ultrasounds. Symptoms include discomfort and need for leg elevation. - Monitor for asymmetric swelling, increased pain, or shortness of breath; perform ultrasound to rule out DVT if symptoms present. If any severe symptom ever to occur be seen in the ED.  Follow up 10-14 days or sooner if needed

## 2024-04-04 NOTE — Progress Notes (Signed)
 Subjective:    Patient ID: Lorraine Conrad, female    DOB: 06-19-1970, 53 y.o.   MRN: [id number]  HPI  Lorraine Conrad is a 53 year old female with chroniic left anlke and back pain who presents with worsening upper back spasms, lumbar back pain  and foot pain.  She experiences severe spasms across her upper back, which are intense enough to take her breath away. These spasms occur several days a week, including weekends, and are more severe than her lower back pain, which has been less intense recently.  She has chronic left ankle issues, which she believes may be related to her back pain. She describes a sensation in her ankle  that makes her feel like she is going to fall, prompting her to wear a boot. This issue has persisted for nearly ten years. A recent CT scan of her foot/ankle showed broadening of the process of the talus at the navicular with osteophytosis and a small plantar calcaneal spur. She has a follow-up appointment with an orthopedist scheduled for Wednesday.  She reports burning in her lower back, which she associates with wearing the boot, as it seems to pull on her back. This has caused difficulty in walking and climbing stairs, although she is currently managing better. A lumbar MRI in 2016 showed a disc bulge at L4 and L5 without significant stenosis, and a thoracic MRI in 2016 was normal.  She experiences intermittent pain radiating from her lower back down her leg, which was present over the weekend but is not constant. She has a history of a Baker's cyst in the back of her  left knee, which has been too small to drain. Repeated ultrasounds have shown no evidence of DVT.  For pain management, she takes diclofenac , which she finds somewhat helpful. She has previously taken ibuprofen  and Aleve  but discontinued them due to side effects.     Review of Systems  Constitutional:  Negative for chills and fever.  Respiratory:  Negative for cough and choking.   Cardiovascular:   Negative for chest pain and palpitations.  Gastrointestinal:  Negative for abdominal pain.  Genitourinary:  Negative for frequency.  Musculoskeletal:  Positive for back pain.  Neurological:  Negative for syncope, facial asymmetry, weakness and numbness.  Hematological:  Negative for adenopathy.   Past Medical History:  Diagnosis Date   Allergy    SEASONAL   Anemia    Arthritis    spine (03/01/2014)   Asthma    Chronic lower back pain    Endometriosis    Erosive gastritis    External hemorrhoids    GERD (gastroesophageal reflux disease)    H/O shortness of breath 2014   Cardiopulmonary exercise test results   Headache    probably monthly (03/01/2014)   Migraine 1998; 02/2014   this one's lasted 10 days straight (03/01/2014)   OSA on CPAP    Ovarian cyst, left    Proctalgia fugax    Sleep apnea    on cpap   Thyroid  disease    thyroid  nodule     Social History   Socioeconomic History   Marital status: Married    Spouse name: Not on file   Number of children: 2   Years of education: Not on file   Highest education level: Not on file  Occupational History   Occupation: homemaker  Tobacco Use   Smoking status: Never   Smokeless tobacco: Never   Tobacco comments:    father smoked  in house growing up per pt.   Vaping Use   Vaping status: Never Used  Substance and Sexual Activity   Alcohol use: Yes    Alcohol/week: 0.0 standard drinks of alcohol    Comment: 12-23-16 I'll have 1-2 drinks maybe 3-4 times/yr   Drug use: No   Sexual activity: Yes    Birth control/protection: Surgical  Other Topics Concern   Not on file  Social History Narrative   Daily caffiene use: 6 cups per day   Patient does not get regular excercise   Social Drivers of Health   Financial Resource Strain: Low Risk  (09/23/2022)   Received from [facility]   Overall Financial Resource Strain (CARDIA)    Difficulty of Paying Living Expenses: Not hard at all  Food Insecurity: No Food  Insecurity (09/23/2022)   Received from [facility]   Hunger Vital Sign    Within the past 12 months, you worried that your food would run out before you got the money to buy more.: Never true    Within the past 12 months, the food you bought just didn't last and you didn't have money to get more.: Never true  Transportation Needs: No Transportation Needs (09/23/2022)   Received from [facility] - Transportation    Lack of Transportation (Medical): No    Lack of Transportation (Non-Medical): No  Physical Activity: Not on file  Stress: Not on file  Social Connections: Unknown (10/18/2021)   Received from [facility]   Social Network    Social Network: Not on file  Intimate Partner Violence: Unknown (09/09/2021)   Received from [facility]   HITS    Physically Hurt: Not on file    Insult or Talk Down To: Not on file    Threaten Physical Harm: Not on file    Scream or Curse: Not on file    Past Surgical History:  Procedure Laterality Date   ABDOMINAL HYSTERECTOMY  04/2013   BREAST BIOPSY Bilateral 10/05/2000   BREAST BIOPSY Right 08/26/2019   Fibroadenoma   BREAST EXCISIONAL BIOPSY Left 1991   CESAREAN SECTION  2002; 2006   COLONOSCOPY     LAPAROSCOPIC OVARIAN CYSTECTOMY  ~ 1999   NASAL SEPTUM SURGERY  ~ 1991   OOPHORECTOMY Right 2015   TEMPOROMANDIBULAR JOINT SURGERY  ~ 1992   UPPER GASTROINTESTINAL ENDOSCOPY      Family History  Problem Relation Age of Onset   Diverticulitis Mother    Colon polyps Father    Heart disease Father    Colon polyps Paternal Grandmother    Breast cancer Paternal Grandmother    Prostate cancer Paternal Grandfather    Colon polyps Paternal Grandfather    Colon cancer Neg Hx    Esophageal cancer Neg Hx    Rectal cancer Neg Hx    Stomach cancer Neg Hx    Pancreatic cancer Neg Hx     Allergies  Allergen Reactions   Promethazine Hcl Other (See Comments)    Violent tremors    Azithromycin Rash    Head to toe rash    Cephalexin  Diarrhea and Itching   Codeine Other (See Comments)    Was a child when she took this.   Dilaudid [Hydromorphone] Nausea And Vomiting   Soy Allergy (Obsolete) Itching    Current Outpatient Medications on File Prior to Visit  Medication Sig Dispense Refill   acetaminophen  (TYLENOL ) 500 MG tablet Take 500-1,000 mg by mouth every 6 (six) hours as  needed for moderate pain (pain score 4-6).     albuterol  (VENTOLIN  HFA) 108 (90 Base) MCG/ACT inhaler Inhale 2 puffs into the lungs every 6 (six) hours as needed for wheezing or shortness of breath. 8 g 2   aspirin  EC 81 MG tablet Take 1 tablet (81 mg total) by mouth daily. Swallow whole.     atorvastatin  (LIPITOR) 10 MG tablet TAKE 1 TABLET BY MOUTH EVERY DAY 30 tablet 8   Azelastine  HCl 137 MCG/SPRAY SOLN PLACE 1 SPRAY INTO BOTH NOSTRILS 2 TIMES DAILY AS DIRECTED (Patient taking differently: Place 1 spray into both nostrils 2 (two) times daily as needed (Rhinitis).) 30 mL 2   budesonide -formoterol  (SYMBICORT ) 160-4.5 MCG/ACT inhaler Inhale 2 puffs into the lungs 2 (two) times daily. (Patient taking differently: Inhale 2 puffs into the lungs 2 (two) times daily as needed (sob).) 1 each 12   busPIRone  (BUSPAR ) 7.5 MG tablet TAKE 1 TABLET BY MOUTH 2 TIMES DAILY. 60 tablet 2   ciclopirox  (PENLAC ) 8 % solution Apply topically at bedtime. Apply over nail and surrounding skin. Apply daily over previous coat. After seven (7) days, may remove with alcohol and continue cycle. (Patient taking differently: Apply 1 Application topically at bedtime as needed (pain). Apply over nail and surrounding skin. Apply daily over previous coat. After seven (7) days, may remove with alcohol and continue cycle.) 6.6 mL 0   clonazePAM  (KLONOPIN ) 0.5 MG tablet Take 1 tablet (0.5 mg total) by mouth 2 (two) times daily as needed for anxiety. 8 tablet 0   cyclobenzaprine  (FLEXERIL ) 5 MG tablet Take 1 tablet (5 mg total) by mouth at bedtime. 3 tablet 0   famotidine  (PEPCID ) 20  MG tablet Take 1 tablet (20 mg total) by mouth 2 (two) times daily as needed for heartburn or indigestion. 60 tablet 3   fluticasone  (FLONASE ) 50 MCG/ACT nasal spray SPRAY 2 SPRAYS INTO EACH NOSTRIL EVERY DAY (Patient taking differently: Place 2 sprays into both nostrils daily as needed for allergies or rhinitis.) 16 mL 5   lansoprazole (PREVACID) 30 MG capsule Take 30 mg by mouth daily as needed (Indigestion).     levocetirizine (XYZAL ) 5 MG tablet Take 5 mg by mouth daily as needed for allergies.     pantoprazole  (PROTONIX ) 40 MG tablet Take 1 tablet (40 mg total) by mouth daily. 90 tablet 3   predniSONE  (STERAPRED UNI-PAK 21 TAB) 10 MG (21) TBPK tablet Taper over 6 days. 21 tablet 0   SUMAtriptan  (IMITREX ) 50 MG tablet Take 1 tablet (50 mg total) by mouth every 2 (two) hours as needed for migraine. May repeat in 2 hours if headache persists or recurs. 10 tablet 0   vitamin B-12 (CYANOCOBALAMIN ) 100 MCG tablet Take 100 mcg by mouth daily.     No current facility-administered medications on file prior to visit.    BP 128/78   Pulse 76   Temp 98.2 F (36.8 C) (Oral)   Resp 15   Ht 5' 3 (1.6 m)   Wt 235 lb (106.6 kg)   LMP 02/08/2013   SpO2 96%   BMI 41.63 kg/m          Objective:   Physical Exam   General Appearance- Not in acute distress.    Chest and Lung Exam Auscultation: Breath sounds:-Normal. Clear even and unlabored. Adventitious sounds:- No Adventitious sounds.  Cardiovascular Auscultation:Rythm - Regular, rate and rythm. Heart Sounds -Normal heart sounds.  Abdomen Inspection:-Inspection Normal.  Palpation/Perucssion: Palpation and Percussion of the abdomen  reveal- Non Tender, No Rebound tenderness, No rigidity(Guarding) and No Palpable abdominal masses.  Liver:-Normal.  Spleen:- Normal.   Back Lumbar no Mid lumbar spine tenderness to palpation. Lt si are tender, No pain on straight leg lift. No pain on lateral movements and flexion/extension of the  spine.  T spine -no mid thoracic tenderness to palpation  Lower ext neurologic  L5-S1 sensation intact bilaterally. Normal patellar reflexes bilaterally. No foot drop bilaterally.   Left anke- wearing brace. No pain on palption thru the brace  Lower ext- calfs symmetic, negative homans signs presently. No edema.       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 Pain localized at ankle-foot junction, talus area. No MRI planned per orthopedist. - Follow up with orthopedist on Wednesday for further evaluation and management.  Thoracic pain with muscle spasms Chronic thoracic pain with muscle spasms, likely musculoskeletal due to altered gait from boot use. Previous MRI normal. - Prescribe Flexeril  5 mg at night for muscle spasms. - Order thoracic spine x-ray. - Administer Toradol  30 mg injection today. - Prescribe Medrol  4 mg, 6-day taper, starting tomorrow.  Chronic low back pain with intermittent radiculopathy Chronic low back pain with intermittent radiculopathy. Previous MRI showed L4-L5 disc bulge without significant stenosis. Recent symptoms include burning and intermittent leg pain. - Continue diclofenac  as needed, up to twice daily. - Monitor for constant low back pain with radiculopathy; consider neurosurgeon referral and possible repeat MRI if symptoms persist.  Left popliteal cyst Left popliteal cyst, 1.3 cm, too small for drainage byr reported hx when went to specialist. No DVT on repeated ultrasounds. Symptoms include discomfort and need for leg elevation. - Monitor for asymmetric swelling, increased pain, or shortness of breath; perform ultrasound to rule out DVT if symptoms present. If any severe symptom ever to occur be seen in the ED.  Follow up 10-14 days or sooner if needed  I personally spent a total  of 40 minutes in the care of the patient today including performing a medically appropriate exam/evaluation, placing orders, and documenting clinical information in the EHR.

## 2024-04-04 NOTE — Telephone Encounter (Signed)
 Pt has appointment today 10/27

## 2024-04-06 ENCOUNTER — Other Ambulatory Visit: Payer: Self-pay | Admitting: Orthopedic Surgery

## 2024-04-06 ENCOUNTER — Encounter: Payer: Self-pay | Admitting: Medical

## 2024-04-06 DIAGNOSIS — M25372 Other instability, left ankle: Secondary | ICD-10-CM | POA: Diagnosis not present

## 2024-04-06 DIAGNOSIS — M25572 Pain in left ankle and joints of left foot: Secondary | ICD-10-CM | POA: Diagnosis not present

## 2024-04-07 ENCOUNTER — Encounter: Payer: Self-pay | Admitting: Orthopedic Surgery

## 2024-04-10 NOTE — Addendum Note (Signed)
 Addended by: DORINA DALLAS HERO on: 04/10/2024 07:17 AM   Modules accepted: Orders

## 2024-04-10 NOTE — Addendum Note (Signed)
 Addended by: DORINA DALLAS HERO on: 04/10/2024 07:19 AM   Modules accepted: Orders

## 2024-04-12 ENCOUNTER — Ambulatory Visit

## 2024-04-12 ENCOUNTER — Other Ambulatory Visit (INDEPENDENT_AMBULATORY_CARE_PROVIDER_SITE_OTHER)

## 2024-04-12 ENCOUNTER — Ambulatory Visit (HOSPITAL_BASED_OUTPATIENT_CLINIC_OR_DEPARTMENT_OTHER)
Admission: RE | Admit: 2024-04-12 | Discharge: 2024-04-12 | Disposition: A | Discharge status: Home / Self Care | Source: Ambulatory Visit | Attending: Medical | Admitting: Medical

## 2024-04-12 ENCOUNTER — Ambulatory Visit
Admission: RE | Admit: 2024-04-12 | Discharge: 2024-04-12 | Disposition: A | Discharge status: Home / Self Care | Source: Ambulatory Visit | Attending: Orthopedic Surgery | Admitting: Orthopedic Surgery

## 2024-04-12 DIAGNOSIS — M2550 Pain in unspecified joint: Secondary | ICD-10-CM

## 2024-04-12 DIAGNOSIS — M5416 Radiculopathy, lumbar region: Secondary | ICD-10-CM | POA: Diagnosis not present

## 2024-04-12 DIAGNOSIS — M25572 Pain in left ankle and joints of left foot: Secondary | ICD-10-CM

## 2024-04-12 LAB — SEDIMENTATION RATE: Sed Rate: 22 mm/h (ref 0–30)

## 2024-04-12 LAB — C-REACTIVE PROTEIN: CRP: 0.9 mg/dL (ref 0.5–20.0)

## 2024-04-14 LAB — RHEUMATOID FACTOR: Rheumatoid fact SerPl-aCnc: <10 [IU]/mL (ref <14)

## 2024-04-14 LAB — ANA: Anti Nuclear Antibody (ANA): NEGATIVE

## 2024-04-15 ENCOUNTER — Ambulatory Visit: Admitting: Medical

## 2024-04-25 DIAGNOSIS — M19072 Primary osteoarthritis, left ankle and foot: Secondary | ICD-10-CM | POA: Insufficient documentation

## 2024-04-25 DIAGNOSIS — F4323 Adjustment disorder with mixed anxiety and depressed mood: Secondary | ICD-10-CM | POA: Diagnosis not present

## 2024-04-25 DIAGNOSIS — M549 Dorsalgia, unspecified: Secondary | ICD-10-CM | POA: Diagnosis not present

## 2024-04-26 DIAGNOSIS — M357 Hypermobility syndrome: Secondary | ICD-10-CM | POA: Diagnosis not present

## 2024-04-26 DIAGNOSIS — M5459 Other low back pain: Secondary | ICD-10-CM | POA: Diagnosis not present

## 2024-04-26 DIAGNOSIS — M25572 Pain in left ankle and joints of left foot: Secondary | ICD-10-CM | POA: Diagnosis not present

## 2024-04-26 DIAGNOSIS — M6281 Muscle weakness (generalized): Secondary | ICD-10-CM | POA: Diagnosis not present

## 2024-05-02 ENCOUNTER — Encounter: Payer: Self-pay | Admitting: Medical

## 2024-05-02 ENCOUNTER — Ambulatory Visit (INDEPENDENT_AMBULATORY_CARE_PROVIDER_SITE_OTHER): Admitting: Medical

## 2024-05-02 VITALS — BP 123/81 | HR 99 | Temp 97.8°F | Ht 63.0 in | Wt 234.4 lb

## 2024-05-02 DIAGNOSIS — G8929 Other chronic pain: Secondary | ICD-10-CM

## 2024-05-02 DIAGNOSIS — M25571 Pain in right ankle and joints of right foot: Secondary | ICD-10-CM | POA: Diagnosis not present

## 2024-05-02 DIAGNOSIS — M25572 Pain in left ankle and joints of left foot: Secondary | ICD-10-CM | POA: Diagnosis not present

## 2024-05-02 MED ORDER — DICLOFENAC SODIUM 75 MG PO TBEC: 75.0000 mg | DELAYED_RELEASE_TABLET | Freq: Two times a day (BID) | ORAL | 1 refills | Status: AC

## 2024-05-02 NOTE — Progress Notes (Signed)
 Subjective:    Patient ID: Lorraine Conrad, female    DOB: 06/02/1971, 53 y.o.   MRN: [id number]  HPI  Lorraine Conrad is a 53 year old female with fibrocalcaneonervicular coalition and plantar calcaneal spur who presents with chronic ankle pain and new right ankle symptoms.  She has chronic left ankle pain related to fibrocalcaneonervicular coalition with a plantar calcaneal spur, causing walking difficulty and a sensation of potential falls, though her walking has improved. Prior MRI and CT were reported to show pressure or arthritis in the ankle. She follows with orthopedics, completed physical therapy, and noted that a Medrol  taper for back pain also relieved her ankle pain.  She has ongoing thoracic and low back pain with spasms in the thoracic region and burning in the lower back. A lumbar spine MRI was reported as normal, though an earlier MRI in 2016 showed minimal degenerative changes at L4-S1. She continues to have back pain despite these findings.  She note back presently low level since using the medrol .  Since her last visit she developed similar symptoms in the right ankle, including instability, stiffness, and tenderness along the sides of the ankle, resembling the left ankle. She has not yet seen podiatry for these right ankle symptoms.  Her physical therapist noted marked stiffness in the left foot and recommended further evaluation. She uses diclofenac  and Tylenol  as needed for pain.  She denies tingling, numbness, or radiation of pain from the lower back to the right foot, but continues to feel instability in the right ankle.        Review of Systems  Constitutional:  Negative for chills, fatigue and fever.  HENT:  Negative for dental problem and ear pain.   Respiratory: Negative.  Negative for chest tightness, shortness of breath and wheezing.   Cardiovascular:  Negative for chest pain and palpitations.  Gastrointestinal:  Negative for abdominal pain, blood in  stool and diarrhea.  Musculoskeletal:  Negative for arthralgias and gait problem.       See hpi  Neurological:  Negative for facial asymmetry and light-headedness.  Hematological:  Negative for adenopathy.  Psychiatric/Behavioral:  Negative for behavioral problems and dysphoric mood. The patient is not nervous/anxious.     Past Medical History:  Diagnosis Date   Allergy    SEASONAL   Anemia    Arthritis    spine (03/01/2014)   Asthma    Chronic lower back pain    Endometriosis    Erosive gastritis    External hemorrhoids    GERD (gastroesophageal reflux disease)    H/O shortness of breath 2014   Cardiopulmonary exercise test results   Headache    probably monthly (03/01/2014)   Migraine 1998; 02/2014   this one's lasted 10 days straight (03/01/2014)   OSA on CPAP    Ovarian cyst, left    Proctalgia fugax    Sleep apnea    on cpap   Thyroid  disease    thyroid  nodule     Social History   Socioeconomic History   Marital status: Married    Spouse name: Not on file   Number of children: 2   Years of education: Not on file   Highest education level: Not on file  Occupational History   Occupation: homemaker  Tobacco Use   Smoking status: Never   Smokeless tobacco: Never   Tobacco comments:    father smoked in house growing up per pt.   Vaping Use   Vaping status:  Never Used  Substance and Sexual Activity   Alcohol use: Yes    Alcohol/week: 0.0 standard drinks of alcohol    Comment: 12-23-16 I'll have 1-2 drinks maybe 3-4 times/yr   Drug use: No   Sexual activity: Yes    Birth control/protection: Surgical  Other Topics Concern   Not on file  Social History Narrative   Daily caffiene use: 6 cups per day   Patient does not get regular excercise   Social Drivers of Health   Financial Resource Strain: Low Risk  (09/23/2022)   Received from [facility]   Overall Financial Resource Strain (CARDIA)    Difficulty of Paying Living Expenses: Not hard at all   Food Insecurity: No Food Insecurity (09/23/2022)   Received from [facility]   Hunger Vital Sign    Within the past 12 months, you worried that your food would run out before you got the money to buy more.: Never true    Within the past 12 months, the food you bought just didn't last and you didn't have money to get more.: Never true  Transportation Needs: No Transportation Needs (09/23/2022)   Received from [facility] - Transportation    Lack of Transportation (Medical): No    Lack of Transportation (Non-Medical): No  Physical Activity: Not on file  Stress: Not on file  Social Connections: Unknown (10/18/2021)   Received from [facility]   Social Network    Social Network: Not on file  Intimate Partner Violence: Unknown (09/09/2021)   Received from [facility]   HITS    Physically Hurt: Not on file    Insult or Talk Down To: Not on file    Threaten Physical Harm: Not on file    Scream or Curse: Not on file    Past Surgical History:  Procedure Laterality Date   ABDOMINAL HYSTERECTOMY  04/2013   BREAST BIOPSY Bilateral 10/05/2000   BREAST BIOPSY Right 08/26/2019   Fibroadenoma   BREAST EXCISIONAL BIOPSY Left 1991   CESAREAN SECTION  2002; 2006   COLONOSCOPY     LAPAROSCOPIC OVARIAN CYSTECTOMY  ~ 1999   NASAL SEPTUM SURGERY  ~ 1991   OOPHORECTOMY Right 2015   TEMPOROMANDIBULAR JOINT SURGERY  ~ 1992   UPPER GASTROINTESTINAL ENDOSCOPY      Family History  Problem Relation Age of Onset   Diverticulitis Mother    Colon polyps Father    Heart disease Father    Colon polyps Paternal Grandmother    Breast cancer Paternal Grandmother    Prostate cancer Paternal Grandfather    Colon polyps Paternal Grandfather    Colon cancer Neg Hx    Esophageal cancer Neg Hx    Rectal cancer Neg Hx    Stomach cancer Neg Hx    Pancreatic cancer Neg Hx     Allergies  Allergen Reactions   Promethazine Hcl Other (See Comments)    Violent tremors    Azithromycin Rash     Head to toe rash   Cephalexin  Diarrhea and Itching   Codeine Other (See Comments)    Was a child when she took this.   Dilaudid [Hydromorphone] Nausea And Vomiting   Soy Allergy (Obsolete) Itching    Current Outpatient Medications on File Prior to Visit  Medication Sig Dispense Refill   acetaminophen  (TYLENOL ) 500 MG tablet Take 500-1,000 mg by mouth every 6 (six) hours as needed for moderate pain (pain score 4-6).     albuterol  (VENTOLIN  HFA)  108 (90 Base) MCG/ACT inhaler Inhale 2 puffs into the lungs every 6 (six) hours as needed for wheezing or shortness of breath. 8 g 2   aspirin  EC 81 MG tablet Take 1 tablet (81 mg total) by mouth daily. Swallow whole.     atorvastatin  (LIPITOR) 10 MG tablet TAKE 1 TABLET BY MOUTH EVERY DAY 30 tablet 8   Azelastine  HCl 137 MCG/SPRAY SOLN PLACE 1 SPRAY INTO BOTH NOSTRILS 2 TIMES DAILY AS DIRECTED (Patient taking differently: Place 1 spray into both nostrils 2 (two) times daily as needed (Rhinitis).) 30 mL 2   budesonide -formoterol  (SYMBICORT ) 160-4.5 MCG/ACT inhaler Inhale 2 puffs into the lungs 2 (two) times daily. (Patient taking differently: Inhale 2 puffs into the lungs 2 (two) times daily as needed (sob).) 1 each 12   busPIRone  (BUSPAR ) 7.5 MG tablet TAKE 1 TABLET BY MOUTH 2 TIMES DAILY. 60 tablet 2   ciclopirox  (PENLAC ) 8 % solution Apply topically at bedtime. Apply over nail and surrounding skin. Apply daily over previous coat. After seven (7) days, may remove with alcohol and continue cycle. (Patient taking differently: Apply 1 Application topically at bedtime as needed (pain). Apply over nail and surrounding skin. Apply daily over previous coat. After seven (7) days, may remove with alcohol and continue cycle.) 6.6 mL 0   clonazePAM  (KLONOPIN ) 0.5 MG tablet Take 1 tablet (0.5 mg total) by mouth 2 (two) times daily as needed for anxiety. 8 tablet 0   cyclobenzaprine  (FLEXERIL ) 5 MG tablet Take 1 tablet (5 mg total) by mouth at bedtime. 3 tablet 0    cyclobenzaprine  (FLEXERIL ) 5 MG tablet 1 tab po q hs prn muscle spasms 5 tablet 0   famotidine  (PEPCID ) 20 MG tablet Take 1 tablet (20 mg total) by mouth 2 (two) times daily as needed for heartburn or indigestion. 60 tablet 3   fluticasone  (FLONASE ) 50 MCG/ACT nasal spray SPRAY 2 SPRAYS INTO EACH NOSTRIL EVERY DAY (Patient taking differently: Place 2 sprays into both nostrils daily as needed for allergies or rhinitis.) 16 mL 5   lansoprazole (PREVACID) 30 MG capsule Take 30 mg by mouth daily as needed (Indigestion).     levocetirizine (XYZAL ) 5 MG tablet Take 5 mg by mouth daily as needed for allergies.     methylPREDNISolone  (MEDROL ) 4 MG tablet Standard 6 day taper dose pack 21 tablet 0   pantoprazole  (PROTONIX ) 40 MG tablet Take 1 tablet (40 mg total) by mouth daily. 90 tablet 3   predniSONE  (STERAPRED UNI-PAK 21 TAB) 10 MG (21) TBPK tablet Taper over 6 days. 21 tablet 0   SUMAtriptan  (IMITREX ) 50 MG tablet Take 1 tablet (50 mg total) by mouth every 2 (two) hours as needed for migraine. May repeat in 2 hours if headache persists or recurs. 10 tablet 0   vitamin B-12 (CYANOCOBALAMIN ) 100 MCG tablet Take 100 mcg by mouth daily.     No current facility-administered medications on file prior to visit.    BP 123/81   Pulse 99   Temp 97.8 F (36.6 C)   Ht 5' 3 (1.6 m)   Wt 234 lb 6.4 oz (106.3 kg)   LMP 02/08/2013   SpO2 98%   BMI 41.52 kg/m        Objective:   Physical Exam   General Mental Status- Alert. General Appearance- Not in acute distress.   Skin General: Color- Normal Color. Moisture- Normal Moisture.  Neck No JVD.  Chest and Lung Exam Auscultation: Breath Sounds:-CTA  Cardiovascular Auscultation:Rythm-  RRR Murmurs & Other Heart Sounds:Auscultation of the heart reveals- No Murmurs.  Abdomen Inspection:-Inspeection Normal. Palpation/Percussion:Note:No mass. Palpation and Percussion of the abdomen reveal- Non Tender, Non Distended + BS, no rebound or  guarding.   Neurologic Cranial Nerve exam:- CN III-XII intact(No nystagmus), symmetric smile. Strength:- 5/5 equal and symmetric strength both upper and lower extremities.   Rt ankle- mild tender to palpation medial and lateral aspect.    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 Medrol  taper relieved pain. Orthopedist suggested possible ankle joint injection if symptoms worsen. - Referred to podiatrist for evaluation of chronic left ankle and foot pain and acute right ankle pain. - Continue physical therapy for upper back range of motion. - Prescribed diclofenac  with one refill, to be combined with Tylenol  if needed.  Acute right ankle and foot pain New right ankle and foot pain with instability. No prior podiatrist evaluation. Orthopedist focused on left ankle. - Referred to podiatrist for evaluation of acute right ankle and foot pain. Second opinion on left ankle.  Thoracic back pain with muscle spasms Persistent thoracic back pain with muscle spasms. Previous MRI normal. Physical therapy recommended. - Continue physical therapy for upper back range of motion.  Chronic low back pain with minimal lumbar degenerative changes Chronic low back pain with minimal degenerative changes. Recent MRI normal. Symptoms not originating from back based on MRI. - Continue physical therapy for upper back range of motion. - Consider weight loss to reduce stress on spine and joints.  Morbid obesity due to excess calories Morbid obesity with previous successful weight loss. Discussed GLP-1 agonists for weight loss, concerns about mom thyroid  history. Benefits of weight loss on spine and joints discussed. - Encouraged weight loss through dietary changes and exercise. - Instructed to  inquire about mother's thyroid  history to assess risk for GLP-1 therapy. - Will consider referral to weight loss management clinic if needed.  -Currently try next 56 days as you lost more than 50 pounds in the past.  follow up date to be determined after reviewing podiatrist visit note.   Santanna Whitford, PA-C

## 2024-05-02 NOTE — Patient Instructions (Signed)
 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 Medrol  taper relieved pain. Orthopedist suggested possible ankle joint injection if symptoms worsen. - Referred to podiatrist for evaluation of chronic left ankle and foot pain and acute right ankle pain. - Continue physical therapy for upper back range of motion. - Prescribed diclofenac  with one refill, to be combined with Tylenol  if needed.  Acute right ankle and foot pain New right ankle and foot pain with instability. No prior podiatrist evaluation. Orthopedist focused on left ankle. - Referred to podiatrist for evaluation of acute right ankle and foot pain. Second opinion on left ankle.  Thoracic back pain with muscle spasms Persistent thoracic back pain with muscle spasms. Previous MRI normal. Physical therapy recommended. - Continue physical therapy for upper back range of motion.  Chronic low back pain with minimal lumbar degenerative changes Chronic low back pain with minimal degenerative changes. Recent MRI normal. Symptoms not originating from back based on MRI. - Continue physical therapy for upper back range of motion. - Consider weight loss to reduce stress on spine and joints.  Morbid obesity due to excess calories Morbid obesity with previous successful weight loss. Discussed GLP-1 agonists for weight loss, concerns about mom thyroid  history. Benefits of weight loss on spine and joints discussed. - Encouraged weight loss through dietary changes and exercise. - Instructed to inquire about mother's thyroid  history to assess risk for GLP-1 therapy. - Will consider referral to weight loss management clinic if needed.  -Currently try next 56 days as you lost more than 50 pounds in the past.  follow up date to be determined after reviewing  podiatrist visit note.

## 2024-05-03 DIAGNOSIS — S39012A Strain of muscle, fascia and tendon of lower back, initial encounter: Secondary | ICD-10-CM | POA: Insufficient documentation

## 2024-05-03 DIAGNOSIS — S39012D Strain of muscle, fascia and tendon of lower back, subsequent encounter: Secondary | ICD-10-CM | POA: Diagnosis not present

## 2024-05-09 ENCOUNTER — Ambulatory Visit: Admitting: Podiatry

## 2024-05-09 DIAGNOSIS — M25572 Pain in left ankle and joints of left foot: Secondary | ICD-10-CM | POA: Diagnosis not present

## 2024-05-09 DIAGNOSIS — M357 Hypermobility syndrome: Secondary | ICD-10-CM | POA: Diagnosis not present

## 2024-05-09 DIAGNOSIS — M5459 Other low back pain: Secondary | ICD-10-CM | POA: Diagnosis not present

## 2024-05-09 DIAGNOSIS — M6281 Muscle weakness (generalized): Secondary | ICD-10-CM | POA: Diagnosis not present

## 2024-05-12 DIAGNOSIS — Z01419 Encounter for gynecological examination (general) (routine) without abnormal findings: Secondary | ICD-10-CM | POA: Diagnosis not present

## 2024-05-13 DIAGNOSIS — S39012D Strain of muscle, fascia and tendon of lower back, subsequent encounter: Secondary | ICD-10-CM | POA: Diagnosis not present

## 2024-05-16 ENCOUNTER — Ambulatory Visit: Admitting: Family Medicine

## 2024-05-16 ENCOUNTER — Encounter: Payer: Self-pay | Admitting: Family Medicine

## 2024-05-16 VITALS — BP 136/82 | HR 89 | Ht 63.0 in | Wt 239.0 lb

## 2024-05-16 DIAGNOSIS — M5459 Other low back pain: Secondary | ICD-10-CM | POA: Diagnosis not present

## 2024-05-16 DIAGNOSIS — Q7962 Hypermobile Ehlers-Danlos syndrome: Secondary | ICD-10-CM | POA: Insufficient documentation

## 2024-05-16 DIAGNOSIS — M357 Hypermobility syndrome: Secondary | ICD-10-CM | POA: Diagnosis not present

## 2024-05-16 DIAGNOSIS — M25572 Pain in left ankle and joints of left foot: Secondary | ICD-10-CM | POA: Diagnosis not present

## 2024-05-16 DIAGNOSIS — R202 Paresthesia of skin: Secondary | ICD-10-CM | POA: Diagnosis not present

## 2024-05-16 DIAGNOSIS — M6281 Muscle weakness (generalized): Secondary | ICD-10-CM | POA: Diagnosis not present

## 2024-05-16 NOTE — Progress Notes (Signed)
 I, Lorraine Conrad, CMA acting as a scribe for Lorraine Lloyd, MD.  Lorraine Conrad is a 53 y.o. female who presents to [facility] today for bilat ankle pain with some instability.  Additionally she notes numbness and weakness or instability radiating down her legs in the past.  This has been treated in the past with steroids which did work.  She recently had a pretty good workup including lumbar spine MRI that was normal and a ankle MRI that did not show significant tear or abnormalities aside from some mild degenerative changes..   Aggravates: Treatments tried: PT @ [facility]    MS: Chronic joint pain, Chronic wide spread muscle pain, Joint Hypermobility, Joint Instability, and Abnormal spinal curvature, cervical and lumbar radiculopathy, bilat hand pain Skin/Immune reactions: Easy Bruising / Bleeding and Medication / Chemical / Food Sensitivities  Neurological: Headache  Migraine, Clumsiness , Burning, Numbness and/or Tingling in Extremities, Cognitive Dysfunction / Brain Fog, Sleep Disturbance, and Sensory Sensitivities ANS: Dysautonomia / POTS / Orthostatic Intolerance, Fatigue, Exercise / Temperature Intolerance , and Anxiety / Panic Respiratory: Dyspnea CV: Tachycardia and Varicose Veins (had eval with cardiology years back, cannot recall dx) GI:  IBS with Diarrhea or Constipation and Frequent N/V, hemorrhoid,  Genitourinary: Pelvic Pain / Fullness / Pressure, Incontinence, Painful Intercourse, Tearing in the Vulvar region, and Endometriosis Hands & Feet: Piezogenic Papules  Treatments tried: PT, Tums, Prevacid, Xyzal   Pertinent review of systems: No fevers or chills  Relevant historical information: Family history for significant hypermobility and POTS and her daughters.   Exam:  BP 136/82   Pulse 89   Ht 5' 3 (1.6 m)   Wt 239 lb (108.4 kg)   LMP 02/08/2013   SpO2 97%   BMI 42.34 kg/m  General: Well Developed, well nourished,  and in no acute distress.   MSK: Hypermobility evaluation.  Beighton score is equivocal with a score of of 3/9 but with a positive modifier of history of hypermobility.  This does equal hypermobility.  Ehlers-Danlos evaluation is also equivocal with a score of 4-5 depending on interpretation of stretch marks and mitral valve prolapse echocardiogram report    Lab and Radiology Results  Narrative & Impression  MR ANKLE WITHOUT IV CONTRAST LEFT   COMPARISON: None.   CLINICAL HISTORY: Left ankle pain. Instability.   PULSE SEQUENCES: Ax T1, Ax T2 FS, Sag T1, Sag T2 FS, Cor STIR, Ax T1 FS   FINDINGS:   Bones: There is no fracture or contusion pattern. Mild degenerative arthrosis without accelerated arthrosis. Mild subchondral edema is present in the tibial plafond. No significant joint effusion is present.   Ligaments: The anterior and posterior tibiofibular and talofibular ligaments are intact. Deltoid ligament and spring ligaments are intact. The sinus tarsi is unremarkable.   Musculotendinous structures: The tibialis anterior, extensor digitorum and extensor hallucis longus tendons are intact. There is slight thickening along the anterior tendons which could be related to remote injury. There is no edema or collection identified. The posterior tibial tendon, flexor hallucis longus and flexor digitorum tendons are unremarkable. Peroneal tendons demonstrate no significant abnormality. No significant tenosynovitis or tendinosis. The Achilles tendon and plantar fascia are unremarkable.   IMPRESSION: No acute injury. No edema or etiology for patient's pain.   Mild degenerative change in the tibiotalar joint.   See above for more detail.   Electronically signed by: Norleen Satchel MD 04/13/2024 08:54 AM EST RP Workstation: [id number]   EXAM: MRI LUMBAR SPINE  WITHOUT CONTRAST   TECHNIQUE: Multiplanar, multisequence MR imaging of the lumbar spine was performed. No  intravenous contrast was administered.   COMPARISON:  February 28, 2015   FINDINGS: Bone marrow: No significant abnormality   Conus and cauda equina: No significant abnormality   Paraspinal tissues: No significant abnormality   L1-L2: Normal   L2-L3: Normal   L3-L4: Normal   L4-L5: Normal   L5-S1: Normal   IMPRESSION: Normal     Electronically Signed   By: Nancyann Burns M.D.   On: 04/12/2024 10:30 I, Lorraine Conrad, personally (independently) visualized and performed the interpretation of the images attached in this note.    Assessment and Plan: 53 y.o. female with hypermobility.  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  Talking to her she does have a pretty strong history in her family of hypermobility and I think it is very likely that her daughters will get criteria which makes it easier for her to meet criteria as well.  Will go ahead and put hypermobile EDS on her problem list but know that there is by the nature of the diagnostic criteria some subjectivity.  She does have a history of somewhat mysterious paresthesias in her lower extremities she has had recent evaluation including lumbar spine MRI that was unremarkable.  Additionally her ankle MRI was not significantly abnormal.  Plan for continued physical therapy.  Information provided regarding Ehlers-Danlos and associated medical comorbidities.  Multiple handouts and book recommendations provided.  Plan to recheck in 2 months.  Okay to schedule her daughters for further evaluation as well.   PDMP not reviewed this encounter. No orders of the defined types were placed in this encounter.  No orders of the defined types were placed in this encounter.    Discussed warning signs or symptoms. Please see discharge instructions. Patient expresses understanding.   The above documentation has been reviewed and is accurate and  complete Lorraine Conrad, M.D.

## 2024-05-16 NOTE — Patient Instructions (Signed)
 Thank you for coming in today.   Continue physical therapy  Check back in 2 months  Check out the book Disjointed Navigating the Diagnosis and Management of Hypermobile Ehlers-Danlos Syndrome and Hypermobility Spectrum Disorders.    For POTS symptoms: Consume 3 L water and 8-12 gram of salt per day   Check out these websites for exercises to try if you have POTS (CHOP Protocol, Mazo Protocol, and [employer] Protocol) [email or web address].taylor-robbins.[email or web address]/ https://dean.info/   Guide to Prof. Russek's Patient Self-Care Handouts https://webspace.Http://www.black-smith.org/  Orthostatic Intolerance and Postural Orthostatic Tachycardia Self-Care Checklist https://webspace.Wvlyxc.com  Steps To Managing Your Mast Cell Activation Syndrome https://webspace.Newyearsms.fi.pdf

## 2024-05-18 ENCOUNTER — Ambulatory Visit: Admitting: Podiatry

## 2024-05-19 DIAGNOSIS — S39012D Strain of muscle, fascia and tendon of lower back, subsequent encounter: Secondary | ICD-10-CM | POA: Diagnosis not present

## 2024-05-23 DIAGNOSIS — M5459 Other low back pain: Secondary | ICD-10-CM | POA: Diagnosis not present

## 2024-05-23 DIAGNOSIS — M25572 Pain in left ankle and joints of left foot: Secondary | ICD-10-CM | POA: Diagnosis not present

## 2024-05-23 DIAGNOSIS — M6281 Muscle weakness (generalized): Secondary | ICD-10-CM | POA: Diagnosis not present

## 2024-05-23 DIAGNOSIS — M357 Hypermobility syndrome: Secondary | ICD-10-CM | POA: Diagnosis not present

## 2024-05-24 DIAGNOSIS — S39012D Strain of muscle, fascia and tendon of lower back, subsequent encounter: Secondary | ICD-10-CM | POA: Diagnosis not present

## 2024-05-30 DIAGNOSIS — M6281 Muscle weakness (generalized): Secondary | ICD-10-CM | POA: Diagnosis not present

## 2024-05-30 DIAGNOSIS — M25572 Pain in left ankle and joints of left foot: Secondary | ICD-10-CM | POA: Diagnosis not present

## 2024-05-30 DIAGNOSIS — M357 Hypermobility syndrome: Secondary | ICD-10-CM | POA: Diagnosis not present

## 2024-05-30 DIAGNOSIS — M5459 Other low back pain: Secondary | ICD-10-CM | POA: Diagnosis not present

## 2024-06-06 DIAGNOSIS — M25572 Pain in left ankle and joints of left foot: Secondary | ICD-10-CM | POA: Diagnosis not present

## 2024-06-06 DIAGNOSIS — M6281 Muscle weakness (generalized): Secondary | ICD-10-CM | POA: Diagnosis not present

## 2024-06-06 DIAGNOSIS — M357 Hypermobility syndrome: Secondary | ICD-10-CM | POA: Diagnosis not present

## 2024-06-06 DIAGNOSIS — M5459 Other low back pain: Secondary | ICD-10-CM | POA: Diagnosis not present

## 2024-06-07 DIAGNOSIS — M79621 Pain in right upper arm: Secondary | ICD-10-CM | POA: Diagnosis not present

## 2024-06-15 ENCOUNTER — Ambulatory Visit: Payer: Self-pay

## 2024-06-15 ENCOUNTER — Other Ambulatory Visit: Payer: Self-pay | Admitting: Medical

## 2024-06-15 MED ORDER — ALBUTEROL SULFATE HFA 108 (90 BASE) MCG/ACT IN AERS: 2.0000 | INHALATION_SPRAY | Freq: Four times a day (QID) | RESPIRATORY_TRACT | 2 refills | Status: DC | PRN

## 2024-06-15 NOTE — Telephone Encounter (Unsigned)
 Copied from CRM #[id number]. Topic: Clinical - Medication Refill >> Jun 15, 2024  1:44 PM Victoria A wrote: Medication: albuterol  (VENTOLIN  HFA) 108 (90 Base) MCG/ACT inhaler  Has the patient contacted their pharmacy? No (Agent: If no, request that the patient contact the pharmacy for the refill. If patient does not wish to contact the pharmacy document the reason why and proceed with request.) (Agent: If yes, when and what did the pharmacy advise?)  This is the patient's preferred pharmacy:  [id number] - [place], [place] - [address] AT [address] OF [facility] [address] [place] [address] Phone: [phone] Fax: [phone]   Is this the correct pharmacy for this prescription? Yes If no, delete pharmacy and type the correct one.   Has the prescription been filled recently? No  Is the patient out of the medication? Yes  Has the patient been seen for an appointment in the last year OR does the patient have an upcoming appointment? Yes  Can we respond through MyChart? Yes  Agent: Please be advised that Rx refills may take up to 3 business days. We ask that you follow-up with your pharmacy.

## 2024-06-15 NOTE — Telephone Encounter (Signed)
Rx refill sent to pt pharmacy 

## 2024-06-15 NOTE — Telephone Encounter (Signed)
ATC x1.  LMTCB. 

## 2024-06-15 NOTE — Telephone Encounter (Signed)
 [email or web address] Pulmonary Triage - Initial Assessment Questions Chief Complaint (e.g., cough, sob, wheezing, fever, chills, sweat or additional symptoms) *Go to specific symptom protocol after initial questions. wheeze  How long have symptoms been present? Few days  Have you tested for COVID or Flu? Note: If not, ask patient if a home test can be taken. If so, instruct patient to call back for positive results. No  MEDICINES:   Have you used any OTC meds to help with symptoms? No If yes, ask What medications?   Have you used your inhalers/maintenance medication? No If yes, What medications?   If inhaler, ask How many puffs and how often? Note: Review instructions on medication in the chart.   OXYGEN: Do you wear supplemental oxygen? No If yes, How many liters are you supposed to use?   Do you monitor your oxygen levels? No If yes, What is your reading (oxygen level) today?   What is your usual oxygen saturation reading?  (Note: Pulmonary O2 sats should be 90% or greater)      Copied from CRM #[id number]. Topic: Clinical - Red Word Triage >> Jun 15, 2024  1:59 PM Mesmerise C wrote: Red Word that prompted transfer to Nurse Triage: Patient has been wheezing a lot and needing her refill for  albuterol  (VENTOLIN  HFA) 108 (90 Base) MCG/ACT inhaler to be rushed, also inquiring if can come in for a breathing treatment Reason for Disposition  [1] MILD asthma attack (e.g., no SOB at rest, mild SOB with walking, speaks normally in sentences, mild wheezing) AND [2] lasting > 24 hours on prescribed treatment  Answer Assessment - Initial Assessment Questions Declined appt tomorrow, is going to UC  1. RESPIRATORY STATUS: Describe your breathing? (e.g., wheezing, shortness of breath, unable to speak, severe coughing)      wheezing 2. ONSET: When did this asthma attack begin?      Few days ago 3. TRIGGER: What do you think triggered this attack? (e.g., URI, exposure to  pollen or other allergen, tobacco smoke)       4. PEAK EXPIRATORY FLOW RATE (PEFR): Do you use a peak flow meter? If Yes, ask: What's the current peak flow? What's your personal best peak flow?       5. SEVERITY: How bad is this attack?      Sob with activity 6. ASTHMA MEDICINES:  What treatments have you tried?      none 7. INHALED QUICK-RELIEF TREATMENTS FOR THIS ATTACK: What treatments have you given yourself so far? and How many and how often? If using an inhaler, ask, How many puffs? Note: Routine treatments are 2 puffs every 4 hours as needed. Rescue treatments are 4 puffs repeated every 20 minutes, up to three times as needed.      none 8. OTHER SYMPTOMS: Do you have any other symptoms? (e.g., chest pain, coughing up yellow sputum, fever, runny nose)     Mild cough, runny nose 9. O2 SATURATION MONITOR:  Do you use an oxygen saturation monitor (pulse oximeter) at home? If Yes, What is your reading (oxygen level) today? What is your usual oxygen saturation reading? (e.g., 95%)     Doesn't monitor  Protocols used: Asthma Attack-A-AH

## 2024-06-17 ENCOUNTER — Emergency Department (HOSPITAL_COMMUNITY): Admission: EM | Admit: 2024-06-17 | Discharge: 2024-06-17 | Disposition: A | Discharge status: Home / Self Care

## 2024-06-17 ENCOUNTER — Emergency Department (HOSPITAL_COMMUNITY)

## 2024-06-17 ENCOUNTER — Other Ambulatory Visit: Payer: Self-pay

## 2024-06-17 ENCOUNTER — Encounter (HOSPITAL_COMMUNITY): Payer: Self-pay

## 2024-06-17 DIAGNOSIS — Z7951 Long term (current) use of inhaled steroids: Secondary | ICD-10-CM | POA: Diagnosis not present

## 2024-06-17 DIAGNOSIS — J101 Influenza due to other identified influenza virus with other respiratory manifestations: Secondary | ICD-10-CM | POA: Diagnosis not present

## 2024-06-17 DIAGNOSIS — R109 Unspecified abdominal pain: Secondary | ICD-10-CM | POA: Insufficient documentation

## 2024-06-17 DIAGNOSIS — J45901 Unspecified asthma with (acute) exacerbation: Secondary | ICD-10-CM | POA: Diagnosis not present

## 2024-06-17 DIAGNOSIS — R059 Cough, unspecified: Secondary | ICD-10-CM | POA: Diagnosis present

## 2024-06-17 DIAGNOSIS — Z7982 Long term (current) use of aspirin: Secondary | ICD-10-CM | POA: Insufficient documentation

## 2024-06-17 DIAGNOSIS — J111 Influenza due to unidentified influenza virus with other respiratory manifestations: Secondary | ICD-10-CM

## 2024-06-17 LAB — RESP PANEL BY RT-PCR (RSV, FLU A&B, COVID)  RVPGX2
Influenza A by PCR: POSITIVE — AB
Influenza B by PCR: NEGATIVE
Resp Syncytial Virus by PCR: NEGATIVE
SARS Coronavirus 2 by RT PCR: NEGATIVE

## 2024-06-17 LAB — CBC WITH DIFFERENTIAL/PLATELET
Abs Immature Granulocytes: 0.04 K/uL (ref 0.00–0.07)
Basophils Absolute: 0 K/uL (ref 0.0–0.1)
Basophils Relative: 0 %
Eosinophils Absolute: 0 K/uL (ref 0.0–0.5)
Eosinophils Relative: 0 %
HCT: 40.2 % (ref 36.0–46.0)
Hemoglobin: 12.8 g/dL (ref 12.0–15.0)
Immature Granulocytes: 0 %
Lymphocytes Relative: 9 %
Lymphs Abs: 0.9 K/uL (ref 0.7–4.0)
MCH: 28.7 pg (ref 26.0–34.0)
MCHC: 31.8 g/dL (ref 30.0–36.0)
MCV: 90.1 fL (ref 80.0–100.0)
Monocytes Absolute: 1.1 K/uL — ABNORMAL HIGH (ref 0.1–1.0)
Monocytes Relative: 10 %
Neutro Abs: 8.2 K/uL — ABNORMAL HIGH (ref 1.7–7.7)
Neutrophils Relative %: 81 %
Platelets: 366 K/uL (ref 150–400)
RBC: 4.46 MIL/uL (ref 3.87–5.11)
RDW: 14.2 % (ref 11.5–15.5)
WBC: 10.3 K/uL (ref 4.0–10.5)
nRBC: 0 % (ref 0.0–0.2)

## 2024-06-17 LAB — BASIC METABOLIC PANEL WITH GFR
Anion gap: 13 (ref 5–15)
BUN: 14 mg/dL (ref 6–20)
CO2: 22 mmol/L (ref 22–32)
Calcium: 8.8 mg/dL — ABNORMAL LOW (ref 8.9–10.3)
Chloride: 103 mmol/L (ref 98–111)
Creatinine, Ser: 1.07 mg/dL — ABNORMAL HIGH (ref 0.44–1.00)
GFR, Estimated: >60 mL/min
Glucose, Bld: 111 mg/dL — ABNORMAL HIGH (ref 70–99)
Potassium: 3.9 mmol/L (ref 3.5–5.1)
Sodium: 139 mmol/L (ref 135–145)

## 2024-06-17 LAB — D-DIMER, QUANTITATIVE: D-Dimer, Quant: 0.62 ug{FEU}/mL — ABNORMAL HIGH (ref 0.00–0.50)

## 2024-06-17 LAB — TROPONIN T, HIGH SENSITIVITY: Troponin T High Sensitivity: <15 ng/L (ref 0–19)

## 2024-06-17 LAB — LIPASE, BLOOD: Lipase: 30 U/L (ref 11–51)

## 2024-06-17 MED ORDER — METHYLPREDNISOLONE SODIUM SUCC 125 MG IJ SOLR
125.0000 mg | INTRAMUSCULAR | Status: AC
  Administered 2024-06-17: 125 mg via INTRAVENOUS
  Filled 2024-06-17: qty 2

## 2024-06-17 MED ORDER — OSELTAMIVIR PHOSPHATE 75 MG PO CAPS: 75.0000 mg | ORAL_CAPSULE | Freq: Two times a day (BID) | ORAL | 0 refills | Status: DC

## 2024-06-17 MED ORDER — SODIUM CHLORIDE 0.9 % IV BOLUS
1000.0000 mL | Freq: Once | INTRAVENOUS | Status: AC
  Administered 2024-06-17: 1000 mL via INTRAVENOUS

## 2024-06-17 MED ORDER — OXYCODONE-ACETAMINOPHEN 5-325 MG PO TABS
1.0000 | ORAL_TABLET | Freq: Once | ORAL | Status: AC
  Administered 2024-06-17: 1 via ORAL
  Filled 2024-06-17: qty 1

## 2024-06-17 MED ORDER — IPRATROPIUM-ALBUTEROL 0.5-2.5 (3) MG/3ML IN SOLN
6.0000 mL | Freq: Once | RESPIRATORY_TRACT | Status: AC
  Administered 2024-06-17: 6 mL via RESPIRATORY_TRACT
  Filled 2024-06-17: qty 3

## 2024-06-17 MED ORDER — PREDNISONE 50 MG PO TABS: ORAL_TABLET | ORAL | 0 refills | Status: DC

## 2024-06-17 MED ORDER — OSELTAMIVIR PHOSPHATE 75 MG PO CAPS
75.0000 mg | ORAL_CAPSULE | Freq: Once | ORAL | Status: AC
  Administered 2024-06-17: 75 mg via ORAL
  Filled 2024-06-17: qty 1

## 2024-06-17 MED ORDER — ONDANSETRON HCL 4 MG/2ML IJ SOLN
4.0000 mg | Freq: Once | INTRAMUSCULAR | Status: AC
  Administered 2024-06-17: 4 mg via INTRAVENOUS
  Filled 2024-06-17: qty 2

## 2024-06-17 MED ORDER — ALBUTEROL SULFATE HFA 108 (90 BASE) MCG/ACT IN AERS: 2.0000 | INHALATION_SPRAY | RESPIRATORY_TRACT | 0 refills | Status: DC | PRN

## 2024-06-17 MED ORDER — LACTATED RINGERS IV BOLUS: 1000.0000 mL | Freq: Once | INTRAVENOUS | Status: DC

## 2024-06-17 MED ORDER — IOHEXOL 350 MG/ML SOLN
75.0000 mL | Freq: Once | INTRAVENOUS | Status: AC | PRN
  Administered 2024-06-17: 75 mL via INTRAVENOUS

## 2024-06-17 NOTE — ED Triage Notes (Signed)
 In addition to initial triage note, pt reports discomfort across her chest. States she is receiving antibiotics for an enlarged lymph node.

## 2024-06-17 NOTE — ED Triage Notes (Signed)
 POV/ brought in by family/ placed in WC, pt c/o SHOB x 3 days, seen at [facility] and treated with steroids/ [facility] progressively getting worse. A&Ox4/ pt tearful

## 2024-06-17 NOTE — Telephone Encounter (Signed)
 PCP office sent in refill of albuterol  on 1/7:   Disp Refills Start End   albuterol  (VENTOLIN  HFA) 108 (90 Base) MCG/ACT inhaler 18 g 2 06/15/2024 --   Sig - Route: Inhale 2 puffs into the lungs every 6 (six) hours as needed for wheezing or shortness of breath. - Inhalation   Sent to pharmacy as: albuterol  (VENTOLIN  HFA) 108 (90 Base) MCG/ACT inhaler   E-Prescribing Status: Receipt confirmed by pharmacy (06/15/2024  7:13 PM EST)    Pharmacy  [id number] - [place],  - [address] AT [address] OF [facility] [address]

## 2024-06-17 NOTE — Discharge Instructions (Signed)
 Your workup today was reassuring.  Please take the prednisone  and use 2 puffs of the albuterol  every 4 hours over the next few days.  Take the Tamiflu  as prescribed.  Follow-up with your doctor.  Return to the ER for worsening symptoms.

## 2024-06-17 NOTE — ED Notes (Signed)
 Pt. Was ambulated down the hall; Pts. O2 sats stayed around 96%-97%; Pt. Denied any dizziness or SOB. EDP notified

## 2024-06-17 NOTE — Telephone Encounter (Signed)
 Patient currently in the ED with SOB and CP.

## 2024-06-17 NOTE — ED Provider Triage Note (Signed)
 Emergency Medicine Provider Triage Evaluation Note  MARGIE URBANOWICZ , a 54 y.o. female  was evaluated in triage.  Pt complains of chest discomfort, pleuritic chest pain, recent travel by a vehicle to [place] .  Seen at urgent care treated with steroids and albuterol  inhaler  Review of Systems  Positive: Shortness of breath, chest/back pain cough Negative: Lower extremity swelling, fever, chills, nausea, vomiting  Physical Exam  BP 137/69 (BP Location: Right Arm)   Pulse 98   Temp 99 F (37.2 C)   Resp (!) 22   LMP 02/08/2013   SpO2 95%  Gen:   Awake, tearful Resp:  Normal effort, no wheezing MSK:   Moves extremities without difficulty  Other:    Medical Decision Making  Medically screening exam initiated at 12:41 PM.  Appropriate orders placed.  [name] was informed that the remainder of the evaluation will be completed by another provider, this initial triage assessment does not replace that evaluation, and the importance of remaining in the ED until their evaluation is complete.  Labs and imaging ordered   Francis Ileana LOISE DEVONNA 06/17/24 1242

## 2024-06-17 NOTE — ED Provider Notes (Signed)
 " [facility] Provider Note   CSN: [id number] Arrival date & time: 06/17/24  1125     Patient presents with: Shortness of Breath and Chest Pain   Lorraine Conrad is a 54 y.o. female.   54 year old female history of asthma without intubations who presents to the emergency department with multiple complaints.  Several days ago started feeling poorly.  Subjective fevers and chills.  Cough, shortness of breath.  Also started developing right-sided chest pain is pleuritic.  Has developed nausea and vomiting and upper abdominal pain as well.  Was seen in urgent care on 1/7 and had a COVID and flu that were negative.  Given Decadron  and albuterol  and discharged home.  Says that the pain is getting worse.  Has developed more nausea and vomiting.  Was in triage and had an episode where she passed out       Prior to Admission medications  Medication Sig Start Date End Date Taking? Authorizing Provider  albuterol  (VENTOLIN  HFA) 108 (90 Base) MCG/ACT inhaler Inhale 2 puffs into the lungs every 4 (four) hours as needed for wheezing or shortness of breath. 06/17/24  Yes Ula Prentice SAUNDERS, MD  oseltamivir  (TAMIFLU ) 75 MG capsule Take 1 capsule (75 mg total) by mouth every 12 (twelve) hours. 06/17/24  Yes Ula Prentice SAUNDERS, MD  predniSONE  (DELTASONE ) 50 MG tablet Take 1 tab by mouth daily 06/17/24  Yes Ula Prentice SAUNDERS, MD  acetaminophen  (TYLENOL ) 500 MG tablet Take 500-1,000 mg by mouth every 6 (six) hours as needed for moderate pain (pain score 4-6).    [provider]  albuterol  (VENTOLIN  HFA) 108 (90 Base) MCG/ACT inhaler Inhale 2 puffs into the lungs every 6 (six) hours as needed for wheezing or shortness of breath. 06/15/24   Saguier, Dallas, PA-C  aspirin  EC 81 MG tablet Take 1 tablet (81 mg total) by mouth daily. Swallow whole. 06/17/23   Krasowski, Elai Vanwyk J, MD  atorvastatin  (LIPITOR) 10 MG tablet TAKE 1 TABLET BY MOUTH EVERY DAY 10/05/23   Saguier, Dallas, PA-C   Azelastine  HCl 137 MCG/SPRAY SOLN PLACE 1 SPRAY INTO BOTH NOSTRILS 2 TIMES DAILY AS DIRECTED Patient taking differently: Place 1 spray into both nostrils 2 (two) times daily as needed (Rhinitis). 02/10/23   Saguier, Dallas, PA-C  budesonide -formoterol  (SYMBICORT ) 160-4.5 MCG/ACT inhaler Inhale 2 puffs into the lungs 2 (two) times daily. Patient taking differently: Inhale 2 puffs into the lungs 2 (two) times daily as needed (sob). 10/04/21   Saguier, Dallas, PA-C  busPIRone  (BUSPAR ) 7.5 MG tablet TAKE 1 TABLET BY MOUTH 2 TIMES DAILY. 10/02/23   Saguier, Dallas, PA-C  ciclopirox  (PENLAC ) 8 % solution Apply topically at bedtime. Apply over nail and surrounding skin. Apply daily over previous coat. After seven (7) days, may remove with alcohol and continue cycle. Patient taking differently: Apply 1 Application topically at bedtime as needed (pain). Apply over nail and surrounding skin. Apply daily over previous coat. After seven (7) days, may remove with alcohol and continue cycle. 08/25/22   Sikora, Rebecca, DPM  clonazePAM  (KLONOPIN ) 0.5 MG tablet Take 1 tablet (0.5 mg total) by mouth 2 (two) times daily as needed for anxiety. 10/07/23   Saguier, Dallas, PA-C  cyclobenzaprine  (FLEXERIL ) 5 MG tablet Take 1 tablet (5 mg total) by mouth at bedtime. 10/07/23   Saguier, Dallas, PA-C  cyclobenzaprine  (FLEXERIL ) 5 MG tablet 1 tab po q hs prn muscle spasms 04/04/24   Saguier, Dallas, PA-C  diclofenac  (VOLTAREN ) 75  MG EC tablet Take 1 tablet (75 mg total) by mouth 2 (two) times daily. 05/02/24   Saguier, Dallas, PA-C  famotidine  (PEPCID ) 20 MG tablet Take 1 tablet (20 mg total) by mouth 2 (two) times daily as needed for heartburn or indigestion. 11/25/23 04/04/24  Federico Rosario BROCKS, MD  fluticasone  (FLONASE ) 50 MCG/ACT nasal spray SPRAY 2 SPRAYS INTO EACH NOSTRIL EVERY DAY Patient taking differently: Place 2 sprays into both nostrils daily as needed for allergies or rhinitis. 03/14/21   Saguier, Dallas, PA-C  lansoprazole  (PREVACID) 30 MG capsule Take 30 mg by mouth daily as needed (Indigestion).    [provider]  levocetirizine (XYZAL ) 5 MG tablet Take 5 mg by mouth daily as needed for allergies.    [provider]  methylPREDNISolone  (MEDROL ) 4 MG tablet Standard 6 day taper dose pack 04/04/24   Saguier, Dallas, PA-C  pantoprazole  (PROTONIX ) 40 MG tablet Take 1 tablet (40 mg total) by mouth daily. 08/27/23   Federico Rosario BROCKS, MD  SUMAtriptan  (IMITREX ) 50 MG tablet Take 1 tablet (50 mg total) by mouth every 2 (two) hours as needed for migraine. May repeat in 2 hours if headache persists or recurs. 10/07/23   Saguier, Dallas, PA-C  vitamin B-12 (CYANOCOBALAMIN ) 100 MCG tablet Take 100 mcg by mouth daily.    [provider]    Allergies: Promethazine hcl, Azithromycin, Cephalexin , Codeine, Dilaudid [hydromorphone], and Soy allergy (obsolete)    Review of Systems  Updated Vital Signs BP 122/74   Pulse 81   Temp 98.7 F (37.1 C) (Oral)   Resp 18   LMP 02/08/2013   SpO2 100%   Physical Exam Vitals and nursing note reviewed.  Constitutional:      General: She is not in acute distress.    Appearance: She is well-developed. She is ill-appearing and diaphoretic.  HENT:     Head: Normocephalic and atraumatic.     Right Ear: External ear normal.     Left Ear: External ear normal.     Nose: Nose normal.  Eyes:     Extraocular Movements: Extraocular movements intact.     Conjunctiva/sclera: Conjunctivae normal.     Pupils: Pupils are equal, round, and reactive to light.  Cardiovascular:     Rate and Rhythm: Normal rate and regular rhythm.     Heart sounds: No murmur heard. Pulmonary:     Effort: Pulmonary effort is normal. No respiratory distress.     Breath sounds: Wheezing present.  Abdominal:     General: Abdomen is flat. There is no distension.     Palpations: Abdomen is soft. There is no mass.     Tenderness: There is abdominal tenderness. There is no guarding.   Musculoskeletal:     Cervical back: Normal range of motion and neck supple.     Right lower leg: No edema.     Left lower leg: No edema.  Skin:    General: Skin is warm.  Neurological:     Mental Status: She is alert and oriented to person, place, and time. Mental status is at baseline.  Psychiatric:        Mood and Affect: Mood normal.     (all labs ordered are listed, but only abnormal results are displayed) Labs Reviewed  RESP PANEL BY RT-PCR (RSV, FLU A&B, COVID)  RVPGX2 - Abnormal; Notable for the following components:      Result Value   Influenza A by PCR POSITIVE (*)    All other  components within normal limits  BASIC METABOLIC PANEL WITH GFR - Abnormal; Notable for the following components:   Glucose, Bld 111 (*)    Creatinine, Ser 1.07 (*)    Calcium  8.8 (*)    All other components within normal limits  D-DIMER, QUANTITATIVE - Abnormal; Notable for the following components:   D-Dimer, Quant 0.62 (*)    All other components within normal limits  CBC WITH DIFFERENTIAL/PLATELET - Abnormal; Notable for the following components:   Neutro Abs 8.2 (*)    Monocytes Absolute 1.1 (*)    All other components within normal limits  LIPASE, BLOOD  TROPONIN T, HIGH SENSITIVITY    EKG: EKG Interpretation Date/Time:  Friday June 17 2024 11:42:20 EST Ventricular Rate:  98 PR Interval:  116 QRS Duration:  84 QT Interval:  332 QTC Calculation: 423 R Axis:   37  Text Interpretation: Normal sinus rhythm Cannot rule out Anterior infarct , age undetermined Abnormal ECG When compared with ECG of 17-Jun-2023 10:33, PREVIOUS ECG IS PRESENT Confirmed by Yolande [place] [phone] on 06/17/2024 1:32:56 PM  Radiology: CT ABDOMEN PELVIS W CONTRAST Result Date: 06/17/2024 EXAM: CT ABDOMEN AND PELVIS WITH CONTRAST 06/17/2024 02:07:22 PM TECHNIQUE: CT of the abdomen and pelvis was performed with the administration of 75 mL of iohexol  (OMNIPAQUE ) 350 MG/ML injection. Multiplanar reformatted  images are provided for review. Automated exposure control, iterative reconstruction, and/or weight-based adjustment of the mA/kV was utilized to reduce the radiation dose to as low as reasonably achievable. COMPARISON: Same-day CTA chest and prior studies. CLINICAL HISTORY: abdominal pain, n/v FINDINGS: LOWER CHEST: Likely bibasilar atelectasis. LIVER: The liver is unremarkable. GALLBLADDER AND BILE DUCTS: Gallbladder is unremarkable. No biliary ductal dilatation. SPLEEN: No acute abnormality. PANCREAS: No acute abnormality. ADRENAL GLANDS: No acute abnormality. KIDNEYS, URETERS AND BLADDER: No stones in the kidneys or ureters. No hydronephrosis. No perinephric or periureteral stranding. Urinary bladder is unremarkable. GI AND BOWEL: Stomach demonstrates no acute abnormality. There is no bowel obstruction. PERITONEUM AND RETROPERITONEUM: No ascites. No free air. VASCULATURE: Aorta is normal in caliber. LYMPH NODES: No lymphadenopathy. REPRODUCTIVE ORGANS: The uterus is surgically absent. No adnexal masses. BONES AND SOFT TISSUES: No acute osseous abnormality. Unchanged tiny fat-containing umbilical hernia. IMPRESSION: 1. No acute pathology in the abdomen or pelvis. Electronically signed by: Michaeline Blanch MD 06/17/2024 03:07 PM EST RP Workstation: [id number]   CT Angio Chest PE W and/or Wo Contrast Result Date: 06/17/2024 EXAM: CTA of the Chest with contrast for PE 06/17/2024 02:07:22 PM TECHNIQUE: CTA of the chest was performed after the administration of 75 mL of iohexol  (OMNIPAQUE ) 350 MG/ML injection. Multiplanar reformatted images are provided for review. MIP images are provided for review. Automated exposure control, iterative reconstruction, and/or weight based adjustment of the mA/kV was utilized to reduce the radiation dose to as low as reasonably achievable. COMPARISON: Same-day chest radiograph from prior studies. CLINICAL HISTORY: Pulmonary embolism (PE) suspected, high prob. Shortness of breath  FINDINGS: PULMONARY ARTERIES: Pulmonary arteries are adequately opacified for evaluation. No pulmonary embolism. Main pulmonary artery is normal in caliber. MEDIASTINUM: The heart and pericardium demonstrate no acute abnormality. There is no acute abnormality of the thoracic aorta. LYMPH NODES: No mediastinal, hilar or axillary lymphadenopathy. LUNGS AND PLEURA: Likely atelectasis in the bilateral lower lobes. No focal consolidation or pulmonary edema. No pleural effusion or pneumothorax. UPPER ABDOMEN: Limited images of the upper abdomen are unremarkable. SOFT TISSUES AND BONES: No acute bone or soft tissue abnormality. IMPRESSION: 1. No pulmonary embolism. 2. Likely  atelectasis in the bilateral lower lobes. Electronically signed by: Michaeline Blanch MD 06/17/2024 03:03 PM EST RP Workstation: [id number]   DG Chest 2 View Result Date: 06/17/2024 CLINICAL DATA:  Shortness of breath x3 days. EXAM: CHEST - 2 VIEW COMPARISON:  May 21, 2023 FINDINGS: The heart size and mediastinal contours are within normal limits. There is mild left infrahilar scarring and/or atelectasis. No pleural effusion or pneumothorax is identified. The visualized skeletal structures are unremarkable. IMPRESSION: Mild left infrahilar scarring and/or atelectasis. Electronically Signed   By: Suzen Dials M.D.   On: 06/17/2024 13:00     Procedures   Medications Ordered in the ED  oxyCODONE -acetaminophen  (PERCOCET/ROXICET) 5-325 MG per tablet 1 tablet (1 tablet Oral Given 06/17/24 1240)  ondansetron  (ZOFRAN ) injection 4 mg (4 mg Intravenous Given 06/17/24 1554)  ipratropium-albuterol  (DUONEB) 0.5-2.5 (3) MG/3ML nebulizer solution 6 mL (6 mLs Nebulization Given 06/17/24 1559)  methylPREDNISolone  sodium succinate (SOLU-MEDROL ) 125 mg/2 mL injection 125 mg (125 mg Intravenous Given 06/17/24 1553)  iohexol  (OMNIPAQUE ) 350 MG/ML injection 75 mL (75 mLs Intravenous Contrast Given 06/17/24 1411)  sodium chloride  0.9 % bolus 1,000 mL (0 mLs  Intravenous Stopped 06/17/24 1728)  oseltamivir  (TAMIFLU ) capsule 75 mg (75 mg Oral Given 06/17/24 1558)    Clinical Course as of 06/17/24 1757  Fri Jun 17, 2024  1529 Signed out to Dr Ula [RP]    Clinical Course User Index [RP] Yolande Lamar BROCKS, MD                                 Medical Decision Making Amount and/or Complexity of Data Reviewed Labs: ordered. Radiology: ordered.  Risk Prescription drug management.   54 year old female history of asthma without intubations who presents to the emergency department with multiple complaints.   Initial Ddx:  URI, asthma exacerbation, vasovagal syncope, dehydration, MI, PE  MDM/Course:  Patient presents to the emergency department with URI type symptoms as well as right-sided chest pain.  In triage start developing nausea and vomiting.  Sounds like she may have had a vasovagal syncopal event.  When she came back to the room she was ill-appearing and diaphoretic.  Did appear quite uncomfortable.  Has wheezing bilaterally.  Has mild abdominal tenderness palpation as well.  In triage had an EKG that was unremarkable and high-sensitivity troponin that was undetectably low.  Her D-dimer was found to be elevated at 0.62 and she was ordered for a CTA of the chest.  With her abdominal pain did add on a CT of the abdomen.  Neither of these showed acute findings.  She was found to be influenza positive which I suspect is causing her symptoms.  Upon re-evaluation patient remained stable.  Signed out to the oncoming physician awaiting reassessment  This patient presents to the ED for concern of complaints listed in HPI, this involves an extensive number of treatment options, and is a complaint that carries with it a high risk of complications and morbidity. Disposition including potential need for admission considered.   Dispo: Pending remainder of workup  I have reviewed the patients home medications and made adjustments as needed Additional  history obtained from spouse Records reviewed Outpatient Clinic Notes The following labs were independently interpreted: Chemistry and show no acute abnormality I independently reviewed the following imaging with scope of interpretation limited to determining acute life threatening conditions related to emergency care: Chest x-ray and agree with the radiologist interpretation with the  following exceptions: none I personally reviewed and interpreted cardiac monitoring: normal sinus rhythm  I personally reviewed and interpreted the pt's EKG: see above for interpretation   Portions of this note were generated with Dragon dictation software. Dictation errors may occur despite best attempts at proofreading.     Final diagnoses:  Exacerbation of asthma, unspecified asthma severity, unspecified whether persistent  Influenza    ED Discharge Orders          Ordered    oseltamivir  (TAMIFLU ) 75 MG capsule  Every 12 hours        06/17/24 1740    predniSONE  (DELTASONE ) 50 MG tablet        06/17/24 1740    albuterol  (VENTOLIN  HFA) 108 (90 Base) MCG/ACT inhaler  Every 4 hours PRN        06/17/24 1740               Yolande Lamar BROCKS, MD 06/17/24 1757  "

## 2024-06-17 NOTE — ED Provider Notes (Signed)
 I received the patient in signout.  This is a 54 year old with history of asthma presenting to the emergency department today with cough and shortness of breath over the past few days.  Had an episode in triage where she seemed to briefly lose consciousness.  Was evaluated for pulmonary embolism at that time and was negative.  Patient's vital signs are stable and plan is to reassess after IV fluids and the remainder of her workup is completed for ultimate disposition. Physical Exam  BP 137/69 (BP Location: Right Arm)   Pulse 98   Temp 99 F (37.2 C)   Resp (!) 22   LMP 02/08/2013   SpO2 95%   Physical Exam  Procedures  Procedures  ED Course / MDM   Clinical Course as of 06/17/24 1556  Fri Jun 17, 2024  1529 Signed out to Dr Ula [RP]    Clinical Course User Index [RP] Yolande Lamar BROCKS, MD   Medical Decision Making Amount and/or Complexity of Data Reviewed Labs: ordered. Radiology: ordered.  Risk Prescription drug management.   The patient is feeling better on reassessment.  Her ambulatory pulse ox was in the mid to high 90s.  She has not had any further symptoms while here.  Discussed this with the patient.  Think she is stable for discharge.  She is discharged with return precautions.       Ula Prentice SAUNDERS, MD 06/17/24 [phone]

## 2024-06-21 ENCOUNTER — Telehealth: Payer: Self-pay

## 2024-06-21 MED ORDER — ONDANSETRON HCL 4 MG PO TABS: 4.0000 mg | ORAL_TABLET | Freq: Three times a day (TID) | ORAL | 0 refills | Status: AC | PRN

## 2024-06-21 NOTE — Addendum Note (Signed)
 Addended by: DORINA DALLAS DORINA PA-C M on: 06/21/2024 02:17 PM   Modules accepted: Orders

## 2024-06-21 NOTE — Telephone Encounter (Signed)
 Copied from CRM [id number]. Topic: Clinical - Medication Question >> Jun 21, 2024 11:04 AM Burnard DEL wrote: Reason for CRM: Patient was seen  in the ED on 1/9/202,and diagnosed with the flu . She was prescribed  tamiflu  and prednisone .Since then she has been experiencing some nausea. She's not sure if its coming from the medication.However she would like to know if she could be prescribed a medication for the nausea?   [id number] [place] - [address] AT [facility] [address] [facility] [address]  Phone: [phone] Fax: [phone]

## 2024-06-21 NOTE — Telephone Encounter (Signed)
 Notified pt via mychart

## 2024-06-28 ENCOUNTER — Encounter: Payer: Self-pay | Admitting: Internal Medicine

## 2024-06-28 ENCOUNTER — Ambulatory Visit: Admitting: Internal Medicine

## 2024-06-28 ENCOUNTER — Ambulatory Visit: Payer: Self-pay | Admitting: Adult Health

## 2024-06-28 ENCOUNTER — Telehealth: Payer: Self-pay | Admitting: Cardiology

## 2024-06-28 VITALS — BP 128/82 | HR 110 | Temp 98.6°F | Ht 64.0 in | Wt 238.6 lb

## 2024-06-28 DIAGNOSIS — R052 Subacute cough: Secondary | ICD-10-CM | POA: Diagnosis not present

## 2024-06-28 DIAGNOSIS — R0609 Other forms of dyspnea: Secondary | ICD-10-CM | POA: Diagnosis not present

## 2024-06-28 DIAGNOSIS — R0789 Other chest pain: Secondary | ICD-10-CM | POA: Diagnosis not present

## 2024-06-28 DIAGNOSIS — G9331 Postviral fatigue syndrome: Secondary | ICD-10-CM

## 2024-06-28 MED ORDER — BENZONATATE 100 MG PO CAPS: 200.0000 mg | ORAL_CAPSULE | Freq: Three times a day (TID) | ORAL | 1 refills | Status: AC | PRN

## 2024-06-28 NOTE — Telephone Encounter (Deleted)
 FYI Only or Action Required?: {FYI only or action required:32865}  Patient is followed in Pulmonology for ***, last seen on 05/15/2023 by Parrett, Madelin RAMAN, NP.  Called Nurse Triage reporting No chief complaint on file..  Symptoms began {$Time; disease onset (duration):32870}.  Interventions attempted: {Pulmonary interventions tried:32866}.  Symptoms are: {COURSE IMPROVING/WORSENING SX HPI:23693}.  Triage Disposition: No disposition on file.  Patient/caregiver understands and will follow disposition?:              Message from [place] C sent at 06/28/2024  1:39 PM EST  Reason for Triage: [place] from primary care provider Dr. Dorina transferred patient pulmonary line. Patient [phone]  went to urgent care yesterday for breathing and wants to be seen sooner 07/28/24 at 10 am with NP, Parrett. Patient is still having breathing issue, struggling to breathing with any movements, hurts to wear a selt belt because of the pressure on chest, and both shoulder blades. Patient denies wheezing, fever, nor dizziness. Please advise

## 2024-06-28 NOTE — Addendum Note (Signed)
 Addended by: GERONIMO AMEL on: 06/28/2024 04:27 PM   Modules accepted: Orders

## 2024-06-28 NOTE — Telephone Encounter (Signed)
 Pt would like a c/b regarding a sooner appointment then what she is scheduled for. Please advise

## 2024-06-28 NOTE — Progress Notes (Addendum)
 "   PCP 06/27/24  his is a 54 year old female with a history of asthma presenting with shortness of breath.  The patient reports experiencing dyspnea since her last visit on 06/15/2024, which was followed by an emergency room visit on 06/17/2024. On each visit, she was treated for an acute asthma exacerbation; she was diagnosed with influenza during her ED visit. She has been managing her respiratory symptoms with inhalers as needed, specifically using Symbicort  and albuterol  every 4 hours as advised during her ER visit. This regimen has provided intermittent relief, with some days being better than others. She reports that even mild physical activity, such as walking downstairs to the kitchen, can trigger her shortness of breath. Her daily activities have been significantly reduced due to fatigue, coughing, and breathing difficulties. She experiences both dry and productive coughs but is not currently on any expectorant like Mucinex. She does not use a nebulizer. She also reports experiencing shortness of breath at rest, which worsens with exertion.   The patient has been experiencing chest tenderness, which is exacerbated by wearing a seatbelt. She also reports pain between her shoulder blades, more pronounced on the left side, but does not have any difficulty moving her shoulder. She occasionally experiences tachycardia, even when at rest. She has appointments scheduled with her pulmonologist and cardiologist.  Additionally, patient endorses a past medical history of GERD. She has not been using any medical therapy to treat the diagnosis or symptoms.  She has a history of reflux but is not currently on any medication for it.  Patient denies any recent travel, surgery or exogenous hormone use. No history of DVT/PE. Denies unilateral leg pain or swelling, fever and no other reported complaints. No report of chest pain, hemoptysis, night sweats, abdominal pain, nausea, vomiting, diarrhea or sore throat.  No nocturnal dyspnea, pedal edema, weight gain or orthopnea. v  Differential Diagnosis: - Asthma exacerbation: History of asthma. Recent ER visit. DuoNeb treatment administered, good response, improved breath sounds. Nebulizer dispensed for home use. Prescribed DuoNeb, prednisone , montelukast . Continue albuterol  inhaler. Prednisone  burst 40 mg for 5 days. Montelukast  nightly dosing. - Gastroesophageal reflux disease (GERD): History of GERD. Likely contributing to cough. Initiated omeprazole  40 mg daily for 2 weeks. Assess symptom improvement. Continue as needed or resume if symptoms persist. - Costochondritis: Suspected due to palpable chest and back tenderness and pain with coughing. - Community acquired pneumonia: Unlikely due normal chest x-ray results. CBC pending. -Pulmonary embolism: No report of hemoptysis. No unilateral calf edema, no malignancy, prolonged sedation, recent surgery, hormone use, tachycardia, prior PE or DVT. Low PERC (1) score. Considered. D-dimer pending. - Acute bronchitis: Reassuring vital signs, afebrile, no tachycardia. Some sputum production with cough. Increased dyspnea. Minimal rhonchi present on exam. Possible, but less likely. - Congestive heart failure: No orthopnea, nocturnal dyspnea or weight gain. No JVD or peripheral edema on exam. Less likely.  UC Course: - Chest x-ray: No acute cardiac or pulmonary abnormalities. - DuoNeb treatment administered: Improved airflow. Decreased shortness of breath. - EKG completed: Normal sinus rhythm. No arrhythmia. No acute ST segment T wave abnormalities suggesting myocardial ischemia. - Dispensed nebulizer and prescribed DuoNeb 3 times daily. - Prescribed montelukast . - Prescribed prednisone  burst, 40 mg daily for 5 days. - Prescribed omeprazole  40 mg daily for GERD. - Counseled to move up appointments with pulmonology and cardiology.  Final Assessment: Asthma exacerbation with shortness of breath and cough. EKG with no  acute abnormalities. DuoNeb treatment improved airflow, good patient response. Essential to  attain asthma control. Nebulizer dispensed for home use with prescribed DuoNeb. Montelukast  initiated. Counseled to avoid asthma triggers. GERD possibly contributing to cough. Omeprazole  initiated.  Clinical Impression: - Asthma exacerbation - Gastroesophageal reflux disease (GERD) - Shortness of breath  Follow-Up: Pulmonology and cardiology appointments. Try to mo   OV 06/28/2024  Subjective:  Patient ID: Lorraine Conrad, female , DOB: [birth year] , age 68 y.o. , MRN: [id number] , ADDRESS: [address] [place] [id number] PCP Saguier, [place], PA-C Patient Care Team: Dorina, [place] RIGGERS as PCP - General (Internal Medicine)  This Provider for this visit: Treatment Team:  Attending Provider: Geronimo Amel, MD    06/28/2024 -   Chief Complaint  Patient presents with   Acute Visit    Pt states she is having trouble breathing since January 7th, 2026, pt stated when she went to ER she passed out and stopped breathing, stated she had Flu, has had breathing treatments SOB occurs w/ any activity Prod cough ( phlegm clear) and also Dry cough     HPI Lorraine Conrad 53 y.o. -Lorraine Conrad is a 54 year old female with asthma who presents with shortness of breath following a recent flu infection.  She has been experiencing significant shortness of breath since January 7th, which worsened after being diagnosed with the flu. Initially, she sought care at urgent care and received breathing treatments. On January 9th, she visited the ER where she passed out and stopped breathing a couple of times. She was given a breathing treatment and diagnosed with the flu. Since then, she has continued to experience shortness of breath, particularly with minimal exertion such as walking short distances or performing simple tasks like making eggs.  Her cough, initially severe and painful, especially between  her shoulder blades, has improved but is still present. The shortness of breath has not improved significantly despite treatments. She has been using a nebulizer and was prescribed prednisone , which she started yesterday at a dose of 40 mg. The first nebulizer treatment seemed to help, but subsequent treatments have not provided the same relief.  Her past medical history includes asthma, which has caused difficulties in the past but was not a significant issue prior to the flu. She has had a clear chest x-ray, although there was some confusion about findings related to the left lung. A CT scan was performed on January 9th to rule out a blood clot.  In my personal visualization she does have right upper lobe posterior infiltrate and bilateral lower lobe infiltrates that could be viral pneumonia or atelectasis.  No current fever, oxygen use at home, or history of fibrosis, COPD, cancer, diabetes, or high blood pressure. She has high cholesterol and had an echocardiogram about a year ago, which was normal.  She also reports a history of arthritis, for which she takes diclofenac  occasionally. She is unsure if it is rheumatoid or osteoarthritis and does not recall seeing a rheumatologist recently. She mentions a recent episode of extreme pain in her right breast and armpit around December 27th, for which she was started on antibiotics by her OB.  Blood work in 2025 November shows negative autoimmune profile  EKG hospital - non speicifc. - Trop normal  [facility]er at [facility] care normal  Simple office walk 224 (66+46 x 2) feet Pod A at [facility] x  3 laps goal with forehead probe 06/28/2024    O2 used ra   Number laps completed Jsut 1 and started getting dizzy  Comments about pace slow   Resting Pulse Ox/HR 97% and 112/min   Final Pulse Ox/HR 98% and 125/min   Desaturated </= 88% no   Desaturated <= 3% points no   Got Tachycardic >/= 90/min yes   Symptoms at end of test dizzy   Miscellaneous  comments -     CMA reported that during the simple walk test she was getting dizzy.  Therefore we did do orthostatic vital signs  Orthostatic vital signs are NORMAL - Supine blood pressure 118 systolic/82 diastolic, heart rate 108, pulse ox 94% - Standing after 1 minute systolic 124 and diastolic 84, heart rate 115/min and pulse ox stated 94%  Twelve-lead EKG in the office 0 sinus tachycardia otherwise normal EKG with a heart rate of 106.  QTc was also normal.  PFT     Latest Ref Rng & Units 12/31/2015    1:01 PM  PFT Results  FVC-Pre L 2.98   FVC-Predicted Pre % 81   FVC-Post L 2.84   FVC-Predicted Post % 77   Pre FEV1/FVC % % 88   Post FEV1/FCV % % 89   FEV1-Pre L 2.62   FEV1-Predicted Pre % 88   FEV1-Post L 2.52       Latest Reference Range & Units 11/07/07 02:15 06/19/08 14:05 02/18/11 12:05 04/05/12 23:54 10/09/12 18:25 02/16/13 12:34 03/18/13 08:49 03/28/13 08:50 10/11/13 11:13 01/16/14 09:46 02/23/14 13:59 02/24/14 19:15 02/27/14 18:15 03/01/14 01:30 03/09/14 12:22 03/20/14 08:02 03/27/14 12:46 10/30/14 12:20 02/28/15 20:30 05/16/15 12:27 08/23/15 13:00 09/04/15 13:03 02/13/16 16:58 03/12/16 09:41 07/10/16 11:01 09/08/16 11:38 04/22/18 10:18 05/18/18 11:04 06/15/20 09:35 03/19/22 10:15 06/17/24 16:00  Eosinophils Absolute 0.0 - 0.5 K/uL 0.2 0.2 0.2 0.2 0.2 0.1 0.1 0.2 0.1 0.2 0.1 0.3 0.2 0.0 0.3 0.4 0.4 0.3 0.2 0.2 0.2 0.2 0.2 0.2 0.2 0.3 0.2 0.2 0.1 0.2 0.0    LAB RESULTS last 96 hours No results found.    Allergies[1]    has a past medical history of Allergy, Anemia, Arthritis, Asthma, Chronic lower back pain, Endometriosis, Erosive gastritis, External hemorrhoids, GERD (gastroesophageal reflux disease), H/O shortness of breath (2014), Headache, Migraine (1998; 02/2014), OSA on CPAP, Ovarian cyst, left, Proctalgia fugax, Sleep apnea, and Thyroid  disease.   reports that she has never smoked. She has never used smokeless tobacco.  Past Surgical History:  Procedure  Laterality Date   ABDOMINAL HYSTERECTOMY  04/2013   BREAST BIOPSY Bilateral 10/05/2000   BREAST BIOPSY Right 08/26/2019   Fibroadenoma   BREAST EXCISIONAL BIOPSY Left 1991   CESAREAN SECTION  2002; 2006   COLONOSCOPY     LAPAROSCOPIC OVARIAN CYSTECTOMY  ~ 1999   NASAL SEPTUM SURGERY  ~ 1991   OOPHORECTOMY Right 2015   TEMPOROMANDIBULAR JOINT SURGERY  ~ 1992   UPPER GASTROINTESTINAL ENDOSCOPY      Allergies[2]  Immunization History  Administered Date(s) Administered   Influenza-Unspecified 08/04/2015   PFIZER(Purple Top)SARS-COV-2 Vaccination 08/14/2019, 09/14/2019, 06/04/2020   Td 12/11/2014   Tdap 01/16/2014    Family History  Problem Relation Age of Onset   Diverticulitis Mother    Colon polyps Father    Heart disease Father    Colon polyps Paternal Grandmother    Breast cancer Paternal Grandmother    Prostate cancer Paternal Grandfather    Colon polyps Paternal Grandfather    Colon cancer Neg Hx    Esophageal cancer Neg Hx    Rectal cancer Neg Hx    Stomach cancer Neg Hx    Pancreatic  cancer Neg Hx     Current Medications[3]      Objective:   Vitals:   06/28/24 1504  BP: 128/82  Pulse: (!) 110  Temp: 98.6 F (37 C)  TempSrc: Oral  SpO2: 96%  Weight: 238 lb 9.6 oz (108.2 kg)  Height: 5' 4 (1.626 m)    Estimated body mass index is 40.96 kg/m as calculated from the following:   Height as of this encounter: 5' 4 (1.626 m).   Weight as of this encounter: 238 lb 9.6 oz (108.2 kg).  @WEIGHTCHANGE @  [employer]   06/28/24 1504  Weight: 238 lb 9.6 oz (108.2 kg)     Physical Exam   General: No distress. Looks worried O2 at rest: no Cane present: no Sitting in wheel chair: no Frail: no Obese: no Neuro: Alert and Oriented x 3. GCS 15. Speech normal Psych: Pleasant Resp:  Barrel Chest - no.  Wheeze - non, Crackles - no, No overt respiratory distress CVS: Normal heart sounds. Murmurs - no Ext: Stigmata of Connective Tissue Disease -  no HEENT: Normal upper airway. PEERL +. No post nasal drip DOES COUGH       Assessment/     Assessment & Plan Post-influenza syndrome  DOE (dyspnea on exertion)  Subacute cough  Atypical chest pain    PLAN Patient Instructions     ICD-10-CM   1. Post-influenza syndrome  G93.31 B Nat Peptide    Troponin T, High Sensitivity (hs-TnT)    ECHOCARDIOGRAM COMPLETE    2. DOE (dyspnea on exertion)  R06.09 B Nat Peptide    Troponin T, High Sensitivity (hs-TnT)    ECHOCARDIOGRAM COMPLETE    3. Subacute cough  R05.2 B Nat Peptide    Troponin T, High Sensitivity (hs-TnT)    ECHOCARDIOGRAM COMPLETE    4. Atypical chest pain  R07.89 B Nat Peptide    Troponin T, High Sensitivity (hs-TnT)    ECHOCARDIOGRAM COMPLETE       Much of his symptoms are very consistent with post flow syndrome but there is significant symptom burden much greater than objective findings.  This is because the flu affects the body in a very cellular level.  Generally time and patients and trying to push yourself with good hydration and balancing rest and activity is the best approach  There is some fluid associated pulmonary inflammation in the lower lobes that could be part of the flu consistent with viral pneumonia but this was early January 2026.  Currently blood clot ruled out  You are on the right treatment protocol for any fluid related airway problems  Plan - Check blood work for BNP and troponin to make sure the heart is okay - Check echocardiogram - Continue and finish a prednisone  taper as outlined - Take sample Breztri and take it 2 puff 2 times daily for 4 weeks (take 2 samples] - You need a repeat CT chest without contrast in early April 2026  Follow-up - 2 to 3 weeks with nurse practitioner to report progress  - Follow-up CT can be ordered at that time - Go to ER if WORSE       FOLLOWUP    Return for - 2 to 3 weeks with nurse practitioner to report progress.  ( Level 05 visit  E&M 2024: Estb >= 40 min   in  visit type: on-site physical face to visit  in total care time and counseling or/and coordination of care by this undersigned MD - Dr Dorethia  Harris Penton. This includes one or more of the following on this same day 06/28/2024: pre-charting, chart review, note writing, documentation discussion of test results, diagnostic or treatment recommendations, prognosis, risks and benefits of management options, instructions, education, compliance or risk-factor reduction. It excludes time spent by the CMA or office staff in the care of the patient. Actual time 45  min)   SIGNATURE    Dr. Dorethia Cave, M.D., F.C.C.P,  Pulmonary and Critical Care Medicine Staff Physician, [facility] Center Director - Interstitial Lung Disease  Program  Pulmonary Fibrosis [facility] at [facility] [place], [place], [address]  Pager: [phone], If no answer or between  15:00h - 7:00h: call [phone] Telephone: [phone]  3:37 PM 06/28/2024     [1]  Allergies Allergen Reactions   Promethazine Hcl Other (See Comments)    Violent tremors    Azithromycin Rash    Head to toe rash   Cephalexin  Diarrhea and Itching   Codeine Other (See Comments)    Was a child when she took this.   Dilaudid [Hydromorphone] Nausea And Vomiting   Soy Allergy (Obsolete) Itching  [2]  Allergies Allergen Reactions   Promethazine Hcl Other (See Comments)    Violent tremors    Azithromycin Rash    Head to toe rash   Cephalexin  Diarrhea and Itching   Codeine Other (See Comments)    Was a child when she took this.   Dilaudid [Hydromorphone] Nausea And Vomiting   Soy Allergy (Obsolete) Itching  [3]  Current Outpatient Medications:    acetaminophen  (TYLENOL ) 500 MG tablet, Take 500-1,000 mg by mouth every 6 (six) hours as needed for moderate pain (pain score 4-6)., Disp: , Rfl:    albuterol  (VENTOLIN  HFA) 108 (90 Base) MCG/ACT inhaler, Inhale 2 puffs into the  lungs every 6 (six) hours as needed for wheezing or shortness of breath., Disp: 18 g, Rfl: 2   albuterol  (VENTOLIN  HFA) 108 (90 Base) MCG/ACT inhaler, Inhale 2 puffs into the lungs every 4 (four) hours as needed for wheezing or shortness of breath., Disp: 1 each, Rfl: 0   cyclobenzaprine  (FLEXERIL ) 5 MG tablet, Take 1 tablet (5 mg total) by mouth at bedtime., Disp: 3 tablet, Rfl: 0   cyclobenzaprine  (FLEXERIL ) 5 MG tablet, 1 tab po q hs prn muscle spasms, Disp: 5 tablet, Rfl: 0   fluticasone  (FLONASE ) 50 MCG/ACT nasal spray, SPRAY 2 SPRAYS INTO EACH NOSTRIL EVERY DAY (Patient taking differently: Place 2 sprays into both nostrils daily as needed for allergies or rhinitis.), Disp: 16 mL, Rfl: 5   ipratropium-albuterol  (DUONEB) 0.5-2.5 (3) MG/3ML SOLN, Inhale 3 mLs into the lungs., Disp: , Rfl:    montelukast  (SINGULAIR ) 10 MG tablet, Take 10 mg by mouth at bedtime., Disp: , Rfl:    omeprazole  (PRILOSEC) 40 MG capsule, Take 40 mg by mouth., Disp: , Rfl:    ondansetron  (ZOFRAN ) 4 MG tablet, Take 1 tablet (4 mg total) by mouth every 8 (eight) hours as needed for nausea or vomiting., Disp: 20 tablet, Rfl: 0   predniSONE  (DELTASONE ) 50 MG tablet, Take 1 tab by mouth daily, Disp: 4 tablet, Rfl: 0   sulfamethoxazole -trimethoprim  (BACTRIM  DS) 800-160 MG tablet, Take 1 tablet by mouth., Disp: , Rfl:    aspirin  EC 81 MG tablet, Take 1 tablet (81 mg total) by mouth daily. Swallow whole. (Patient not taking: Reported on 06/28/2024), Disp: , Rfl:    atorvastatin  (LIPITOR) 10 MG tablet,  TAKE 1 TABLET BY MOUTH EVERY DAY (Patient not taking: Reported on 06/28/2024), Disp: 30 tablet, Rfl: 8   Azelastine  HCl 137 MCG/SPRAY SOLN, PLACE 1 SPRAY INTO BOTH NOSTRILS 2 TIMES DAILY AS DIRECTED (Patient not taking: Reported on 06/28/2024), Disp: 30 mL, Rfl: 2   budesonide -formoterol  (SYMBICORT ) 160-4.5 MCG/ACT inhaler, Inhale 2 puffs into the lungs 2 (two) times daily. (Patient not taking: Reported on 06/28/2024), Disp: 1 each, Rfl:  12   busPIRone  (BUSPAR ) 7.5 MG tablet, TAKE 1 TABLET BY MOUTH 2 TIMES DAILY. (Patient not taking: Reported on 06/28/2024), Disp: 60 tablet, Rfl: 2   ciclopirox  (PENLAC ) 8 % solution, Apply topically at bedtime. Apply over nail and surrounding skin. Apply daily over previous coat. After seven (7) days, may remove with alcohol and continue cycle. (Patient not taking: Reported on 06/28/2024), Disp: 6.6 mL, Rfl: 0   clonazePAM  (KLONOPIN ) 0.5 MG tablet, Take 1 tablet (0.5 mg total) by mouth 2 (two) times daily as needed for anxiety. (Patient not taking: Reported on 06/28/2024), Disp: 8 tablet, Rfl: 0   diclofenac  (VOLTAREN ) 75 MG EC tablet, Take 1 tablet (75 mg total) by mouth 2 (two) times daily. (Patient not taking: Reported on 06/28/2024), Disp: 20 tablet, Rfl: 1   famotidine  (PEPCID ) 20 MG tablet, Take 1 tablet (20 mg total) by mouth 2 (two) times daily as needed for heartburn or indigestion. (Patient not taking: Reported on 06/28/2024), Disp: 60 tablet, Rfl: 3   lansoprazole (PREVACID) 30 MG capsule, Take 30 mg by mouth daily as needed (Indigestion). (Patient not taking: Reported on 06/28/2024), Disp: , Rfl:    levocetirizine (XYZAL ) 5 MG tablet, Take 5 mg by mouth daily as needed for allergies. (Patient not taking: Reported on 06/28/2024), Disp: , Rfl:    methylPREDNISolone  (MEDROL ) 4 MG tablet, Standard 6 day taper dose pack (Patient not taking: Reported on 06/28/2024), Disp: 21 tablet, Rfl: 0   oseltamivir  (TAMIFLU ) 75 MG capsule, Take 1 capsule (75 mg total) by mouth every 12 (twelve) hours. (Patient not taking: Reported on 06/28/2024), Disp: 10 capsule, Rfl: 0   pantoprazole  (PROTONIX ) 40 MG tablet, Take 1 tablet (40 mg total) by mouth daily. (Patient not taking: Reported on 06/28/2024), Disp: 90 tablet, Rfl: 3   SUMAtriptan  (IMITREX ) 50 MG tablet, Take 1 tablet (50 mg total) by mouth every 2 (two) hours as needed for migraine. May repeat in 2 hours if headache persists or recurs. (Patient not taking: Reported  on 06/28/2024), Disp: 10 tablet, Rfl: 0   vitamin B-12 (CYANOCOBALAMIN ) 100 MCG tablet, Take 100 mcg by mouth daily. (Patient not taking: Reported on 06/28/2024), Disp: , Rfl:   "

## 2024-06-28 NOTE — Telephone Encounter (Signed)
 Pt saw DR Geronimo today. NFN

## 2024-06-28 NOTE — Telephone Encounter (Signed)
 FYI Only or Action Required?: FYI only for provider: appointment scheduled on 1/20.   Patient is followed in Pulmonology for Asthma, last seen on 05/15/2023 by Parrett, Madelin RAMAN, NP.   Called Nurse Triage reporting No chief complaint on file..   Symptoms began x 2 weeks.   Interventions attempted: Prescription medications: Prednisone , Nebulizer.   Symptoms are: unchanged.   Triage Disposition: No disposition on file.   Patient/caregiver understands and will follow disposition?:              Message from [place] sent at 06/28/2024  1:39 PM EST   Reason for Triage: [place] from primary care provider Dr. Dorina transferred patient pulmonary line. Patient [id number]  went to urgent care yesterday for breathing and wants to be seen sooner 07/28/24 at 10 am with NP, Parrett. Patient is still having breathing issue, struggling to breathing with any movements, hurts to wear a selt belt because of the pressure on chest, and both shoulder blades. Patient denies wheezing, fever, nor dizziness. Please advise Reason for Disposition  [1] MILD difficulty breathing (e.g., minimal/no SOB at rest, SOB with walking, pulse < 100) AND [2] NEW-onset or WORSE than normal  Answer Assessment - Initial Assessment Questions 1. RESPIRATORY STATUS: Describe your breathing? (e.g., wheezing, shortness of breath, unable to speak, severe coughing)      SOB on exertion   2. ONSET: When did this breathing problem begin?      Since 06/15/24   3. PATTERN Does the difficult breathing come and go, or has it been constant since it started?      Constant   4. SEVERITY: How bad is your breathing? (e.g., mild, moderate, severe)              Mild       5. RECURRENT SYMPTOM: Have you had difficulty breathing before? If Yes, ask: When was the last time? and What happened that time?      No   6. CARDIAC HISTORY: Do you have any history of heart disease? (e.g., heart attack, angina, bypass surgery,  angioplasty)      No   7. LUNG HISTORY: Do you have any history of lung disease?  (e.g., pulmonary embolus, asthma, emphysema)     Asthma   8. CAUSE: What do you think is causing the breathing problem?      Unsure, seen in UC yesterday and gave breathing treatment, suspects asthma.  9. OTHER SYMPTOMS: Do you have any other symptoms? (e.g., chest pain, cough, dizziness, fever, runny nose)     No      Patient called in to triage with complaints of SOB on exertion. This has been ongoing for 2 weeks.  The patient stated she was seen in UC yesterday for same symptoms. She states she had the Flu a week ago.  For home care, the patient is taking  Prednisone , Nebulizer.  Appointment scheduled for further evaluation; Patient agrees with the plan of care, and will reach out if symptoms worsen or persist.  Protocols used: Breathing Difficulty-A-AH

## 2024-06-28 NOTE — Addendum Note (Signed)
 Addended by: GERONIMO AMEL on: 06/28/2024 04:09 PM   Modules accepted: Orders

## 2024-06-28 NOTE — Patient Instructions (Addendum)
"    ICD-10-CM   1. Post-influenza syndrome  G93.31 B Nat Peptide    Troponin T, High Sensitivity (hs-TnT)    ECHOCARDIOGRAM COMPLETE    2. DOE (dyspnea on exertion)  R06.09 B Nat Peptide    Troponin T, High Sensitivity (hs-TnT)    ECHOCARDIOGRAM COMPLETE    3. Subacute cough  R05.2 B Nat Peptide    Troponin T, High Sensitivity (hs-TnT)    ECHOCARDIOGRAM COMPLETE    4. Atypical chest pain  R07.89 B Nat Peptide    Troponin T, High Sensitivity (hs-TnT)    ECHOCARDIOGRAM COMPLETE       Much of his symptoms are very consistent with post flow syndrome but there is significant symptom burden much greater than objective findings.  This is because the flu affects the body in a very cellular level.  Generally time and patients and trying to push yourself with good hydration and balancing rest and activity is the best approach  There is some flu associated pulmonary inflammation in the lower lobes that could be part of the flu consistent with viral pneumonia but this was early January 2026.  Currently blood clot ruled out  In office EKG normal In office orthostatic vital signs normal  You are on the right treatment protocol for any flu  related airway problems    Plan - Check blood work for BNP and troponin to make sure the heart is okay - Check echocardiogram - Continue and finish a prednisone  taper as outlined - Take sample Breztri and take it 2 puff 2 times daily for 4 weeks (take 2 samples] - You need a repeat CT chest without contrast in early April 2026 - start tessalon  cough pereles as needed  Follow-up - 2 to 3 weeks with nurse practitioner to report progress  - Follow-up CT can be ordered at that time - Go to ER if WORSE    "

## 2024-06-29 ENCOUNTER — Encounter: Payer: Self-pay | Admitting: Medical

## 2024-06-29 ENCOUNTER — Ambulatory Visit: Admitting: Medical

## 2024-06-29 ENCOUNTER — Ambulatory Visit: Payer: Self-pay | Admitting: Internal Medicine

## 2024-06-29 VITALS — BP 110/70 | HR 99 | Temp 98.5°F | Resp 16 | Ht 64.0 in | Wt 238.0 lb

## 2024-06-29 DIAGNOSIS — J4540 Moderate persistent asthma, uncomplicated: Secondary | ICD-10-CM

## 2024-06-29 DIAGNOSIS — R059 Cough, unspecified: Secondary | ICD-10-CM

## 2024-06-29 DIAGNOSIS — R591 Generalized enlarged lymph nodes: Secondary | ICD-10-CM

## 2024-06-29 LAB — TROPONIN I: Troponin I: 11 ng/L

## 2024-06-29 LAB — EXTRA SPECIMEN

## 2024-06-29 LAB — BRAIN NATRIURETIC PEPTIDE: Pro B Natriuretic peptide (BNP): 16 pg/mL (ref 1.0–100.0)

## 2024-06-29 NOTE — Patient Instructions (Addendum)
 Asthma exacerbation(moderate post flu) Post-influenza A infection with shortness of breath and cough.Pulmonologist recommended echocardiogram to assess heart function post-flu. Test will be done tomorrow. - Continue prednisone  taper as prescribed. - Use Breztri two plus inhaler twice daily for four weeks. - Use albuterol  nebulizer three times daily as needed. -continue prednisone  20 mg bid but update me how many tabs you have left as may give you 5-7 days more but lower dose as we approach weekend and incoming snow. - Send MyChart message by Friday to confirm prednisone  supply and adjust prescription if necessary.  Influenza A infection Confirmed on January 9th. Symptoms included shortness of breath, cough, and vasovagal syncope. Treated with Tamiflu  and prednisone . - Continue Tamiflu  as prescribed. - Monitor symptoms and follow up with pulmonologist in 2-3 weeks.  Lymphadenopathy Right axillary region, initially treated with amoxicillin . Symptoms persist. - Follow up with gynecologist for re-evaluation of axillary lymphadenopathy.  Urinary tract infection Recent infection treated with antibiotics.  Follow up 2-3 weeks with pulmonologist or sooner  if needed. If worse/severe symptoms then be seen in the ED

## 2024-06-29 NOTE — Progress Notes (Signed)
 "  Subjective:    Patient ID: Lorraine Conrad, female    DOB: 11/05/1970, 54 y.o.   MRN: [id number]  HPI   Pt in for follow up on ED 06-17-2024    ED Provider Notes Ula Prentice SAUNDERS, MD (Physician)  Emergency Medicine I received the patient in signout.  This is a 54 year old with history of asthma presenting to the emergency department today with cough and shortness of breath over the past few days.  Had an episode in triage where she seemed to briefly lose consciousness.  Was evaluated for pulmonary embolism at that time and was negative.  Patient's vital signs are stable and plan is to reassess after IV fluids and the remainder of her workup is completed for ultimate disposition.      Arrival date & time: 06/17/24  1125     Patient presents with: Shortness of Breath and Chest Pain     Lorraine Conrad is a 54 y.o. female.     54 year old female history of asthma without intubations who presents to the emergency department with multiple complaints.  Several days ago started feeling poorly.  Subjective fevers and chills.  Cough, shortness of breath.  Also started developing right-sided chest pain is pleuritic.  Has developed nausea and vomiting and upper abdominal pain as well.  Was seen in urgent care on 1/7 and had a COVID and flu that were negative.  Given Decadron  and albuterol  and discharged home.  Says that the pain is getting worse.  Has developed more nausea and vomiting.  Was in triage and had an episode where she passed out.   Risk Prescription drug management.    54 year old female history of asthma without intubations who presents to the emergency department with multiple complaints.    Initial Ddx:  URI, asthma exacerbation, vasovagal syncope, dehydration, MI, PE   MDM/Course:  Patient presents to the emergency department with URI type symptoms as well as right-sided chest pain.  In triage start developing nausea and vomiting.  Sounds like she may have had a vasovagal  syncopal event.  When she came back to the room she was ill-appearing and diaphoretic.  Did appear quite uncomfortable.  Has wheezing bilaterally.  Has mild abdominal tenderness palpation as well.  In triage had an EKG that was unremarkable and high-sensitivity troponin that was undetectably low.  Her D-dimer was found to be elevated at 0.62 and she was ordered for a CTA of the chest.  With her abdominal pain did add on a CT of the abdomen.  Neither of these showed acute findings.  She was found to be influenza positive which I suspect is causing her symptoms.  Upon re-evaluation patient remained stable.  Signed out to the oncoming physician awaiting reassessment   This patient presents to the ED for concern of complaints listed in HPI, this involves an extensive number of treatment options, and is a complaint that carries with it a high risk of complications and morbidity. Disposition including potential need for admission considered.    Dispo: Pending remainder of workup   I have reviewed the patients home medications and made adjustments as needed Additional history obtained from spouse Records reviewed Outpatient Clinic Notes The following labs were independently interpreted: Chemistry and show no acute abnormality I independently reviewed the following imaging with scope of interpretation limited to determining acute life threatening conditions related to emergency care: Chest x-ray and agree with the radiologist interpretation with the following exceptions: none I personally reviewed and  interpreted cardiac monitoring: normal sinus rhythm  I personally reviewed and interpreted the pt's EKG: see above for interpretation    Portions of this note were generated with Dragon dictation software. Dictation errors may occur despite best attempts at proofreading.       Final diagnoses:  Exacerbation of asthma, unspecified asthma severity, unspecified whether persistent  Influenza      Pulmonlogist notes below on 20th   PLAN Patient Instructions        ICD-10-CM    1. Post-influenza syndrome  G93.31 B Nat Peptide      Troponin T, High Sensitivity (hs-TnT)      ECHOCARDIOGRAM COMPLETE     2. DOE (dyspnea on exertion)  R06.09 B Nat Peptide      Troponin T, High Sensitivity (hs-TnT)      ECHOCARDIOGRAM COMPLETE     3. Subacute cough  R05.2 B Nat Peptide      Troponin T, High Sensitivity (hs-TnT)      ECHOCARDIOGRAM COMPLETE     4. Atypical chest pain  R07.89 B Nat Peptide      Troponin T, High Sensitivity (hs-TnT)      ECHOCARDIOGRAM COMPLETE           Much of his symptoms are very consistent with post flow syndrome but there is significant symptom burden much greater than objective findings.  This is because the flu affects the body in a very cellular level.  Generally time and patients and trying to push yourself with good hydration and balancing rest and activity is the best approach   There is some fluid associated pulmonary inflammation in the lower lobes that could be part of the flu consistent with viral pneumonia but this was early January 2026.   Currently blood clot ruled out   You are on the right treatment protocol for any fluid related airway problems   Plan - Check blood work for BNP and troponin to make sure the heart is okay - Check echocardiogram - Continue and finish a prednisone  taper as outlined - Take sample Breztri and take it 2 puff 2 times daily for 4 weeks (take 2 samples] - You need a repeat CT chest without contrast in early April 2026   Follow-up - 2 to 3 weeks with nurse practitioner to report progress             - Follow-up CT can be ordered at that time - Go to ER if WORSE       Lorraine Conrad is a 54 year old female who presents with shortness of breath and cough following a recent flu diagnosis.  She developed severe right breast and axillary pain on December 27 and was started on amoxicillin  by her gyn,  but her follow-up was delayed when she contracted influenza. States next week will see her gyn. I strongly advised to keep that appointment/explained reasoning.  On January 7 she developed difficulty breathing and was seen at urgent care, where she received two treatments without improvement. Her symptoms worsened and she went to the ER on January 9 after vomiting and passing out twice(vasovagal), with reported episodes of not breathing.Ct did not show  PE. EKG did not show ischemia.  She tested positive for influenza in the ER and was treated with Tamiflu , prednisone , inhalers, and IV fluids, then discharged home.  Since discharge she has used a nebulizer three times daily. Pulmonologysaw for follow up and prescribed Tessalon  Perles,  Breztri additional inhaler, and advised continuation  of a prednisone  taper. She still has some daily dyspnea on exertion.  She is currently on a prednisone  taper at 20 mg, two tablets daily for five days, with about two and a half days remaining. She is using Breztri twice daily, a home nebulizer, and recently started an antibiotic for a urinary tract infection. She did not receive a flu vaccine this season. The syncopal episodes on January 9 were attributed to vasovagal syncope.          Review of Systems  Constitutional:  Negative for chills, fatigue and fever.  HENT:  Negative for congestion.   Respiratory:  Positive for cough and shortness of breath. Negative for chest tightness and wheezing.   Cardiovascular:  Negative for chest pain and palpitations.  Gastrointestinal:  Negative for abdominal pain.  Musculoskeletal:  Negative for back pain.  Skin:  Negative for rash.  Neurological:  Negative for dizziness, weakness and light-headedness.  Hematological:  Negative for adenopathy.  Psychiatric/Behavioral:  Negative for behavioral problems and dysphoric mood.     Past Medical History:  Diagnosis Date   Allergy    SEASONAL   Anemia    Arthritis     spine (03/01/2014)   Asthma    Chronic lower back pain    Endometriosis    Erosive gastritis    External hemorrhoids    GERD (gastroesophageal reflux disease)    H/O shortness of breath 2014   Cardiopulmonary exercise test results   Headache    probably monthly (03/01/2014)   Migraine 1998; 02/2014   this one's lasted 10 days straight (03/01/2014)   OSA on CPAP    Ovarian cyst, left    Proctalgia fugax    Sleep apnea    on cpap   Thyroid  disease    thyroid  nodule     Social History   Socioeconomic History   Marital status: Married    Spouse name: Not on file   Number of children: 2   Years of education: Not on file   Highest education level: Not on file  Occupational History   Occupation: homemaker  Tobacco Use   Smoking status: Never   Smokeless tobacco: Never   Tobacco comments:    father smoked in house growing up per pt.   Vaping Use   Vaping status: Never Used  Substance and Sexual Activity   Alcohol use: Yes    Alcohol/week: 0.0 standard drinks of alcohol    Comment: 12-23-16 I'll have 1-2 drinks maybe 3-4 times/yr   Drug use: No   Sexual activity: Yes    Birth control/protection: Surgical  Other Topics Concern   Not on file  Social History Narrative   Daily caffiene use: 6 cups per day   Patient does not get regular excercise   Social Drivers of Health   Tobacco Use: Low Risk (06/28/2024)   Patient History    Smoking Tobacco Use: Never    Smokeless Tobacco Use: Never    Passive Exposure: Not on file  Financial Resource Strain: Low Risk (09/23/2022)   Received from [facility]   Overall Financial Resource Strain (CARDIA)    Difficulty of Paying Living Expenses: Not hard at all  Food Insecurity: No Food Insecurity (09/23/2022)   Received from [facility]   Epic    Within the past 12 months, you worried that your food would run out before you got the money to buy more.: Never true    Within the past 12 months, the food you bought  just didn't  last and you didn't have money to get more.: Never true  Transportation Needs: No Transportation Needs (09/23/2022)   Received from [facility]   PRAPARE - Transportation    Lack of Transportation (Medical): No    Lack of Transportation (Non-Medical): No  Physical Activity: Not on file  Stress: Not on file  Social Connections: Unknown (10/18/2021)   Received from [facility]   Social Network    Social Network: Not on file  Intimate Partner Violence: Unknown (09/09/2021)   Received from [facility]   HITS    Physically Hurt: Not on file    Insult or Talk Down To: Not on file    Threaten Physical Harm: Not on file    Scream or Curse: Not on file  Depression (PHQ2-9): Low Risk (06/29/2024)   Depression (PHQ2-9)    PHQ-2 Score: 0  Alcohol Screen: Not on file  Housing: Low Risk (06/24/2024)   Received from [facility]   Epic    What is your living situation today?: I have a steady place to live    Think about the place you live. Do you have problems with any of the following? Choose all that apply:: Not on file  Utilities: Not At Risk (09/23/2022)   Received from [facility] Utilities    Threatened with loss of utilities: No  Health Literacy: Not on file    Past Surgical History:  Procedure Laterality Date   ABDOMINAL HYSTERECTOMY  04/2013   BREAST BIOPSY Bilateral 10/05/2000   BREAST BIOPSY Right 08/26/2019   Fibroadenoma   BREAST EXCISIONAL BIOPSY Left 1991   CESAREAN SECTION  2002; 2006   COLONOSCOPY     LAPAROSCOPIC OVARIAN CYSTECTOMY  ~ 1999   NASAL SEPTUM SURGERY  ~ 1991   OOPHORECTOMY Right 2015   TEMPOROMANDIBULAR JOINT SURGERY  ~ 1992   UPPER GASTROINTESTINAL ENDOSCOPY      Family History  Problem Relation Age of Onset   Diverticulitis Mother    Colon polyps Father    Heart disease Father    Colon polyps Paternal Grandmother    Breast cancer Paternal Grandmother    Prostate cancer Paternal Grandfather    Colon polyps Paternal Grandfather     Colon cancer Neg Hx    Esophageal cancer Neg Hx    Rectal cancer Neg Hx    Stomach cancer Neg Hx    Pancreatic cancer Neg Hx     Allergies[1]  Medications Ordered Prior to Encounter[2]  BP 110/70   Pulse 99   Temp 98.5 F (36.9 C) (Oral)   Resp 16   Ht 5' 4 (1.626 m)   Wt 238 lb (108 kg)   LMP 02/08/2013   SpO2 95%   BMI 40.85 kg/m        Objective:   Physical Exam  General Mental Status- Alert. General Appearance- Not in acute distress.   Skin General: Color- Normal Color. Moisture- Normal Moisture.  Neck  No JVD.  Chest and Lung Exam Auscultation: Breath Sounds:-CTA. But mild shallow  Cardiovascular Auscultation:Rythm- RRR Murmurs & Other Heart Sounds:Auscultation of the heart reveals- No Murmurs.  Abdomen Inspection:-Inspeection Normal. Palpation/Percussion:Note:No mass. Palpation and Percussion of the abdomen reveal- Non Tender, Non Distended + BS, no rebound or guarding.   Neurologic Cranial Nerve exam:- CN III-XII intact(No nystagmus), symmetric smile. Strength:- 5/5 equal and symmetric strength both upper and lower extremities.   Lower ext- calfs symmetric, negative homans signs. No pedal edema bilaterally.  Assessment & Plan:   Asthma exacerbation(moderate post flu) Post-influenza A infection with shortness of breath and cough.Pulmonologist recommended echocardiogram to assess heart function post-flu. Test will be done tomorrow. - Continue prednisone  taper as prescribed. - Use Breztri two plus inhaler twice daily for four weeks. - Use albuterol  nebulizer three times daily as needed. -continue prednisone  20 mg bid but update me how many tabs you have left as may give you 5-7 days more but lower dose as we approach weekend and incoming snow. - Send MyChart message by Friday to confirm prednisone  supply and adjust prescription if necessary.  Influenza A infection Confirmed on January 9th. Symptoms included shortness of breath, cough, and  vasovagal syncope. Treated with Tamiflu  and prednisone . - Continue Tamiflu  as prescribed. - Monitor symptoms and follow up with pulmonologist in 2-3 weeks.  Lymphadenopathy Right axillary region, initially treated with amoxicillin . Symptoms persist. - Follow up with gynecologist for re-evaluation of axillary lymphadenopathy.  Urinary tract infection Recent infection treated with antibiotics.  Follow up 2-3 weeks with pulmonologist or sooner  if needed   I personally spent a total of 43 minutes in the care of the patient today including getting/reviewing separately obtained history, performing a medically appropriate exam/evaluation, counseling and educating, placing orders, and documenting clinical information in the EHR.     [1]  Allergies Allergen Reactions   Promethazine Hcl Other (See Comments)    Violent tremors    Azithromycin Rash    Head to toe rash   Cephalexin  Diarrhea and Itching   Codeine Other (See Comments)    Was a child when she took this.   Dilaudid [Hydromorphone] Nausea And Vomiting   Soy Allergy (Obsolete) Itching  [2]  Current Outpatient Medications on File Prior to Visit  Medication Sig Dispense Refill   acetaminophen  (TYLENOL ) 500 MG tablet Take 500-1,000 mg by mouth every 6 (six) hours as needed for moderate pain (pain score 4-6).     albuterol  (VENTOLIN  HFA) 108 (90 Base) MCG/ACT inhaler Inhale 2 puffs into the lungs every 6 (six) hours as needed for wheezing or shortness of breath. 18 g 2   albuterol  (VENTOLIN  HFA) 108 (90 Base) MCG/ACT inhaler Inhale 2 puffs into the lungs every 4 (four) hours as needed for wheezing or shortness of breath. 1 each 0   benzonatate  (TESSALON ) 100 MG capsule Take 2 capsules (200 mg total) by mouth 3 (three) times daily as needed for cough. 30 capsule 1   cyclobenzaprine  (FLEXERIL ) 5 MG tablet Take 1 tablet (5 mg total) by mouth at bedtime. 3 tablet 0   cyclobenzaprine  (FLEXERIL ) 5 MG tablet 1 tab po q hs prn muscle spasms  5 tablet 0   fluticasone  (FLONASE ) 50 MCG/ACT nasal spray SPRAY 2 SPRAYS INTO EACH NOSTRIL EVERY DAY (Patient taking differently: Place 2 sprays into both nostrils daily as needed for allergies or rhinitis.) 16 mL 5   ipratropium-albuterol  (DUONEB) 0.5-2.5 (3) MG/3ML SOLN Inhale 3 mLs into the lungs.     montelukast  (SINGULAIR ) 10 MG tablet Take 10 mg by mouth at bedtime.     omeprazole  (PRILOSEC) 40 MG capsule Take 40 mg by mouth.     ondansetron  (ZOFRAN ) 4 MG tablet Take 1 tablet (4 mg total) by mouth every 8 (eight) hours as needed for nausea or vomiting. 20 tablet 0   predniSONE  (DELTASONE ) 50 MG tablet Take 1 tab by mouth daily 4 tablet 0   sulfamethoxazole -trimethoprim  (BACTRIM  DS) 800-160 MG tablet Take 1 tablet by mouth.  aspirin  EC 81 MG tablet Take 1 tablet (81 mg total) by mouth daily. Swallow whole. (Patient not taking: Reported on 06/29/2024)     atorvastatin  (LIPITOR) 10 MG tablet TAKE 1 TABLET BY MOUTH EVERY DAY (Patient not taking: Reported on 06/29/2024) 30 tablet 8   Azelastine  HCl 137 MCG/SPRAY SOLN PLACE 1 SPRAY INTO BOTH NOSTRILS 2 TIMES DAILY AS DIRECTED (Patient not taking: Reported on 06/29/2024) 30 mL 2   budesonide -formoterol  (SYMBICORT ) 160-4.5 MCG/ACT inhaler Inhale 2 puffs into the lungs 2 (two) times daily. (Patient not taking: Reported on 06/29/2024) 1 each 12   busPIRone  (BUSPAR ) 7.5 MG tablet TAKE 1 TABLET BY MOUTH 2 TIMES DAILY. (Patient not taking: Reported on 06/29/2024) 60 tablet 2   ciclopirox  (PENLAC ) 8 % solution Apply topically at bedtime. Apply over nail and surrounding skin. Apply daily over previous coat. After seven (7) days, may remove with alcohol and continue cycle. (Patient not taking: Reported on 06/29/2024) 6.6 mL 0   clonazePAM  (KLONOPIN ) 0.5 MG tablet Take 1 tablet (0.5 mg total) by mouth 2 (two) times daily as needed for anxiety. (Patient not taking: Reported on 06/29/2024) 8 tablet 0   diclofenac  (VOLTAREN ) 75 MG EC tablet Take 1 tablet (75 mg total)  by mouth 2 (two) times daily. (Patient not taking: Reported on 06/29/2024) 20 tablet 1   famotidine  (PEPCID ) 20 MG tablet Take 1 tablet (20 mg total) by mouth 2 (two) times daily as needed for heartburn or indigestion. (Patient not taking: Reported on 06/29/2024) 60 tablet 3   lansoprazole (PREVACID) 30 MG capsule Take 30 mg by mouth daily as needed (Indigestion). (Patient not taking: Reported on 06/29/2024)     levocetirizine (XYZAL ) 5 MG tablet Take 5 mg by mouth daily as needed for allergies. (Patient not taking: Reported on 06/29/2024)     methylPREDNISolone  (MEDROL ) 4 MG tablet Standard 6 day taper dose pack (Patient not taking: Reported on 06/29/2024) 21 tablet 0   oseltamivir  (TAMIFLU ) 75 MG capsule Take 1 capsule (75 mg total) by mouth every 12 (twelve) hours. (Patient not taking: Reported on 06/29/2024) 10 capsule 0   pantoprazole  (PROTONIX ) 40 MG tablet Take 1 tablet (40 mg total) by mouth daily. (Patient not taking: Reported on 06/29/2024) 90 tablet 3   SUMAtriptan  (IMITREX ) 50 MG tablet Take 1 tablet (50 mg total) by mouth every 2 (two) hours as needed for migraine. May repeat in 2 hours if headache persists or recurs. (Patient not taking: Reported on 06/29/2024) 10 tablet 0   vitamin B-12 (CYANOCOBALAMIN ) 100 MCG tablet Take 100 mcg by mouth daily. (Patient not taking: Reported on 06/29/2024)     No current facility-administered medications on file prior to visit.   "

## 2024-06-30 ENCOUNTER — Ambulatory Visit (HOSPITAL_COMMUNITY)
Admission: RE | Admit: 2024-06-30 | Discharge: 2024-06-30 | Disposition: A | Discharge status: Home / Self Care | Source: Ambulatory Visit | Attending: Internal Medicine | Admitting: Internal Medicine

## 2024-06-30 DIAGNOSIS — R0609 Other forms of dyspnea: Secondary | ICD-10-CM

## 2024-06-30 DIAGNOSIS — R0600 Dyspnea, unspecified: Secondary | ICD-10-CM | POA: Diagnosis present

## 2024-06-30 DIAGNOSIS — R052 Subacute cough: Secondary | ICD-10-CM | POA: Insufficient documentation

## 2024-06-30 DIAGNOSIS — G9331 Postviral fatigue syndrome: Secondary | ICD-10-CM | POA: Insufficient documentation

## 2024-06-30 DIAGNOSIS — R0789 Other chest pain: Secondary | ICD-10-CM | POA: Diagnosis not present

## 2024-06-30 LAB — ECHOCARDIOGRAM COMPLETE
Area-P 1/2: 3.51 cm2
Calc EF: 70.5 %
S' Lateral: 3 cm
Single Plane A2C EF: 74.1 %
Single Plane A4C EF: 73.1 %

## 2024-06-30 MED ORDER — PREDNISONE 20 MG PO TABS: 20.0000 mg | ORAL_TABLET | Freq: Every day | ORAL | 0 refills | Status: DC

## 2024-06-30 NOTE — Addendum Note (Signed)
 Addended by: DORINA DALLAS DORINA PA-C M on: 06/30/2024 09:47 AM   Modules accepted: Orders

## 2024-06-30 NOTE — Telephone Encounter (Signed)
 Sent to front desk to make appt today after Echo is competed

## 2024-06-30 NOTE — Telephone Encounter (Signed)
 Not sure why an extra tube was drawn; but did you get all that you ordered?    Component Ref Range & Units (hover) 2 d ago  Extra tube recieved   Comment: An extra specimen was received with no test requested. The specimen will be maintained in storage in case additional testing is needed. Please call the client service department for further assistance. .      Copied from CRM [id number]. Topic: Clinical - Request for Lab/Test Order >> Jun 29, 2024  8:12 AM Ismael A wrote: Reason for CRM: pt called in regarding extra specimen notification she received from labs drawn yesterday, she had labs drawn for troponin I and B Nat Peptide, she wants to confirm no other labs were requested - I did advise there was a requested for Troponin T high sensitivity that was originally requested but was cancelled out and that may be why an extra was taken

## 2024-07-01 ENCOUNTER — Encounter: Payer: Self-pay | Admitting: Cardiology

## 2024-07-01 ENCOUNTER — Other Ambulatory Visit: Payer: Self-pay

## 2024-07-01 ENCOUNTER — Ambulatory Visit: Attending: Cardiology | Admitting: Cardiology

## 2024-07-01 VITALS — BP 130/90 | HR 83 | Ht 64.0 in | Wt 237.0 lb

## 2024-07-01 DIAGNOSIS — R079 Chest pain, unspecified: Secondary | ICD-10-CM | POA: Diagnosis not present

## 2024-07-01 DIAGNOSIS — I2510 Atherosclerotic heart disease of native coronary artery without angina pectoris: Secondary | ICD-10-CM

## 2024-07-01 DIAGNOSIS — M2550 Pain in unspecified joint: Secondary | ICD-10-CM

## 2024-07-01 DIAGNOSIS — G4733 Obstructive sleep apnea (adult) (pediatric): Secondary | ICD-10-CM | POA: Diagnosis not present

## 2024-07-01 MED ORDER — ALBUTEROL SULFATE HFA 108 (90 BASE) MCG/ACT IN AERS: 2.0000 | INHALATION_SPRAY | Freq: Four times a day (QID) | RESPIRATORY_TRACT | 2 refills | Status: AC | PRN

## 2024-07-01 NOTE — Progress Notes (Unsigned)
 " Cardiology Office Note:    Date:  07/01/2024   ID:  Lorraine Conrad, DOB 05-07-1971, MRN [id number]  PCP:  Dorina Loving, PA-C  Cardiologist:  Lamar Fitch, MD    Referring MD: Dorina Loving, [place]   Chief Complaint  Patient presents with   Shortness of Breath    History of Present Illness:     Lorraine Conrad is a 54 y.o. female past medical history significant for GERD, dyslipidemia she was referred originally to us  in December of this last year because of atypical chest pain, coronary CT angio has been performed calcium  score 0, plaque volume only 15 mm, she was referred back to vascular because she is not doing well.  Apparently in January 7 she end up having flu multiple visits and urgent care as well as emergency room.  The biggest complaint is shortness of breath not feeling well.  She did have an extensive evaluation done which included chest CT, proBNP normal, troponins normal, EKG no acute changes, she also seen pulmonologist and conclusion was that she most likely had post flu syndrome.  She looks quite disturbed when I walk into the room she was clearly having some shortness of breath her husband is with her in the room and he participated in decision making.  Look like she did not feel well since October she does have those episode that she started getting sick weak tired exhausted and it is fluctuating however this episode that she got right now since 7 January seems to be lasting particularly long.  She described to have pains all over including some pain to touch.  Past Medical History:  Diagnosis Date   Allergy    SEASONAL   Anemia    Arthritis    spine (03/01/2014)   Asthma    Chronic lower back pain    Endometriosis    Erosive gastritis    External hemorrhoids    GERD (gastroesophageal reflux disease)    H/O shortness of breath 2014   Cardiopulmonary exercise test results   Headache    probably monthly (03/01/2014)   Migraine 1998; 02/2014   this one's  lasted 10 days straight (03/01/2014)   OSA on CPAP    Ovarian cyst, left    Proctalgia fugax    Sleep apnea    on cpap   Thyroid  disease    thyroid  nodule    Past Surgical History:  Procedure Laterality Date   ABDOMINAL HYSTERECTOMY  04/2013   BREAST BIOPSY Bilateral 10/05/2000   BREAST BIOPSY Right 08/26/2019   Fibroadenoma   BREAST EXCISIONAL BIOPSY Left 1991   CESAREAN SECTION  2002; 2006   COLONOSCOPY     LAPAROSCOPIC OVARIAN CYSTECTOMY  ~ 1999   NASAL SEPTUM SURGERY  ~ 1991   OOPHORECTOMY Right 2015   TEMPOROMANDIBULAR JOINT SURGERY  ~ 1992   UPPER GASTROINTESTINAL ENDOSCOPY      Current Medications: Active Medications[1]   Allergies:   Promethazine hcl, Azithromycin, Cephalexin , Codeine, Dilaudid [hydromorphone], and Soy allergy (obsolete)   Social History   Socioeconomic History   Marital status: Married    Spouse name: Not on file   Number of children: 2   Years of education: Not on file   Highest education level: Not on file  Occupational History   Occupation: homemaker  Tobacco Use   Smoking status: Never   Smokeless tobacco: Never   Tobacco comments:    father smoked in house growing up per pt.   Vaping Use  Vaping status: Never Used  Substance and Sexual Activity   Alcohol use: Yes    Alcohol/week: 0.0 standard drinks of alcohol    Comment: 12-23-16 I'll have 1-2 drinks maybe 3-4 times/yr   Drug use: No   Sexual activity: Yes    Birth control/protection: Surgical  Other Topics Concern   Not on file  Social History Narrative   Daily caffiene use: 6 cups per day   Patient does not get regular excercise   Social Drivers of Health   Tobacco Use: Low Risk (07/01/2024)   Patient History    Smoking Tobacco Use: Never    Smokeless Tobacco Use: Never    Passive Exposure: Not on file  Financial Resource Strain: Low Risk (09/23/2022)   Received from [facility]   Overall Financial Resource Strain (CARDIA)    Difficulty of Paying Living  Expenses: Not hard at all  Food Insecurity: No Food Insecurity (09/23/2022)   [facility]   Epic    Within the past 12 months, you worried that your food would run out before you got the money to buy more.: Never true    Within the past 12 months, the food you bought just didn't last and you didn't have money to get more.: Never true  Transportation Needs: No Transportation Needs (09/23/2022)   Received from [facility] - Transportation    Lack of Transportation (Medical): No    Lack of Transportation (Non-Medical): No  Physical Activity: Not on file  Stress: Not on file  Social Connections: Unknown (10/18/2021)   Received from [facility]   Social Network    Social Network: Not on file  Depression (PHQ2-9): Low Risk (06/29/2024)   Depression (PHQ2-9)    PHQ-2 Score: 0  Alcohol Screen: Not on file  Housing: Low Risk (06/24/2024)   Received from [facility]   Epic    What is your living situation today?: I have a steady place to live    Think about the place you live. Do you have problems with any of the following? Choose all that apply:: Not on file  Utilities: Not At Risk (09/23/2022)   Received from [facility] Utilities    Threatened with loss of utilities: No  Health Literacy: Not on file     Family History: The patient's family history includes Breast cancer in her paternal grandmother; Colon polyps in her father, paternal grandfather, and paternal grandmother; Diverticulitis in her mother; Heart disease in her father; Prostate cancer in her paternal grandfather. There is no history of Colon cancer, Esophageal cancer, Rectal cancer, Stomach cancer, or Pancreatic cancer. ROS:   Please see the history of present illness.    All 14 point review of systems negative except as described per history of present illness  EKGs/Labs/Other Studies Reviewed:         Recent Labs: 07/22/2023: ALT 17; TSH 2.30 06/17/2024: BUN 14; Creatinine, Ser 1.07;  Hemoglobin 12.8; Platelets 366; Potassium 3.9; Sodium 139 06/28/2024: Pro B Natriuretic peptide (BNP) 16.0  Recent Lipid Panel    Component Value Date/Time   CHOL 164 10/14/2022 0958   TRIG 110.0 10/14/2022 0958   HDL 46.10 10/14/2022 0958   CHOLHDL 4 10/14/2022 0958   VLDL 22.0 10/14/2022 0958   LDLCALC 96 10/14/2022 0958    Physical Exam:    VS:  BP (!) 130/90   Pulse 83   Ht 5' 4 (1.626 m)   Wt 237 lb (107.5 kg)  LMP 02/08/2013   SpO2 96%   BMI 40.68 kg/m     Wt Readings from Last 3 Encounters:  07/01/24 237 lb (107.5 kg)  06/29/24 238 lb (108 kg)  06/28/24 238 lb 9.6 oz (108.2 kg)     GEN:  Well nourished, well developed in no acute distress HEENT: Normal NECK: No JVD; No carotid bruits LYMPHATICS: No lymphadenopathy CARDIAC: RRR, no murmurs, no rubs, no gallops RESPIRATORY:  Clear to auscultation without rales, wheezing or rhonchi  ABDOMEN: Soft, non-tender, non-distended MUSCULOSKELETAL:  No edema; No deformity  SKIN: Warm and dry LOWER EXTREMITIES: no swelling NEUROLOGIC:  Alert and oriented x 3 PSYCHIATRIC:  Normal affect   ASSESSMENT:    1. Chest pain, unspecified type   2. Coronary artery disease involving native coronary artery of native heart without angina pectoris   3. OSA (obstructive sleep apnea)   4. Arthralgia, unspecified joint   5. Morbid obesity (HCC)    PLAN:    In order of problems listed above:  Chest pain coronary CT angio done just few months ago showing only minimal disease, troponins negative EKG will repeat but so far no acute changes.  I do not think her symptomatology have anything to do with coronary artery disease. Dyspnea exertion/shortness of breath.  Saturation is good, seen by pulmonologist, bronchodilators given as well as some steroids.  Minimal improvement.  CT of the chest done, no PE, overall quite disturbing scenario. Arthralgias with overall poor wellbeing.  I think we need to go a different route here.  She may  require to see a rheumatologist, also will be important to check for Lyme disease and potential mild [place] fever with her complex and nonspecific symptomatology that has been going on for months already.  I suspect there may be some psychological component to all this clinical study as well but before we generally go this route all organic causes for his symptomatology need to be rule out.  Regardless I do not see any cardiac explanation for symptoms.     Medication Adjustments/Labs and Tests Ordered: Current medicines are reviewed at length with the patient today.  Concerns regarding medicines are outlined above.  Orders Placed This Encounter  Procedures   EKG 12-Lead   Medication changes: No orders of the defined types were placed in this encounter.   Signed, Lamar DOROTHA Fitch, MD, [facility] 07/01/2024 10:07 AM    [facility]     [1]  Current Meds  Medication Sig   acetaminophen  (TYLENOL ) 500 MG tablet Take 500-1,000 mg by mouth every 6 (six) hours as needed for moderate pain (pain score 4-6).   albuterol  (VENTOLIN  HFA) 108 (90 Base) MCG/ACT inhaler Inhale 2 puffs into the lungs every 6 (six) hours as needed for wheezing or shortness of breath.   benzonatate  (TESSALON ) 100 MG capsule Take 2 capsules (200 mg total) by mouth 3 (three) times daily as needed for cough.   cyclobenzaprine  (FLEXERIL ) 5 MG tablet 1 tab po q hs prn muscle spasms   ipratropium-albuterol  (DUONEB) 0.5-2.5 (3) MG/3ML SOLN Inhale 3 mLs into the lungs.   levocetirizine (XYZAL ) 5 MG tablet Take 5 mg by mouth daily as needed for allergies.   montelukast  (SINGULAIR ) 10 MG tablet Take 10 mg by mouth at bedtime.   omeprazole  (PRILOSEC) 40 MG capsule Take 40 mg by mouth.   ondansetron  (ZOFRAN ) 4 MG tablet Take 1 tablet (4 mg total) by mouth every 8 (eight) hours as needed for nausea  or vomiting.   predniSONE  (DELTASONE ) 20 MG tablet Take 1 tablet (20 mg total) by mouth daily with  breakfast.   sulfamethoxazole -trimethoprim  (BACTRIM  DS) 800-160 MG tablet Take 1 tablet by mouth.   SUMAtriptan  (IMITREX ) 50 MG tablet Take 1 tablet (50 mg total) by mouth every 2 (two) hours as needed for migraine. May repeat in 2 hours if headache persists or recurs.   "

## 2024-07-01 NOTE — Patient Instructions (Signed)
 Medication Instructions:  Your physician recommends that you continue on your current medications as directed. Please refer to the Current Medication list given to you today.  *If you need a refill on your cardiac medications before your next appointment, please call your pharmacy*   Lab Work: None Ordered If you have labs (blood work) drawn today and your tests are completely normal, you will receive your results only by: MyChart Message (if you have MyChart) OR A paper copy in the mail If you have any lab test that is abnormal or we need to change your treatment, we will call you to review the results.   Testing/Procedures: None Ordered   Follow-Up: At [facility], you and your health needs are our priority.  As part of our continuing mission to provide you with exceptional heart care, we have created designated Provider Care Teams.  These Care Teams include your primary Cardiologist (physician) and Advanced Practice Providers (APPs -  Physician Assistants and Nurse Practitioners) who all work together to provide you with the care you need, when you need it.  We recommend signing up for the patient portal called MyChart.  Sign up information is provided on this After Visit Summary.  MyChart is used to connect with patients for Virtual Visits (Telemedicine).  Patients are able to view lab/test results, encounter notes, upcoming appointments, etc.  Non-urgent messages can be sent to your provider as well.   To learn more about what you can do with MyChart, go to [email or web address].    Your next appointment:   2 month(s)  The format for your next appointment:   In Person  Provider:   Lamar Fitch, MD    Other Instructions

## 2024-07-05 NOTE — Progress Notes (Signed)
 NOrmal ECHO.  Says the right side of the heart is hyperdynamic which means it is beating a little more vigorously.  Do not know what it means but overall I find this to be a reassuring results.  Will see how his symptoms evolve over the next few weeks.  Certainly if you are feeling worse do let us  know.  The markers for heart failure and heart attack were all normal

## 2024-07-06 NOTE — Telephone Encounter (Signed)
 Please advise.

## 2024-07-07 NOTE — Progress Notes (Signed)
 CC: Follow-up for right axillary pain  Subjective:    HPI: Lorraine Conrad is a 54 y.o. y/o G2P1102 who presents today for evaluation of a breast mass.   She reports she first noticed the area  4   wks ago.  Patient's right axillary pain now has improved since she took antibiotics.  Patient did have the flu since then and has had some workup of her heart which have all come back negative.  Pt has no other symptoms.   She denies breast pain, nipple discharge, erythema, fever, and change with her cycles.   Her risk factors for breast cancer include: No  Patient has not had a mammogram.   Current Medications[1]  Allergies[2]  Medical History[3]  Surgical History[4]  OB History  Gravida Para Term Preterm AB Living  2 2 1 1  0 2  SAB IAB Ectopic Molar Multiple Live Births  0 0 0 0      # Outcome Date GA Lbr Len/2nd Weight Sex Type Anes PTL Lv  2 Preterm      CS-LTranv     1 Term      CS-LTranv       Social History   Socioeconomic History   Marital status: Married    Spouse name: Not on file   Number of children: Not on file   Years of education: Not on file   Highest education level: Not on file  Occupational History   Not on file  Tobacco Use   Smoking status: Never   Smokeless tobacco: Never  Vaping Use   Vaping status: Never Used  Substance and Sexual Activity   Alcohol use: Yes    Alcohol/week: 1.0 standard drink of alcohol   Drug use: No   Sexual activity: Not Currently    Birth control/protection: Surgical  Other Topics Concern   Not on file  Social History Narrative   Not on file   Social Drivers of Health   Living Situation: Low Risk (06/24/2024)   Living Situation    What is your living situation today?: I have a steady place to live    Think about the place you live. Do you have problems with any of the following? Choose all that apply:: Not on file  Food Insecurity: No Food Insecurity (09/23/2022)   Received from [facility]   Food vital sign    Within the past 12 months, you worried that your food would run out before you got money to buy more: Never true    Within the past 12 months, the food you bought just didn't last and you didn't have money to get more: Never true  Transportation Needs: No Transportation Needs (09/23/2022)   Received from [facility] - Transportation    Lack of Transportation (Medical): No    Lack of Transportation (Non-Medical): No  Utilities: Not At Risk (09/23/2022)   Received from [facility] Utilities    Threatened with loss of utilities: No  Safety: Low Risk (06/27/2024)   Safety    How often does anyone, including family and friends, physically hurt you?: Never    How often does anyone, including family and friends, insult or talk down to you?: Never    How often does anyone, including family and friends, threaten you with harm?: Never    How often does anyone, including family and friends, scream or curse at you?: Never  Alcohol Screening: Not on file  Tobacco Use: Low  Risk (07/02/2024)   Patient History    Smoking Tobacco Use: Never    Smokeless Tobacco Use: Never    Passive Exposure: Not on file  Depression: Not At Risk (07/02/2024)   PHQ-2    PHQ-2 Score: 0  Social Connections: Unknown (10/18/2021)   Received from [facility]   Social Network    Social Network: Not on file  Financial Resource Strain: Low Risk (09/23/2022)   Received from [facility]   Overall Financial Resource Strain (CARDIA)    Difficulty of Paying Living Expenses: Not hard at all    Family History[5]   ROS: General: no fever, fatigue, weight or appetite changes Skin: no rashes, lesions, itching, mole change HEENT: no frequent headaches, hearing changes, sore throat, dental problems, sinus problems   Lymph: no tender or swollen lymph nodes  Resp: no SOB, cough, wheezing Cardio: no chest pain, palpitations, edema GI: no N/V/D, constipation,  anorexia, bloating, reflux, blood in stool, leakage of stool  GU: no frequency, dysuria, flank pain, hematuria, incontinence, retention       GYN: no change in discharge or odor, itching, inter-menstrual or post-coital bleeding, dyspareunia  Musk: no weakness, arthralgia, joint swelling, ROM limitations Psych: no depression, anxiety, panic attacks   Objective:   PHYSICAL EXAM: BP 134/83   Wt 109 kg (241 lb)   BMI 41.37 kg/m  Constitutional: Well-developed, well-nourished female in no acute distress Neurological: Alert and oriented to person, place, and time Psychiatric: Mood and affect appropriate Skin: No rashes or lesions  Left Breast- Normal appearance , Inspection negative,  No masses  or tenderness., No nipple retraction, dimpling, discharge, or bleeding;No axillary or supraclavicular adenopathy    Right Breast-  Normal appearance , Inspection negative,  No masses  or tenderness., No nipple retraction, dimpling, discharge, or bleeding;No axillary or supraclavicular adenopathy  Chaperone present during exam.      Assessment/Plan:    Assessment: Right sided axillary pain  Plan:  Patient's pain is now improved and told to follow-up as needed.       Physical exam negative for any palpable mass      [1]  Current Outpatient Medications:    acetaminophen  (TYLENOL ) 500 mg tablet, Take 500-1,000 mg by mouth., Disp: , Rfl:    albuterol  HFA (PROVENTIL  HFA;VENTOLIN  HFA;PROAIR  HFA) 90 mcg/actuation inhaler, Inhale 2 puffs every 4 (four) hours as needed for wheezing or shortness of breath., Disp: 25.5 g, Rfl: 0   aspirin  81 mg EC tablet, Take 81 mg by mouth daily. swallow whole, Disp: , Rfl:    atorvastatin  (LIPITOR) 10 mg tablet, Take 10 mg by mouth daily., Disp: , Rfl:    azelastine  (ASTELIN ) 137 mcg (0.1 %) nasal spray, 2 sprays as needed., Disp: , Rfl:    budesonide -formoteroL  (SYMBICORT ;BREYNA ) 160-4.5 mcg/actuation inhaler, Inhale 2 puffs in the morning and 2  puffs before bedtime., Disp: 1 each, Rfl: 0   cefdinir  (OMNICEF ) 300 mg capsule, Take 1 capsule (300 mg total) by mouth 2 (two) times a day for 7 days., Disp: 14 capsule, Rfl: 0   cyanocobalamin  (VITAMIN B12) 100 mcg tablet, Take 100 mcg by mouth daily., Disp: , Rfl:    diclofenac  (VOLTAREN ) 75 mg EC tablet, Take 75 mg by mouth 2 (two) times a day., Disp: , Rfl:    estradioL  (ESTRACE ) 0.01 % (0.1 mg/gram) vaginal cream, Apply to Vagina nightly Mondays and Thursdays at bedtime, Disp: 42.5 g, Rfl: 11   fluticasone  propionate (FLONASE ) 50 mcg/spray nasal spray, 2 sprays as  needed., Disp: , Rfl:    fluticasone  propionate 0.05 % crea, APPLY TO AFFECTED AREA UP TO TWICE A DAY AS NEEDED, Disp: , Rfl:    ipratropium-albuterol  (DUO-NEB) 0.5-2.5 mg/3 mL nebulizer solution, Take 3 mL by nebulization in the morning and 3 mL at noon and 3 mL in the evening., Disp: 270 mL, Rfl: 0   levocetirizine (XYZAL ) 5 mg tablet, Take 5 mg by mouth daily as needed for allergies., Disp: , Rfl:    loratadine  (CLARITIN ) 10 mg tablet, Take 10 mg by mouth., Disp: , Rfl:    methocarbamoL  (ROBAXIN ) 500 mg tablet, TAKE 1 TABLET BY MOUTH EVERY SIX TO EIGHT HOURS AS NEEDED FOR SPASM, Disp: , Rfl:    montelukast  (SINGULAIR ) 10 mg tablet, Take 1 tablet (10 mg total) by mouth at bedtime., Disp: 30 tablet, Rfl: 0   omeprazole  (PriLOSEC) 40 mg DR capsule, Take 1 capsule (40 mg total) by mouth in the morning., Disp: 30 capsule, Rfl: 0   celecoxib  (CeleBREX ) 200 mg capsule, Take 200 mg by mouth. (Patient not taking: Reported on 07/07/2024), Disp: , Rfl:    desonide (DESOWEN) 0.05 % cream, APPLY TO AFFECTED AREA(S) ON EYELID(S) TWICE DAILY AS NEEDED (Patient not taking: Reported on 07/07/2024), Disp: , Rfl:    estradioL  (Vivelle -Dot) 0.05 mg/24 hr, Place patch on skin on Mondays and Thursdays (Patient not taking: Reported on 07/07/2024), Disp: 24 patch, Rfl: 3   guaiFENesin (Mucinex) 600 mg 12 hr tablet, Take 600 mg by mouth as  needed. (Patient not taking: Reported on 07/07/2024), Disp: , Rfl:    lansoprazole (PREVACID) 30 mg delayed-release capsule, Take 30 mg by mouth daily as needed. (Patient not taking: Reported on 07/07/2024), Disp: , Rfl:    triamcinolone  (KENALOG) 0.1 % cream, APPLY TO AFFECTED AREA UP TO TWICE A DAY AS NEEDED NOT TO FACE,GROIN,UNDERARMS (Patient not taking: Reported on 07/07/2024), Disp: , Rfl:  [2] Allergies Allergen Reactions   Soy Itching   Promethazine Hcl Other (See Comments)    Violent tremors   Codeine Other (See Comments)    Other reaction(s): Other (See Comments) Unknown, Was a child when she took this., Other reaction(s): Other (See Comments), Was a child when she took this.   Azithromycin Rash    Head to toe rash, Head to toe rash   Cephalexin  Diarrhea and Itching   Hydromorphone Hcl GI Intolerance    Dilaudid  [3] Past Medical History: Diagnosis Date   Acid reflux    Asthma (CMD)   [4] Past Surgical History: Procedure Laterality Date   BREAST BIOPSY     Procedure: BREAST BIOPSY   CESAREAN SECTION, UNSPECIFIED     Procedure: CESAREAN SECTION; Two C sections    HYSTERECTOMY      Procedure: HYSTERECTOMY   SINUS SURGERY     Procedure: SINUS SURGERY   TEMPOROMANDIBULAR JOINT SURGERY     Procedure: TEMPOROMANDIBULAR JOINT SURGERY   WISDOM TOOTH EXTRACTION     Procedure: WISDOM TOOTH EXTRACTION  [5] Family History Problem Relation Name Age of Onset   Allergies Mother     Hypertension Mother     Thyroid  disease Mother     Heart disease Father     Allergies Father     Diabetes Father     Migraines Father     Allergies Brother     Parkinsonism Maternal Grandmother     Stroke Maternal Grandmother     Arthritis Maternal Grandmother     Cancer Maternal Grandfather  Breast cancer Paternal Grandmother     Stroke Paternal Grandmother     Colon cancer Neg Hx

## 2024-07-08 ENCOUNTER — Ambulatory Visit: Admitting: Medical

## 2024-07-08 ENCOUNTER — Encounter: Payer: Self-pay | Admitting: Medical

## 2024-07-08 VITALS — BP 118/74 | HR 99 | Temp 98.1°F | Resp 15 | Ht 64.0 in | Wt 239.4 lb

## 2024-07-08 DIAGNOSIS — W57XXXA Bitten or stung by nonvenomous insect and other nonvenomous arthropods, initial encounter: Secondary | ICD-10-CM

## 2024-07-08 DIAGNOSIS — M2550 Pain in unspecified joint: Secondary | ICD-10-CM | POA: Diagnosis not present

## 2024-07-08 DIAGNOSIS — R197 Diarrhea, unspecified: Secondary | ICD-10-CM

## 2024-07-08 DIAGNOSIS — Z87440 Personal history of urinary (tract) infections: Secondary | ICD-10-CM | POA: Diagnosis not present

## 2024-07-08 DIAGNOSIS — S80269A Insect bite (nonvenomous), unspecified knee, initial encounter: Secondary | ICD-10-CM | POA: Diagnosis not present

## 2024-07-08 DIAGNOSIS — R300 Dysuria: Secondary | ICD-10-CM

## 2024-07-08 MED ORDER — AZELASTINE HCL 0.1 % NA SOLN: 2.0000 | Freq: Two times a day (BID) | NASAL | 0 refills | Status: AC

## 2024-07-08 MED ORDER — FLUCONAZOLE 150 MG PO TABS: ORAL_TABLET | ORAL | 0 refills | Status: AC

## 2024-07-08 NOTE — Patient Instructions (Addendum)
 Recurrent urinary tract infection(possible) Persistent symptoms despite antibiotics. Urinalysis indicated possible dehydration. Culture from January 24th showed no uropathogens. Differential includes interstitial cystitis if culture remains negative. - Ordered urine culture to confirm current infection status.(last culture result came back same day?) - Advised hydration. - Will consider interstitial cystitis if culture remains negative. -- Provided Diflucan  for potential yeast infection if symptoms develop.  Diarrhea secondary to antibiotic use Diarrhea recurred with cefdinir , likely antibiotic side effect. Concern for C. difficile due to recent antibiotic use. - Ordered stool kit for C. difficile, ova and parasite, and stool culture if diarrhea persists.   Chronic arthralgia and myalgia Chronic joint pain and muscle soreness. Differential includes autoimmune conditions and Lyme disease due to somewhat remote tick bite. - Ordered sed rate, C-reactive protein, ANA, CK, and rheumatoid factor. - Ordered Lyme and rmsf studies - Will consider referral to rheumatology based on lab results.  Tick bite of knee this summer. Hx of intermittent bites in past.(cardiologist recommend tick bite studies) Consideration of Lyme disease as a differential diagnosis for chronic arthralgia and myalgia. - Ordered Lyme antibody study. - Documented tick bite location for insurance purposes.  Nasal congestion noted at end of visit(maybe vasomotor rhinitis) -pt states flonase  makes her more congested -astelin  nasal spray.   Follow up date to be determined after lab review   At end of visit pt brought up gyn visit visit. Asked her to get me copy of that note as on review I don't have that note.

## 2024-07-08 NOTE — Telephone Encounter (Signed)
 You have clinical features do not fit in with histoplasma

## 2024-07-09 ENCOUNTER — Ambulatory Visit: Payer: Self-pay | Admitting: Medical

## 2024-07-10 LAB — URINE CULTURE
MICRO NUMBER:: 17532412
Result:: NO GROWTH
SPECIMEN QUALITY:: ADEQUATE

## 2024-07-11 MED ORDER — METHYLPREDNISOLONE 4 MG PO TABS: ORAL_TABLET | ORAL | 0 refills | Status: AC

## 2024-07-11 NOTE — Addendum Note (Signed)
 Addended by: DORINA DALLAS DORINA PA-C M on: 07/11/2024 07:45 PM   Modules accepted: Orders

## 2024-07-12 ENCOUNTER — Telehealth: Payer: Self-pay

## 2024-07-12 ENCOUNTER — Ambulatory Visit: Admitting: Cardiology

## 2024-07-13 ENCOUNTER — Telehealth: Payer: Self-pay

## 2024-07-13 NOTE — Telephone Encounter (Unsigned)
 Copied from CRM #[id number]. Topic: Clinical - Medication Question >> Jul 12, 2024  4:45 PM Celestine FALCON wrote: Reason for CRM: Pt is requesting that Dr. Geronimo put in a request to her preferred pharmacy for medication for her nebulizer machine received from urgent care. Pt stated she currently is on her second box of DuoNeb 3mg  3x a day that is helping her.  Pt also mentioned she uses samples of Breztri aerosphrere and would like to begin obtaining a prescription for that inhaler.   Pt's preferred pharmacy: [id number] [address], [place] - [address] [facility] [address] [facility] [address] [place] [address] Phone: [phone] Fax: [phone] Hours: Not open 24 hours  Pt's phone number: [phone] ok to leave a vm.

## 2024-07-14 ENCOUNTER — Ambulatory Visit (HOSPITAL_BASED_OUTPATIENT_CLINIC_OR_DEPARTMENT_OTHER)
Admission: RE | Admit: 2024-07-14 | Discharge: 2024-07-14 | Disposition: A | Source: Ambulatory Visit | Attending: Medical | Admitting: Medical

## 2024-07-14 ENCOUNTER — Other Ambulatory Visit: Payer: Self-pay | Admitting: Medical

## 2024-07-14 ENCOUNTER — Ambulatory Visit: Admitting: Medical

## 2024-07-14 ENCOUNTER — Telehealth: Payer: Self-pay | Admitting: Medical

## 2024-07-14 ENCOUNTER — Ambulatory Visit: Payer: Self-pay | Admitting: Medical

## 2024-07-14 VITALS — BP 132/84 | HR 100 | Temp 98.3°F | Resp 16 | Ht 64.0 in | Wt 237.0 lb

## 2024-07-14 DIAGNOSIS — N644 Mastodynia: Secondary | ICD-10-CM

## 2024-07-14 DIAGNOSIS — R591 Generalized enlarged lymph nodes: Secondary | ICD-10-CM

## 2024-07-14 DIAGNOSIS — N6311 Unspecified lump in the right breast, upper outer quadrant: Secondary | ICD-10-CM

## 2024-07-14 DIAGNOSIS — J029 Acute pharyngitis, unspecified: Secondary | ICD-10-CM

## 2024-07-14 DIAGNOSIS — M79601 Pain in right arm: Secondary | ICD-10-CM

## 2024-07-14 LAB — SEDIMENTATION RATE: Sed Rate: 51 mm/h — ABNORMAL HIGH (ref 0–30)

## 2024-07-14 LAB — B. BURGDORFI ANTIBODIES: B burgdorferi Ab IgG+IgM: <0.9 {index}

## 2024-07-14 LAB — CK: Total CK: 46 U/L (ref 21–240)

## 2024-07-14 LAB — ROCKY MTN SPOTTED FVR ABS PNL(IGG+IGM)
RMSF IgG: NOT DETECTED
RMSF IgM: NOT DETECTED

## 2024-07-14 LAB — ANA: Anti Nuclear Antibody (ANA): NEGATIVE

## 2024-07-14 LAB — RHEUMATOID FACTOR: Rheumatoid fact SerPl-aCnc: <10 [IU]/mL

## 2024-07-14 LAB — C-REACTIVE PROTEIN: CRP: 18.8 mg/L — ABNORMAL HIGH

## 2024-07-14 LAB — POCT RAPID STREP A (OFFICE): Rapid Strep A Screen: NEGATIVE

## 2024-07-14 MED ORDER — CYCLOBENZAPRINE HCL 5 MG PO TABS: ORAL_TABLET | ORAL | 0 refills | Status: AC

## 2024-07-14 NOTE — Patient Instructions (Addendum)
 Right breast lump with pain, upper outer quadrant Persistent pain with palpable lump. Previous imaging negative for malignancy. Differential includes lipoma or mass. - Ordered diagnostic mammogram for evaluation. - Instructed to schedule with radiology within a week. - Advised to contact if not contacted by radiology by early next week.  Right submandibular lymphadenopathy with pharyngitis by exam. Tender lymphadenopathy with pharyngitis. Rapid strep negative, culture pending.  - Ordered soft tissue neck ultrasound. to evaluate area in question. - CBC - Await throat culture results for antibiotic needed. - Advised to report worsening throat symptoms.  Right upper extremity pain. Intermittent pain in medial bicep and axillary region. Differential includes DVT or soft tissue pathology. - Ordered right upper extremity ultrasound to rule out DVT. - Advised to report worsening symptoms.  General Health Maintenance Routine health maintenance discussed. - Continue routine mammogram screening in 6-8 months. - Follow up with rheumatology in April.  Follow up 2 weeks or sooner if needed

## 2024-07-14 NOTE — Telephone Encounter (Signed)
 Dr Timmy,   I just put in a referral today. Below is portion of referral request. Can pt be seen by early next week. Report states finding likely reactive though no regional heent infection. Appreciate you help. Concern the wording will of final portion will worry pt and she was already expressing concern.  Thanks,  Dallas Maxwell, PA-C   IMPRESSION: Several prominent and 1 borderline enlarged lymph node noted along the right cervical chain in the region of clinical symptoms. Given tenderness, findings are favored to reflect reactive lymphadenopathy.   Metastatic lymphadenopathy or lymphoma are significantly less likely. Recommend continued clinical surveillance. If there is evidence of enlargement or persistence over time, repeat imaging and potentially soft tissue sampling may become warranted.

## 2024-07-14 NOTE — Progress Notes (Signed)
 "  Subjective:    Patient ID: Lorraine Conrad, female    DOB: Apr 25, 1971, 54 y.o.   MRN: [id number]  HPI Discussed the use of AI scribe software for clinical note transcription with the patient, who gave verbal consent to proceed.  History of Present Illness   Lorraine Conrad is a 54 year old female who presents with persistent recurrent right axillary and new rt side rt submandibular area and some rt side anterior neck pain.  She has persistent right axillary pain that began as excruciating breast pain before she saw gyn recently.  Pt described as feeling like it had been ripped. The pain intensity has decreased but remains, radiating to the medial bicep and associated with tenderness under the arm. She can now wear a bra with less discomfort. A prior antibiotic course did resolve the axillary tenderness per gyn note though patient states it did not resolve former described pain.  She has new right-sided neck pain that she associates with rt submandibular node lymph node tenderness, starting about three to four days ago and described as moderate at night. Swallowing worsens the pain, though there is no throat pain she states rt side submandibular node region hurts on swallowing. She notes no head tightness, no ear drainage, but some nasal congestion. She started a Medrol  pack yesterday with improvement in neck pain after one day.  At a recent gynecologic visit for this right axillary pain, she reported ongoing symptoms. A mammogram on March 31, 2024, showed no evidence of malignancy with recommendation for routine annual follow-up.  She has no tooth pain, no throat pain when swallowing, and no nipple discharge or color changes.       Past Medical History:  Diagnosis Date   Allergy    SEASONAL   Anemia    Arthritis    spine (03/01/2014)   Asthma    Chronic lower back pain    Endometriosis    Erosive gastritis    External hemorrhoids    GERD (gastroesophageal reflux disease)    H/O  shortness of breath 2014   Cardiopulmonary exercise test results   Headache    probably monthly (03/01/2014)   Migraine 1998; 02/2014   this one's lasted 10 days straight (03/01/2014)   OSA on CPAP    Ovarian cyst, left    Proctalgia fugax    Sleep apnea    on cpap   Thyroid  disease    thyroid  nodule     Social History   Socioeconomic History   Marital status: Married    Spouse name: Not on file   Number of children: 2   Years of education: Not on file   Highest education level: Not on file  Occupational History   Occupation: homemaker  Tobacco Use   Smoking status: Never   Smokeless tobacco: Never   Tobacco comments:    father smoked in house growing up per pt.   Vaping Use   Vaping status: Never Used  Substance and Sexual Activity   Alcohol use: Yes    Alcohol/week: 0.0 standard drinks of alcohol    Comment: 12-23-16 I'll have 1-2 drinks maybe 3-4 times/yr   Drug use: No   Sexual activity: Yes    Birth control/protection: Surgical  Other Topics Concern   Not on file  Social History Narrative   Daily caffiene use: 6 cups per day   Patient does not get regular excercise   Social Drivers of Health   Tobacco Use: Low Risk (  07/08/2024)   Patient History    Smoking Tobacco Use: Never    Smokeless Tobacco Use: Never    Passive Exposure: Not on file  Financial Resource Strain: Low Risk (09/23/2022)   Received from [facility]   Overall Financial Resource Strain (CARDIA)    Difficulty of Paying Living Expenses: Not hard at all  Food Insecurity: No Food Insecurity (09/23/2022)   Received from [facility]   Epic    Within the past 12 months, you worried that your food would run out before you got the money to buy more.: Never true    Within the past 12 months, the food you bought just didn't last and you didn't have money to get more.: Never true  Transportation Needs: No Transportation Needs (09/23/2022)   Received from [facility] - Transportation     Lack of Transportation (Medical): No    Lack of Transportation (Non-Medical): No  Physical Activity: Not on file  Stress: Not on file  Social Connections: Unknown (10/18/2021)   Received from [facility]   Social Network    Social Network: Not on file  Intimate Partner Violence: Unknown (09/09/2021)   Received from [facility]   HITS    Physically Hurt: Not on file    Insult or Talk Down To: Not on file    Threaten Physical Harm: Not on file    Scream or Curse: Not on file  Depression (PHQ2-9): Low Risk (07/14/2024)   Depression (PHQ2-9)    PHQ-2 Score: 0  Alcohol Screen: Not on file  Housing: Low Risk (06/24/2024)   Received from [facility]   Epic    What is your living situation today?: I have a steady place to live    Think about the place you live. Do you have problems with any of the following? Choose all that apply:: Not on file  Utilities: Not At Risk (09/23/2022)   Received from [facility] Utilities    Threatened with loss of utilities: No  Health Literacy: Not on file    Past Surgical History:  Procedure Laterality Date   ABDOMINAL HYSTERECTOMY  04/2013   BREAST BIOPSY Bilateral 10/05/2000   BREAST BIOPSY Right 08/26/2019   Fibroadenoma   BREAST EXCISIONAL BIOPSY Left 1991   CESAREAN SECTION  2002; 2006   COLONOSCOPY     LAPAROSCOPIC OVARIAN CYSTECTOMY  ~ 1999   NASAL SEPTUM SURGERY  ~ 1991   OOPHORECTOMY Right 2015   TEMPOROMANDIBULAR JOINT SURGERY  ~ 1992   UPPER GASTROINTESTINAL ENDOSCOPY      Family History  Problem Relation Age of Onset   Diverticulitis Mother    Colon polyps Father    Heart disease Father    Colon polyps Paternal Grandmother    Breast cancer Paternal Grandmother    Prostate cancer Paternal Grandfather    Colon polyps Paternal Grandfather    Colon cancer Neg Hx    Esophageal cancer Neg Hx    Rectal cancer Neg Hx    Stomach cancer Neg Hx    Pancreatic cancer Neg Hx     Allergies[1]  Medications Ordered Prior  to Encounter[2]  BP 132/84 (BP Location: Left Arm, Patient Position: Sitting)   Pulse 100   Temp 98.3 F (36.8 C) (Oral)   Resp 16   Ht 5' 4 (1.626 m)   Wt 237 lb (107.5 kg)   LMP 02/08/2013   SpO2 96%   BMI 40.68 kg/m  Review of Systems  Constitutional:  Negative for chills and fever.  HENT:  Negative for congestion.   Respiratory:  Negative for chest tightness, shortness of breath and wheezing.   Cardiovascular:  Negative for chest pain and palpitations.  Gastrointestinal:  Negative for abdominal pain, blood in stool, nausea and vomiting.  Musculoskeletal:  Negative for back pain.       See hpi  Skin:  Negative for rash.  Neurological:  Negative for dizziness, syncope, weakness and light-headedness.  Hematological:  Positive for adenopathy.  Psychiatric/Behavioral:  Negative for behavioral problems and dysphoric mood.    Past Medical History:  Diagnosis Date   Allergy    SEASONAL   Anemia    Arthritis    spine (03/01/2014)   Asthma    Chronic lower back pain    Endometriosis    Erosive gastritis    External hemorrhoids    GERD (gastroesophageal reflux disease)    H/O shortness of breath 2014   Cardiopulmonary exercise test results   Headache    probably monthly (03/01/2014)   Migraine 1998; 02/2014   this one's lasted 10 days straight (03/01/2014)   OSA on CPAP    Ovarian cyst, left    Proctalgia fugax    Sleep apnea    on cpap   Thyroid  disease    thyroid  nodule     Social History   Socioeconomic History   Marital status: Married    Spouse name: Not on file   Number of children: 2   Years of education: Not on file   Highest education level: Not on file  Occupational History   Occupation: homemaker  Tobacco Use   Smoking status: Never   Smokeless tobacco: Never   Tobacco comments:    father smoked in house growing up per pt.   Vaping Use   Vaping status: Never Used  Substance and Sexual Activity   Alcohol use: Yes    Alcohol/week:  0.0 standard drinks of alcohol    Comment: 12-23-16 I'll have 1-2 drinks maybe 3-4 times/yr   Drug use: No   Sexual activity: Yes    Birth control/protection: Surgical  Other Topics Concern   Not on file  Social History Narrative   Daily caffiene use: 6 cups per day   Patient does not get regular excercise   Social Drivers of Health   Tobacco Use: Low Risk (07/08/2024)   Patient History    Smoking Tobacco Use: Never    Smokeless Tobacco Use: Never    Passive Exposure: Not on file  Financial Resource Strain: Low Risk (09/23/2022)   Received from [facility]   Overall Financial Resource Strain (CARDIA)    Difficulty of Paying Living Expenses: Not hard at all  Food Insecurity: No Food Insecurity (09/23/2022)   Received from [facility]   Epic    Within the past 12 months, you worried that your food would run out before you got the money to buy more.: Never true    Within the past 12 months, the food you bought just didn't last and you didn't have money to get more.: Never true  Transportation Needs: No Transportation Needs (09/23/2022)   Received from [facility] - Transportation    Lack of Transportation (Medical): No    Lack of Transportation (Non-Medical): No  Physical Activity: Not on file  Stress: Not on file  Social Connections: Unknown (10/18/2021)   Received from [facility]   Social Network  Social Network: Not on file  Intimate Partner Violence: Unknown (09/09/2021)   Received from [facility]   HITS    Physically Hurt: Not on file    Insult or Talk Down To: Not on file    Threaten Physical Harm: Not on file    Scream or Curse: Not on file  Depression (PHQ2-9): Low Risk (07/14/2024)   Depression (PHQ2-9)    PHQ-2 Score: 0  Alcohol Screen: Not on file  Housing: Low Risk (06/24/2024)   Received from [facility]   Epic    What is your living situation today?: I have a steady place to live    Think about the place you live. Do you have problems  with any of the following? Choose all that apply:: Not on file  Utilities: Not At Risk (09/23/2022)   Received from [facility] Utilities    Threatened with loss of utilities: No  Health Literacy: Not on file    Past Surgical History:  Procedure Laterality Date   ABDOMINAL HYSTERECTOMY  04/2013   BREAST BIOPSY Bilateral 10/05/2000   BREAST BIOPSY Right 08/26/2019   Fibroadenoma   BREAST EXCISIONAL BIOPSY Left 1991   CESAREAN SECTION  2002; 2006   COLONOSCOPY     LAPAROSCOPIC OVARIAN CYSTECTOMY  ~ 1999   NASAL SEPTUM SURGERY  ~ 1991   OOPHORECTOMY Right 2015   TEMPOROMANDIBULAR JOINT SURGERY  ~ 1992   UPPER GASTROINTESTINAL ENDOSCOPY      Family History  Problem Relation Age of Onset   Diverticulitis Mother    Colon polyps Father    Heart disease Father    Colon polyps Paternal Grandmother    Breast cancer Paternal Grandmother    Prostate cancer Paternal Grandfather    Colon polyps Paternal Grandfather    Colon cancer Neg Hx    Esophageal cancer Neg Hx    Rectal cancer Neg Hx    Stomach cancer Neg Hx    Pancreatic cancer Neg Hx     Allergies[3]  Medications Ordered Prior to Encounter[4]  BP 132/84 (BP Location: Left Arm, Patient Position: Sitting)   Pulse 100   Temp 98.3 F (36.8 C) (Oral)   Resp 16   Ht 5' 4 (1.626 m)   Wt 237 lb (107.5 kg)   LMP 02/08/2013   SpO2 96%   BMI 40.68 kg/m         Objective:   Physical Exam  General Mental Status- Alert. General Appearance- Not in acute distress.   Skin General: Color- Normal Color. Moisture- Normal Moisture.  Neck Rt side neck faint tender rt side. No JVD.  Heent- fullness and tenderness rt submandibular node. Lymph node feels mild enlarged.  Chest and Lung Exam Auscultation: Breath Sounds:-Normal.  Cardiovascular Auscultation:Rythm- Regular. Murmurs & Other Heart Sounds:Auscultation of the heart reveals- No Murmurs.  Abdomen Inspection:-Inspeection  Normal. Palpation/Percussion:Note:No mass. Palpation and Percussion of the abdomen reveal- Non Tender, Non Distended + BS, no rebound or guarding.   Neurologic Cranial Nerve exam:- CN III-XII intact(No nystagmus), symmetric smile. Strength:- 5/5 equal and symmetric strength both upper and lower extremities.   Rt upper ext- arm does not feel swollen. No redness. Mild tenderness medial  aspect of bicep.   Breast exam with chaperone Levorn Friday- breast symmetric. Rt upper outer quadrant lump vs lipoma. Normal coloration to breast. No nipple dc. Axillary area no palpable lymph nodes.    Assessment & Plan:   Patient Instructions  Right breast lump with pain, upper  outer quadrant Persistent pain with palpable lump. Previous imaging negative for malignancy. Differential includes lipoma or mass. - Ordered diagnostic mammogram for evaluation. - Instructed to schedule with radiology within a week. - Advised to contact if not contacted by radiology by early next week.  Right submandibular lymphadenopathy with pharyngitis by exam. Tender lymphadenopathy with pharyngitis. Rapid strep negative, culture pending.  - Ordered soft tissue neck ultrasound. to evaluate area in question. - CBC - Await throat culture results for antibiotic needed. - Advised to report worsening throat symptoms.  Right upper extremity pain. Intermittent pain in medial bicep and axillary region. Differential includes DVT or soft tissue pathology. - Ordered right upper extremity ultrasound to rule out DVT. - Advised to report worsening symptoms.  General Health Maintenance Routine health maintenance discussed. - Continue routine mammogram screening in 6-8 months. - Follow up with rheumatology in April.  Follow up 2 weeks or sooner if needed   Dallas Maxwell, PA-C    I personally spent a total of 55 minutes in the care of the patient today including performing a medically appropriate exam/evaluation, counseling and  educating, referring and communicating with other health care professionals, and documenting clinical information in the EHR. Referral to specialist after US  soft tissue neck resulted.    [1]  Allergies Allergen Reactions   Promethazine Hcl Other (See Comments)    Violent tremors    Azithromycin Rash    Head to toe rash   Cephalexin  Diarrhea and Itching   Codeine Other (See Comments)    Was a child when she took this.   Dilaudid [Hydromorphone] Nausea And Vomiting   Soy Allergy (Obsolete) Itching  [2]  Current Outpatient Medications on File Prior to Visit  Medication Sig Dispense Refill   acetaminophen  (TYLENOL ) 500 MG tablet Take 500-1,000 mg by mouth every 6 (six) hours as needed for moderate pain (pain score 4-6).     albuterol  (VENTOLIN  HFA) 108 (90 Base) MCG/ACT inhaler Inhale 2 puffs into the lungs every 6 (six) hours as needed for wheezing or shortness of breath. 18 g 2   aspirin  EC 81 MG tablet Take 1 tablet (81 mg total) by mouth daily. Swallow whole. (Patient not taking: Reported on 07/08/2024)     atorvastatin  (LIPITOR) 10 MG tablet TAKE 1 TABLET BY MOUTH EVERY DAY (Patient not taking: Reported on 07/08/2024) 30 tablet 8   azelastine  (ASTELIN ) 0.1 % nasal spray Place 2 sprays into both nostrils 2 (two) times daily. Use in each nostril as directed 30 mL 0   benzonatate  (TESSALON ) 100 MG capsule Take 2 capsules (200 mg total) by mouth 3 (three) times daily as needed for cough. 30 capsule 1   budesonide -formoterol  (SYMBICORT ) 160-4.5 MCG/ACT inhaler Inhale 2 puffs into the lungs 2 (two) times daily. (Patient not taking: Reported on 07/08/2024) 1 each 12   cyclobenzaprine  (FLEXERIL ) 5 MG tablet Take 1 tablet (5 mg total) by mouth at bedtime. 3 tablet 0   cyclobenzaprine  (FLEXERIL ) 5 MG tablet 1 tab po q hs prn muscle spasms 5 tablet 0   diclofenac  (VOLTAREN ) 75 MG EC tablet Take 1 tablet (75 mg total) by mouth 2 (two) times daily. (Patient not taking: Reported on 07/08/2024) 20 tablet  1   fluconazole  (DIFLUCAN ) 150 MG tablet 1 tab po x 1 dose if needed yeast infection 1 tablet 0   fluticasone  (FLONASE ) 50 MCG/ACT nasal spray SPRAY 2 SPRAYS INTO EACH NOSTRIL EVERY DAY (Patient not taking: Reported on 07/08/2024) 16 mL 5   ipratropium-albuterol  (  DUONEB) 0.5-2.5 (3) MG/3ML SOLN Inhale 3 mLs into the lungs.     levocetirizine (XYZAL ) 5 MG tablet Take 5 mg by mouth daily as needed for allergies.     methylPREDNISolone  (MEDROL ) 4 MG tablet Standard 6 day taper dose pack 21 tablet 0   montelukast  (SINGULAIR ) 10 MG tablet Take 10 mg by mouth at bedtime.     omeprazole  (PRILOSEC) 40 MG capsule Take 40 mg by mouth.     ondansetron  (ZOFRAN ) 4 MG tablet Take 1 tablet (4 mg total) by mouth every 8 (eight) hours as needed for nausea or vomiting. 20 tablet 0   SUMAtriptan  (IMITREX ) 50 MG tablet Take 1 tablet (50 mg total) by mouth every 2 (two) hours as needed for migraine. May repeat in 2 hours if headache persists or recurs. 10 tablet 0   vitamin B-12 (CYANOCOBALAMIN ) 100 MCG tablet Take 100 mcg by mouth daily. (Patient not taking: Reported on 07/08/2024)     No current facility-administered medications on file prior to visit.  [3]  Allergies Allergen Reactions   Promethazine Hcl Other (See Comments)    Violent tremors    Azithromycin Rash    Head to toe rash   Cephalexin  Diarrhea and Itching   Codeine Other (See Comments)    Was a child when she took this.   Dilaudid [Hydromorphone] Nausea And Vomiting   Soy Allergy (Obsolete) Itching  [4]  Current Outpatient Medications on File Prior to Visit  Medication Sig Dispense Refill   acetaminophen  (TYLENOL ) 500 MG tablet Take 500-1,000 mg by mouth every 6 (six) hours as needed for moderate pain (pain score 4-6).     albuterol  (VENTOLIN  HFA) 108 (90 Base) MCG/ACT inhaler Inhale 2 puffs into the lungs every 6 (six) hours as needed for wheezing or shortness of breath. 18 g 2   aspirin  EC 81 MG tablet Take 1 tablet (81 mg total) by mouth  daily. Swallow whole. (Patient not taking: Reported on 07/08/2024)     atorvastatin  (LIPITOR) 10 MG tablet TAKE 1 TABLET BY MOUTH EVERY DAY (Patient not taking: Reported on 07/08/2024) 30 tablet 8   azelastine  (ASTELIN ) 0.1 % nasal spray Place 2 sprays into both nostrils 2 (two) times daily. Use in each nostril as directed 30 mL 0   benzonatate  (TESSALON ) 100 MG capsule Take 2 capsules (200 mg total) by mouth 3 (three) times daily as needed for cough. 30 capsule 1   budesonide -formoterol  (SYMBICORT ) 160-4.5 MCG/ACT inhaler Inhale 2 puffs into the lungs 2 (two) times daily. (Patient not taking: Reported on 07/08/2024) 1 each 12   cyclobenzaprine  (FLEXERIL ) 5 MG tablet Take 1 tablet (5 mg total) by mouth at bedtime. 3 tablet 0   cyclobenzaprine  (FLEXERIL ) 5 MG tablet 1 tab po q hs prn muscle spasms 5 tablet 0   diclofenac  (VOLTAREN ) 75 MG EC tablet Take 1 tablet (75 mg total) by mouth 2 (two) times daily. (Patient not taking: Reported on 07/08/2024) 20 tablet 1   fluconazole  (DIFLUCAN ) 150 MG tablet 1 tab po x 1 dose if needed yeast infection 1 tablet 0   fluticasone  (FLONASE ) 50 MCG/ACT nasal spray SPRAY 2 SPRAYS INTO EACH NOSTRIL EVERY DAY (Patient not taking: Reported on 07/08/2024) 16 mL 5   ipratropium-albuterol  (DUONEB) 0.5-2.5 (3) MG/3ML SOLN Inhale 3 mLs into the lungs.     levocetirizine (XYZAL ) 5 MG tablet Take 5 mg by mouth daily as needed for allergies.     methylPREDNISolone  (MEDROL ) 4 MG tablet Standard 6 day taper  dose pack 21 tablet 0   montelukast  (SINGULAIR ) 10 MG tablet Take 10 mg by mouth at bedtime.     omeprazole  (PRILOSEC) 40 MG capsule Take 40 mg by mouth.     ondansetron  (ZOFRAN ) 4 MG tablet Take 1 tablet (4 mg total) by mouth every 8 (eight) hours as needed for nausea or vomiting. 20 tablet 0   SUMAtriptan  (IMITREX ) 50 MG tablet Take 1 tablet (50 mg total) by mouth every 2 (two) hours as needed for migraine. May repeat in 2 hours if headache persists or recurs. 10 tablet 0    vitamin B-12 (CYANOCOBALAMIN ) 100 MCG tablet Take 100 mcg by mouth daily. (Patient not taking: Reported on 07/08/2024)     No current facility-administered medications on file prior to visit.   "

## 2024-07-18 ENCOUNTER — Inpatient Hospital Stay

## 2024-07-18 ENCOUNTER — Inpatient Hospital Stay: Admitting: Hematology & Oncology

## 2024-07-20 ENCOUNTER — Ambulatory Visit: Admitting: Family Medicine

## 2024-07-21 ENCOUNTER — Encounter

## 2024-07-21 ENCOUNTER — Other Ambulatory Visit

## 2024-07-27 ENCOUNTER — Other Ambulatory Visit (HOSPITAL_BASED_OUTPATIENT_CLINIC_OR_DEPARTMENT_OTHER)

## 2024-07-28 ENCOUNTER — Ambulatory Visit: Admitting: Adult Health

## 2024-07-29 ENCOUNTER — Ambulatory Visit: Admitting: Medical

## 2024-09-02 ENCOUNTER — Ambulatory Visit: Admitting: Cardiology

## 2024-10-03 ENCOUNTER — Ambulatory Visit: Admitting: Rheumatology

## 2024-10-25 ENCOUNTER — Ambulatory Visit: Admitting: Rheumatology
